# Patient Record
Sex: Male | Born: 1960 | Race: White | Hispanic: No | Smoking: Never smoker
Health system: Southern US, Community
[De-identification: ages and names within clinical notes are randomized; demographics above are authoritative.]

## PROBLEM LIST (undated history)

## (undated) DIAGNOSIS — E1165 Type 2 diabetes mellitus with hyperglycemia: Secondary | ICD-10-CM

## (undated) DIAGNOSIS — I10 Essential (primary) hypertension: Secondary | ICD-10-CM

## (undated) DIAGNOSIS — IMO0002 Reserved for concepts with insufficient information to code with codable children: Secondary | ICD-10-CM

## (undated) DIAGNOSIS — F22 Delusional disorders: Secondary | ICD-10-CM

---

## 2005-07-24 ENCOUNTER — Emergency Department (HOSPITAL_COMMUNITY): Admission: EM | Admit: 2005-07-24 | Discharge: 2005-07-24 | Payer: Self-pay | Admitting: Emergency Medicine

## 2005-07-26 ENCOUNTER — Emergency Department (HOSPITAL_COMMUNITY): Admission: EM | Admit: 2005-07-26 | Discharge: 2005-07-26 | Payer: Self-pay | Admitting: Family Medicine

## 2006-12-01 ENCOUNTER — Emergency Department (HOSPITAL_COMMUNITY): Admission: EM | Admit: 2006-12-01 | Discharge: 2006-12-01 | Payer: Self-pay | Admitting: Emergency Medicine

## 2007-11-29 ENCOUNTER — Emergency Department (HOSPITAL_COMMUNITY): Admission: EM | Admit: 2007-11-29 | Discharge: 2007-11-29 | Payer: Self-pay | Admitting: Emergency Medicine

## 2007-11-29 IMAGING — CR DG ANKLE COMPLETE 3+V*R*
3 series · 3 of 3 positions shown · non-contrast
Comparison: none

This report is delayed due to PACS failure.
CLINICAL DATA: Right ankle pain. No injury.
 RIGHT ANKLE - 3 VIEW:

[view not recorded (1 of 3)]
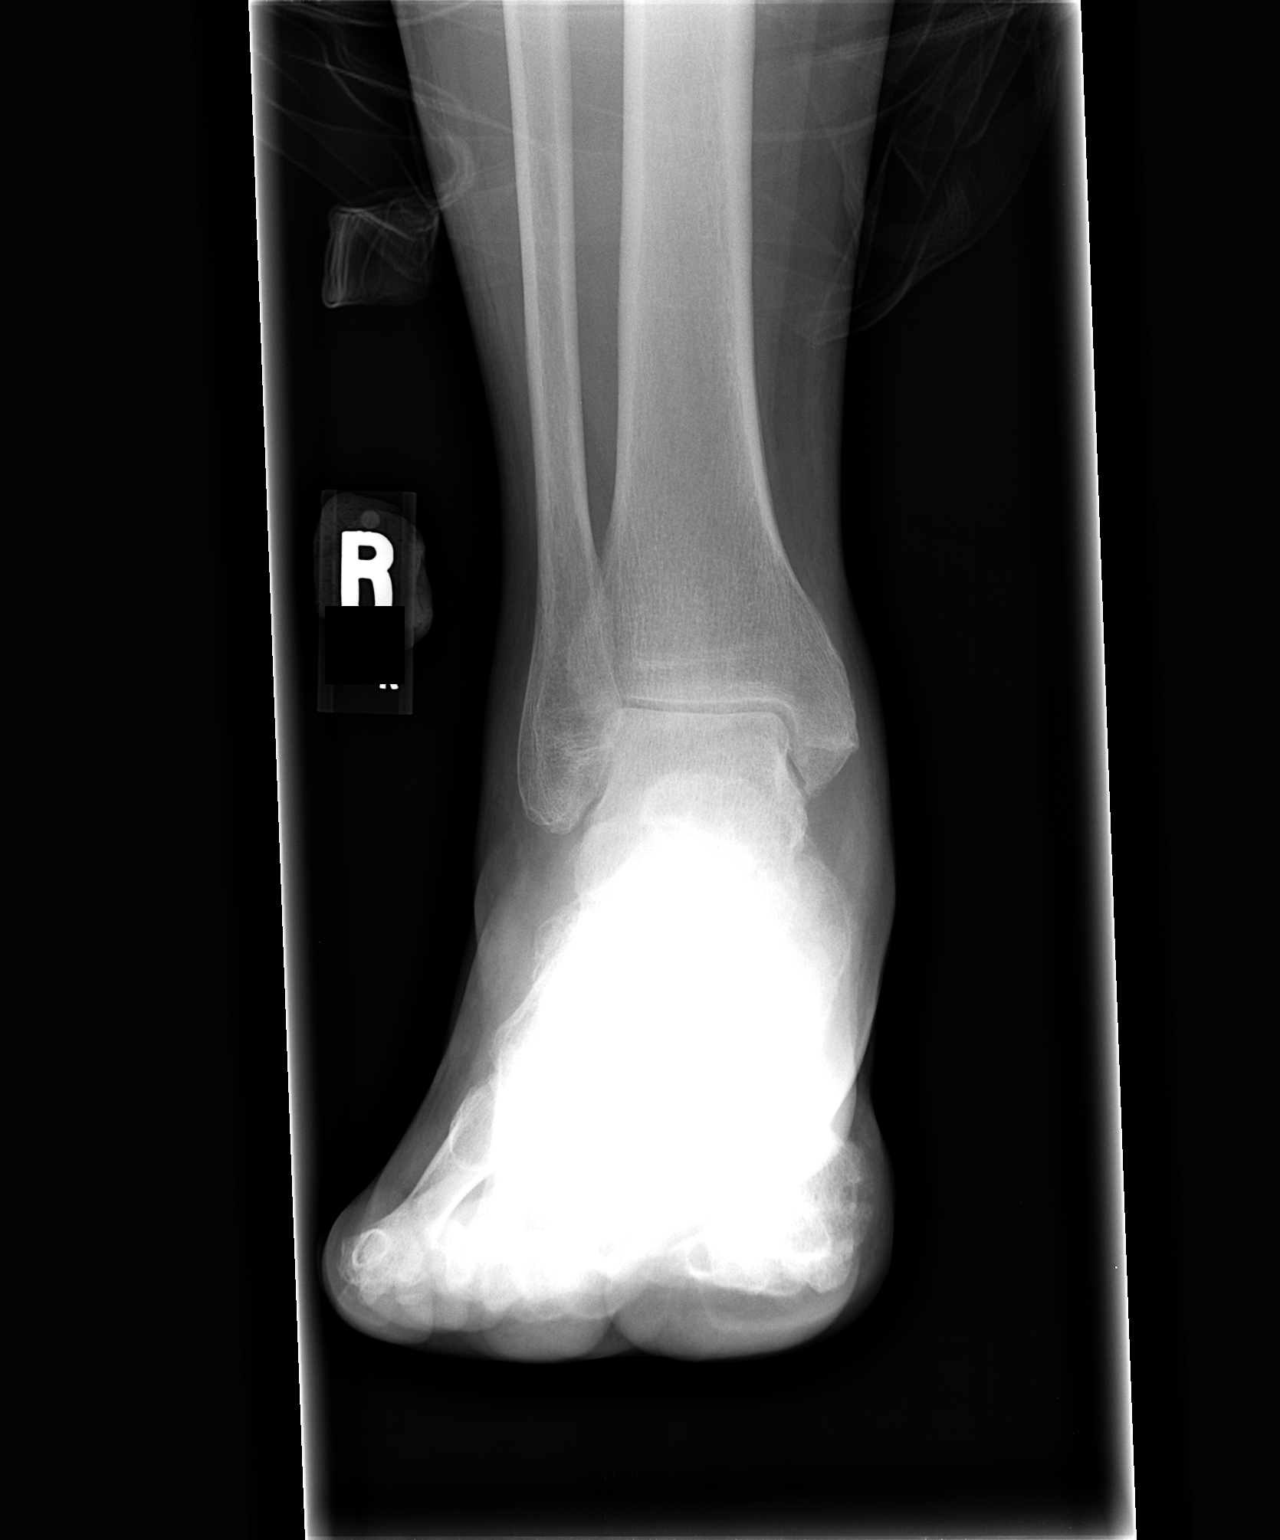

[view not recorded (2 of 3)]
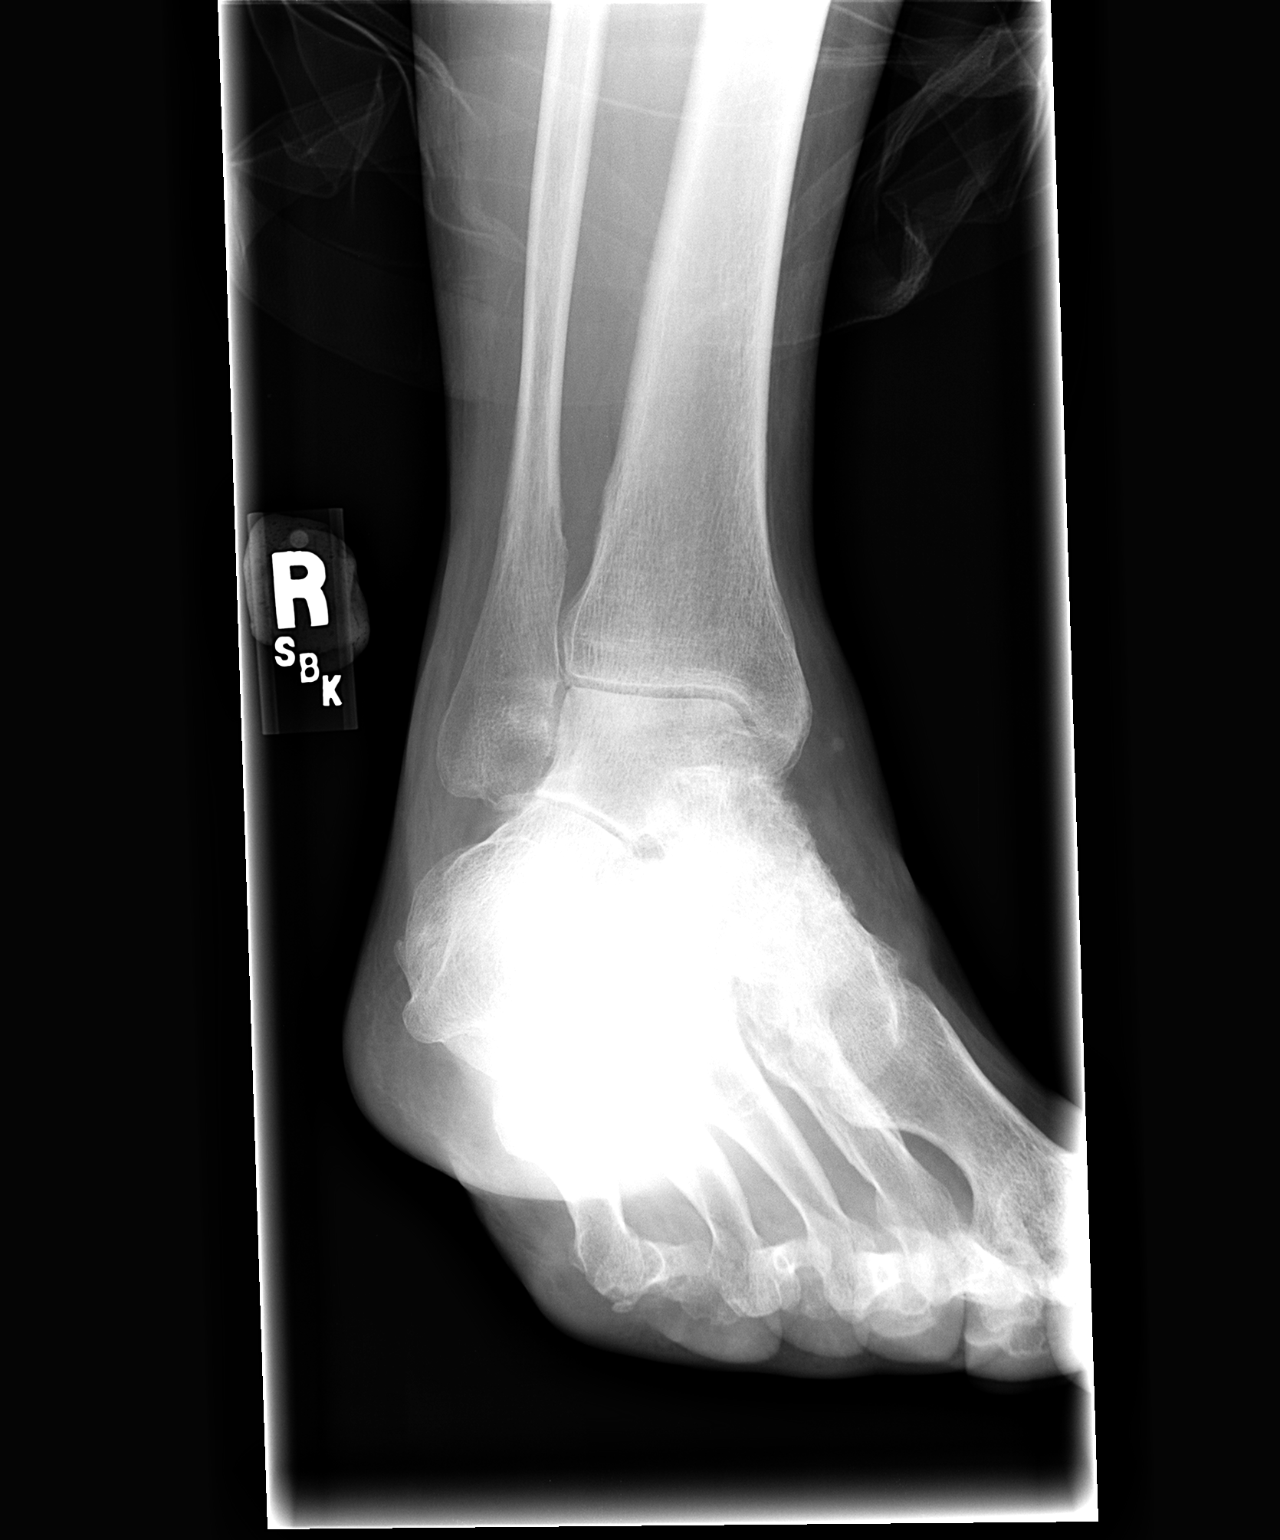

[view not recorded (3 of 3)]
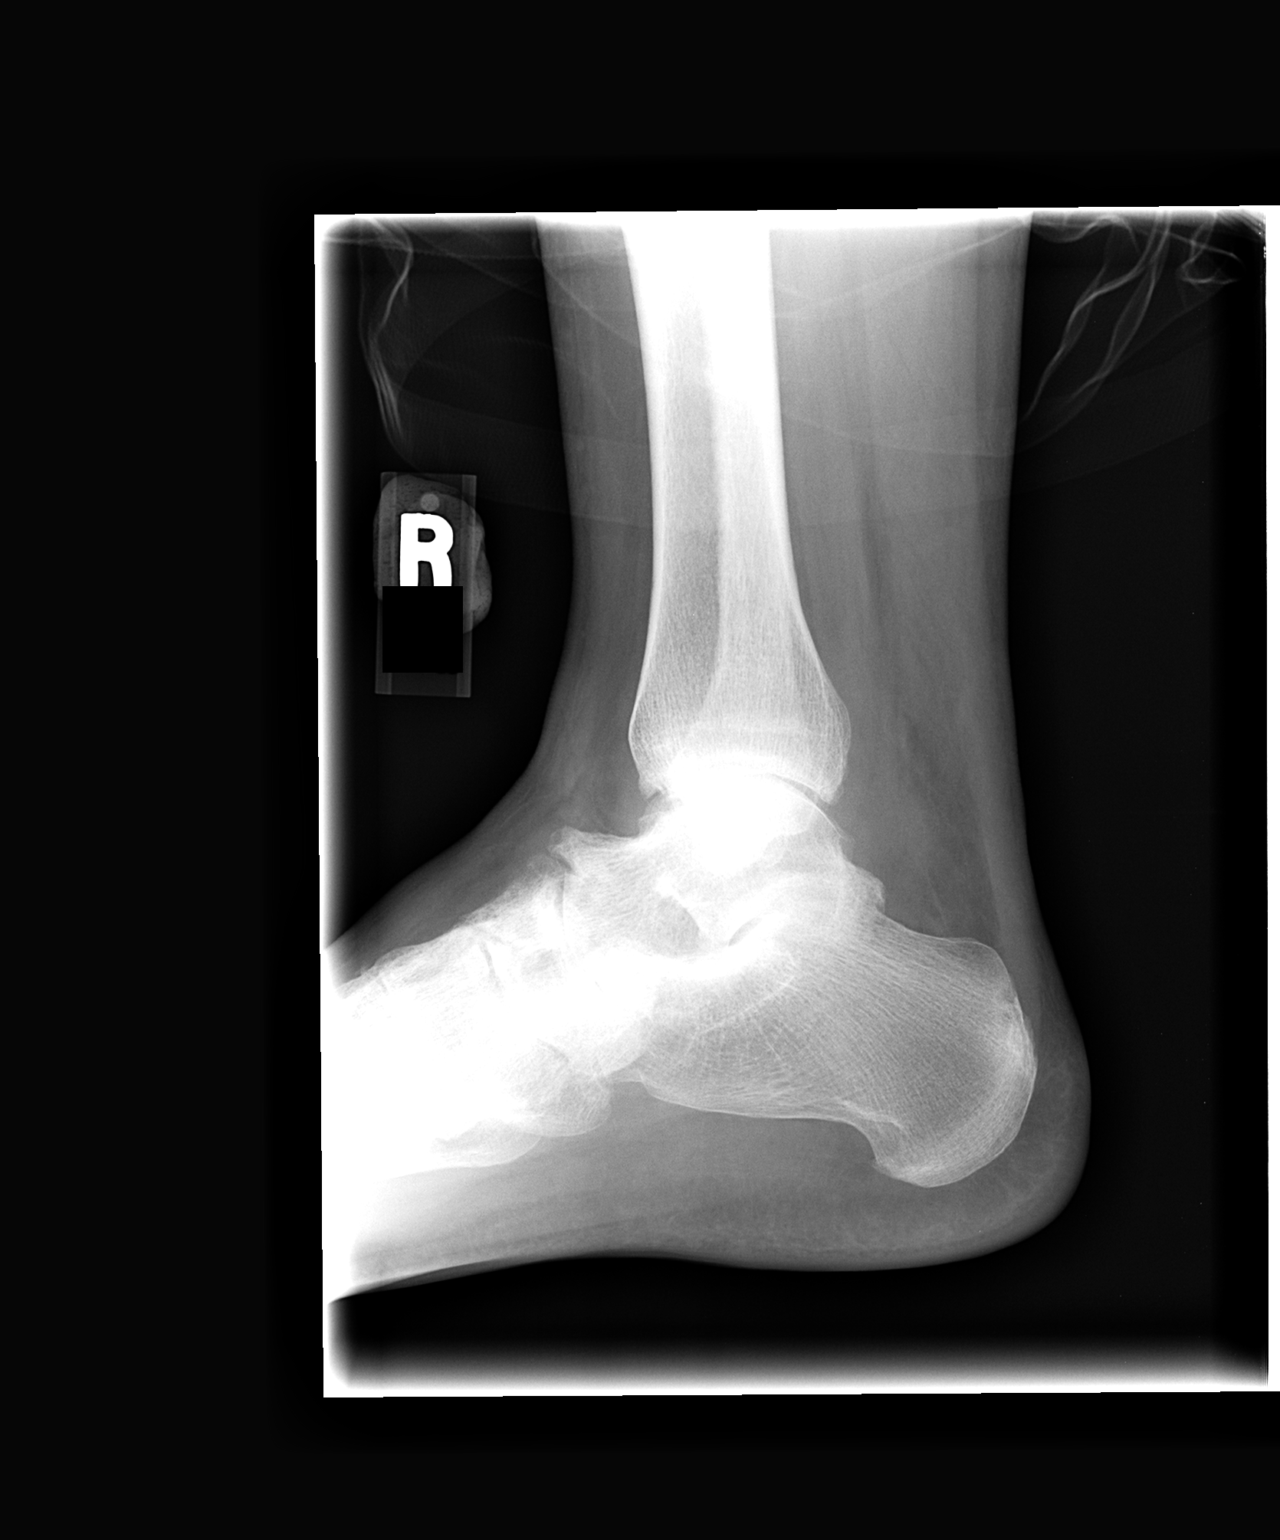

[3 of 3 positions shown; findings below may reference images not displayed]

FINDINGS: Three views of the right ankle were obtained.  The ankle joint appears normal. However, there is considerable degenerative change within the mid foot with spurring at the talonavicular articulation.
IMPRESSION: Degenerative change in mid foot. No acute abnormality.

## 2020-02-01 ENCOUNTER — Inpatient Hospital Stay (HOSPITAL_COMMUNITY): Payer: Non-veteran care

## 2020-02-01 ENCOUNTER — Other Ambulatory Visit: Payer: Self-pay

## 2020-02-01 ENCOUNTER — Emergency Department (HOSPITAL_COMMUNITY): Payer: Non-veteran care

## 2020-02-01 ENCOUNTER — Encounter (HOSPITAL_COMMUNITY): Payer: Self-pay

## 2020-02-01 ENCOUNTER — Inpatient Hospital Stay (HOSPITAL_COMMUNITY)
Admission: EM | Admit: 2020-02-01 | Discharge: 2020-03-01 | DRG: 853 | Disposition: A | Discharge status: Skilled Nursing Facility | Payer: Non-veteran care | Attending: Internal Medicine | Admitting: Internal Medicine

## 2020-02-01 DIAGNOSIS — A4101 Sepsis due to Methicillin susceptible Staphylococcus aureus: Principal | ICD-10-CM | POA: Diagnosis present

## 2020-02-01 DIAGNOSIS — R531 Weakness: Secondary | ICD-10-CM

## 2020-02-01 DIAGNOSIS — R627 Adult failure to thrive: Secondary | ICD-10-CM | POA: Diagnosis present

## 2020-02-01 DIAGNOSIS — S60512A Abrasion of left hand, initial encounter: Secondary | ICD-10-CM | POA: Diagnosis present

## 2020-02-01 DIAGNOSIS — E1142 Type 2 diabetes mellitus with diabetic polyneuropathy: Secondary | ICD-10-CM | POA: Diagnosis present

## 2020-02-01 DIAGNOSIS — N170 Acute kidney failure with tubular necrosis: Secondary | ICD-10-CM | POA: Diagnosis present

## 2020-02-01 DIAGNOSIS — R339 Retention of urine, unspecified: Secondary | ICD-10-CM | POA: Diagnosis not present

## 2020-02-01 DIAGNOSIS — R296 Repeated falls: Secondary | ICD-10-CM | POA: Diagnosis not present

## 2020-02-01 DIAGNOSIS — F40240 Claustrophobia: Secondary | ICD-10-CM | POA: Diagnosis present

## 2020-02-01 DIAGNOSIS — A419 Sepsis, unspecified organism: Secondary | ICD-10-CM | POA: Diagnosis not present

## 2020-02-01 DIAGNOSIS — E43 Unspecified severe protein-calorie malnutrition: Secondary | ICD-10-CM | POA: Diagnosis present

## 2020-02-01 DIAGNOSIS — L97429 Non-pressure chronic ulcer of left heel and midfoot with unspecified severity: Secondary | ICD-10-CM | POA: Diagnosis present

## 2020-02-01 DIAGNOSIS — E871 Hypo-osmolality and hyponatremia: Secondary | ICD-10-CM | POA: Diagnosis present

## 2020-02-01 DIAGNOSIS — N390 Urinary tract infection, site not specified: Secondary | ICD-10-CM | POA: Diagnosis present

## 2020-02-01 DIAGNOSIS — M4642 Discitis, unspecified, cervical region: Secondary | ICD-10-CM | POA: Diagnosis not present

## 2020-02-01 DIAGNOSIS — M20009 Unspecified deformity of unspecified finger(s): Secondary | ICD-10-CM

## 2020-02-01 DIAGNOSIS — Y92009 Unspecified place in unspecified non-institutional (private) residence as the place of occurrence of the external cause: Secondary | ICD-10-CM | POA: Diagnosis not present

## 2020-02-01 DIAGNOSIS — F22 Delusional disorders: Secondary | ICD-10-CM | POA: Diagnosis present

## 2020-02-01 DIAGNOSIS — L89152 Pressure ulcer of sacral region, stage 2: Secondary | ICD-10-CM | POA: Diagnosis not present

## 2020-02-01 DIAGNOSIS — M86172 Other acute osteomyelitis, left ankle and foot: Secondary | ICD-10-CM | POA: Diagnosis present

## 2020-02-01 DIAGNOSIS — R7881 Bacteremia: Secondary | ICD-10-CM | POA: Diagnosis not present

## 2020-02-01 DIAGNOSIS — I700 Atherosclerosis of aorta: Secondary | ICD-10-CM | POA: Diagnosis present

## 2020-02-01 DIAGNOSIS — I081 Rheumatic disorders of both mitral and tricuspid valves: Secondary | ICD-10-CM | POA: Diagnosis present

## 2020-02-01 DIAGNOSIS — M4802 Spinal stenosis, cervical region: Secondary | ICD-10-CM | POA: Diagnosis present

## 2020-02-01 DIAGNOSIS — M6008 Infective myositis, other site: Secondary | ICD-10-CM | POA: Diagnosis present

## 2020-02-01 DIAGNOSIS — D509 Iron deficiency anemia, unspecified: Secondary | ICD-10-CM | POA: Diagnosis present

## 2020-02-01 DIAGNOSIS — L97419 Non-pressure chronic ulcer of right heel and midfoot with unspecified severity: Secondary | ICD-10-CM | POA: Diagnosis present

## 2020-02-01 DIAGNOSIS — Z9089 Acquired absence of other organs: Secondary | ICD-10-CM

## 2020-02-01 DIAGNOSIS — Z20822 Contact with and (suspected) exposure to covid-19: Secondary | ICD-10-CM | POA: Diagnosis present

## 2020-02-01 DIAGNOSIS — N289 Disorder of kidney and ureter, unspecified: Secondary | ICD-10-CM | POA: Diagnosis not present

## 2020-02-01 DIAGNOSIS — Z751 Person awaiting admission to adequate facility elsewhere: Secondary | ICD-10-CM

## 2020-02-01 DIAGNOSIS — I951 Orthostatic hypotension: Secondary | ICD-10-CM | POA: Diagnosis not present

## 2020-02-01 DIAGNOSIS — R413 Other amnesia: Secondary | ICD-10-CM | POA: Diagnosis present

## 2020-02-01 DIAGNOSIS — M86171 Other acute osteomyelitis, right ankle and foot: Secondary | ICD-10-CM | POA: Diagnosis present

## 2020-02-01 DIAGNOSIS — G629 Polyneuropathy, unspecified: Secondary | ICD-10-CM

## 2020-02-01 DIAGNOSIS — Z794 Long term (current) use of insulin: Secondary | ICD-10-CM | POA: Diagnosis not present

## 2020-02-01 DIAGNOSIS — R509 Fever, unspecified: Secondary | ICD-10-CM | POA: Diagnosis present

## 2020-02-01 DIAGNOSIS — R7989 Other specified abnormal findings of blood chemistry: Secondary | ICD-10-CM

## 2020-02-01 DIAGNOSIS — M25511 Pain in right shoulder: Secondary | ICD-10-CM | POA: Diagnosis present

## 2020-02-01 DIAGNOSIS — G061 Intraspinal abscess and granuloma: Secondary | ICD-10-CM | POA: Diagnosis present

## 2020-02-01 DIAGNOSIS — E11621 Type 2 diabetes mellitus with foot ulcer: Secondary | ICD-10-CM | POA: Diagnosis present

## 2020-02-01 DIAGNOSIS — K8020 Calculus of gallbladder without cholecystitis without obstruction: Secondary | ICD-10-CM | POA: Diagnosis present

## 2020-02-01 DIAGNOSIS — W19XXXA Unspecified fall, initial encounter: Secondary | ICD-10-CM | POA: Diagnosis present

## 2020-02-01 DIAGNOSIS — R52 Pain, unspecified: Secondary | ICD-10-CM

## 2020-02-01 DIAGNOSIS — N179 Acute kidney failure, unspecified: Secondary | ICD-10-CM | POA: Diagnosis not present

## 2020-02-01 DIAGNOSIS — L97529 Non-pressure chronic ulcer of other part of left foot with unspecified severity: Secondary | ICD-10-CM | POA: Diagnosis present

## 2020-02-01 DIAGNOSIS — Q211 Atrial septal defect: Secondary | ICD-10-CM | POA: Diagnosis not present

## 2020-02-01 DIAGNOSIS — M25519 Pain in unspecified shoulder: Secondary | ICD-10-CM

## 2020-02-01 DIAGNOSIS — G319 Degenerative disease of nervous system, unspecified: Secondary | ICD-10-CM | POA: Diagnosis present

## 2020-02-01 DIAGNOSIS — B9561 Methicillin susceptible Staphylococcus aureus infection as the cause of diseases classified elsewhere: Secondary | ICD-10-CM | POA: Diagnosis not present

## 2020-02-01 DIAGNOSIS — Z833 Family history of diabetes mellitus: Secondary | ICD-10-CM

## 2020-02-01 DIAGNOSIS — R278 Other lack of coordination: Secondary | ICD-10-CM

## 2020-02-01 DIAGNOSIS — E1165 Type 2 diabetes mellitus with hyperglycemia: Secondary | ICD-10-CM | POA: Diagnosis present

## 2020-02-01 DIAGNOSIS — E1169 Type 2 diabetes mellitus with other specified complication: Secondary | ICD-10-CM | POA: Diagnosis present

## 2020-02-01 DIAGNOSIS — Z9181 History of falling: Secondary | ICD-10-CM

## 2020-02-01 DIAGNOSIS — R194 Change in bowel habit: Secondary | ICD-10-CM | POA: Diagnosis not present

## 2020-02-01 DIAGNOSIS — Z885 Allergy status to narcotic agent status: Secondary | ICD-10-CM

## 2020-02-01 DIAGNOSIS — E118 Type 2 diabetes mellitus with unspecified complications: Secondary | ICD-10-CM

## 2020-02-01 DIAGNOSIS — G252 Other specified forms of tremor: Secondary | ICD-10-CM | POA: Diagnosis present

## 2020-02-01 DIAGNOSIS — Z6826 Body mass index (BMI) 26.0-26.9, adult: Secondary | ICD-10-CM

## 2020-02-01 HISTORY — DX: Essential (primary) hypertension: I10

## 2020-02-01 HISTORY — DX: Reserved for concepts with insufficient information to code with codable children: IMO0002

## 2020-02-01 HISTORY — DX: Type 2 diabetes mellitus with hyperglycemia: E11.65

## 2020-02-01 HISTORY — DX: Delusional disorders: F22

## 2020-02-01 LAB — CBG MONITORING, ED
Glucose-Capillary: 298 mg/dL — ABNORMAL HIGH (ref 70–99)
Glucose-Capillary: 276 mg/dL — ABNORMAL HIGH (ref 70–99)
Glucose-Capillary: 232 mg/dL — ABNORMAL HIGH (ref 70–99)

## 2020-02-01 LAB — BASIC METABOLIC PANEL
Anion gap: 16 — ABNORMAL HIGH (ref 5–15)
BUN: 42 mg/dL — ABNORMAL HIGH (ref 6–20)
CO2: 19 mmol/L — ABNORMAL LOW (ref 22–32)
Calcium: 8.6 mg/dL — ABNORMAL LOW (ref 8.9–10.3)
Chloride: 85 mmol/L — ABNORMAL LOW (ref 98–111)
Creatinine, Ser: 2.33 mg/dL — ABNORMAL HIGH (ref 0.61–1.24)
GFR calc Af Amer: 35 mL/min — ABNORMAL LOW (ref >60)
GFR calc non Af Amer: 30 mL/min — ABNORMAL LOW (ref >60)
Glucose, Bld: 316 mg/dL — ABNORMAL HIGH (ref 70–99)
Potassium: 4.2 mmol/L (ref 3.5–5.1)
Sodium: 120 mmol/L — ABNORMAL LOW (ref 135–145)

## 2020-02-01 LAB — CBC
HCT: 36.5 % — ABNORMAL LOW (ref 39.0–52.0)
Hemoglobin: 11.7 g/dL — ABNORMAL LOW (ref 13.0–17.0)
MCH: 22.6 pg — ABNORMAL LOW (ref 26.0–34.0)
MCHC: 32.1 g/dL (ref 30.0–36.0)
MCV: 70.5 fL — ABNORMAL LOW (ref 80.0–100.0)
Platelets: 441 10*3/uL — ABNORMAL HIGH (ref 150–400)
RBC: 5.18 MIL/uL (ref 4.22–5.81)
RDW: 14.7 % (ref 11.5–15.5)
WBC: 24.6 10*3/uL — ABNORMAL HIGH (ref 4.0–10.5)
nRBC: 0 % (ref 0.0–0.2)

## 2020-02-01 LAB — FOLATE: Folate: 15.3 ng/mL (ref >5.9)

## 2020-02-01 LAB — HEPATIC FUNCTION PANEL
ALT: 109 U/L — ABNORMAL HIGH (ref 0–44)
AST: 75 U/L — ABNORMAL HIGH (ref 15–41)
Albumin: 2.3 g/dL — ABNORMAL LOW (ref 3.5–5.0)
Alkaline Phosphatase: 317 U/L — ABNORMAL HIGH (ref 38–126)
Bilirubin, Direct: 0.3 mg/dL — ABNORMAL HIGH (ref 0.0–0.2)
Indirect Bilirubin: 0.9 mg/dL (ref 0.3–0.9)
Total Bilirubin: 1.2 mg/dL (ref 0.3–1.2)
Total Protein: 8.2 g/dL — ABNORMAL HIGH (ref 6.5–8.1)

## 2020-02-01 LAB — URINALYSIS, ROUTINE W REFLEX MICROSCOPIC
Bilirubin Urine: NEGATIVE
Glucose, UA: 150 mg/dL — AB
Ketones, ur: NEGATIVE mg/dL
Nitrite: NEGATIVE
Protein, ur: 100 mg/dL — AB
Specific Gravity, Urine: 1.014 (ref 1.005–1.030)
pH: 5 (ref 5.0–8.0)

## 2020-02-01 LAB — DIFFERENTIAL
Abs Immature Granulocytes: 0.26 10*3/uL — ABNORMAL HIGH (ref 0.00–0.07)
Basophils Absolute: 0.1 10*3/uL (ref 0.0–0.1)
Basophils Relative: 0 %
Eosinophils Absolute: 0 10*3/uL (ref 0.0–0.5)
Eosinophils Relative: 0 %
Immature Granulocytes: 1 %
Lymphocytes Relative: 3 %
Lymphs Abs: 0.7 10*3/uL (ref 0.7–4.0)
Monocytes Absolute: 1.6 10*3/uL — ABNORMAL HIGH (ref 0.1–1.0)
Monocytes Relative: 7 %
Neutro Abs: 19.3 10*3/uL — ABNORMAL HIGH (ref 1.7–7.7)
Neutrophils Relative %: 89 %

## 2020-02-01 LAB — HEPATITIS PANEL, ACUTE
HCV Ab: NONREACTIVE
Hep A IgM: NONREACTIVE
Hep B C IgM: NONREACTIVE
Hepatitis B Surface Ag: NONREACTIVE

## 2020-02-01 LAB — RAPID URINE DRUG SCREEN, HOSP PERFORMED
Amphetamines: NOT DETECTED
Barbiturates: NOT DETECTED
Benzodiazepines: NOT DETECTED
Cocaine: NOT DETECTED
Opiates: NOT DETECTED
Tetrahydrocannabinol: NOT DETECTED

## 2020-02-01 LAB — TSH: TSH: 1.216 u[IU]/mL (ref 0.350–4.500)

## 2020-02-01 LAB — HIV ANTIBODY (ROUTINE TESTING W REFLEX): HIV Screen 4th Generation wRfx: NONREACTIVE

## 2020-02-01 LAB — CK: Total CK: 179 U/L (ref 49–397)

## 2020-02-01 LAB — PROCALCITONIN: Procalcitonin: 2.66 ng/mL

## 2020-02-01 LAB — PROTIME-INR
INR: 1.1 (ref 0.8–1.2)
Prothrombin Time: 14.2 seconds (ref 11.4–15.2)

## 2020-02-01 LAB — LACTIC ACID, PLASMA: Lactic Acid, Venous: 1.8 mmol/L (ref 0.5–1.9)

## 2020-02-01 LAB — HEMOGLOBIN A1C
Hgb A1c MFr Bld: 11.3 % — ABNORMAL HIGH (ref 4.8–5.6)
Mean Plasma Glucose: 277.61 mg/dL

## 2020-02-01 LAB — APTT: aPTT: 33 seconds (ref 24–36)

## 2020-02-01 LAB — GLUCOSE, CAPILLARY: Glucose-Capillary: 338 mg/dL — ABNORMAL HIGH (ref 70–99)

## 2020-02-01 LAB — POC SARS CORONAVIRUS 2 AG -  ED: SARS Coronavirus 2 Ag: NEGATIVE

## 2020-02-01 LAB — SARS CORONAVIRUS 2 (TAT 6-24 HRS): SARS Coronavirus 2: NEGATIVE

## 2020-02-01 LAB — VITAMIN B12: Vitamin B-12: 342 pg/mL (ref 180–914)

## 2020-02-01 IMAGING — MR MR CERVICAL SPINE W/O CM
5 series · 34 of 48 positions shown · non-contrast
Comparison: None.

CLINICAL DATA: Generalized weakness, gait instability, and multiple
falls.

EXAM:
MRI HEAD WITHOUT CONTRAST
MRI CERVICAL SPINE WITHOUT CONTRAST
TECHNIQUE: Multiplanar, multiecho pulse sequences of the brain and surrounding
structures, and cervical spine, to include the craniocervical
junction and cervicothoracic junction, were obtained without
intravenous contrast.

[Series 5: T2 · sagittal · 3.0mm · 0.69mm/px · 6 of 15 slices shown (1 of 2)]
[im 1/15]
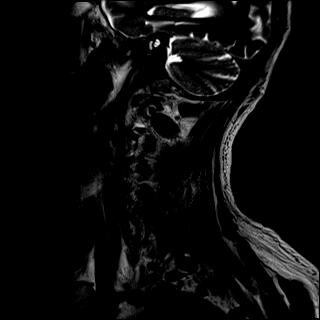
[im 3/15]
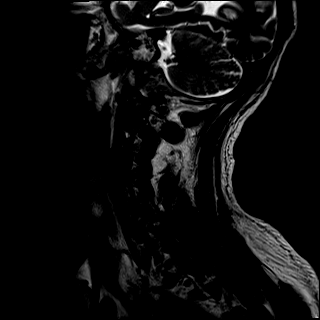
[im 6/15]
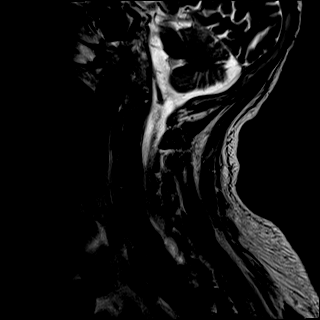
[im 9/15]
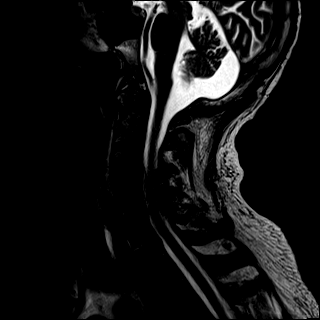
[im 12/15]
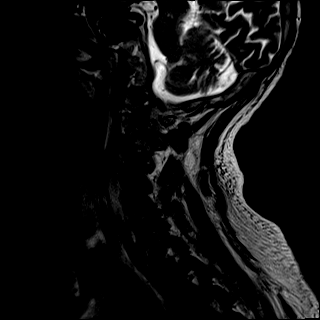
[im 15/15]
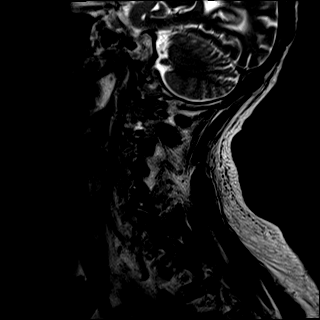

[Series 7: STIR · sagittal · 3.0mm · 0.86mm/px · 6 of 15 slices shown]
[im 1/15]
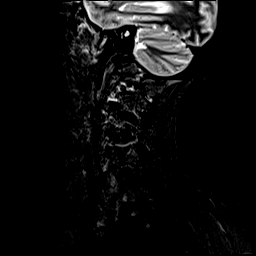
[im 3/15]
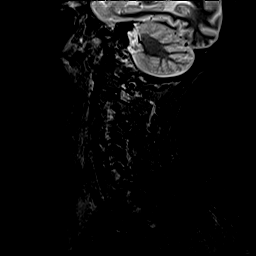
[im 6/15]
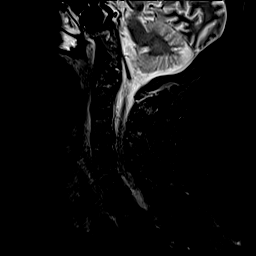
[im 9/15]
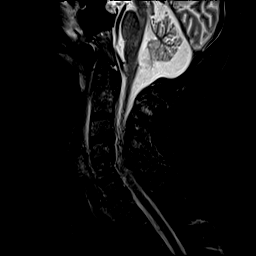
[im 12/15]
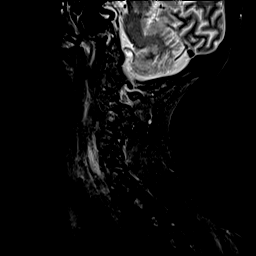
[im 15/15]
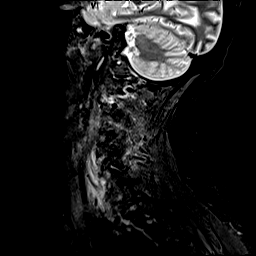

[Series 8: T1 · sagittal · 3.0mm · 0.69mm/px · 6 of 15 slices shown]
[im 1/15]
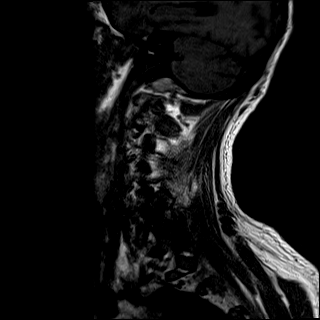
[im 3/15]
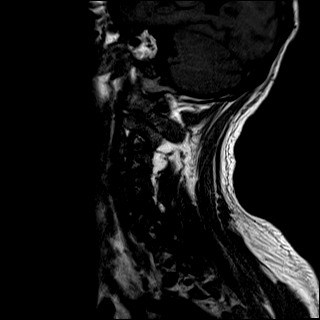
[im 6/15]
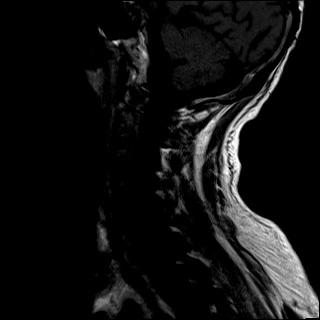
[im 9/15]
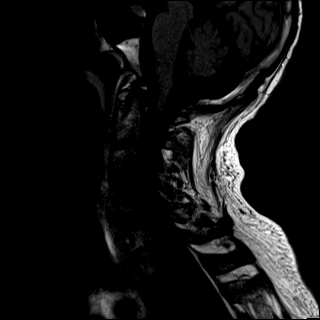
[im 12/15]
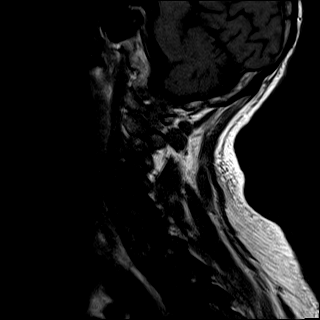
[im 15/15]
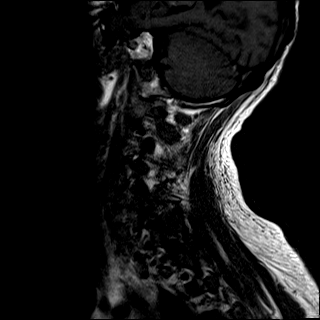

[Series 9: T2 · axial · 3.0mm · 0.66mm/px · z∈[-245,-125]mm · 9 of 40 slices shown (2 of 2)]
[im 1/40]
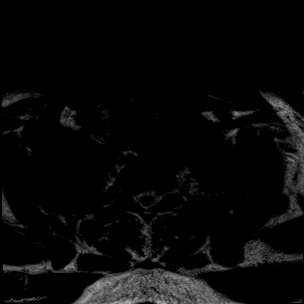
[im 6/40]
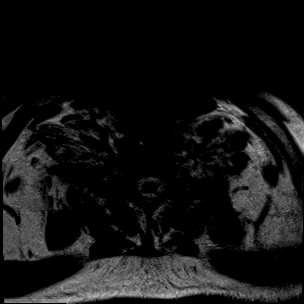
[im 12/40]
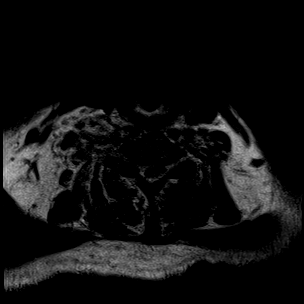
[im 17/40]
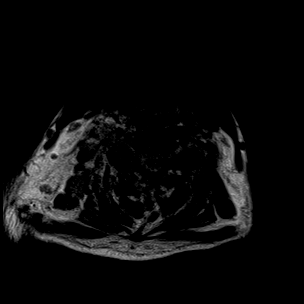
[im 20/40]
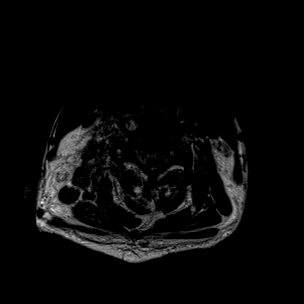
[im 23/40]
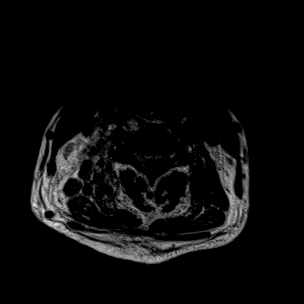
[im 28/40]
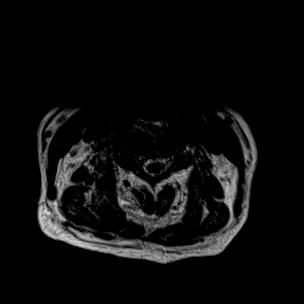
[im 34/40]
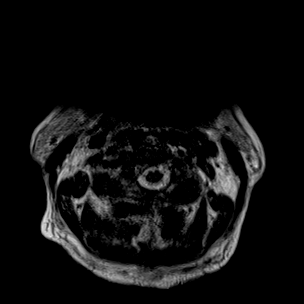
[im 40/40]
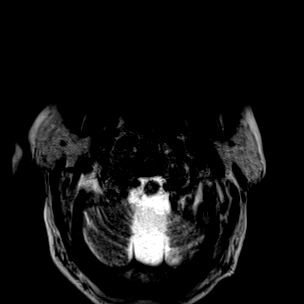

[Series 10: GRE · axial · 3.0mm · 0.39mm/px · z∈[-245,-143]mm · 7 of 40 slices shown]
[im 1/40]
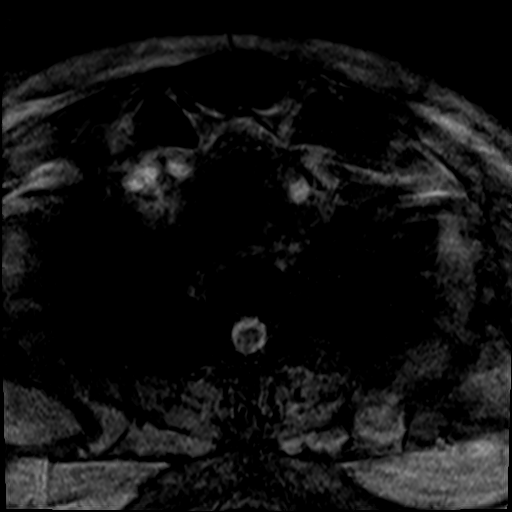
[im 6/40]
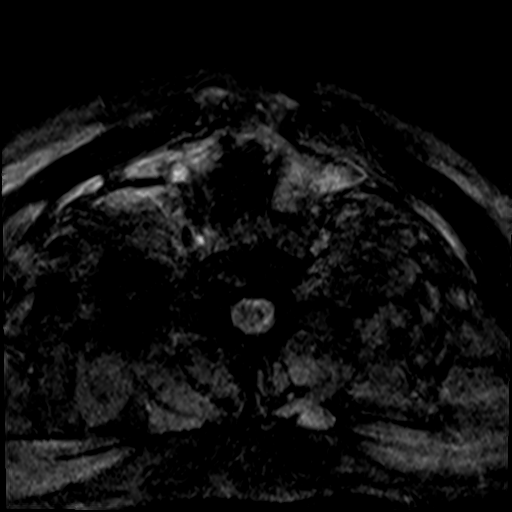
[im 12/40]
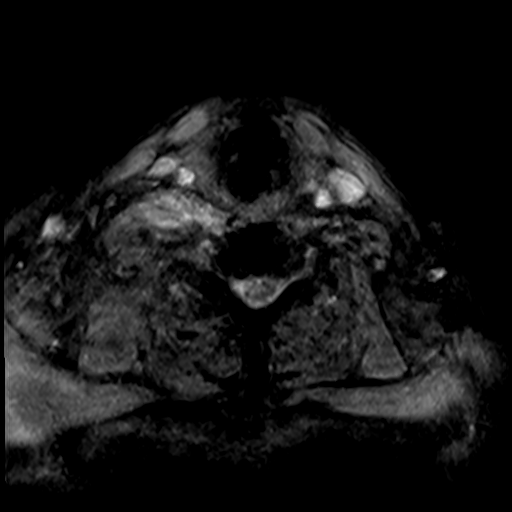
[im 17/40]
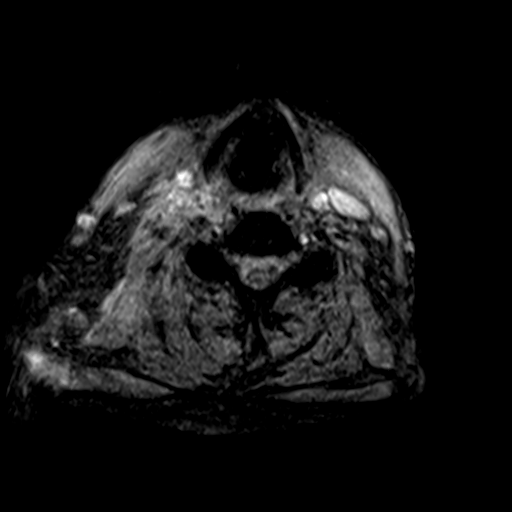
[im 23/40]
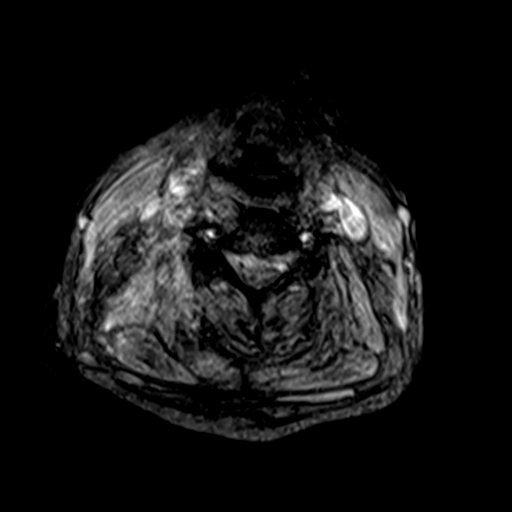
[im 28/40]
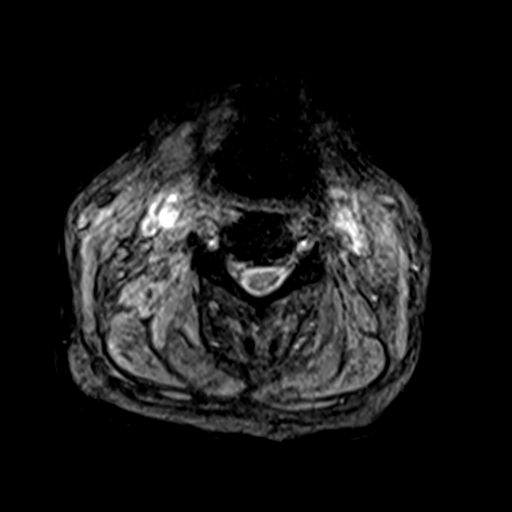
[im 34/40]
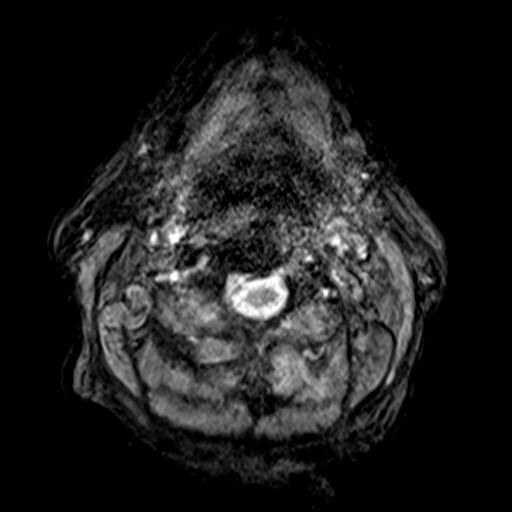

[34 of 48 positions shown; findings below may reference images not displayed]

FINDINGS: MRI HEAD FINDINGS

The study is mildly motion degraded.

Brain: There is no evidence of acute infarct, intracranial
hemorrhage, mass, midline shift, or extra-axial fluid collection.
There is mild cerebral atrophy. Periventricular white matter T2
hyperintensities are nonspecific but compatible with minimal chronic
small vessel ischemic disease.

Vascular: Major intracranial vascular flow voids are preserved.

Skull and upper cervical spine: No suspicious marrow lesion.

Sinuses/Orbits: Unremarkable orbits. Minimal left ethmoid air cell
mucosal thickening. At most trace right mastoid fluid.

Other: None.

MRI CERVICAL SPINE FINDINGS

The study is motion degraded throughout including severe motion on
the axial sequences.

Alignment: No significant listhesis.

Vertebrae: No fracture or suspicious marrow lesion. Chronic
degenerative endplate changes at C6-7 associated with severe disc
space narrowing. Moderate disc space narrowing at C4-5. No discal
fluid signal or frank vertebral marrow edema to indicate discitis or
osteomyelitis. Small right facet joint effusion at C5-6 without
substantial facet marrow edema.

Cord: No cord signal abnormality identified within limitations of
motion artifact.

Posterior Fossa, vertebral arteries, paraspinal tissues: There is
prevertebral edema, and there are apparent fluid collections along
the right anterolateral aspects of the C3-C5 vertebral bodies
measuring up to approximately 4 cm in craniocaudal length and with a
maximal transverse diameter of approximately 1.3 cm. There is a
diffusely edematous appearance of the right scalene musculature more
inferiorly in the neck. The vertebral artery flow voids are grossly
preserved bilaterally.

Disc levels: A small ventral epidural fluid collection is suspected
asymmetrically to the right of midline extending from the C3
inferior to C5 superior endplate levels (for example series 5, image
7 and series 10, image 22). This measures 2-2.5 cm in craniocaudal
length and less than 1 cm in transverse dimension and contributes to
mild spinal stenosis at these levels without frank cord compression.
Detailed assessment of degenerative changes is limited by motion,
however there is no significant spinal stenosis elsewhere. Disc
bulging and uncovertebral spurring result in neural foraminal
stenosis which is likely moderate bilaterally at C3-4, severe on the
right and moderate to severe on the left at C4-5, mild on the right
at C5-6, and moderate on the right at C6-7.
IMPRESSION: MRI HEAD:

1. No acute intracranial abnormality.
2. Mild cerebral atrophy and chronic small vessel ischemia.

MRI CERVICAL SPINE:

1. Severely motion degraded examination.
2. Prevertebral edema with right-sided paravertebral fluid
collections in the mid to lower cervical spine concerning for
infection and abscesses with suspected myositis involving the
right-sided scalene musculature.
3. Suspected small ventral epidural abscess or phlegmon from C3-C5
with mild spinal stenosis. No cord compression.
4. No definite evidence of discitis or osteomyelitis.
5. Small volume facet joint fluid on the right at C5-6 is favored to
be degenerative although septic arthritis is not excluded.

These results will be called to the ordering clinician or
representative by the Radiologist Assistant, and communication
documented in the PACS or zVision Dashboard.

## 2020-02-01 IMAGING — DX DG CHEST 1V PORT
1 series · 1 of 1 positions shown · non-contrast
Comparison: None

CLINICAL DATA: Generalized weakness, shortness of breath

EXAM:
PORTABLE CHEST 1 VIEW

[chest]
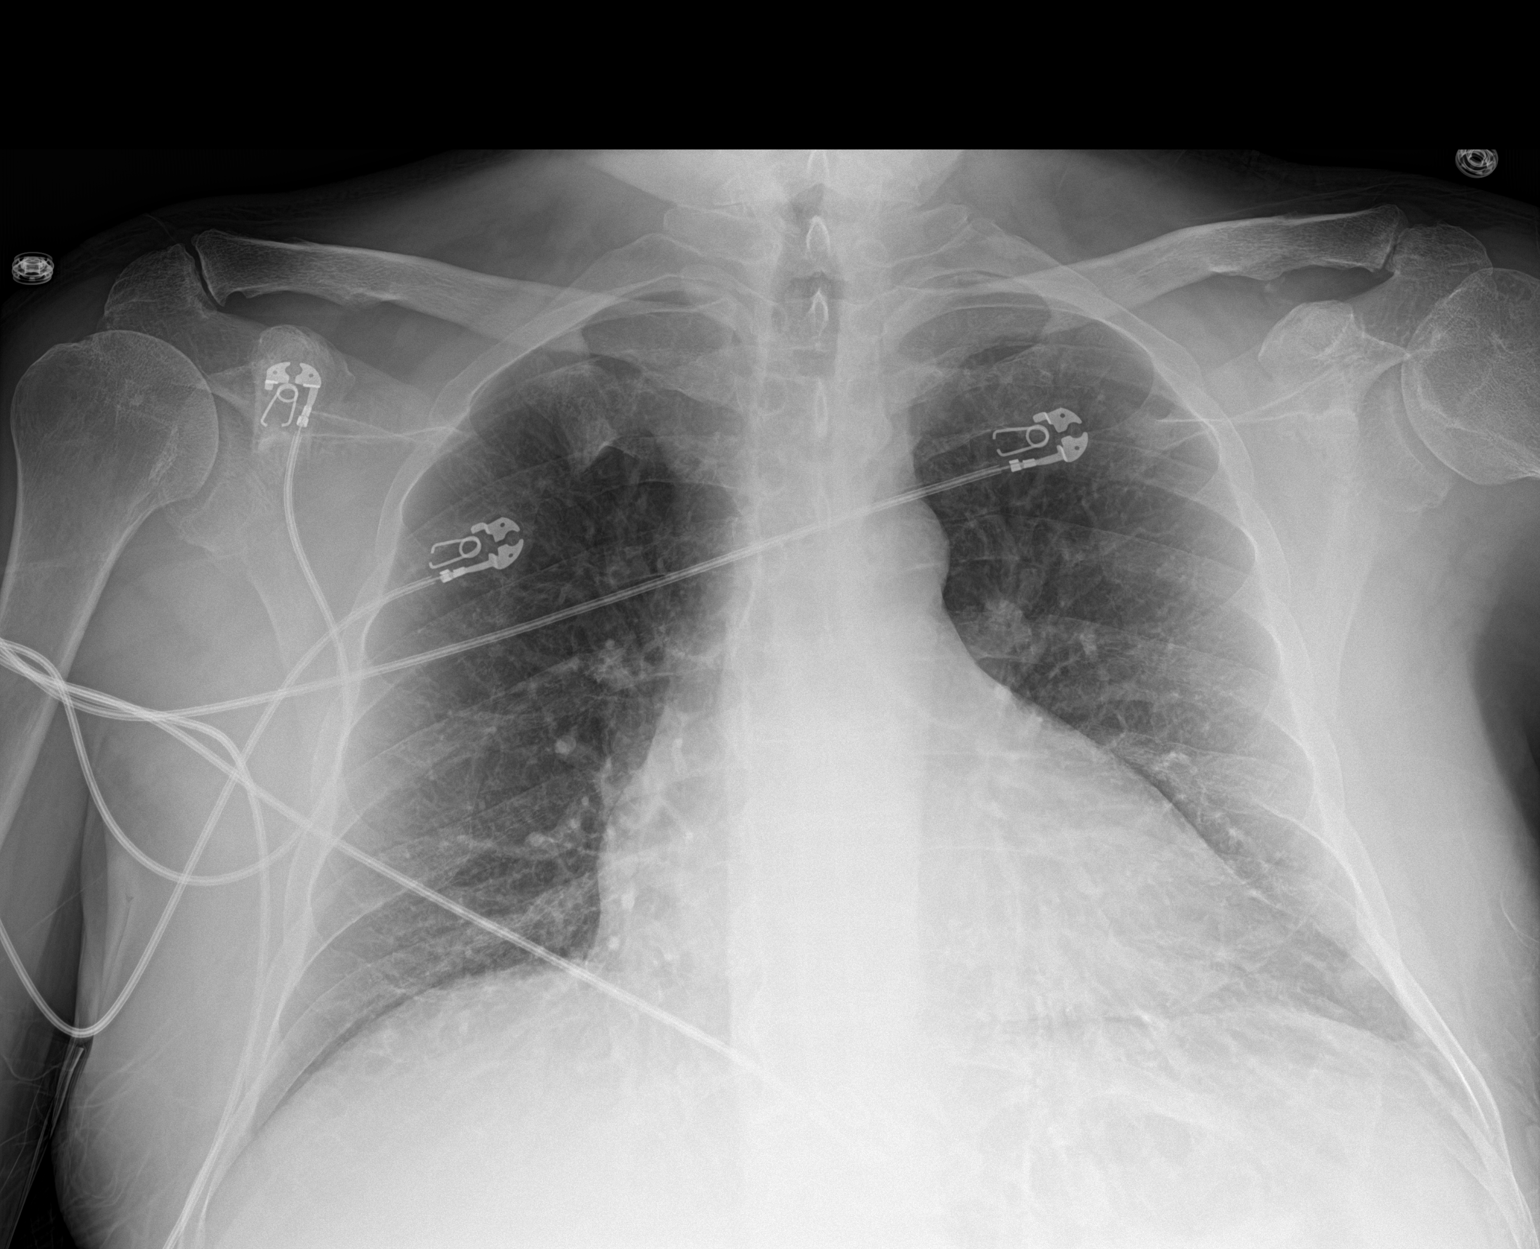

[1 of 1 positions shown; findings below may reference images not displayed]

FINDINGS: Heart is normal size. Lingular scarring. Right lung clear. No
effusions or acute bony abnormality.
IMPRESSION: No active disease.

## 2020-02-01 IMAGING — MR MR FOOT*L* W/O CM
5 series · 40 of 40 positions shown · non-contrast
Comparison: None.

CLINICAL DATA: Osteomyelitis of the foot

EXAM:
MRI OF THE LEFT FOOT WITHOUT CONTRAST
TECHNIQUE: Multiplanar, multisequence MR imaging of the left was performed. No
intravenous contrast was administered.

[Series 4: T1 · coronal · right · 3.0mm · 0.47mm/px · 10 of 48 slices shown (1 of 2)]
[im 1/48]
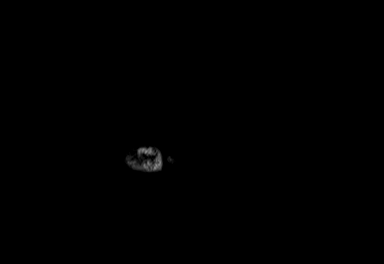
[im 6/48]
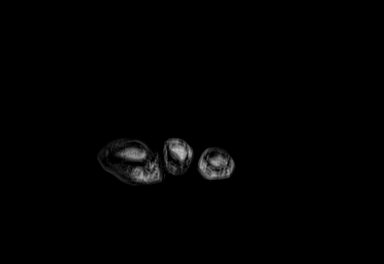
[im 11/48]
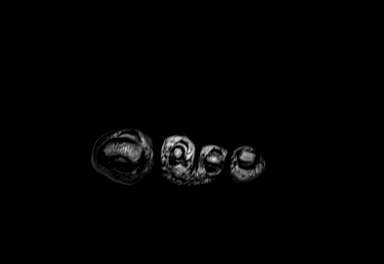
[im 16/48]
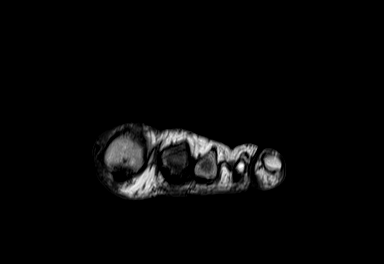
[im 21/48]
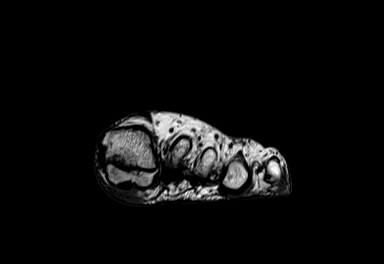
[im 27/48]
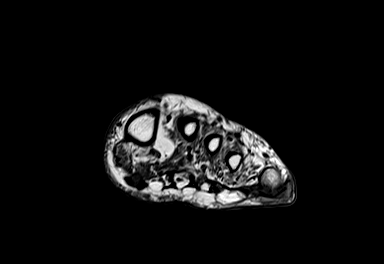
[im 32/48]
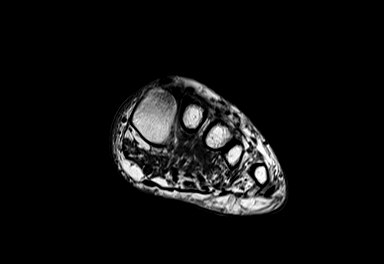
[im 37/48]
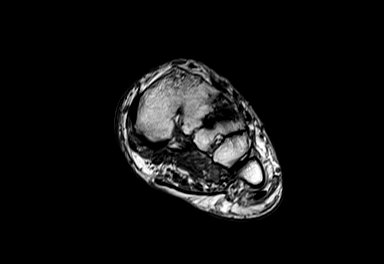
[im 42/48]
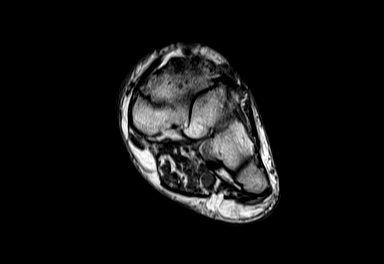
[im 48/48]
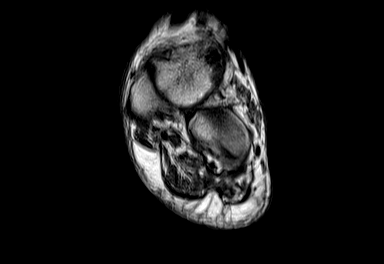

[Series 5: T2 fat-sat · coronal · right · 3.0mm · 0.49mm/px · 10 of 48 slices shown (1 of 2)]
[im 1/48]
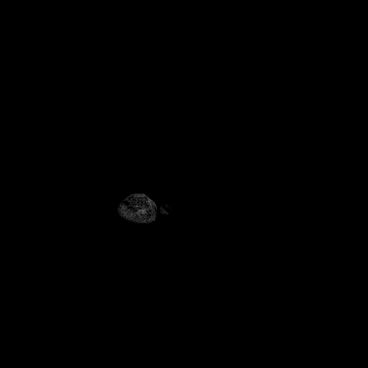
[im 6/48]
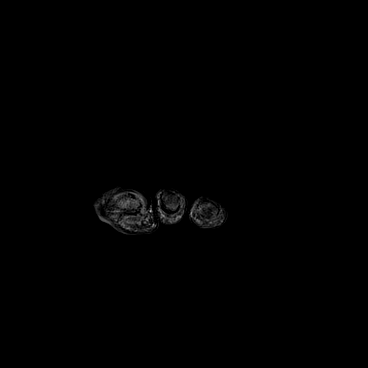
[im 11/48]
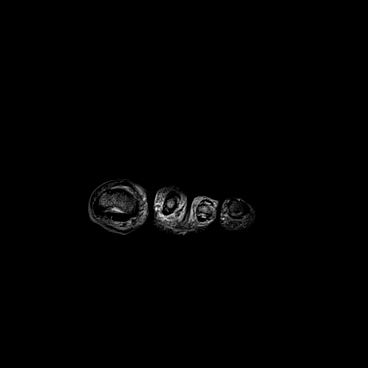
[im 16/48]
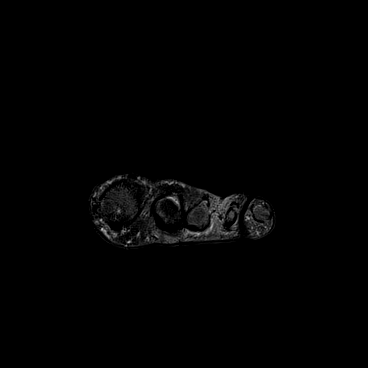
[im 21/48]
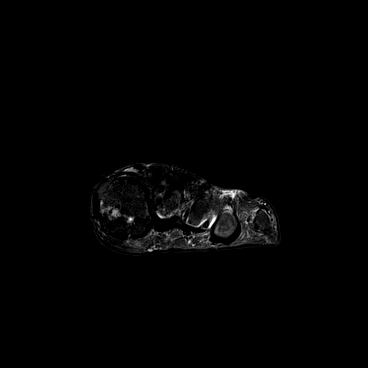
[im 27/48]
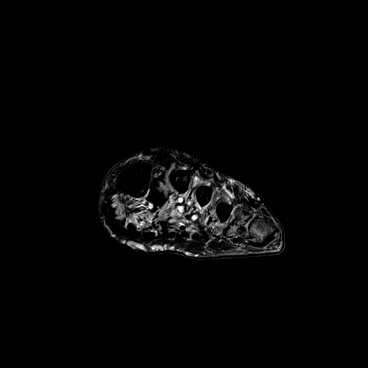
[im 32/48]
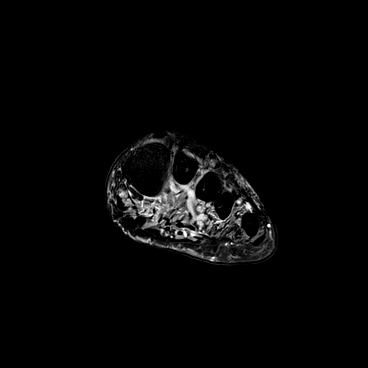
[im 37/48]
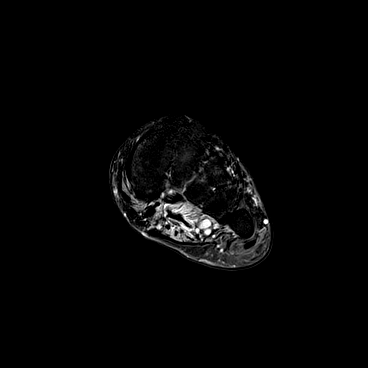
[im 42/48]
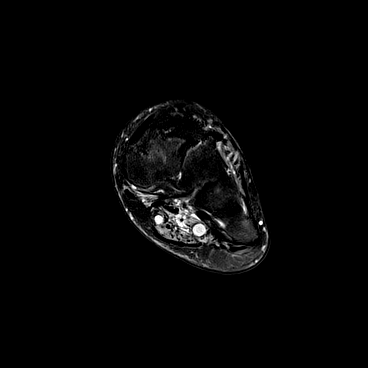
[im 48/48]
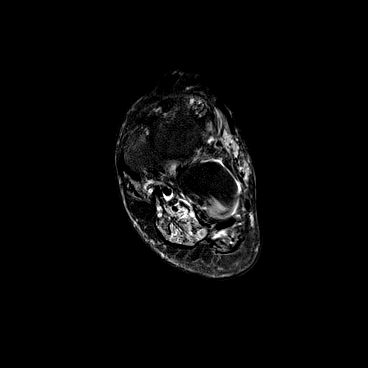

[Series 6: T1 · axial · right · 3.0mm · 0.57mm/px · z∈[-75,+7]mm · 7 of 32 slices shown (2 of 2)]
[im 1/32]
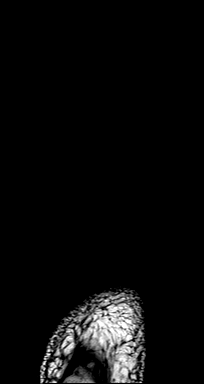
[im 6/32]
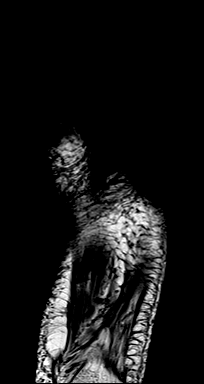
[im 11/32]
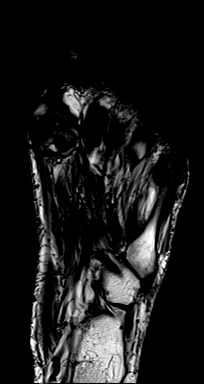
[im 16/32]
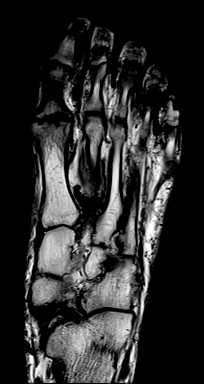
[im 21/32]
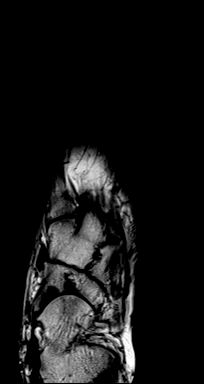
[im 26/32]
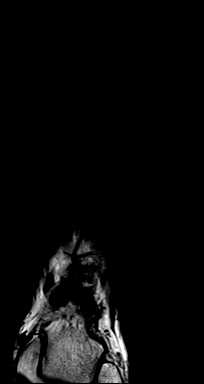
[im 32/32]
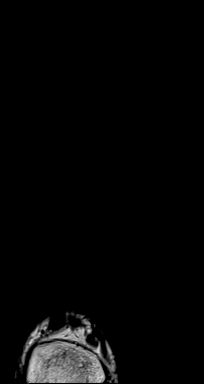

[Series 7: T2 fat-sat · axial · right · 3.0mm · 0.65mm/px · z∈[-75,+7]mm · 7 of 32 slices shown (2 of 2)]
[im 1/32]
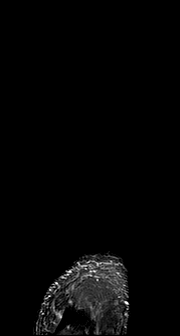
[im 6/32]
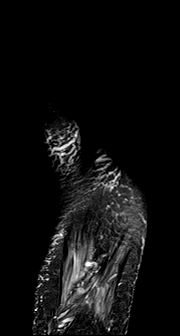
[im 11/32]
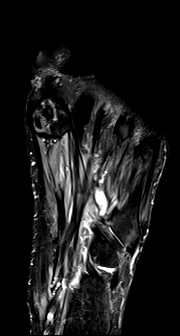
[im 16/32]
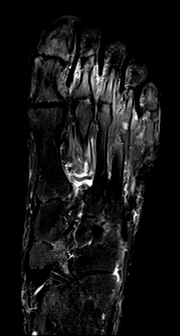
[im 21/32]
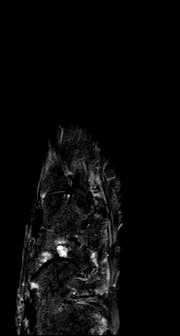
[im 26/32]
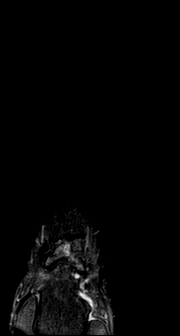
[im 32/32]
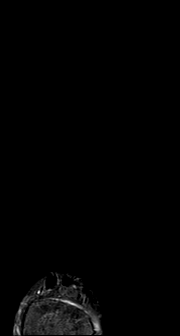

[Series 8: STIR · sagittal · right · 3.0mm · 0.70mm/px · 6 of 31 slices shown]
[im 1/31]
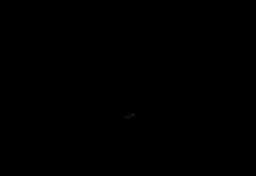
[im 7/31]
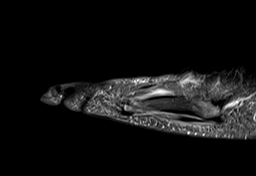
[im 13/31]
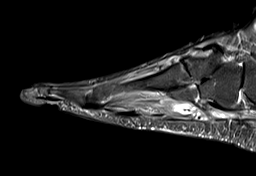
[im 19/31]
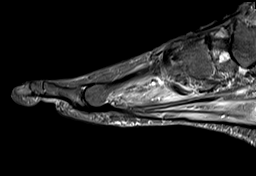
[im 25/31]
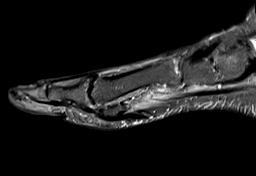
[im 31/31]
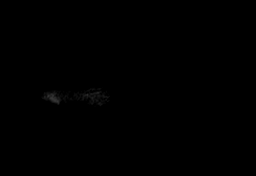

[40 of 40 positions shown; findings below may reference images not displayed]

FINDINGS: Bones/Joint/Cartilage

There is question of a tiny focal area of early erosive type change
seen on the plantar surface of the distal phalanx series 4, image
44. Increased marrow signal seen throughout the distal phalanx of
the first digit and the proximal first phalanx at the
interphalangeal joint. No definite area of cortical destruction is
seen. There is interphalangeal joint and MTP joint osteoarthritis at
the first digit with joint space loss and subchondral cystic
changes. There is osseous coalition seen at the second metatarsal
cuneiform joint and as well with the medial and intermediate
cuneiform. No large joint effusion is noted. There is cystic changes
seen at the talonavicular joint with joint space loss as well as at
the naviculocuneiform joint.

Ligaments

The Lisfranc ligaments and collateral ligaments appear to be intact.

Muscles and Tendons

There is diffusely increased signal seen throughout the muscles,
likely due to microvascular disease and denervation atrophy. The
flexor and extensor tendons are intact. The plantar fascia is
intact.

Soft tissues

There is focal area of skin irregularity/superficial ulceration seen
on the plantar surface of the distal first digit with skin
thickening. There is diffuse subcutaneous edema. No loculated fluid
collection or sinus tract however is seen. There is mild dorsal soft
tissue swelling.
IMPRESSION: Subtle skin ulceration with probable findings of cellulitis
involving the distal digit of the first toe. No sinus tract or
abscess is seen. There is question of early osteomyelitis involving
the plantar surface of the distal first phalanx with a focal area of
erosive type change.

Probable reactive marrow seen throughout the distal and proximal
first phalanx.

Osseous coalition of the second metatarsal cuneiform joint and of
the medial and intermediate cuneiform joints.

Osteoarthritis as described above.

## 2020-02-01 IMAGING — MR MR HEAD W/O CM
12 of 13 series · 43 of 48 positions shown · non-contrast
Comparison: None.

CLINICAL DATA: Generalized weakness, gait instability, and multiple
falls.

EXAM:
MRI HEAD WITHOUT CONTRAST
MRI CERVICAL SPINE WITHOUT CONTRAST
TECHNIQUE: Multiplanar, multiecho pulse sequences of the brain and surrounding
structures, and cervical spine, to include the craniocervical
junction and cervicothoracic junction, were obtained without
intravenous contrast.

[Series 5: DWI · axial · 3.0mm · 0.88mm/px · z∈[-61,+72]mm · 6 of 92 slices shown (1 of 4)]
[im 1/92]
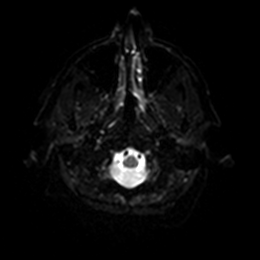
[im 19/92]
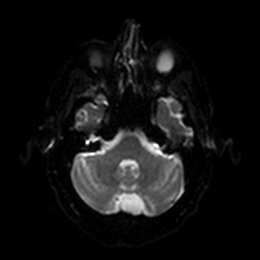
[im 37/92]
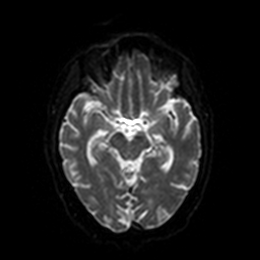
[im 55/92]
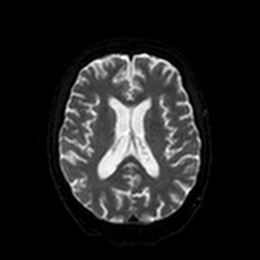
[im 73/92]
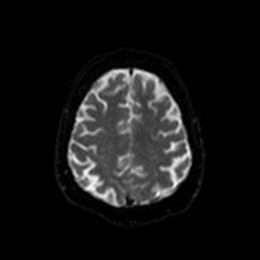
[im 92/92]
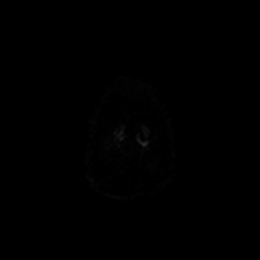

[Series 6: DWI · axial · 3.0mm · 0.88mm/px · z∈[-61,+72]mm · 4 of 46 slices shown (2 of 4)]
[im 1/46]
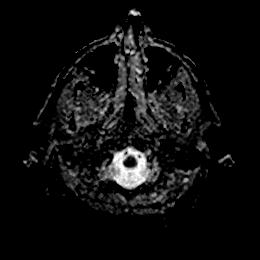
[im 16/46]
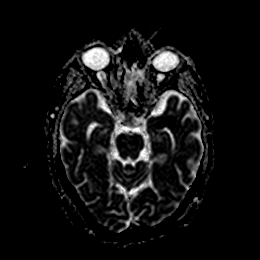
[im 31/46]
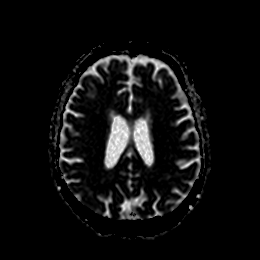
[im 46/46]
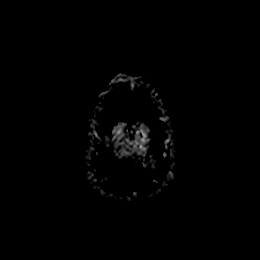

[Series 7: DWI · coronal · 4.0mm · 0.88mm/px · 5 of 64 slices shown (3 of 4)]
[im 1/64]
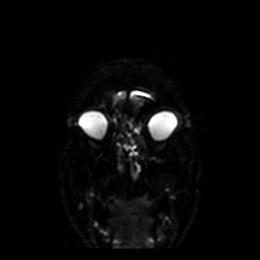
[im 16/64]
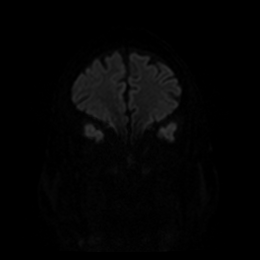
[im 32/64]
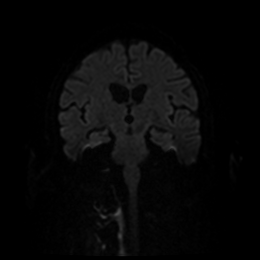
[im 48/64]
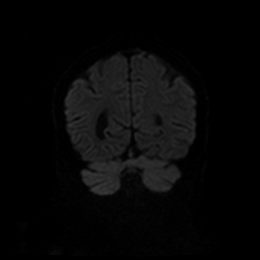
[im 64/64]
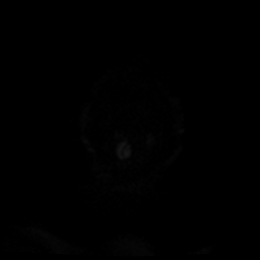

[Series 8: DWI · coronal · 4.0mm · 0.88mm/px · 2 of 32 slices shown (4 of 4)]
[im 1/32]
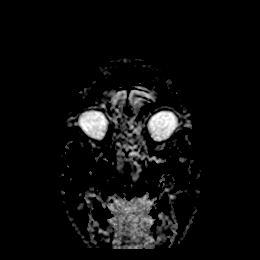
[im 32/32]
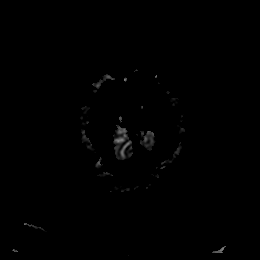

[Series 9: T1 · sagittal · 5.0mm · 0.75mm/px · 2 of 25 slices shown]
[im 1/25]
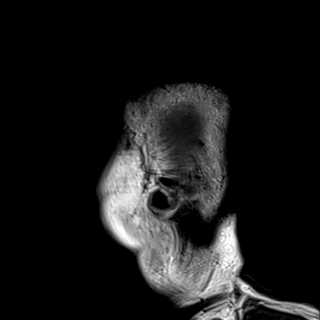
[im 25/25]
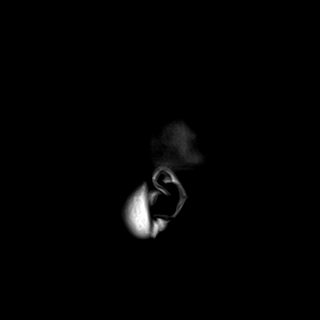

[Series 10: T2 · axial · 5.0mm · 0.72mm/px · z∈[-63,+78]mm · 2 of 25 slices shown (1 of 2)]
[im 1/25]
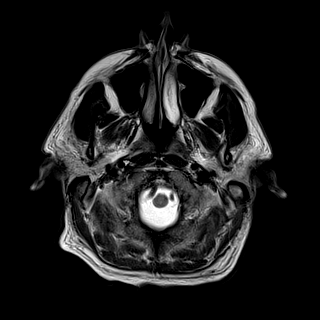
[im 25/25]
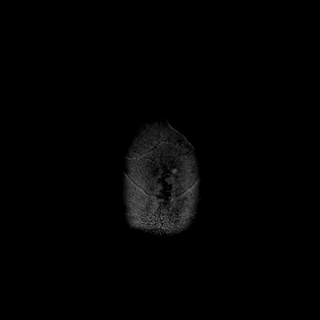

[Series 11: FLAIR · axial · 5.0mm · 0.45mm/px · z∈[-62,+79]mm · 2 of 25 slices shown]
[im 1/25]
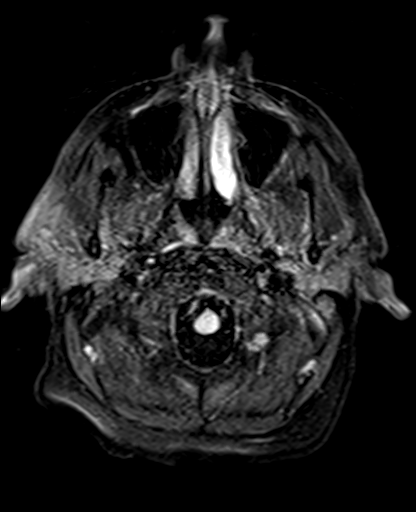
[im 25/25]
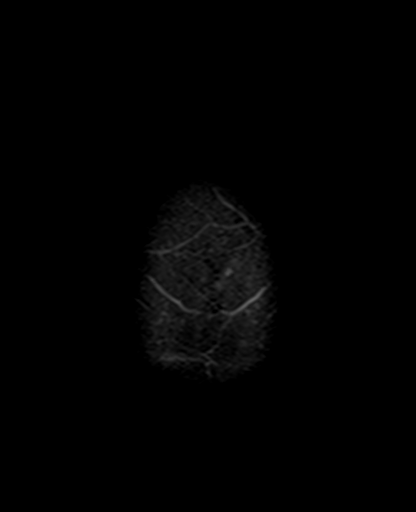

[Series 12: mag_images · axial · 3.0mm · 0.90mm/px · z∈[-81,+93]mm · 5 of 60 slices shown]
[im 1/60]
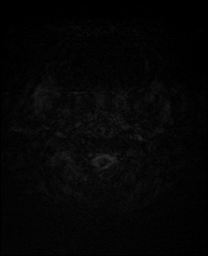
[im 15/60]
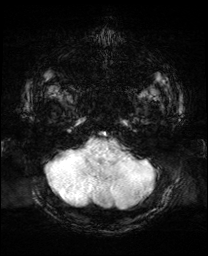
[im 30/60]
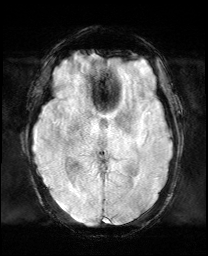
[im 45/60]
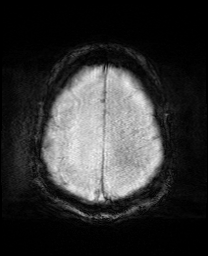
[im 60/60]
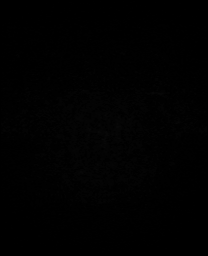

[Series 13: pha_images · axial · 3.0mm · 0.90mm/px · z∈[-78,+84]mm · 4 of 55 slices shown]
[im 1/55]
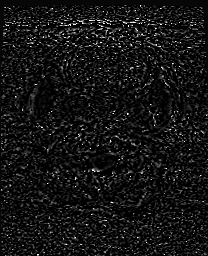
[im 19/55]
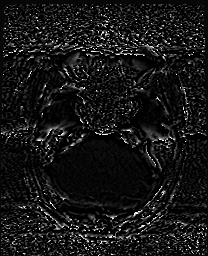
[im 37/55]
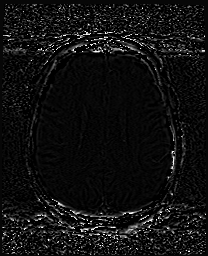
[im 55/55]
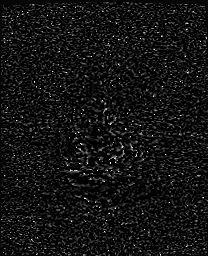

[Series 14: swi_images · axial · 3.0mm · 0.90mm/px · z∈[-81,+93]mm · 5 of 60 slices shown]
[im 1/60]
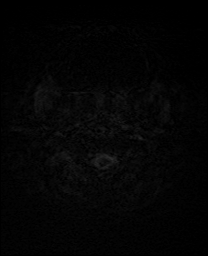
[im 15/60]
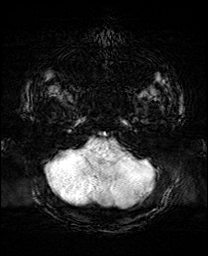
[im 30/60]
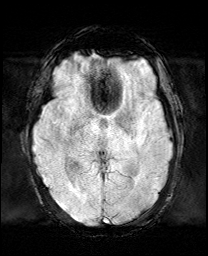
[im 45/60]
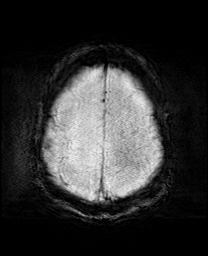
[im 60/60]
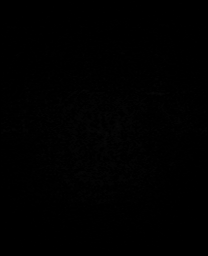

[Series 15: mip_images(sw) · axial · 24.0mm · 0.90mm/px · z∈[-71,+83]mm · 4 of 53 slices shown]
[im 1/53]
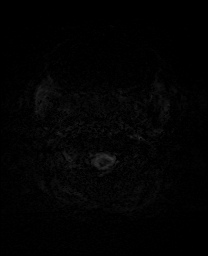
[im 18/53]
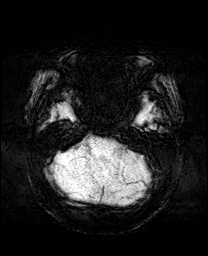
[im 35/53]
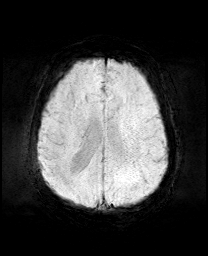
[im 53/53]
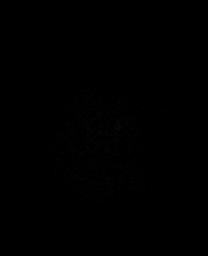

[Series 17: T2 · coronal · 5.0mm · 0.72mm/px · 2 of 28 slices shown (2 of 2)]
[im 1/28]
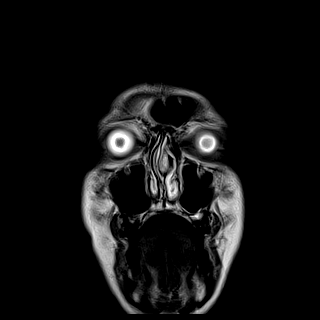
[im 28/28]
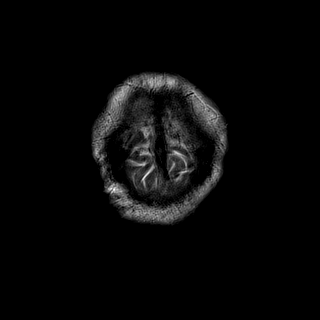

[43 of 48 positions shown; findings below may reference images not displayed]

FINDINGS: MRI HEAD FINDINGS

The study is mildly motion degraded.

Brain: There is no evidence of acute infarct, intracranial
hemorrhage, mass, midline shift, or extra-axial fluid collection.
There is mild cerebral atrophy. Periventricular white matter T2
hyperintensities are nonspecific but compatible with minimal chronic
small vessel ischemic disease.

Vascular: Major intracranial vascular flow voids are preserved.

Skull and upper cervical spine: No suspicious marrow lesion.

Sinuses/Orbits: Unremarkable orbits. Minimal left ethmoid air cell
mucosal thickening. At most trace right mastoid fluid.

Other: None.

MRI CERVICAL SPINE FINDINGS

The study is motion degraded throughout including severe motion on
the axial sequences.

Alignment: No significant listhesis.

Vertebrae: No fracture or suspicious marrow lesion. Chronic
degenerative endplate changes at C6-7 associated with severe disc
space narrowing. Moderate disc space narrowing at C4-5. No discal
fluid signal or frank vertebral marrow edema to indicate discitis or
osteomyelitis. Small right facet joint effusion at C5-6 without
substantial facet marrow edema.

Cord: No cord signal abnormality identified within limitations of
motion artifact.

Posterior Fossa, vertebral arteries, paraspinal tissues: There is
prevertebral edema, and there are apparent fluid collections along
the right anterolateral aspects of the C3-C5 vertebral bodies
measuring up to approximately 4 cm in craniocaudal length and with a
maximal transverse diameter of approximately 1.3 cm. There is a
diffusely edematous appearance of the right scalene musculature more
inferiorly in the neck. The vertebral artery flow voids are grossly
preserved bilaterally.

Disc levels: A small ventral epidural fluid collection is suspected
asymmetrically to the right of midline extending from the C3
inferior to C5 superior endplate levels (for example series 5, image
7 and series 10, image 22). This measures 2-2.5 cm in craniocaudal
length and less than 1 cm in transverse dimension and contributes to
mild spinal stenosis at these levels without frank cord compression.
Detailed assessment of degenerative changes is limited by motion,
however there is no significant spinal stenosis elsewhere. Disc
bulging and uncovertebral spurring result in neural foraminal
stenosis which is likely moderate bilaterally at C3-4, severe on the
right and moderate to severe on the left at C4-5, mild on the right
at C5-6, and moderate on the right at C6-7.
IMPRESSION: MRI HEAD:

1. No acute intracranial abnormality.
2. Mild cerebral atrophy and chronic small vessel ischemia.

MRI CERVICAL SPINE:

1. Severely motion degraded examination.
2. Prevertebral edema with right-sided paravertebral fluid
collections in the mid to lower cervical spine concerning for
infection and abscesses with suspected myositis involving the
right-sided scalene musculature.
3. Suspected small ventral epidural abscess or phlegmon from C3-C5
with mild spinal stenosis. No cord compression.
4. No definite evidence of discitis or osteomyelitis.
5. Small volume facet joint fluid on the right at C5-6 is favored to
be degenerative although septic arthritis is not excluded.

These results will be called to the ordering clinician or
representative by the Radiologist Assistant, and communication
documented in the PACS or zVision Dashboard.

## 2020-02-01 IMAGING — MR MR FOOT*R* W/O CM
5 series · 40 of 40 positions shown · non-contrast
Comparison: Radiograph [DATE]

CLINICAL DATA: Osteomyelitis

EXAM:
MRI OF THE RIGHT FOREFOOT WITHOUT CONTRAST
TECHNIQUE: Multiplanar, multisequence MR imaging of the right was performed. No
intravenous contrast was administered.

[Series 4: T1 · coronal · right · 3.0mm · 0.47mm/px · 8 of 48 slices shown (1 of 2)]
[im 1/48]
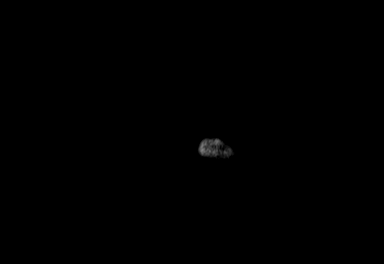
[im 7/48]
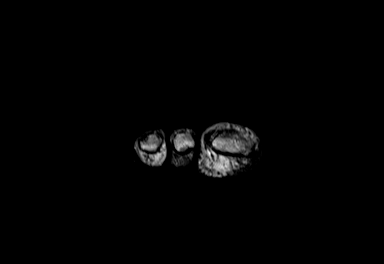
[im 14/48]
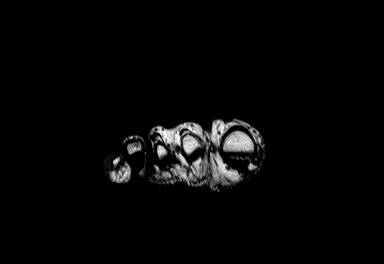
[im 21/48]
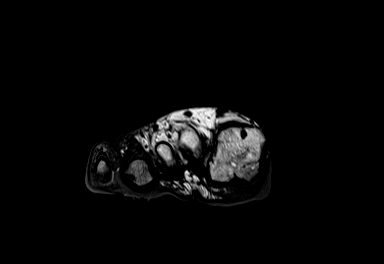
[im 27/48]
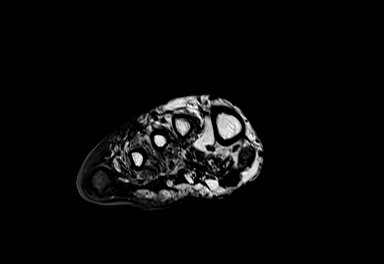
[im 34/48]
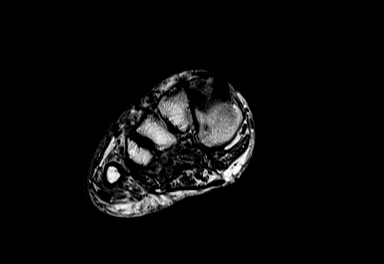
[im 41/48]
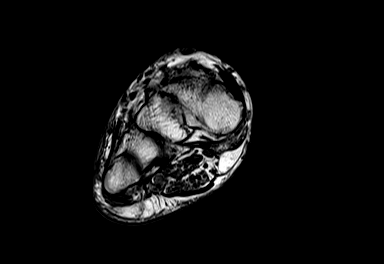
[im 48/48]
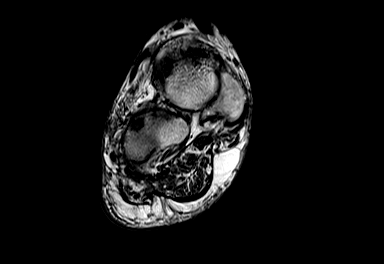

[Series 5: T2 fat-sat · coronal · right · 3.0mm · 0.49mm/px · 8 of 48 slices shown (1 of 2)]
[im 1/48]
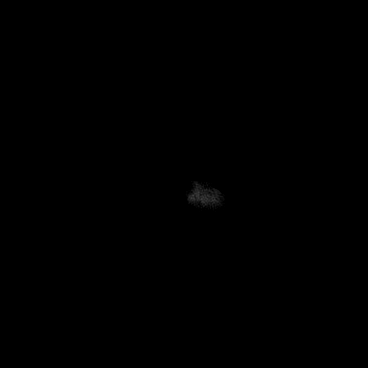
[im 7/48]
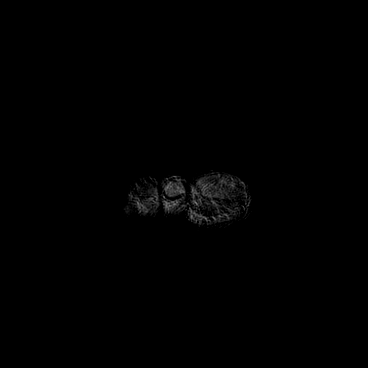
[im 14/48]
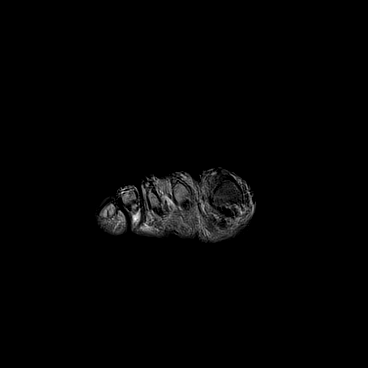
[im 21/48]
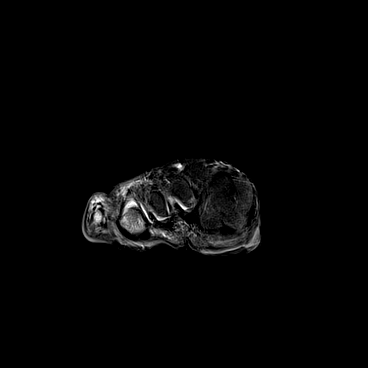
[im 27/48]
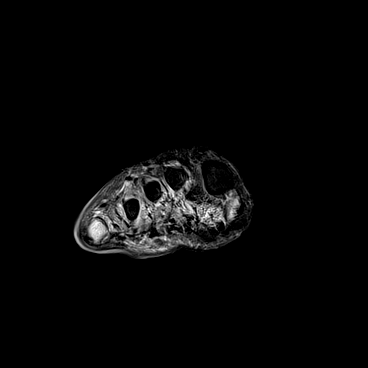
[im 34/48]
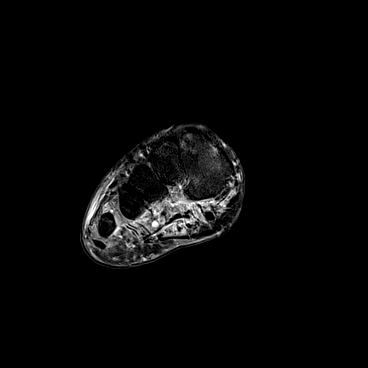
[im 41/48]
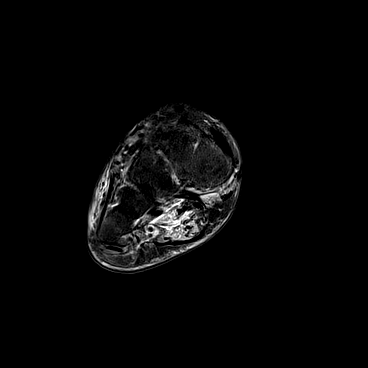
[im 48/48]
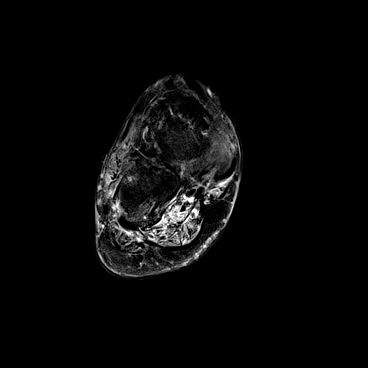

[Series 6: T1 · axial · right · 2.0mm · 0.57mm/px · z∈[-75,+11]mm · 9 of 48 slices shown (2 of 2)]
[im 1/48]
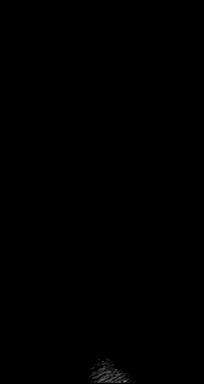
[im 6/48]
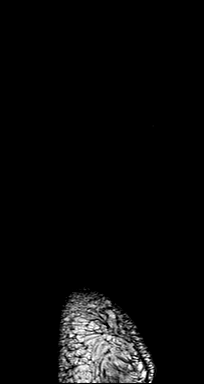
[im 12/48]
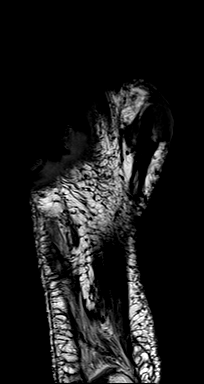
[im 18/48]
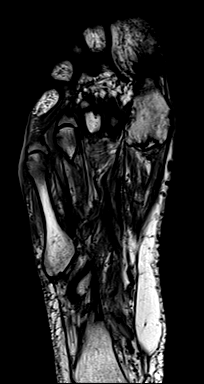
[im 24/48]
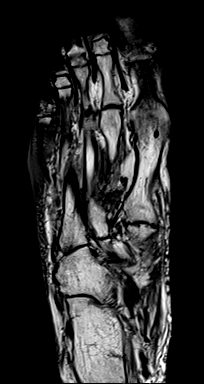
[im 30/48]
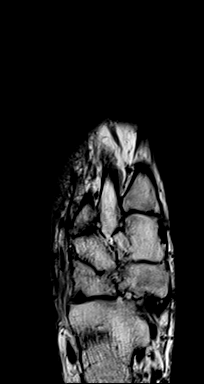
[im 36/48]
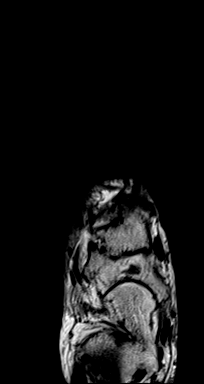
[im 42/48]
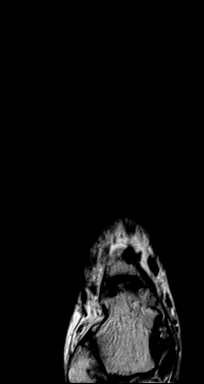
[im 48/48]
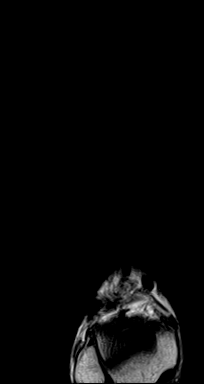

[Series 7: T2 fat-sat · axial · right · 2.0mm · 0.57mm/px · z∈[-75,+11]mm · 9 of 48 slices shown (2 of 2)]
[im 1/48]
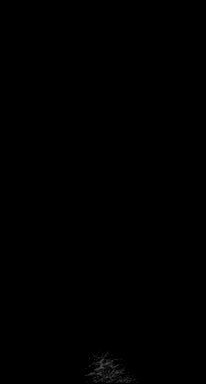
[im 6/48]
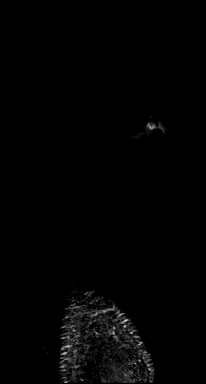
[im 12/48]
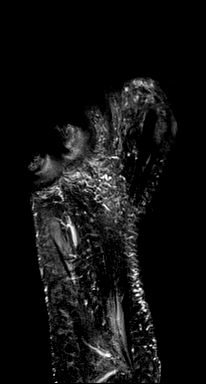
[im 18/48]
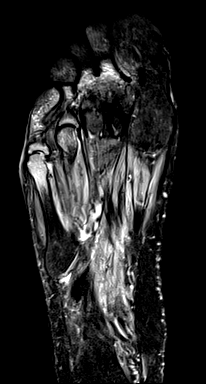
[im 24/48]
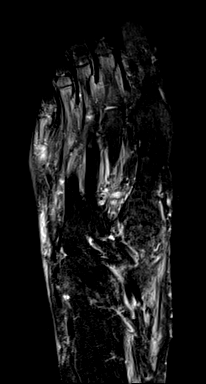
[im 30/48]
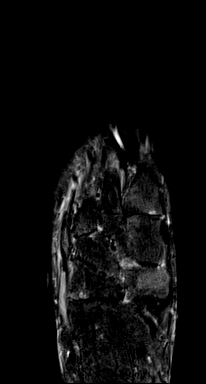
[im 36/48]
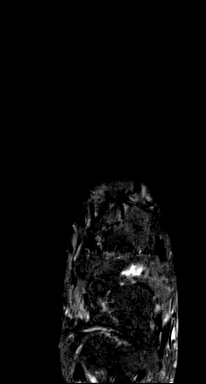
[im 42/48]
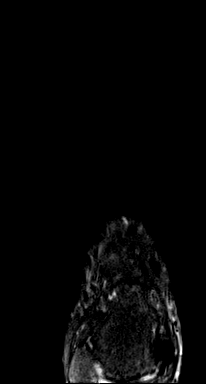
[im 48/48]
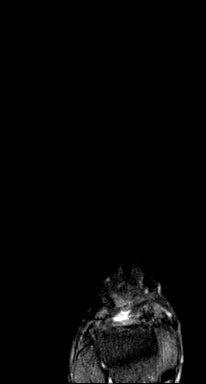

[Series 8: STIR · sagittal · right · 3.0mm · 0.70mm/px · 6 of 31 slices shown]
[im 1/31]
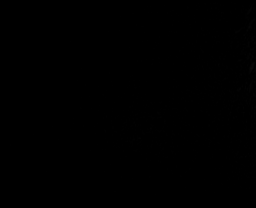
[im 7/31]
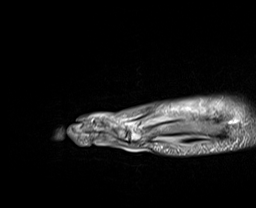
[im 13/31]
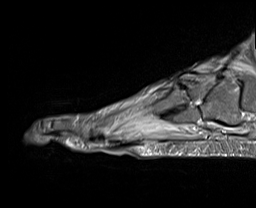
[im 19/31]
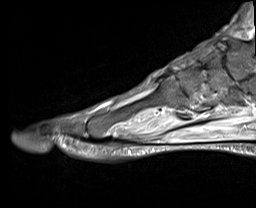
[im 25/31]
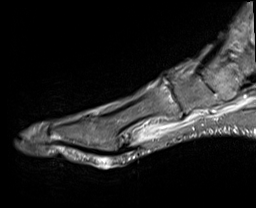
[im 31/31]
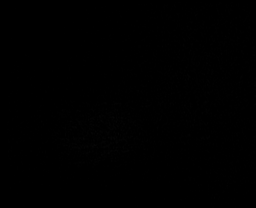

[40 of 40 positions shown; findings below may reference images not displayed]

FINDINGS: Bones/Joint/Cartilage

There is increased marrow signal seen through the fifth metatarsal
head, proximal fifth phalanx, fourth metatarsal head, and proximal
fourth phalanx with associated T1 hypointensity. This could be due
to early osteomyelitis. Increased marrow signal seen within the
distal fourth and fifth phalanges, mid fifth metatarsal shaft,
second and third proximal phalanges, likely due to reactive marrow.
There is large cystic type lucency seen within the navicular as on
prior exam. There appears to be osseous coalition between the
lateral cuneiform and navicular. There is also osseous coalition
between the second metatarsal and intermediate cuneiform as well as
the medial and lateral cuneiform is. Osseous coalition seen at the
first MTP joint. Osteoarthritis seen at the calcaneocuboid joint and
at the MTP cuneiform joint with joint space loss and subchondral
cystic changes.

Ligaments

The Lisfranc ligaments and collateral ligaments are intact.

Muscles and Tendons

Diffusely increased signal seen throughout the muscles, likely due
to microvascular disease and denervation atrophy.

The flexor and extensor tendons appear to be intact. The plantar
fascia is intact.

Soft tissues

There is focal area of superficial ulceration seen on the plantar
surface of the midfoot between the fourth and fifth metatarsal heads
measuring 1.7 cm in transverse dimension, series 5, image 28. No
definite loculated fluid collection or sinus tract is seen. There is
mild dorsal soft tissue swelling.
IMPRESSION: 1. Focal area of superficial ulceration on the plantar surface
beneath the fourth and fifth metatarsal heads. For no sinus tract or
abscess. Findings which could be suggestive of osteomyelitis
involving the fourth and fifth metatarsal heads and proximal
phalanges.
2. Probable reactive marrow within the distal fourth and fifth
phalanges, mid fifth metatarsal shaft, and second and third proximal
phalanges.
3. Osseous coalition as described above.

## 2020-02-01 IMAGING — CR DG SHOULDER 2+V*R*
3 series · 3 of 3 positions shown · non-contrast
Comparison: Chest radiograph dated [DATE].

CLINICAL DATA: 58-year-old male with fall and right shoulder pain.

EXAM:
RIGHT SHOULDER - 2+ VIEW

[shoulder grashey]
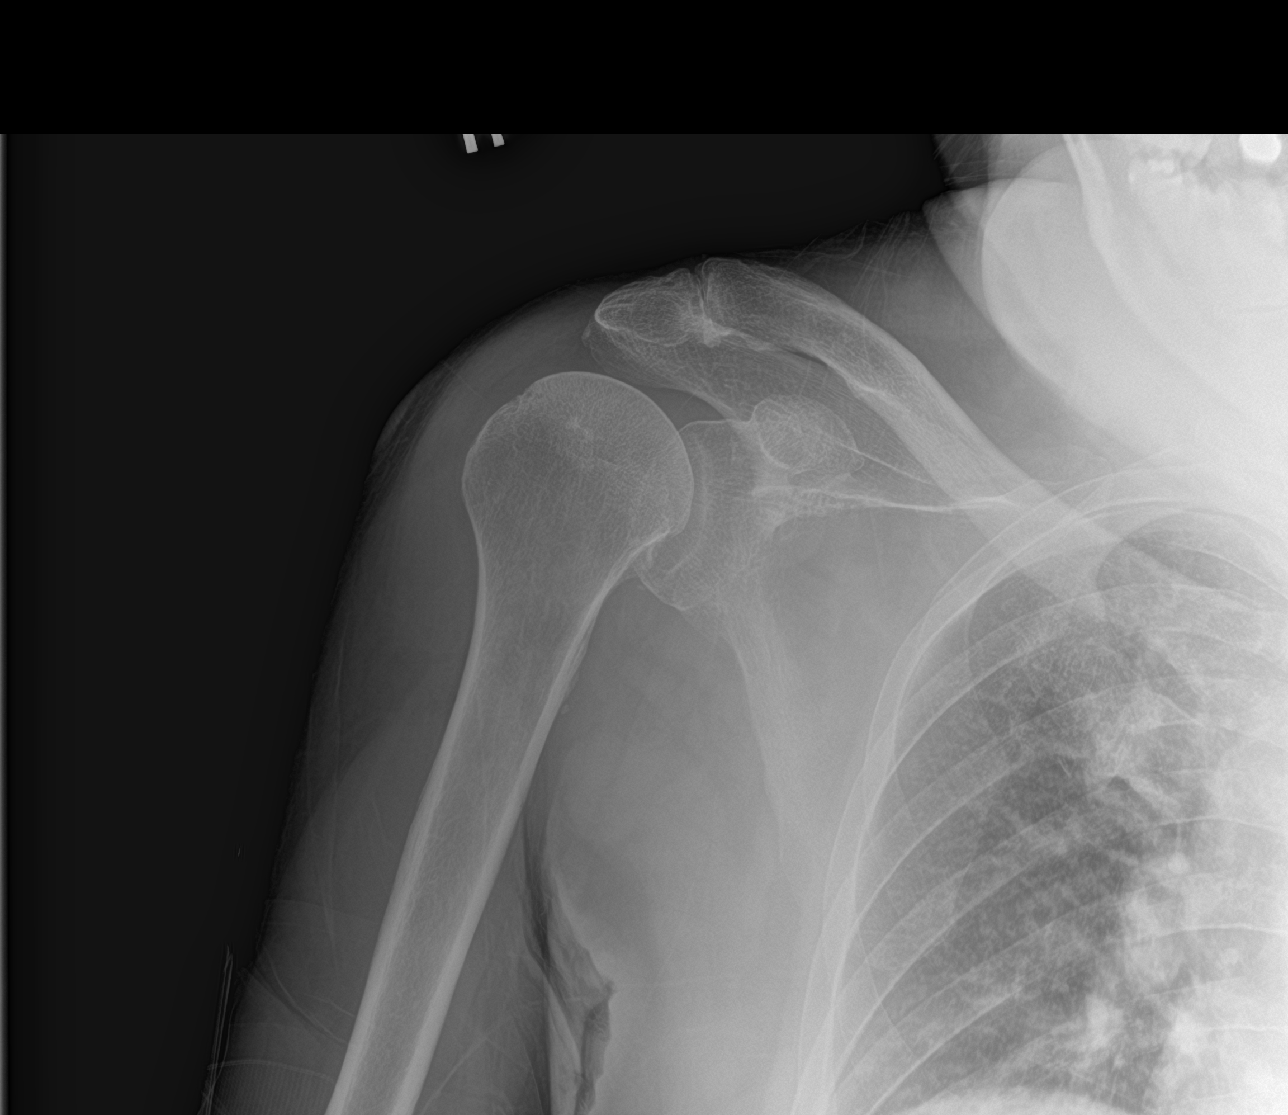

[shoulder y view]
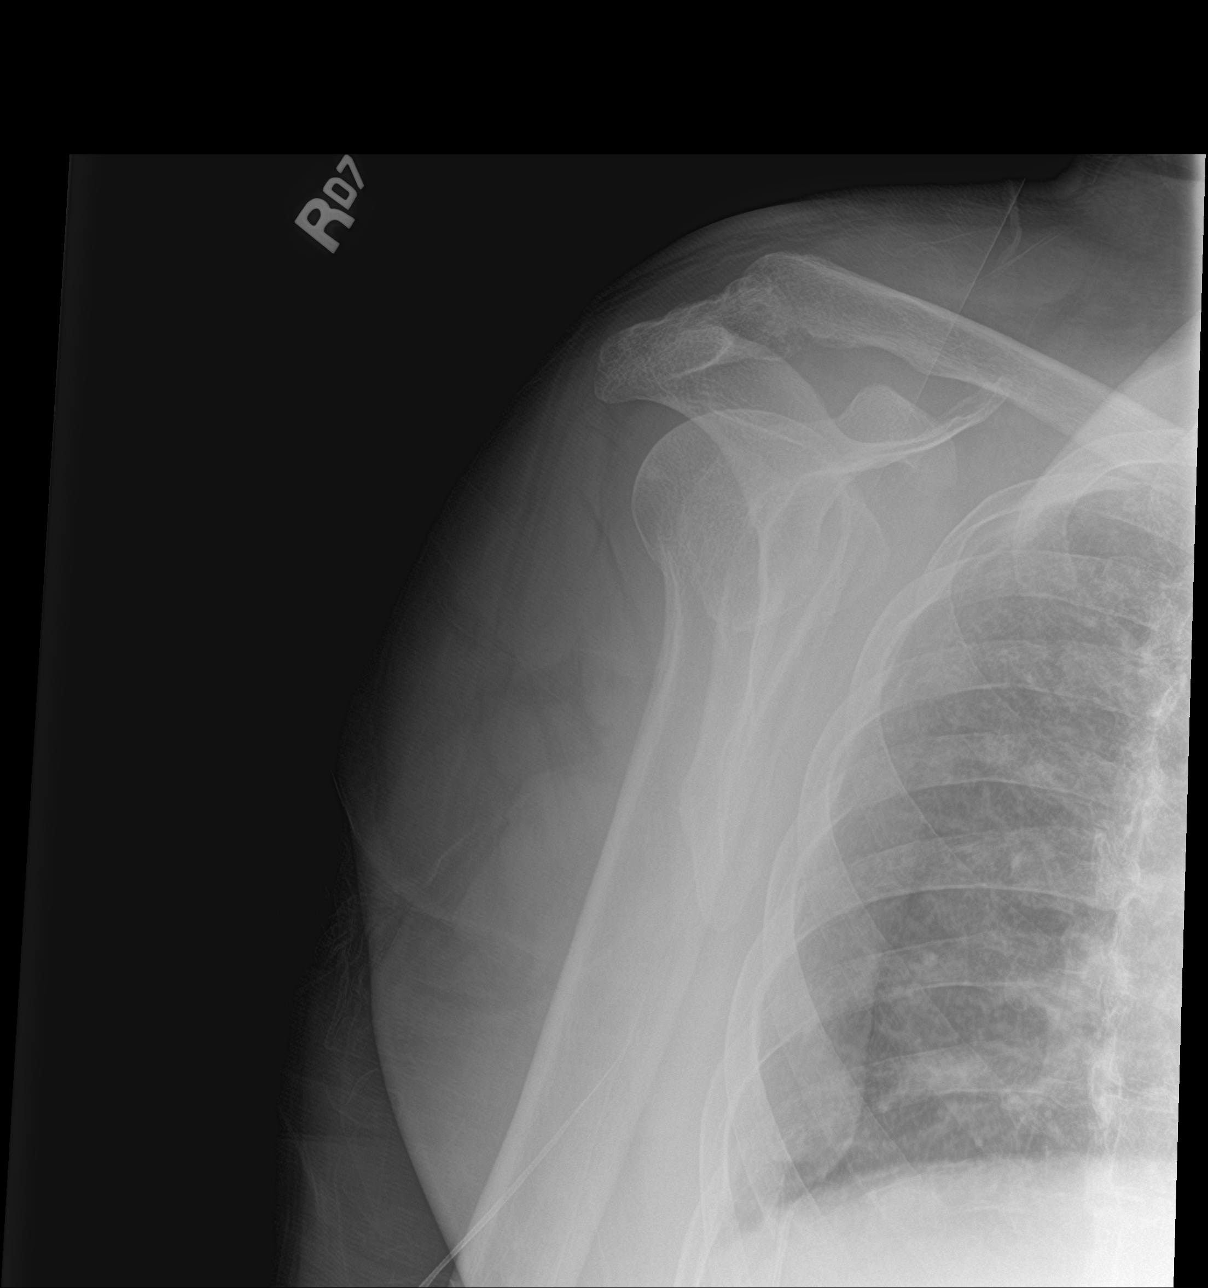

[shoulder ap neutral]
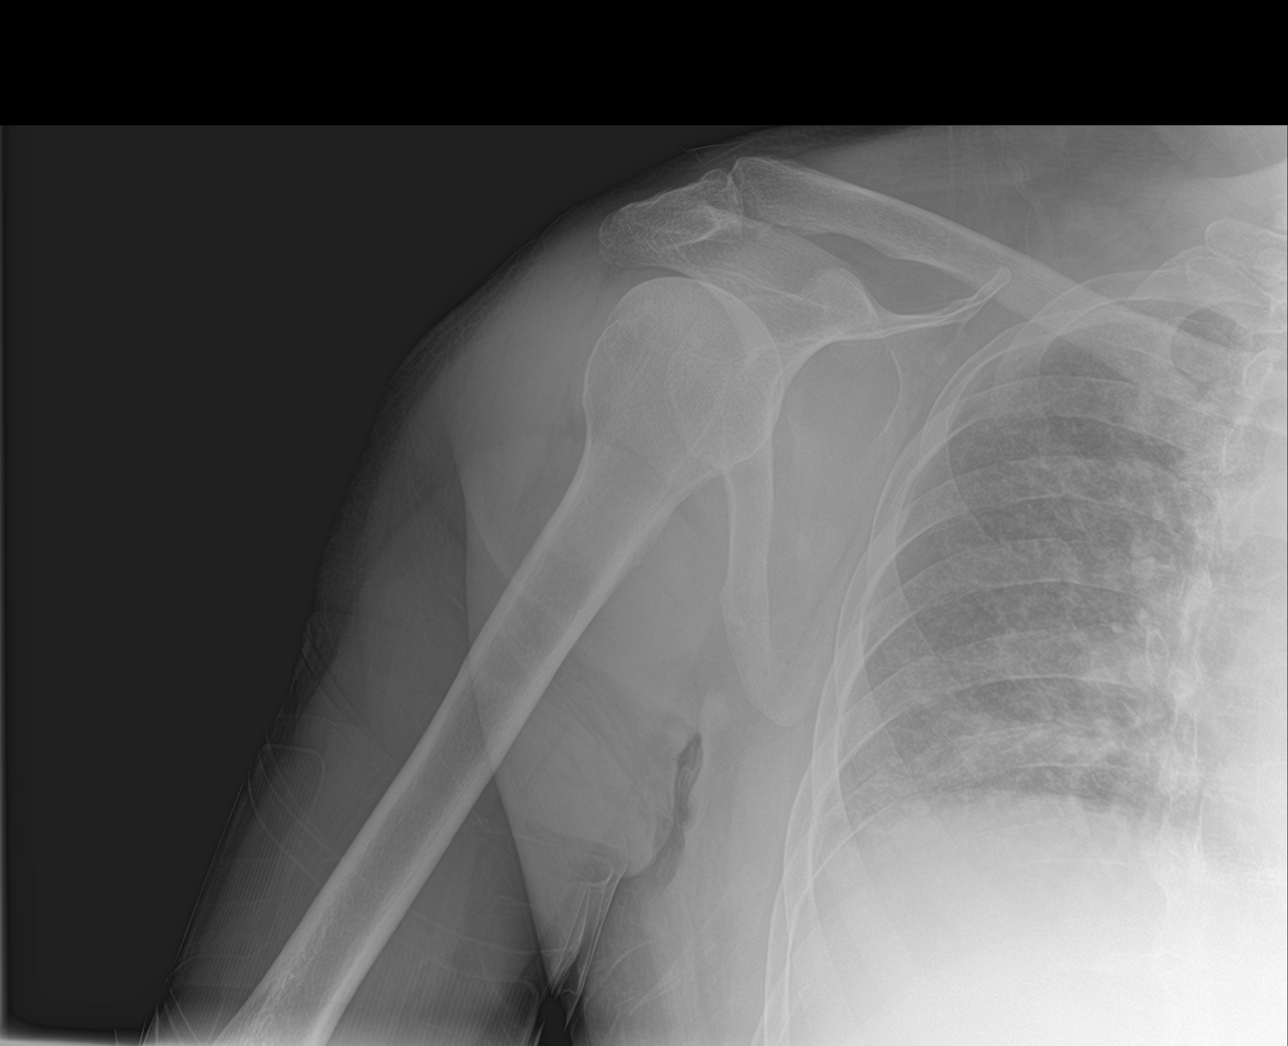

[3 of 3 positions shown; findings below may reference images not displayed]

FINDINGS: There is no acute fracture or dislocation. No significant arthritic
changes. Mild elevation of the right humeral head may represent
chronic rotator cuff injury. There is degenerative changes of the
right AC joint. The soft tissues are unremarkable.
IMPRESSION: No acute fracture or dislocation.

## 2020-02-01 MED ORDER — LACTATED RINGERS IV SOLN: INTRAVENOUS | Status: DC

## 2020-02-01 MED ORDER — ACETAMINOPHEN 500 MG PO TABS
500.0000 mg | ORAL_TABLET | Freq: Once | ORAL | Status: AC
  Administered 2020-02-01: 500 mg via ORAL
  Filled 2020-02-01: qty 1

## 2020-02-01 MED ORDER — POLYETHYLENE GLYCOL 3350 17 G PO PACK
17.0000 g | PACK | Freq: Every day | ORAL | Status: DC | PRN
  Administered 2020-02-04 – 2020-02-07 (×2): 17 g via ORAL
  Filled 2020-02-01 (×2): qty 1

## 2020-02-01 MED ORDER — LACTATED RINGERS IV BOLUS (SEPSIS): 1000.0000 mL | Freq: Once | INTRAVENOUS | Status: DC

## 2020-02-01 MED ORDER — ACETAMINOPHEN 650 MG RE SUPP: 650.0000 mg | Freq: Four times a day (QID) | RECTAL | Status: DC | PRN

## 2020-02-01 MED ORDER — DOCUSATE SODIUM 100 MG PO CAPS
100.0000 mg | ORAL_CAPSULE | Freq: Two times a day (BID) | ORAL | Status: DC
  Administered 2020-02-02: 100 mg via ORAL
  Filled 2020-02-01 (×3): qty 1

## 2020-02-01 MED ORDER — CHLORHEXIDINE GLUCONATE CLOTH 2 % EX PADS
6.0000 | MEDICATED_PAD | Freq: Every day | CUTANEOUS | Status: DC
  Administered 2020-02-02 – 2020-02-29 (×24): 6 via TOPICAL

## 2020-02-01 MED ORDER — LACTATED RINGERS IV BOLUS
1000.0000 mL | Freq: Once | INTRAVENOUS | Status: AC
  Administered 2020-02-01: 1000 mL via INTRAVENOUS

## 2020-02-01 MED ORDER — ONDANSETRON HCL 4 MG/2ML IJ SOLN
4.0000 mg | Freq: Four times a day (QID) | INTRAMUSCULAR | Status: DC | PRN
  Administered 2020-02-03: 10:00:00 4 mg via INTRAVENOUS

## 2020-02-01 MED ORDER — ACETAMINOPHEN 325 MG PO TABS
650.0000 mg | ORAL_TABLET | Freq: Four times a day (QID) | ORAL | Status: DC | PRN
  Administered 2020-02-01: 325 mg via ORAL
  Administered 2020-02-01 – 2020-02-22 (×9): 650 mg via ORAL
  Filled 2020-02-01 (×12): qty 2

## 2020-02-01 MED ORDER — ONDANSETRON HCL 4 MG PO TABS
4.0000 mg | ORAL_TABLET | Freq: Four times a day (QID) | ORAL | Status: DC | PRN
  Administered 2020-02-04: 4 mg via ORAL
  Filled 2020-02-01: qty 1

## 2020-02-01 MED ORDER — ENOXAPARIN SODIUM 40 MG/0.4ML ~~LOC~~ SOLN
40.0000 mg | SUBCUTANEOUS | Status: DC
  Administered 2020-02-01 – 2020-02-02 (×2): 40 mg via SUBCUTANEOUS
  Filled 2020-02-01: qty 0.4

## 2020-02-01 MED ORDER — VANCOMYCIN HCL 1750 MG/350ML IV SOLN
1750.0000 mg | Freq: Once | INTRAVENOUS | Status: AC
  Administered 2020-02-01: 1750 mg via INTRAVENOUS
  Filled 2020-02-01: qty 350

## 2020-02-01 MED ORDER — INSULIN ASPART 100 UNIT/ML ~~LOC~~ SOLN
0.0000 [IU] | Freq: Every day | SUBCUTANEOUS | Status: DC
  Administered 2020-02-01: 2 [IU] via SUBCUTANEOUS
  Administered 2020-02-02: 4 [IU] via SUBCUTANEOUS
  Administered 2020-02-03 – 2020-02-14 (×4): 2 [IU] via SUBCUTANEOUS

## 2020-02-01 MED ORDER — METRONIDAZOLE IN NACL 5-0.79 MG/ML-% IV SOLN
500.0000 mg | Freq: Three times a day (TID) | INTRAVENOUS | Status: DC
  Administered 2020-02-01 (×2): 500 mg via INTRAVENOUS
  Filled 2020-02-01 (×2): qty 100

## 2020-02-01 MED ORDER — SODIUM CHLORIDE 0.9% FLUSH
3.0000 mL | Freq: Two times a day (BID) | INTRAVENOUS | Status: DC
  Administered 2020-02-02 – 2020-02-12 (×7): 3 mL via INTRAVENOUS

## 2020-02-01 MED ORDER — SODIUM CHLORIDE 0.9 % IV SOLN
2.0000 g | Freq: Once | INTRAVENOUS | Status: AC
  Administered 2020-02-01: 2 g via INTRAVENOUS
  Filled 2020-02-01: qty 2

## 2020-02-01 MED ORDER — INSULIN ASPART 100 UNIT/ML ~~LOC~~ SOLN
0.0000 [IU] | Freq: Three times a day (TID) | SUBCUTANEOUS | Status: DC
  Administered 2020-02-01 – 2020-02-02 (×2): 8 [IU] via SUBCUTANEOUS
  Administered 2020-02-02: 2 [IU] via SUBCUTANEOUS
  Administered 2020-02-02: 8 [IU] via SUBCUTANEOUS
  Administered 2020-02-03: 3 [IU] via SUBCUTANEOUS
  Administered 2020-02-03: 5 [IU] via SUBCUTANEOUS
  Administered 2020-02-03: 15 [IU] via SUBCUTANEOUS
  Administered 2020-02-04: 3 [IU] via SUBCUTANEOUS
  Administered 2020-02-04 (×2): 5 [IU] via SUBCUTANEOUS
  Administered 2020-02-05: 3 [IU] via SUBCUTANEOUS
  Administered 2020-02-05: 5 [IU] via SUBCUTANEOUS
  Administered 2020-02-05: 2 [IU] via SUBCUTANEOUS
  Administered 2020-02-06 – 2020-02-07 (×4): 3 [IU] via SUBCUTANEOUS
  Administered 2020-02-07 (×2): 5 [IU] via SUBCUTANEOUS
  Administered 2020-02-08: 3 [IU] via SUBCUTANEOUS
  Administered 2020-02-08: 8 [IU] via SUBCUTANEOUS
  Administered 2020-02-08 – 2020-02-09 (×2): 3 [IU] via SUBCUTANEOUS
  Administered 2020-02-09: 5 [IU] via SUBCUTANEOUS
  Administered 2020-02-09: 3 [IU] via SUBCUTANEOUS
  Administered 2020-02-10: 8 [IU] via SUBCUTANEOUS
  Administered 2020-02-10: 3 [IU] via SUBCUTANEOUS
  Administered 2020-02-10: 5 [IU] via SUBCUTANEOUS
  Administered 2020-02-11 (×2): 3 [IU] via SUBCUTANEOUS
  Administered 2020-02-12: 5 [IU] via SUBCUTANEOUS
  Administered 2020-02-12: 3 [IU] via SUBCUTANEOUS
  Administered 2020-02-13: 2 [IU] via SUBCUTANEOUS
  Administered 2020-02-13: 5 [IU] via SUBCUTANEOUS
  Administered 2020-02-13: 3 [IU] via SUBCUTANEOUS
  Administered 2020-02-14: 5 [IU] via SUBCUTANEOUS
  Administered 2020-02-14 (×2): 3 [IU] via SUBCUTANEOUS
  Administered 2020-02-15: 2 [IU] via SUBCUTANEOUS
  Administered 2020-02-15: 3 [IU] via SUBCUTANEOUS
  Administered 2020-02-15 – 2020-02-16 (×2): 5 [IU] via SUBCUTANEOUS
  Administered 2020-02-16 (×2): 2 [IU] via SUBCUTANEOUS
  Administered 2020-02-17: 5 [IU] via SUBCUTANEOUS
  Administered 2020-02-17 – 2020-02-19 (×8): 3 [IU] via SUBCUTANEOUS
  Administered 2020-02-20: 2 [IU] via SUBCUTANEOUS
  Administered 2020-02-20 – 2020-02-21 (×3): 3 [IU] via SUBCUTANEOUS
  Administered 2020-02-21 – 2020-02-22 (×2): 2 [IU] via SUBCUTANEOUS
  Administered 2020-02-22 – 2020-02-23 (×5): 3 [IU] via SUBCUTANEOUS
  Administered 2020-02-24: 2 [IU] via SUBCUTANEOUS
  Administered 2020-02-24: 3 [IU] via SUBCUTANEOUS
  Administered 2020-02-24: 8 [IU] via SUBCUTANEOUS
  Administered 2020-02-25: 5 [IU] via SUBCUTANEOUS
  Administered 2020-02-25 (×2): 3 [IU] via SUBCUTANEOUS
  Administered 2020-02-26: 2 [IU] via SUBCUTANEOUS
  Administered 2020-02-26: 3 [IU] via SUBCUTANEOUS
  Administered 2020-02-26 – 2020-02-27 (×2): 2 [IU] via SUBCUTANEOUS
  Administered 2020-02-27 – 2020-02-28 (×3): 3 [IU] via SUBCUTANEOUS
  Administered 2020-02-28: 2 [IU] via SUBCUTANEOUS
  Administered 2020-02-29 (×3): 3 [IU] via SUBCUTANEOUS
  Administered 2020-03-01: 8 [IU] via SUBCUTANEOUS

## 2020-02-01 MED ORDER — SODIUM CHLORIDE 0.9 % IV SOLN
2.0000 g | Freq: Two times a day (BID) | INTRAVENOUS | Status: DC
  Administered 2020-02-01: 2 g via INTRAVENOUS
  Filled 2020-02-01: qty 2

## 2020-02-01 MED ORDER — VANCOMYCIN HCL 750 MG/150ML IV SOLN: 750.0000 mg | INTRAVENOUS | Status: DC

## 2020-02-01 NOTE — Progress Notes (Signed)
Pharmacy Antibiotic Note  Chris Bowman is a 59 y.o. male admitted on 02/01/2020 with sepsis.  Pharmacy has been consulted for vancomycin/cefepime dosing. Also started on Flagyl. Tmax/24h 101.3, WBC 24.6, LA 1.8. SCr 2.33 on admit (no baseline in Epic).  Cefepime 2g IV x 1 and vancomycin 1750mg  IV x 1 already given in the ED this morning.  Plan: Continue cefepime 2g IV q12h Continue vancomycin 750 mg IV Q 24 hrs. Goal AUC 400-550. Expected AUC: 474 SCr used: 2.33 Flagyl 500mg  IV q8h per MD Monitor clinical progress, c/s, renal function F/u de-escalation plan/LOT, vancomycin levels as indicated   Height: 5\' 8"  (172.7 cm) Weight: 150 lb (68 kg) IBW/kg (Calculated) : 68.4  Temp (24hrs), Avg:99.8 F (37.7 C), Min:98.3 F (36.8 C), Max:101.3 F (38.5 C)  Recent Labs  Lab 02/01/20 0805 02/01/20 0950  WBC 24.6*  --   CREATININE 2.33*  --   LATICACIDVEN  --  1.8    Estimated Creatinine Clearance: 33.6 mL/min (A) (by C-G formula based on SCr of 2.33 mg/dL (H)).    Allergies  Allergen Reactions  . Codeine     Kept sedated     Antimicrobials this admission: 3/4 vancomycin >>  3/4 cefepime >>  3/4 flagyl x 1  Dose adjustments this admission:   Microbiology results:   , PharmD, BCPS Please check AMION for all [facility] contact numbers Clinical Pharmacist 02/01/2020 12:13 PM

## 2020-02-01 NOTE — ED Notes (Signed)
Contacted Dr. Ophelia Charter for pt retaining urine from bladder scan. Awaiting response.

## 2020-02-01 NOTE — ED Notes (Signed)
Pt attempted multiple times to urinate. Unable to produce any urine for sample. Will bladder scan to see if pt is retaining.

## 2020-02-01 NOTE — Progress Notes (Signed)
PHARMACY - PHYSICIAN COMMUNICATION CRITICAL VALUE ALERT - BLOOD CULTURE IDENTIFICATION (BCID)  Chris Bowman is an 59 y.o. male who presented to [facility] on 02/01/2020 with a chief complaint of weakness and falls.  Assessment:  Already started on broad spectrum antibiotics for sepsis.  MD suspects patient's foot is the source.  1 of 3 bottles is growing GPC thus far.  Tmax 102.1, WBC 24.6.  Name of physician (or Provider) Contacted: None  Current antibiotics: vancomycin, cefepime and Flagyl  Changes to prescribed antibiotics recommended:  Patient is on recommended antibiotics - No changes needed F/u repeat BCx  [name], PharmD, BCPS, BCCCP 02/01/2020, 10:58 PM

## 2020-02-01 NOTE — ED Provider Notes (Signed)
[name] EMERGENCY DEPARTMENT Provider Note   CSN: [id number] Arrival date & time: 02/01/20  0751     History Chief Complaint  Patient presents with  . Weakness  . Fall    [address] Chris Bowman is a 59 y.o. male.  HPI Patient presents with generalized weakness.  Has had 3 falls because of it.  States began around 2 weeks ago.  States he has had chills on and off.  No cough.  No nausea or vomiting.  Somewhat decreased appetite.  States has mild right arm pain.  Also abrasion to left hand.  Lives alone.  No fevers.  No known sick contacts.  No confusion.  States he has been told he was diabetic before but does not medicines.  States patient does not see a doctor.  States his neck has felt a little tight at times also.    History reviewed. No pertinent past medical history.  There are no problems to display for this patient.   History reviewed. No pertinent surgical history.     No family history on file.  Social History   Tobacco Use  . Smoking status: Not on file  Substance Use Topics  . Alcohol use: Not on file  . Drug use: Not on file    Home Medications Prior to Admission medications   Not on File    Allergies    Patient has no allergy information on record.  Review of Systems   Review of Systems  Constitutional: Positive for fatigue.  HENT: Negative for congestion.   Respiratory: Negative for shortness of breath.   Cardiovascular: Negative for chest pain.  Gastrointestinal: Negative for abdominal pain.  Genitourinary: Negative for flank pain.  Musculoskeletal: Negative for back pain.  Skin: Positive for wound.  Neurological: Positive for tremors and weakness.    Physical Exam Updated Vital Signs BP 119/66   Pulse 97   Temp (!) 101.3 F (38.5 C) (Rectal)   Resp (!) 24   Ht 5\' 8"  (1.727 m)   Wt 68 kg   SpO2 91%   BMI 22.81 kg/m   Physical Exam Vitals and nursing note reviewed.  HENT:     Head: Normocephalic.  Eyes:      General: No scleral icterus.    Pupils: Pupils are equal, round, and reactive to light.  Cardiovascular:     Rate and Rhythm: Tachycardia present.  Pulmonary:     Breath sounds: No wheezing or rhonchi.  Abdominal:     Tenderness: There is no abdominal tenderness.  Musculoskeletal:     Cervical back: Neck supple.     Comments: Mild abrasion on knuckles of the left hand.  No underlying bony tenderness.  No tenderness on right upper extremity pelvis or back.  Skin:    General: Skin is warm.     Capillary Refill: Capillary refill takes less than 2 seconds.  Neurological:     Mental Status: He is alert.     ED Results / Procedures / Treatments   Labs (all labs ordered are listed, but only abnormal results are displayed) Labs Reviewed  BASIC METABOLIC PANEL - Abnormal; Notable for the following components:      Result Value   Sodium 120 (*)    Chloride 85 (*)    CO2 19 (*)    Glucose, Bld 316 (*)    BUN 42 (*)    Creatinine, Ser 2.33 (*)    Calcium 8.6 (*)    GFR calc non Af  Amer 30 (*)    GFR calc Af Amer 35 (*)    Anion gap 16 (*)    All other components within normal limits  CBC - Abnormal; Notable for the following components:   WBC 24.6 (*)    Hemoglobin 11.7 (*)    HCT 36.5 (*)    MCV 70.5 (*)    MCH 22.6 (*)    Platelets 441 (*)    All other components within normal limits  HEPATIC FUNCTION PANEL - Abnormal; Notable for the following components:   Total Protein 8.2 (*)    Albumin 2.3 (*)    AST 75 (*)    ALT 109 (*)    Alkaline Phosphatase 317 (*)    Bilirubin, Direct 0.3 (*)    All other components within normal limits  HEMOGLOBIN A1C - Abnormal; Notable for the following components:   Hgb A1c MFr Bld 11.3 (*)    All other components within normal limits  DIFFERENTIAL - Abnormal; Notable for the following components:   Neutro Abs 19.3 (*)    Monocytes Absolute 1.6 (*)    Abs Immature Granulocytes 0.26 (*)    All other components within normal limits  CBG  MONITORING, ED - Abnormal; Notable for the following components:   Glucose-Capillary 298 (*)    All other components within normal limits  CULTURE, BLOOD (ROUTINE X 2)  CULTURE, BLOOD (ROUTINE X 2)  URINE CULTURE  SARS CORONAVIRUS 2 (TAT 6-24 HRS)  LACTIC ACID, PLASMA  APTT  PROTIME-INR  URINALYSIS, ROUTINE W REFLEX MICROSCOPIC  TSH  LACTIC ACID, PLASMA  POC SARS CORONAVIRUS 2 AG -  ED    EKG EKG Interpretation  Date/Time:  Thursday February 01 2020 07:54:54 EST Ventricular Rate:  106 PR Interval:  126 QRS Duration: 76 QT Interval:  326 QTC Calculation: 433 R Axis:   50 Text Interpretation: Sinus tachycardia Possible Left atrial enlargement Borderline ECG Confirmed by Benjiman Core [phone]) on 02/01/2020 9:18:01 AM   Radiology DG Chest Portable 1 View  Result Date: 02/01/2020 CLINICAL DATA:  Generalized weakness, shortness of breath EXAM: PORTABLE CHEST 1 VIEW COMPARISON:  None FINDINGS: Heart is normal size. Lingular scarring. Right lung clear. No effusions or acute bony abnormality. IMPRESSION: No active disease. Electronically Signed   By: Charlett Nose M.D.   On: 02/01/2020 09:49    Procedures Procedures (including critical care time)  Medications Ordered in ED Medications  ceFEPIme (MAXIPIME) 2 g in sodium chloride 0.9 % 100 mL IVPB (2 g Intravenous New Bag/Given 02/01/20 1025)  vancomycin (VANCOREADY) IVPB 1750 mg/350 mL (has no administration in time range)  acetaminophen (TYLENOL) tablet 500 mg (500 mg Oral Given 02/01/20 0917)  lactated ringers bolus 1,000 mL (1,000 mLs Intravenous New Bag/Given 02/01/20 0934)    ED Course  I have reviewed the triage vital signs and the nursing notes.  Pertinent labs & imaging results that were available during my care of the patient were reviewed by me and considered in my medical decision making (see chart for details).    MDM Rules/Calculators/A&P                      Patient presents with generalized weakness and recurrent  falls.  States he has had chills.  Has been feeling weak for around 2 weeks.  No cough.  No dysuria.  States he does get up at night to urinate but has not as a noticeable change.  States has been told he  is diabetic in the past but does not see doctors on the medications.  Has been generally weak and states he had difficulty getting up the other day when he fell.  Patient found to be febrile here.  Although does not have clear source of infection.  Not hypotensive but does have worsening kidney function.  Initially fluid bolus written for 1 L of lactated Ringer's then add another liter, however with hyponatremia with sodium of 120 will be a little more judicious as to not correct too quickly.  White count is elevated.  Will end up admitting to unassigned medicine.  Initial lactate normal.  Not hypotensive.  Unsure of the timeframe of the kidney function.  With the hyponatremia fluid given little more carefully.  No clear source of infection but first dose of antibiotics given.  Will admit to unassigned medicine.  Initial Covid test negative but PCR pending.  CRITICAL CARE Performed by: Davonna Belling Total critical care time: 30 minutes Critical care time was exclusive of separately billable procedures and treating other patients. Critical care was necessary to treat or prevent imminent or life-threatening deterioration. Critical care was time spent personally by me on the following activities: development of treatment plan with patient and/or surrogate as well as nursing, discussions with consultants, evaluation of patient's response to treatment, examination of patient, obtaining history from patient or surrogate, ordering and performing treatments and interventions, ordering and review of laboratory studies, ordering and review of radiographic studies, pulse oximetry and re-evaluation of patient's condition.    Final Clinical Impression(s) / ED Diagnoses Final diagnoses:  Generalized weakness   Type 2 diabetes mellitus with hyperglycemia, without long-term current use of insulin (HCC)  Fever, unspecified fever cause  AKI (acute kidney injury) ([place])  Hyponatremia    Rx / DC Orders ED Discharge Orders    None       Davonna Belling, MD 02/01/20 1045

## 2020-02-01 NOTE — ED Notes (Signed)
Pt back from MRI 

## 2020-02-01 NOTE — ED Notes (Signed)
Pt to MRI

## 2020-02-01 NOTE — ED Notes (Signed)
Ordered meal tray for pt 

## 2020-02-01 NOTE — H&P (Signed)
History and Physical    Chris Bowman EPP:[id number] DOB: 08/04/1961 DOA: 02/01/2020  PCP: Patient, No Pcp Per - [facility]; last saw a doctor in maybe 2008 Consultants:  None Patient coming from:  Home - lives alone; NOK: Heide Scales, [phone]  Chief Complaint: weakness, falls  HPI: Chris Bowman is a 59 y.o. male with unknown medical history presenting with weakness and falls.  He reports that he started having weak spells 2 weeks ago.  He doesn't like to go outside in the weather since he doesn't drive and can't afford to pay a taxi.  He has had "this great limitation on my range of movement", chronic buttocks pain since the falls (improving).  He also noticed R arm pain.  He thought this might be related to excessive portion of steak with A1c sauce from Friday to Saturday morning prior to onset of symptoms.  He decided to eliminate sugar from his diet.  He had been diagnosed with DM in 2008 or so but didn't think he needed medicine for it - "I mean they gave me a few insulin shots on a couple of appointments but I didn't think I needed to go whole hog with every day insulin shots."  He has had fever at home maybe 2 weeks ago.  Significant cough with SOB.  No urinary symptoms.  No recent n/v/d.  No abdominal pain.  He was told in the past that he could have a mental health problem, possibly a delusional disorder, doesn't think it is schizophrenia.  He does not think he still struggles with this, "once I got out of control from my mother and sisters and ex-wife, I think I ... kicked that habit."  Reports h/o taking too many baby ASA and having his stomach pumped and "I have a little incision scar from that."    ED Course:  2 weeks of generalized weakness.  Rectal fever, ?source.  Falls, can't get up.  BD COVID negative.  WBC 24, UA pending.  Glucose 300.  A1c is 11.  Does not see a doctor despite h/o DM. Na++ 120.  Sepsis, ?COVID, ?maybe a source  somewhere.  Review of Systems: As per HPI; otherwise review of systems reviewed and negative.   Ambulatory Status:  Ambulates without assistance  Past Medical History:  Diagnosis Date  . Delusional disorder ([place])   . Essential hypertension   . Uncontrolled type 2 diabetes mellitus ([place])     Past Surgical History:  Procedure Laterality Date  . TONSILLECTOMY      Social History   Socioeconomic History  . Marital status: Divorced    Spouse name: Not on file  . Number of children: Not on file  . Years of education: Not on file  . Highest education level: Not on file  Occupational History  . Not on file  Tobacco Use  . Smoking status: Never Smoker  . Smokeless tobacco: Never Used  Substance and Sexual Activity  . Alcohol use: Never  . Drug use: Never  . Sexual activity: Not on file  Other Topics Concern  . Not on file  Social History Narrative  . Not on file   Social Determinants of Health   Financial Resource Strain:   . Difficulty of Paying Living Expenses: Not on file  Food Insecurity:   . Worried About Charity fundraiser in the Last Year: Not on file  . Ran Out of Food in the Last Year: Not  on file  Transportation Needs:   . Lack of Transportation (Medical): Not on file  . Lack of Transportation (Non-Medical): Not on file  Physical Activity:   . Days of Exercise per Week: Not on file  . Minutes of Exercise per Session: Not on file  Stress:   . Feeling of Stress : Not on file  Social Connections:   . Frequency of Communication with Friends and Family: Not on file  . Frequency of Social Gatherings with Friends and Family: Not on file  . Attends Religious Services: Not on file  . Active Member of Clubs or Organizations: Not on file  . Attends Archivist Meetings: Not on file  . Marital Status: Not on file  Intimate Partner Violence:   . Fear of Current or Ex-Partner: Not on file  . Emotionally Abused: Not on file  . Physically Abused: Not on  file  . Sexually Abused: Not on file    Allergies  Allergen Reactions  . Codeine     Kept sedated     Family History  Problem Relation Age of Onset  . Diabetes Mother     Prior to Admission medications   Not on File    Physical Exam: Vitals:   02/01/20 1330 02/01/20 1400 02/01/20 1600 02/01/20 1756  BP: (!) 131/98 132/76 120/73 (!) 157/81  Pulse: 99 98 96 82  Resp: 18 18 16 18   Temp:  (!) 102.1 F (38.9 C) 99.5 F (37.5 C) 99.9 F (37.7 C)  TempSrc:  Oral    SpO2: 96% 97% 98% 99%  Weight:      Height:         . General:  Appears calm and comfortable and is NAD . Eyes:  PERRL, EOMI, normal lids, iris . ENT:  grossly normal hearing, lips & tongue, mmm; poor dentition . Neck:  no LAD, masses or thyromegaly . Cardiovascular:  RRR, no m/r/g. No LE edema.  Marland Kitchen Respiratory:   CTA bilaterally with no wheezes/rales/rhonchi.  Normal respiratory effort. . Abdomen:  soft, NT, ND, NABS . Skin:  Left hand lesion with excoriation from recent fall; B feet with ulcerations - R medial great toe appears somewhat more superficial; B ulcers on great and 5th metatarsal heads present since July and clearly concerning for deeper infection           . Musculoskeletal:  Decreased L leg flexion, 4/5; intermittent hand tremor which patient says is intentional in an effort to ensure blood flow to his hands . Psychiatric:  eccentric mood and affect, speech fluent and appropriate, AOx3 . Neurologic:  CN 2-12 grossly intact, moves all extremities in coordinated fashion    Radiological Exams on Admission: DG Chest Portable 1 View  Result Date: 02/01/2020 CLINICAL DATA:  Generalized weakness, shortness of breath EXAM: PORTABLE CHEST 1 VIEW COMPARISON:  None FINDINGS: Heart is normal size. Lingular scarring. Right lung clear. No effusions or acute bony abnormality. IMPRESSION: No active disease. Electronically Signed   By: Rolm Baptise M.D.   On: 02/01/2020 09:49    EKG: Independently  reviewed.  Sinus tachycardia with rate 106; no evidence of acute ischemia   Labs on Admission: I have personally reviewed the available labs and imaging studies at the time of the admission.  Pertinent labs:   Na++ 120 CO2 19 Glucose 316 BUN 42/Creatinine 2.33/GFR 30 Anion gap 16 AST 75/ALT 109 WBC 24.6 - increased monocytes Hgb 11.7 Platelets 441 Lactate 1.8 A1c 11.3 TSH 1.216 BD COVID  negative; Hologic COVID negative Blood cultures pending UA/Urine Culture/UDS pending CK 179 Folate 15.3 Procalcitonin 2.66 Hep testing (acute) negative UA: 150 glucose, large Hgb, trace LE, 100 protein   Assessment/Plan Principal Problem:   Sepsis due to undetermined organism [facility]) Active Problems:   Type 2 diabetes mellitus, uncontrolled ([place])   Diabetic foot ulcer ([place])   Hyponatremia   Renal insufficiency   Weakness   Falls frequently   Sepsis -Patient with prior diagnosis of DM but no treatment for a decade or more now presenting with recurrent falls and weakness -SIRS criteria in this patient includes: Leukocytosis, fever, tachycardia, tachypnea  -Patient has evidence of acute organ failure with creatinine >2 that may not be easily explained by another condition. -While awaiting blood cultures, this appears to be a preseptic condition. -Sepsis protocol initiated -Suspected source is foot infection -Blood and urine cultures pending -Will admit due to: very poor social situation and likely need for prolonged hospitalization due to multiple untreated medical problems -Treat with IV Cefepime/Vanc/Flagyl for undifferentiated sepsis -Will add HIV (negative) -Will order sepsis procalcitonin level.  Antibiotics would not be indicated for PCT <0.1 and probably should not be used for < 0.25.  >0.5 indicates infection and >>0.5 indicates more serious disease.  As the procalcitonin level normalizes, it will be reasonable to consider de-escalation of antibiotic coverage.  B foot ulcers -B  but clearly R > L foot ulcers, likely necrotic  -Will treat with IV antibiotics -I have ordered ABIs in case revascularization may be indicated -Will order B foot MRIs to assess for osteo -LE wound order set utilized including labs (CRP, ESR, A1c, prealbumin, HIV, and blood cultures) and consults (TOC treatment; diabetes coordinator; peripheral vascular navigator; wound care; and nutrition)  Weakness/falls -May be multifactorial but due to possible neurologic deficits I called neuro to consult -MRI brain and C-spine are pending -PT/OT consults are pending  Psychiatric disease -Denies ETOH use -Reports h/o delusional d/o -Reports he is doing well from this standpoint  DM, uncontrolled -Apparently did not believe he had DM and so didn't go back to a physician since about 2008 -DM coordinator consult -A1c is 11.3 -Will cover with moderate-scale SSI for now  Hyponatremia -Likely slightly higher when adjusted for glucose and albumin -Gentle IVF hydration -F/u with BMP tomorrow  Renal insufficiency -Uncertain if acute or chronic since he has not sought medical care -Likely h/o HTN, definite h/o DM without treatment, so CKD appears likely  -Will gently hydrate and follow -Urinary retention, foley placed   Note: This patient has been tested and is negative for the novel coronavirus COVID-19.    DVT prophylaxis:  Lovenox Code Status:  Full - confirmed with patient Family Communication: None present; I spoke with his sister at the time of admission  Disposition Plan:  He may need placement, multiple consults requested and clinical course will depend on how his condition progresses Consults called: Neurology; TOC treatment; diabetes coordinator; peripheral vascular navigator; wound care; and nutrition  Admission status: Admit - It is my clinical opinion that admission to INPATIENT is reasonable and necessary because of the expectation that this patient will require hospital care that  crosses at least 2 midnights to treat this condition based on the medical complexity of the problems presented.  Given the aforementioned information, the predictability of an adverse outcome is felt to be significant.     Karmen Bongo MD [facility]   How to contact the [facility] Attending or Consulting provider [place] or  covering provider during after hours [place], for this patient?  1. Check the care team in [employer] and look for a) attending/consulting [facility] provider listed and b) the [facility] team listed 2. Log into [email or web address] and use [place]'s universal password to access. If you do not have the password, please contact the hospital operator. 3. Locate the [facility] provider you are looking for under [facility] and page to a number that you can be directly reached. 4. If you still have difficulty reaching the provider, please page the [facility] (Director on Call) for the Hospitalists listed on amion for assistance.   02/01/2020, 6:31 PM

## 2020-02-01 NOTE — Consult Note (Addendum)
Neurology Consultation  Reason for Consult: Generalized weakness, gait instability, falls Referring Physician: Ophelia Charter  CC: Generalized weakness, gait instability, falls  History is obtained from: Patient  HPI: Chris Bowman is a 59 y.o. male with history of uncontrolled diabetes, essential hypertension, delusional disorders.  Neurology was consulted secondary to patient's unsteady gait, generalized weakness and multiple falls.  Per patient her last 2 weeks he has had decreased sensation on his right buttocks and on his left buttocks. He also has noticed that he has had decreased sensation on his right arm from mid forearm to mid bicep circumferentially. Two Saturdays ago patient also noticed that he was generalized weak. He states that he was 60% of his normal strength. He also endorses for couple weeks that he has had pain on the bottom of his feet which she describes as burning/tingling/knives at times. He even states that he is afraid to take a shower because at times he not cannot control how he is moving around in the shower stall. What brought patient in today is secondary to the fact that he has been falling a lot. He definitely notes that if the lights are turned off he feels unsteady in the dark and often falls and or trips over objects. During preparation to do physical exam while talking to him I took off his socks and noticed several significant ulcers on his feet. Initially he stated that these had only been there for a few weeks however later in discussion he had noted that they have been there since 2019-06-13.  When asked about his nutrition, he states that he eats Ramen noodles, often times vegetables out of a can and admits that he does not eat healthy oftentimes due to financial aspects.  He does not take vitamins on a regular basis.  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  He has attempted to get help from the [place] however for multiple reasons he apparently was discharged from  the [facility].  He was on disability because of multiple reasons including inability to function in a workplace environment, possible antisocial behavior disorder and some other causes which she could not elucidate.  On a side note he also stated that he often times has a hard time starting the stream of urine however once it is started he is able to go to the bathroom. He denies ever having his prostate checked.  Denies alcohol abuse.  Denies drug use.  ED course  Relevant labs include -glucose 276, HbA1c 11.3, WBC 24.6, sodium 120, BUN 42, creatinine 2.33, albumin 2.3, AST 75, ALT 109  Past Medical History:  Diagnosis Date  . Delusional disorder (HCC)   . Essential hypertension   . Uncontrolled type 2 diabetes mellitus (HCC)     Family History  Problem Relation Age of Onset  . Diabetes Mother    Social History:   reports that he has never smoked. He has never used smokeless tobacco. He reports that he does not drink alcohol or use drugs.  Medications  Current Facility-Administered Medications:  .  acetaminophen (TYLENOL) tablet 650 mg, 650 mg, Oral, Q6H PRN, 325 mg at 02/01/20 1415 **OR** acetaminophen (TYLENOL) suppository 650 mg, 650 mg, Rectal, Q6H PRN, Jonah Blue, MD .  ceFEPIme (MAXIPIME) 2 g in sodium chloride 0.9 % 100 mL IVPB, 2 g, Intravenous, Q12H, von Dohlen, Haley B, RPH .  docusate sodium (COLACE) capsule 100 mg, 100 mg, Oral, BID, Jonah Blue, MD .  enoxaparin (LOVENOX) injection 40 mg,  40 mg, Subcutaneous, Q24H, Jonah Blue, MD .  insulin aspart (novoLOG) injection 0-15 Units, 0-15 Units, Subcutaneous, TID WC, Jonah Blue, MD, 8 Units at 02/01/20 1215 .  insulin aspart (novoLOG) injection 0-5 Units, 0-5 Units, Subcutaneous, QHS, Jonah Blue, MD .  lactated ringers infusion, , Intravenous, Continuous, Jonah Blue, MD, Stopped at 02/01/20 1422 .  metroNIDAZOLE (FLAGYL) IVPB 500 mg, 500 mg, Intravenous, Q8H, Jonah Blue, MD, Stopped at  02/01/20 1444 .  ondansetron (ZOFRAN) tablet 4 mg, 4 mg, Oral, Q6H PRN **OR** ondansetron (ZOFRAN) injection 4 mg, 4 mg, Intravenous, Q6H PRN, Jonah Blue, MD .  polyethylene glycol (MIRALAX / GLYCOLAX) packet 17 g, 17 g, Oral, Daily PRN, Jonah Blue, MD .  sodium chloride flush (NS) 0.9 % injection 3 mL, 3 mL, Intravenous, Q12H, Jonah Blue, MD .  Melene Muller ON 02/02/2020] vancomycin (VANCOREADY) IVPB 750 mg/150 mL, 750 mg, Intravenous, Q24H, von Dohlen, Haley B, RPH  Current Outpatient Medications:  .  acetaminophen (TYLENOL) 325 MG tablet, Take 650 mg by mouth every 6 (six) hours as needed for mild pain., Disp: , Rfl:   ROS:   General ROS: negative for - chills, fatigue, fever, night sweats, weight gain or weight loss Psychological ROS: negative for - behavioral disorder, hallucinations, memory difficulties, mood swings or suicidal ideation Ophthalmic ROS: negative for - blurry vision, double vision, eye pain or loss of vision ENT ROS: negative for - epistaxis, nasal discharge, oral lesions, sore throat, tinnitus or vertigo Allergy and Immunology ROS: negative for - hives or itchy/watery eyes Hematological and Lymphatic ROS: negative for - bleeding problems, bruising or swollen lymph nodes Endocrine ROS: Positive for -  polydipsia/polyuria Respiratory ROS: negative for - cough, hemoptysis, shortness of breath or wheezing Cardiovascular ROS: negative for - chest pain, dyspnea on exertion, edema or irregular heartbeat Gastrointestinal ROS: negative for - abdominal pain, diarrhea, hematemesis, nausea/vomiting or stool incontinence Genito-Urinary ROS: negative for - dysuria, hematuria, incontinence or urinary frequency/urgency Musculoskeletal ROS: Positive for - joint swelling or muscular weakness Neurological ROS: as noted in HPI Dermatological ROS: Positive for rash and skin lesion changes  Exam: Current vital signs: BP (!) 131/98   Pulse 99   Temp (!) 102.1 F (38.9 C) (Oral)    Resp 18   Ht 5\' 8"  (1.727 m)   Wt 68 kg   SpO2 96%   BMI 22.81 kg/m  Vital signs in last 24 hours: Temp:  [98.3 F (36.8 C)-102.1 F (38.9 C)] 102.1 F (38.9 C) (03/04 1400) Pulse Rate:  [93-112] 99 (03/04 1330) Resp:  [10-24] 18 (03/04 1330) BP: (104-148)/(66-98) 131/98 (03/04 1330) SpO2:  [91 %-99 %] 96 % (03/04 1330) Weight:  [68 kg] 68 kg (03/04 0801)  Constitutional: Appears well-developed and not well nourished.  Psych: Affect appropriate to situation Eyes: No scleral injection HENT: No OP obstrucion, supple neck. Head: Normocephalic.  Cardiovascular: Normal rate and regular rhythm.  Respiratory: Effort normal, non-labored breathing GI: Soft.  No distension. There is no tenderness.  Skin: WDI with multiple lesions on the legs and ulcers on his feet Neuro: Mental Status: Patient is awake, alert, oriented to person, place, month, year, and situation. Speech-intact naming, repeating, comprehension Patient is able to give a history however at times there are changes in the history throughout the conversation Cranial Nerves: II: Visual Fields are full.  III,IV, VI: EOMI without ptosis or diploplia. Pupils equal, round and reactive to light V: Facial sensation is symmetric to temperature VII: Facial movement is symmetric.  VIII:  hearing is intact to voice X: Palat elevates symmetrically XI: Shoulder shrug is symmetric. XII: tongue is midline without atrophy or fasciculations.  Motor: Tone is normal. Bulk is normal. 5/5 strength was present in all four extremities. With the exception of his right shoulder which likely is secondary to a rotator cuff tear. He is able to lift his arm approximately 30 degrees. No drift and no asterixis noted.  Coarse tremor bilaterally. Sensory: Patient has decreased sensation from feet to upper thigh with cold touch, pinprick, vibratory sensation and he has no proprioception in his feet/big toe Deep Tendon Reflexes: No deep tendon reflexes at  the Achilles, knee and barely 1+ at the brachioradialis Plantars: Mute bilaterally Cerebellar: FNF intact bilaterally  Labs I have reviewed labs in epic and the results pertinent to this consultation are:  CBC    Component Value Date/Time   WBC 24.6 (H) 02/01/2020 0805   RBC 5.18 02/01/2020 0805   HGB 11.7 (L) 02/01/2020 0805   HCT 36.5 (L) 02/01/2020 0805   PLT 441 (H) 02/01/2020 0805   MCV 70.5 (L) 02/01/2020 0805   MCH 22.6 (L) 02/01/2020 0805   MCHC 32.1 02/01/2020 0805   RDW 14.7 02/01/2020 0805   LYMPHSABS 0.7 02/01/2020 0930   MONOABS 1.6 (H) 02/01/2020 0930   EOSABS 0.0 02/01/2020 0930   BASOSABS 0.1 02/01/2020 0930   CMP     Component Value Date/Time   NA 120 (L) 02/01/2020 0805   K 4.2 02/01/2020 0805   CL 85 (L) 02/01/2020 0805   CO2 19 (L) 02/01/2020 0805   GLUCOSE 316 (H) 02/01/2020 0805   BUN 42 (H) 02/01/2020 0805   CREATININE 2.33 (H) 02/01/2020 0805   CALCIUM 8.6 (L) 02/01/2020 0805   PROT 8.2 (H) 02/01/2020 0805   ALBUMIN 2.3 (L) 02/01/2020 0805   AST 75 (H) 02/01/2020 0805   ALT 109 (H) 02/01/2020 0805   ALKPHOS 317 (H) 02/01/2020 0805   BILITOT 1.2 02/01/2020 0805   GFRNONAA 30 (L) 02/01/2020 0805   GFRAA 35 (L) 02/01/2020 0805   Imaging I have reviewed the images obtained:  Etta Quill PA-C [facility] [phone]  M-F  (9:00 am- 5:00 PM)  02/01/2020, 3:26 PM   Attending addendum Patient seen and examined Agree with the history and physical above-some additions made below. Neurology consultation obtained for multiple falls, gait instability.  Patient last seen a physician 17 years ago.  Complains of frequent falls, inability to feel legs and hands and paresthesias along with some cognitive deficits as well. Patient reports living alone, poor diet that does not include all food groups, inability to work for the past many years due to mental health issues. Agree with the examination above that I also independently verified. I  have reviewed imaging.  No brain imaging to review.  Chest x-ray unremarkable for acute process.  Assessment: This is a 59 year old male presenting to the hospital for generalized weakness, gait instability and multiple falls.  Examination is consistent for significant peripheral neuropathy-global stocking pattern with nonhealing ulcers on both the hands and feet. Labs reveal leukocytosis, severe hyperglycemia, severe hyponatremia and deranged renal function. At this time patient's significant neuropathy could be multifactorial such as hyponatremia, B1, B12, B6 deficiencies. In addition he may have had contact with heavy metals in the past and heavy metal poisoning could also present with severe sensory loss and sensory ataxia. As he is stated he has had multiple falls in the past thus cannot rule out possible cervical spine  injury or other spinal compressive lesions which needs to be kept in differential due to leukocytosis-such as an abscess.  Impression: -Frequent Falls likely secondary to sensory ataxia and peripheral neuropathy -Peripheral neuropathy-multifactorial-uncontrolled diabetes (A1c of 11.3), vitamin deficiencies -Plantar surface ulcerations -Possible cervical spine abnormality or myelopathy which is masked by his significant neuropathy -Severe hyponatremia -Clinical findings not consistent with meningitis or encephalitis hence no need for spinal tap at this time. -Undiagnosed psychiatric illness/personality disorder -possibly benefit from psychiatry consultation -Cognitive impairment-also likely multifactorial due to the above reasons  Recommendations: -Fall precautions -Obtain labs of B1, B12, B6, folate, heavy metal screen, CK -Replete thiamine-high dose after drawing levels. -Obtain MRI of cervical spine and brain without contrast given deranged renal function. -PT/OT -Correct metabolic abnormalities and infectious processes per primary team. -Check urinalysis -Nutrition  consult -Wound care consult -Possibly has an undiagnosed psychiatric condition-needs a psychiatry consult as well.  -- Milon Dikes, MD [facility] Pager: [phone] If 7pm to 7am, please call on call as listed on AMION.

## 2020-02-01 NOTE — ED Triage Notes (Signed)
Pt reports increased generalized weakness for the past couple weeks, pt had two falls in the past 2 days. Abrasion noted to left hand, pt denies hitting his head. C.o right arm pain and tailbone pain from the fall. Pt does not use any assistance when ambulating at home, pt lives alone.

## 2020-02-02 ENCOUNTER — Inpatient Hospital Stay (HOSPITAL_COMMUNITY): Payer: Non-veteran care

## 2020-02-02 DIAGNOSIS — A419 Sepsis, unspecified organism: Secondary | ICD-10-CM

## 2020-02-02 DIAGNOSIS — R7881 Bacteremia: Secondary | ICD-10-CM

## 2020-02-02 DIAGNOSIS — E1169 Type 2 diabetes mellitus with other specified complication: Secondary | ICD-10-CM

## 2020-02-02 DIAGNOSIS — L97519 Non-pressure chronic ulcer of other part of right foot with unspecified severity: Secondary | ICD-10-CM

## 2020-02-02 DIAGNOSIS — I10 Essential (primary) hypertension: Secondary | ICD-10-CM

## 2020-02-02 DIAGNOSIS — E11621 Type 2 diabetes mellitus with foot ulcer: Secondary | ICD-10-CM

## 2020-02-02 DIAGNOSIS — L97529 Non-pressure chronic ulcer of other part of left foot with unspecified severity: Secondary | ICD-10-CM

## 2020-02-02 DIAGNOSIS — Z794 Long term (current) use of insulin: Secondary | ICD-10-CM

## 2020-02-02 DIAGNOSIS — L0390 Cellulitis, unspecified: Secondary | ICD-10-CM

## 2020-02-02 DIAGNOSIS — R296 Repeated falls: Secondary | ICD-10-CM

## 2020-02-02 DIAGNOSIS — M869 Osteomyelitis, unspecified: Secondary | ICD-10-CM

## 2020-02-02 DIAGNOSIS — E871 Hypo-osmolality and hyponatremia: Secondary | ICD-10-CM

## 2020-02-02 DIAGNOSIS — Z885 Allergy status to narcotic agent status: Secondary | ICD-10-CM

## 2020-02-02 DIAGNOSIS — N179 Acute kidney failure, unspecified: Secondary | ICD-10-CM

## 2020-02-02 DIAGNOSIS — M86171 Other acute osteomyelitis, right ankle and foot: Secondary | ICD-10-CM | POA: Diagnosis present

## 2020-02-02 DIAGNOSIS — B9561 Methicillin susceptible Staphylococcus aureus infection as the cause of diseases classified elsewhere: Secondary | ICD-10-CM

## 2020-02-02 DIAGNOSIS — E1165 Type 2 diabetes mellitus with hyperglycemia: Secondary | ICD-10-CM

## 2020-02-02 LAB — CBC
HCT: 30.2 % — ABNORMAL LOW (ref 39.0–52.0)
Hemoglobin: 9.8 g/dL — ABNORMAL LOW (ref 13.0–17.0)
MCH: 22.3 pg — ABNORMAL LOW (ref 26.0–34.0)
MCHC: 32.5 g/dL (ref 30.0–36.0)
MCV: 68.8 fL — ABNORMAL LOW (ref 80.0–100.0)
Platelets: 303 10*3/uL (ref 150–400)
RBC: 4.39 MIL/uL (ref 4.22–5.81)
RDW: 14.7 % (ref 11.5–15.5)
WBC: 18.4 10*3/uL — ABNORMAL HIGH (ref 4.0–10.5)
nRBC: 0 % (ref 0.0–0.2)

## 2020-02-02 LAB — BLOOD CULTURE ID PANEL (REFLEXED)

## 2020-02-02 LAB — BASIC METABOLIC PANEL
Anion gap: 12 (ref 5–15)
BUN: 34 mg/dL — ABNORMAL HIGH (ref 6–20)
CO2: 19 mmol/L — ABNORMAL LOW (ref 22–32)
Calcium: 8 mg/dL — ABNORMAL LOW (ref 8.9–10.3)
Chloride: 90 mmol/L — ABNORMAL LOW (ref 98–111)
Creatinine, Ser: 1.68 mg/dL — ABNORMAL HIGH (ref 0.61–1.24)
GFR calc Af Amer: 51 mL/min — ABNORMAL LOW (ref >60)
GFR calc non Af Amer: 44 mL/min — ABNORMAL LOW (ref >60)
Glucose, Bld: 271 mg/dL — ABNORMAL HIGH (ref 70–99)
Potassium: 4 mmol/L (ref 3.5–5.1)
Sodium: 121 mmol/L — ABNORMAL LOW (ref 135–145)

## 2020-02-02 LAB — PREALBUMIN: Prealbumin: 5.3 mg/dL — ABNORMAL LOW (ref 18–38)

## 2020-02-02 LAB — GLUCOSE, CAPILLARY
Glucose-Capillary: 271 mg/dL — ABNORMAL HIGH (ref 70–99)
Glucose-Capillary: 270 mg/dL — ABNORMAL HIGH (ref 70–99)
Glucose-Capillary: 147 mg/dL — ABNORMAL HIGH (ref 70–99)
Glucose-Capillary: 323 mg/dL — ABNORMAL HIGH (ref 70–99)

## 2020-02-02 LAB — C-REACTIVE PROTEIN: CRP: 14.3 mg/dL — ABNORMAL HIGH (ref <1.0)

## 2020-02-02 LAB — MRSA PCR SCREENING: MRSA by PCR: NEGATIVE

## 2020-02-02 LAB — SEDIMENTATION RATE: Sed Rate: 84 mm/hr — ABNORMAL HIGH (ref 0–16)

## 2020-02-02 LAB — ECHOCARDIOGRAM COMPLETE
Height: 68 in
Weight: 2927.71 oz

## 2020-02-02 LAB — AMMONIA: Ammonia: 18 umol/L (ref 9–35)

## 2020-02-02 LAB — HEAVY METALS, BLOOD
Arsenic: 6 ug/L (ref 2–23)
Lead: <1 ug/dL (ref 0–4)
Mercury: <1 ug/L (ref 0.0–14.9)

## 2020-02-02 IMAGING — US US ABDOMEN COMPLETE
1 series · 13 of 25 positions shown · non-contrast
Comparison: None.

CLINICAL DATA: Elevated liver function tests. Renal insufficiency
with elevated creatinine.

EXAM:
ABDOMEN ULTRASOUND COMPLETE

[Series 1: us abdomen complete · 13 of 60 slices shown]
[im 1/60]
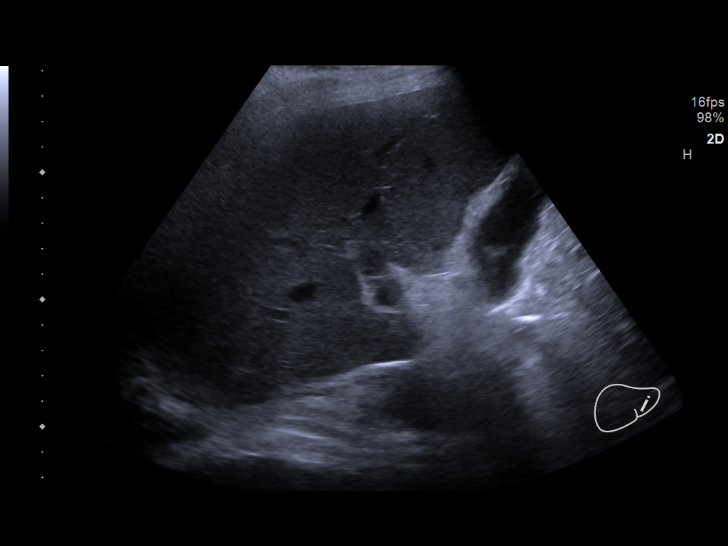
[im 5/60]
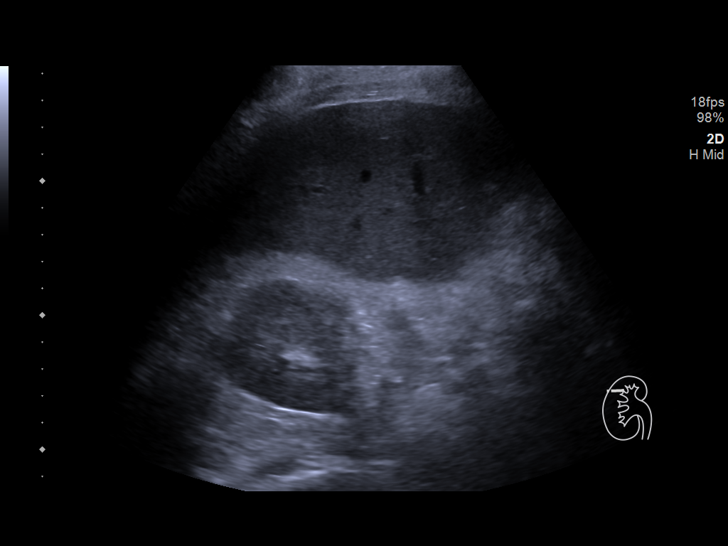
[im 10/60]
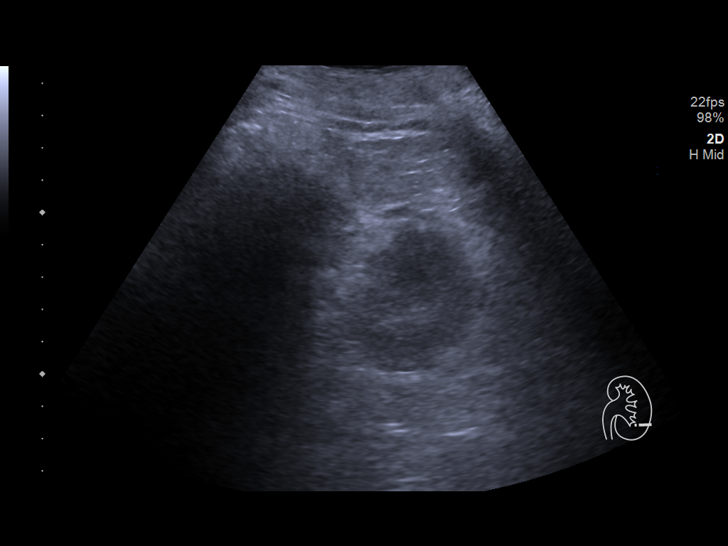
[im 15/60]
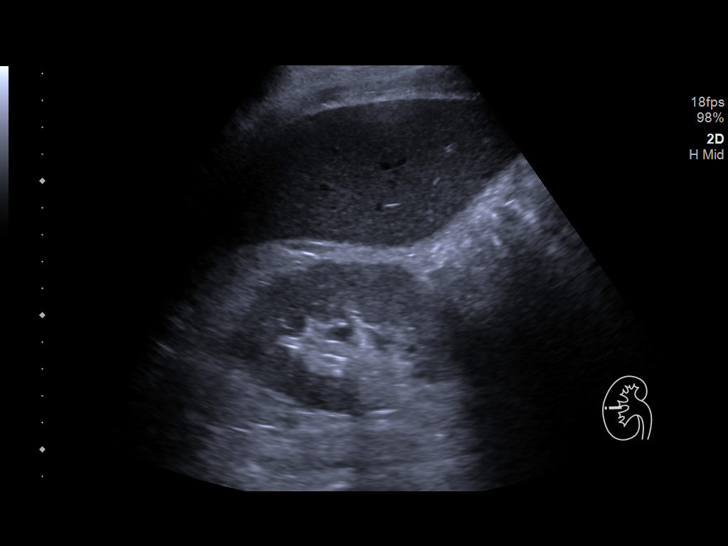
[im 20/60]
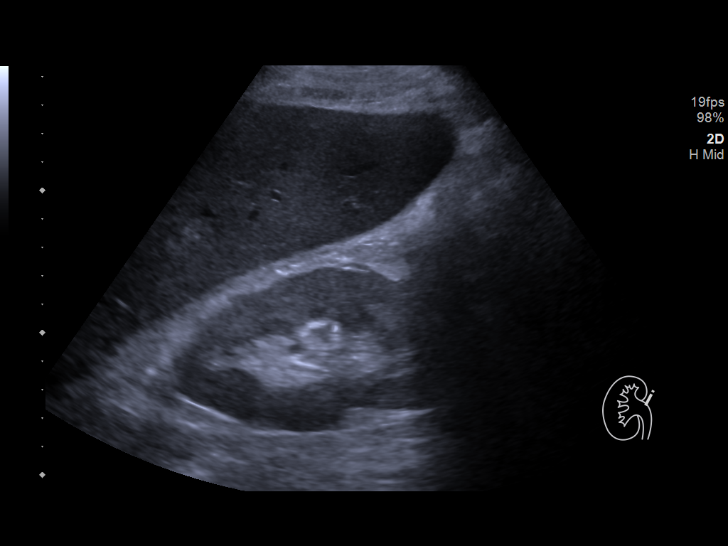
[im 25/60]
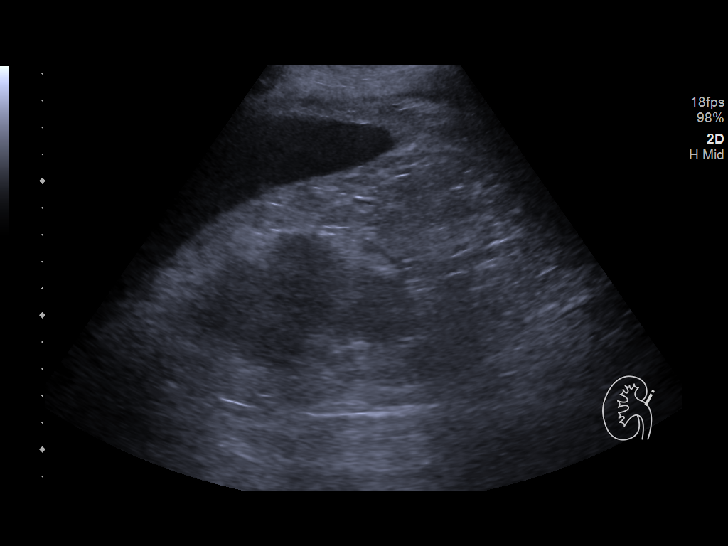
[im 30/60]
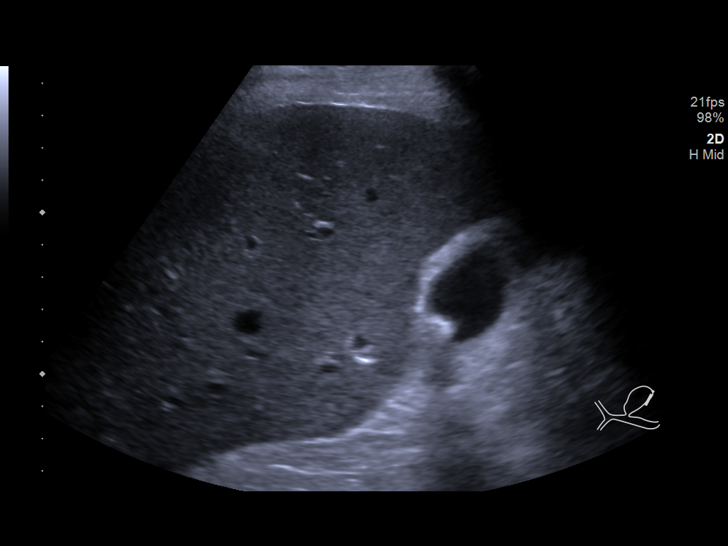
[im 35/60]
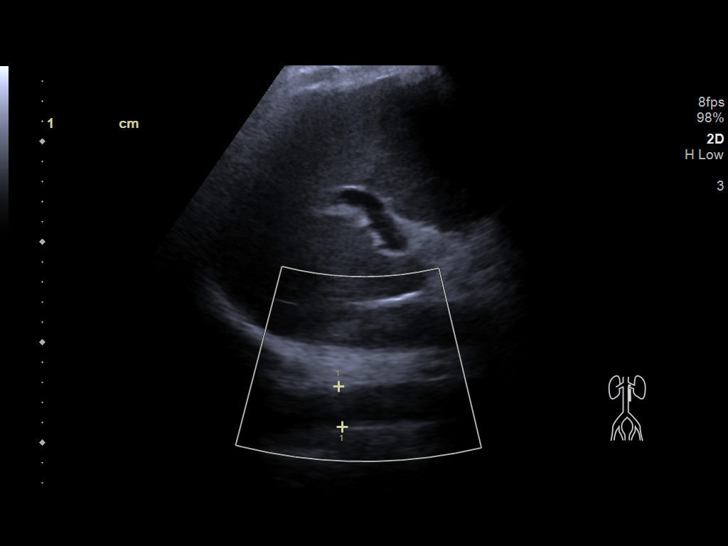
[im 40/60]
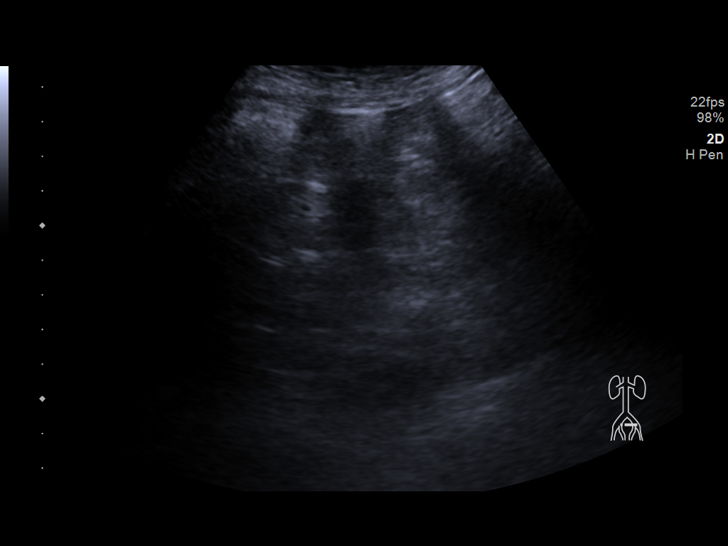
[im 45/60]
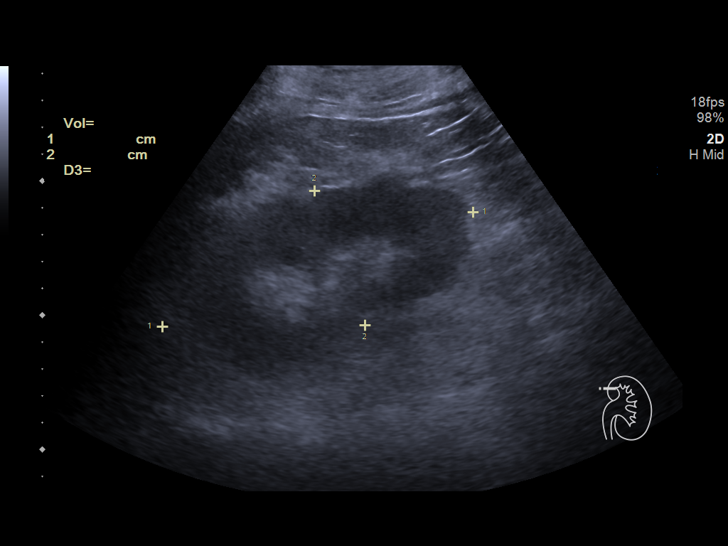
[im 50/60]
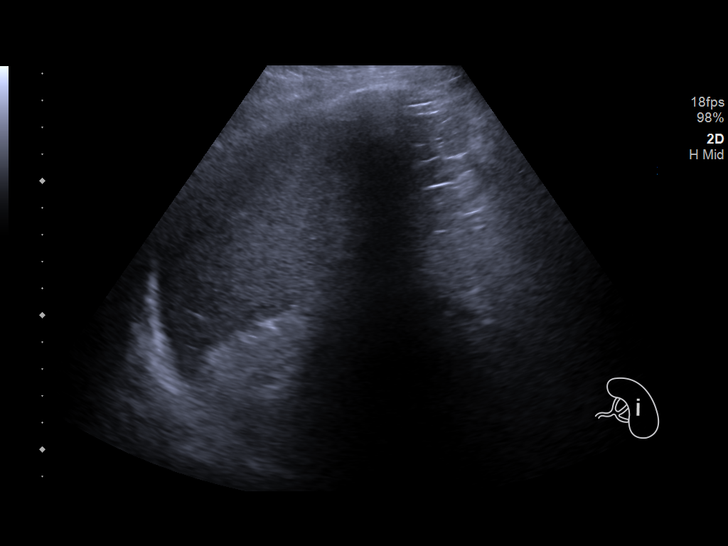
[im 55/60]
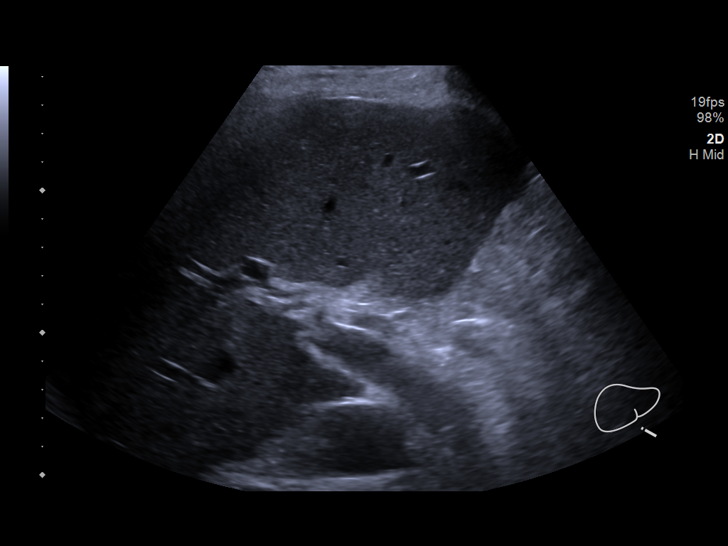
[im 60/60]
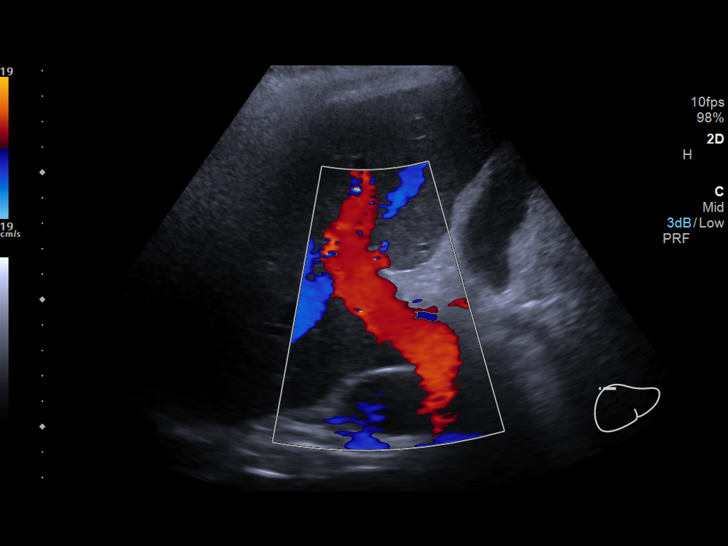

[13 of 25 positions shown; findings below may reference images not displayed]

FINDINGS: Gallbladder: Several gallstones are seen, largest measuring 1.2 cm.
Gallbladder is incompletely distended. No abnormal gallbladder wall
thickening or pericholecystic fluid noted. No sonographic Murphy
sign noted by sonographer.

Common bile duct: Diameter: 3 mm, within normal limits.

Liver: No focal lesion identified. Within normal limits in
parenchymal echogenicity. Portal vein is patent on color Doppler
imaging with normal direction of blood flow towards the liver.

IVC: No abnormality visualized.

Pancreas: Pancreas is not visualized due to overlying bowel gas.

Spleen: Size and appearance within normal limits.

Right Kidney: Length: 11.3 cm. Mildly increased parenchymal
echogenicity noted. No mass or hydronephrosis visualized.

Left Kidney: Length: 12.3 cm. Mildly increased parenchymal
echogenicity noted. No mass or hydronephrosis visualized.

Abdominal aorta: No aneurysm visualized, although distal abdominal
aorta obscured by overlying bowel gas.

Other findings: None.
IMPRESSION: Cholelithiasis. No sonographic evidence of cholecystitis or biliary
ductal dilatation.

Mildly increased renal parenchymal echogenicity, consistent with
medical renal disease. No evidence of hydronephrosis.

## 2020-02-02 IMAGING — US US ABDOMEN COMPLETE
1 series · 13 of 23 positions shown · non-contrast
Comparison: None.

CLINICAL DATA: Elevated liver function tests. Renal insufficiency
with elevated creatinine.

EXAM:
ABDOMEN ULTRASOUND COMPLETE

[Series 1: us abdomen complete · 13 of 23 slices shown]
[im 1/23]
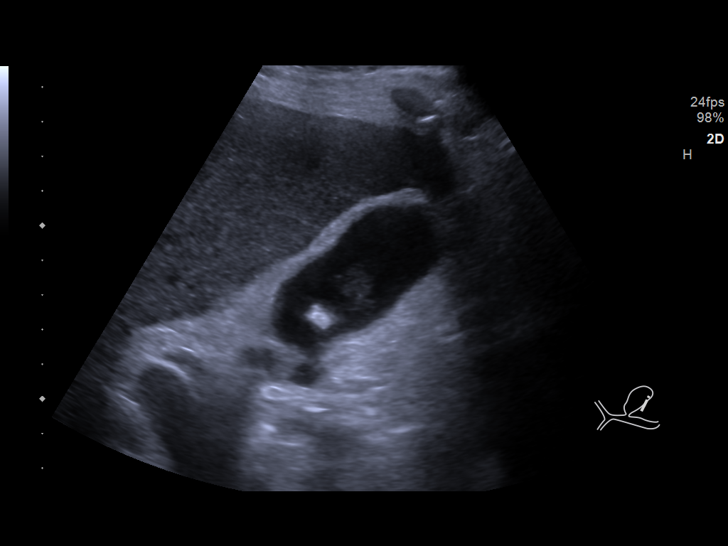
[im 3/23]
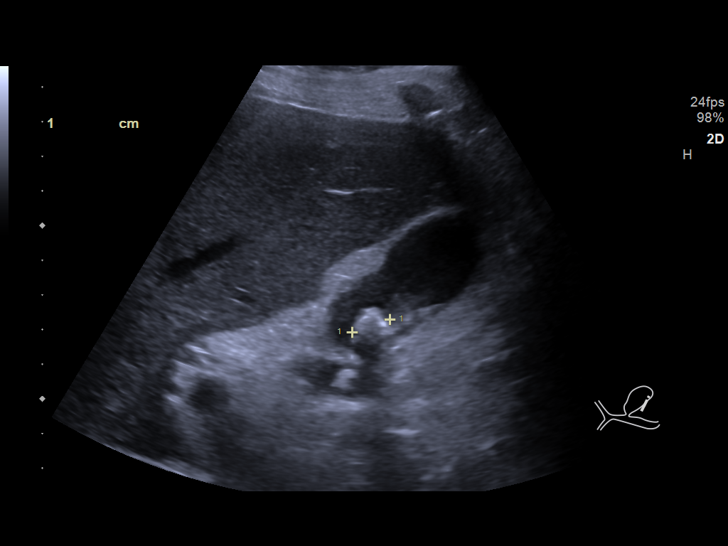
[im 5/23]
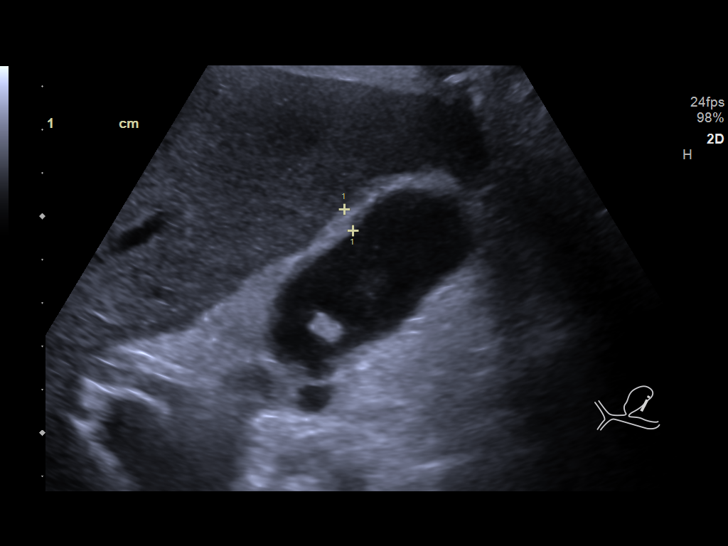
[im 7/23]
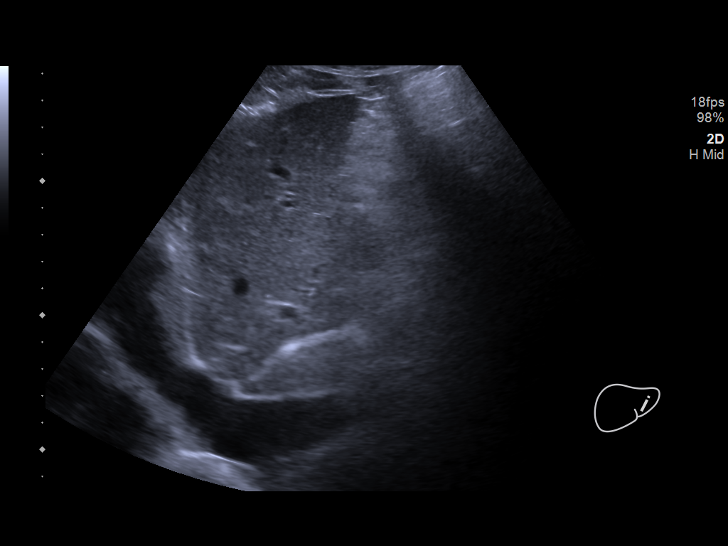
[im 8/23]
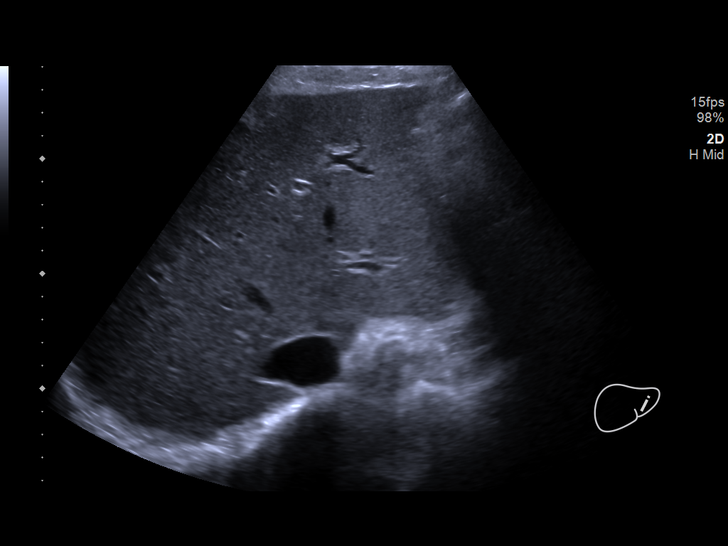
[im 10/23]
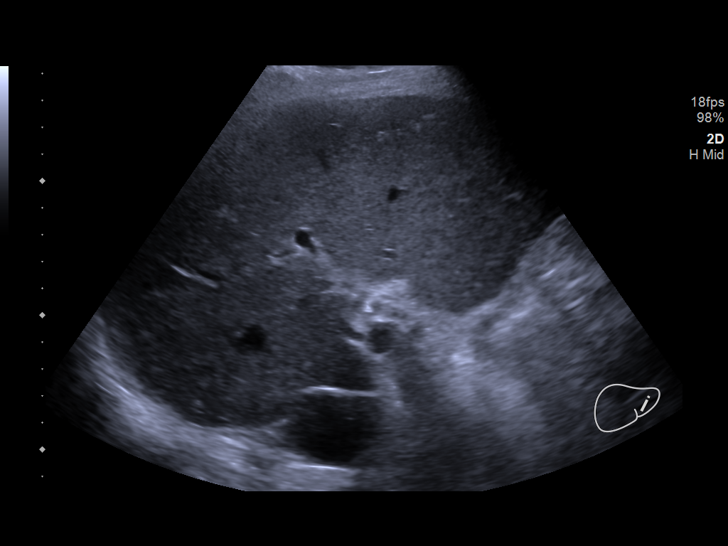
[im 12/23]
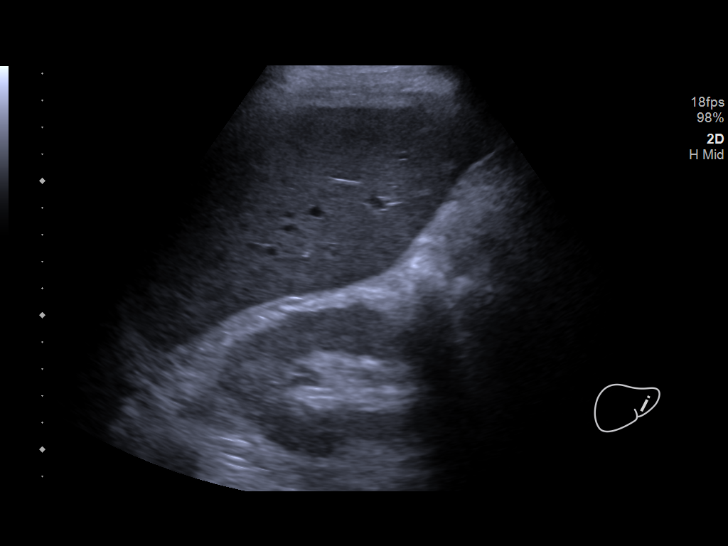
[im 14/23]
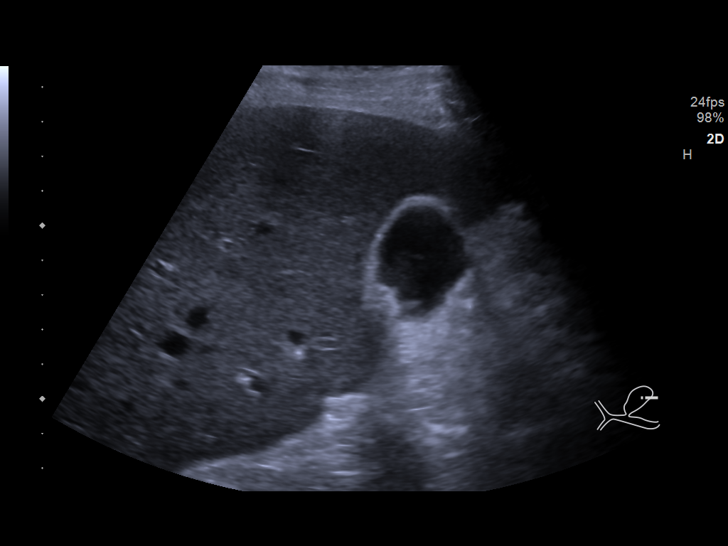
[im 16/23]
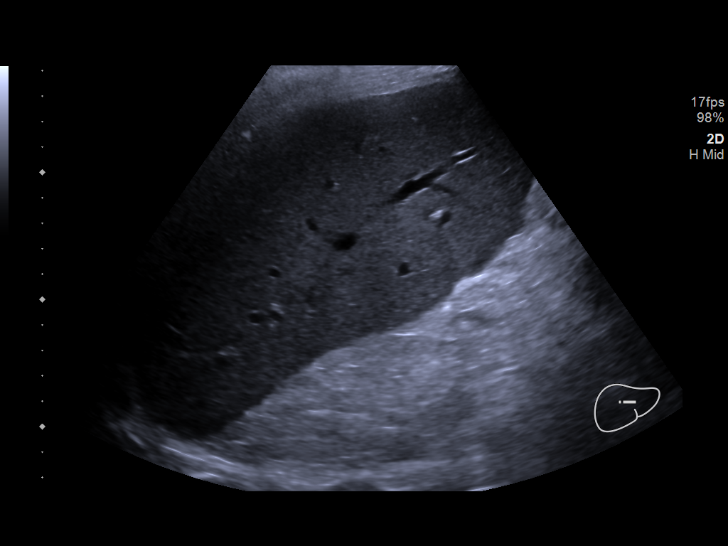
[im 17/23]
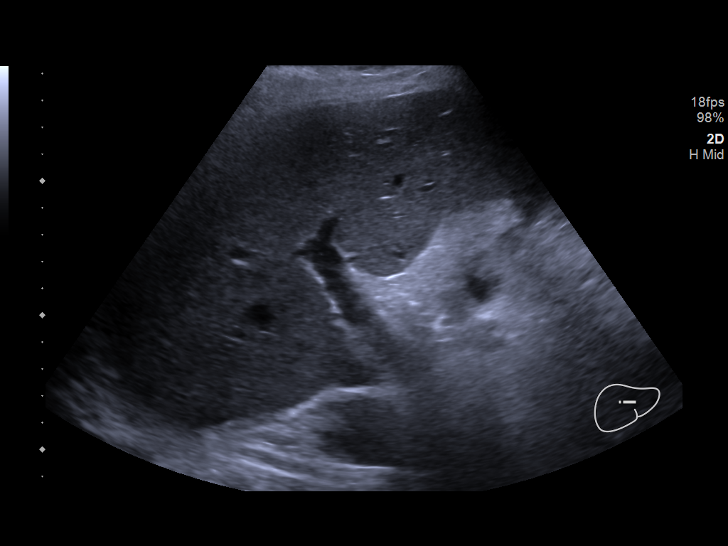
[im 19/23]
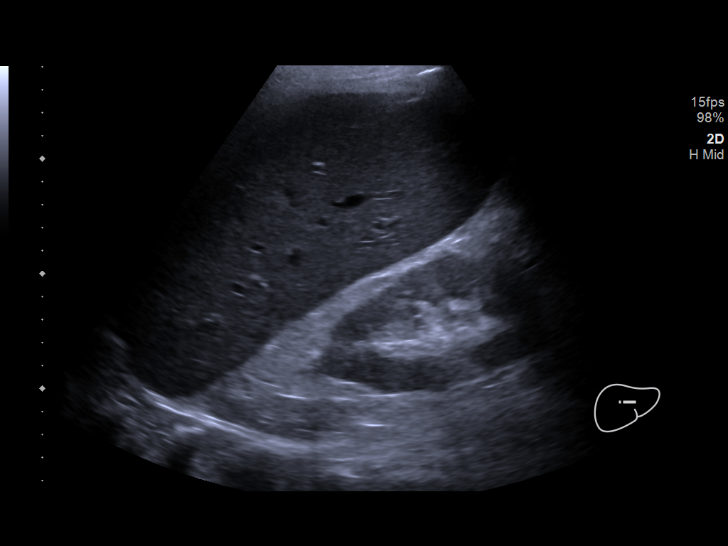
[im 21/23]
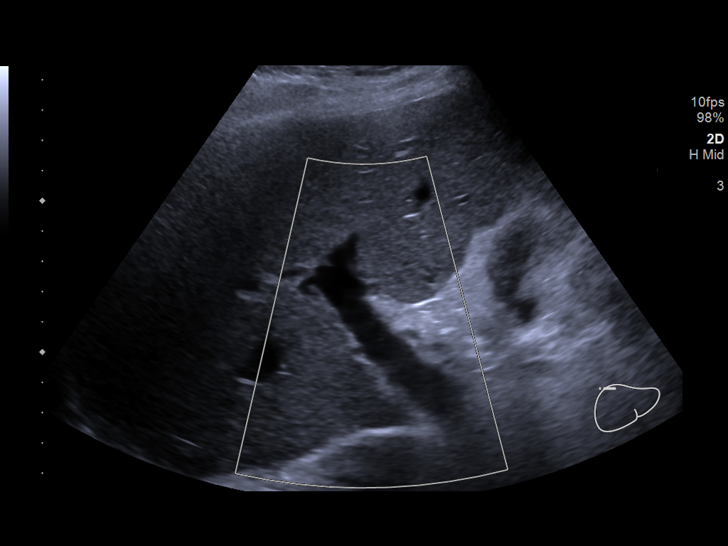
[im 23/23]
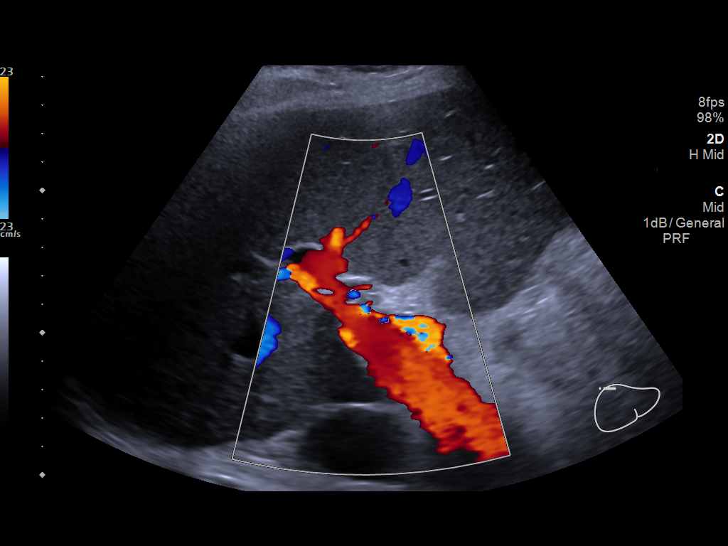

[13 of 23 positions shown; findings below may reference images not displayed]

FINDINGS: Gallbladder: Several gallstones are seen, largest measuring 1.2 cm.
Gallbladder is incompletely distended. No abnormal gallbladder wall
thickening or pericholecystic fluid noted. No sonographic Murphy
sign noted by sonographer.

Common bile duct: Diameter: 3 mm, within normal limits.

Liver: No focal lesion identified. Within normal limits in
parenchymal echogenicity. Portal vein is patent on color Doppler
imaging with normal direction of blood flow towards the liver.

IVC: No abnormality visualized.

Pancreas: Pancreas is not visualized due to overlying bowel gas.

Spleen: Size and appearance within normal limits.

Right Kidney: Length: 11.3 cm. Mildly increased parenchymal
echogenicity noted. No mass or hydronephrosis visualized.

Left Kidney: Length: 12.3 cm. Mildly increased parenchymal
echogenicity noted. No mass or hydronephrosis visualized.

Abdominal aorta: No aneurysm visualized, although distal abdominal
aorta obscured by overlying bowel gas.

Other findings: None.
IMPRESSION: Cholelithiasis. No sonographic evidence of cholecystitis or biliary
ductal dilatation.

Mildly increased renal parenchymal echogenicity, consistent with
medical renal disease. No evidence of hydronephrosis.

## 2020-02-02 MED ORDER — POVIDONE-IODINE 10 % EX SWAB: 2.0000 "application " | Freq: Once | CUTANEOUS | Status: DC

## 2020-02-02 MED ORDER — ENSURE PRE-SURGERY PO LIQD
296.0000 mL | Freq: Once | ORAL | Status: AC
  Administered 2020-02-03: 296 mL via ORAL
  Filled 2020-02-02 (×2): qty 296

## 2020-02-02 MED ORDER — CEFAZOLIN SODIUM-DEXTROSE 2-4 GM/100ML-% IV SOLN
2.0000 g | Freq: Three times a day (TID) | INTRAVENOUS | Status: DC
  Administered 2020-02-02 – 2020-03-01 (×86): 2 g via INTRAVENOUS
  Filled 2020-02-02 (×88): qty 100

## 2020-02-02 MED ORDER — CHLORHEXIDINE GLUCONATE 4 % EX LIQD
60.0000 mL | Freq: Once | CUTANEOUS | Status: DC
  Filled 2020-02-02: qty 60

## 2020-02-02 MED ORDER — INSULIN DETEMIR 100 UNIT/ML ~~LOC~~ SOLN
6.0000 [IU] | Freq: Two times a day (BID) | SUBCUTANEOUS | Status: DC
  Administered 2020-02-02 – 2020-02-04 (×3): 6 [IU] via SUBCUTANEOUS
  Filled 2020-02-02 (×5): qty 0.06

## 2020-02-02 MED ORDER — GLUCERNA SHAKE PO LIQD
237.0000 mL | Freq: Three times a day (TID) | ORAL | Status: DC
  Administered 2020-02-02 – 2020-02-13 (×15): 237 mL via ORAL

## 2020-02-02 MED ORDER — LIVING WELL WITH DIABETES BOOK
Freq: Once | Status: AC
  Filled 2020-02-02: qty 1

## 2020-02-02 MED ORDER — ENOXAPARIN SODIUM 40 MG/0.4ML ~~LOC~~ SOLN: 40.0000 mg | SUBCUTANEOUS | Status: DC

## 2020-02-02 MED ORDER — INSULIN STARTER KIT- PEN NEEDLES (ENGLISH)
1.0000 | Freq: Once | Status: AC
  Administered 2020-02-02: 1
  Filled 2020-02-02: qty 1

## 2020-02-02 MED ORDER — ADULT MULTIVITAMIN W/MINERALS CH
1.0000 | ORAL_TABLET | Freq: Every day | ORAL | Status: DC
  Administered 2020-02-02 – 2020-03-01 (×28): 1 via ORAL
  Filled 2020-02-02 (×28): qty 1

## 2020-02-02 NOTE — Progress Notes (Signed)
Initial Nutrition Assessment  DOCUMENTATION CODES:   Not applicable  INTERVENTION:    Glucerna Shake po TID, each supplement provides 220 kcal and 10 grams of protein  MVI daily   NUTRITION DIAGNOSIS:   Increased nutrient needs related to wound healing as evidenced by estimated needs.  GOAL:   Patient will meet greater than or equal to 90% of their needs  MONITOR:   PO intake, Supplement acceptance, Weight trends, Labs, I & O's, Skin  REASON FOR ASSESSMENT:   Consult Assessment of nutrition requirement/status  ASSESSMENT:   Patient with PMH significant for delusional disorder, essential HTN, and uncontrolled DM. Presents this admission with sepsis likely 2/2 to bilateral foot ulcers.   Pt confused upon assessment. States he had great appetite PTA. Typically eats two meals daily but unable to provide meal composition details. Eager to eat this admission. Pt willing to try supplementation.   Will re-attempt to educate pt on eating for diabetes when mental status clears if possible.   Pt reports a UBW of 139 lb or 150 lb and is unsure if lost weight recently. Records are limited in weight history.   I/O: -1,228 ml since admit  UOP: 3,900 ml x 24 hrs   Drips: LR @ 100 ml/hr  Medications: colace, SS novolog Labs: Na 121 (L) CBG 270-338 A1C 11.3 (H)  NUTRITION - FOCUSED PHYSICAL EXAM:    Most Recent Value  Orbital Region  No depletion  Upper Arm Region  Mild depletion  Thoracic and Lumbar Region  Unable to assess  Buccal Region  No depletion  Temple Region  No depletion  Clavicle Bone Region  Mild depletion  Clavicle and Acromion Bone Region  Mild depletion  Scapular Bone Region  Unable to assess  Dorsal Hand  No depletion  Patellar Region  No depletion  Anterior Thigh Region  No depletion  Posterior Calf Region  No depletion  Edema (RD Assessment)  Mild  Hair  Reviewed  Eyes  Reviewed  Mouth  Reviewed  Skin  Reviewed  Nails  Reviewed     Diet Order:    Diet Order            Diet Carb Modified Fluid consistency: Thin; Room service appropriate? Yes  Diet effective now              EDUCATION NEEDS:   Not appropriate for education at this time  Skin:  Skin Assessment: Skin Integrity Issues: Skin Integrity Issues:: Diabetic Ulcer Diabetic Ulcer: bilateral feet  Last BM:  3/4  Height:   Ht Readings from Last 1 Encounters:  02/01/20 5\' 8"  (1.727 m)    Weight:   Wt Readings from Last 1 Encounters:  02/01/20 83 kg    Ideal Body Weight:  70 kg  BMI:  Body mass index is 27.82 kg/m.  Estimated Nutritional Needs:   Kcal:  2100-2300 kcal  Protein:  105-120 grams  Fluid:  >/= 2.1 L/day   04/02/20 RD, LDN Clinical Nutrition Pager listed in AMION

## 2020-02-02 NOTE — Consult Note (Signed)
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 Continue on cefazolin 2. Please get TTE to evaluate for endocarditis 3. Have orthopedics evaluate feet for consideration of amputation of affected areas 4. Repeat blood cx tomorrw 5. Minimum of 2 wks but may need to extend depending if having further evidence of osteomyelitis   Principal Problem:   Sepsis due to undetermined organism [facility]) Active Problems:   Type 2 diabetes mellitus, uncontrolled ([place])   Diabetic foot ulcer ([place])   Hyponatremia   Renal insufficiency   Weakness   Falls frequently   . Chlorhexidine Gluconate Cloth  6 each Topical Daily  . docusate sodium  100 mg Oral BID  . enoxaparin (LOVENOX) injection  40 mg Subcutaneous Q24H  . insulin aspart  0-15 Units Subcutaneous TID WC  . insulin aspart  0-5 Units Subcutaneous QHS  . insulin starter kit- pen needles  1 kit Other Once  . sodium chloride flush  3 mL Intravenous Q12H    HPI: Chris Bowman is a 59 y.o. male with uncontrolled T2DM, HTN, who has had DFU for roughly a year but has not followed up regularly with any health practitioners. He has felt poorly a few weeks ago some decrease sensation to feet, occasional pain to bilateral feet. Wounds are worse. Subjective chills.  Review of Systems: Constitutional: Negative for fever,POSITIVE chills, diaphoresis, activity change, appetite change, fatigue and unexpected weight change.  HENT: Negative for congestion, sore throat, rhinorrhea, sneezing, trouble swallowing and sinus pressure.  Eyes: Negative for photophobia and visual disturbance.  Respiratory: Negative for cough, chest tightness, shortness of breath, wheezing and stridor.  Cardiovascular: Negative for chest pain, palpitations  and leg swelling.  Gastrointestinal: Negative for nausea, vomiting, abdominal pain, diarrhea, constipation, blood in stool, abdominal distention and anal bleeding.  Genitourinary: Negative for dysuria, hematuria, flank pain and difficulty urinating.  Musculoskeletal: Negative for myalgias, back pain, joint swelling, arthralgias and gait problem.  Skin: POSITIVE wounds. Negative for color change, pallor, rash Neurological: Negative for dizziness, tremors, weakness and light-headedness.  Hematological: Negative for adenopathy. Does not bruise/bleed easily.  Psychiatric/Behavioral: Negative for behavioral problems, confusion, sleep disturbance, dysphoric mood, decreased concentration and agitation.     Past Medical History:  Diagnosis Date  . Delusional disorder ([place])   . Essential hypertension   . Uncontrolled type 2 diabetes mellitus ([facility])     Social History   Tobacco Use  . Smoking status: Never Smoker  . Smokeless tobacco: Never Used  Substance Use Topics  . Alcohol use: Never  . Drug use: Never    Family History  Problem Relation Age of Onset  . Diabetes Mother    Allergies  Allergen Reactions  . Codeine     Kept sedated     OBJECTIVE: Blood pressure 122/63, pulse 97, temperature (!) 100.6 F (38.1 C), temperature source Oral, resp. rate 18, height 5' 8"  (1.727 m), weight 83 kg, SpO2 94 %. Physical Exam  Constitutional: He is oriented to person, place, and time. He appears well-developed and well-nourished. No distress.  HENT:  Mouth/Throat: Oropharynx is clear and moist. No oropharyngeal exudate.  Cardiovascular: Normal rate, regular rhythm and normal heart sounds. Exam reveals no gallop and no friction rub.  No  murmur heard.  Pulmonary/Chest: Effort normal and breath sounds normal. No respiratory distress. He has no wheezes.  Abdominal: Soft. Bowel sounds are normal. He exhibits no distension. There is no tenderness.  Lymphadenopathy:  He has no cervical  adenopathy.  Neurological: He is alert and oriented to person, place, and time.  Skin: eschar to plantar aspect of feet. Right foot near 1st MTP and 5th MTP, left foot 1st MTP, deep appearing wound, heavily crusted eschar. Psychiatric: He has a normal mood and affect. His behavior is normal.    Lab Results Lab Results  Component Value Date   WBC 18.4 (H) 02/02/2020   HGB 9.8 (L) 02/02/2020   HCT 30.2 (L) 02/02/2020   MCV 68.8 (L) 02/02/2020   PLT 303 02/02/2020    Lab Results  Component Value Date   CREATININE 1.68 (H) 02/02/2020   BUN 34 (H) 02/02/2020   NA 121 (L) 02/02/2020   K 4.0 02/02/2020   CL 90 (L) 02/02/2020   CO2 19 (L) 02/02/2020    Lab Results  Component Value Date   ALT 109 (H) 02/01/2020   AST 75 (H) 02/01/2020   ALKPHOS 317 (H) 02/01/2020   BILITOT 1.2 02/01/2020     Microbiology: Recent Results (from the past 240 hour(s))  Culture, blood (routine x 2)     Status: Abnormal (Preliminary result)   Collection Time: 02/01/20  9:20 AM   Specimen: BLOOD LEFT ARM  Result Value Ref Range Status   Specimen Description BLOOD LEFT ARM  Final   Special Requests   Final    BOTTLES DRAWN AEROBIC AND ANAEROBIC Blood Culture adequate volume   Culture  Setup Time   Final    IN BOTH AEROBIC AND ANAEROBIC BOTTLES GRAM POSITIVE COCCI IN CLUSTERS CRITICAL RESULT CALLED TO, READ BACK BY AND VERIFIED WITH: T DANG PHARMD 02/01/20 2244 JDW    Culture (A)  Final    STAPHYLOCOCCUS AUREUS CULTURE REINCUBATED FOR BETTER GROWTH Performed at [facility], [address], [place], [place] [address]    Report Status PENDING  Incomplete  Culture, blood (routine x 2)     Status: None (Preliminary result)   Collection Time: 02/01/20  9:30 AM   Specimen: BLOOD LEFT HAND  Result Value Ref Range Status   Specimen Description BLOOD LEFT HAND  Final   Special Requests   Final    BOTTLES DRAWN AEROBIC ONLY Blood Culture results may not be optimal due to an inadequate volume of  blood received in culture bottles   Culture  Setup Time   Final    AEROBIC BOTTLE ONLY GRAM POSITIVE COCCI IN CLUSTERS Organism ID to follow CRITICAL RESULT CALLED TO, READ BACK BY AND VERIFIED WITH: V BRYK PHARMD 02/02/20 4492 JDW    Culture   Final    NO GROWTH 1 DAY Performed at [facility], [place] [address] [place], [place] [address]    Report Status PENDING  Incomplete  Blood Culture ID Panel (Reflexed)     Status: Abnormal   Collection Time: 02/01/20  9:30 AM  Result Value Ref Range Status   Enterococcus species NOT DETECTED NOT DETECTED Final   Listeria monocytogenes NOT DETECTED NOT DETECTED Final   Staphylococcus species DETECTED (A) NOT DETECTED Final    Comment: CRITICAL RESULT CALLED TO, READ BACK BY AND VERIFIED WITH: V BRYK PHARMD 02/02/20 1219 JDW    Staphylococcus aureus (BCID) DETECTED (A) NOT DETECTED Final    Comment: Methicillin (oxacillin) susceptible  Staphylococcus aureus (MSSA). Preferred therapy is anti staphylococcal beta lactam antibiotic (Cefazolin or Nafcillin), unless clinically contraindicated. CRITICAL RESULT CALLED TO, READ BACK BY AND VERIFIED WITH: V BRYK PHARMD 02/02/20 0213 JDW    Methicillin resistance NOT DETECTED NOT DETECTED Final   Streptococcus species NOT DETECTED NOT DETECTED Final   Streptococcus agalactiae NOT DETECTED NOT DETECTED Final   Streptococcus pneumoniae NOT DETECTED NOT DETECTED Final   Streptococcus pyogenes NOT DETECTED NOT DETECTED Final   Acinetobacter baumannii NOT DETECTED NOT DETECTED Final   Enterobacteriaceae species NOT DETECTED NOT DETECTED Final   Enterobacter cloacae complex NOT DETECTED NOT DETECTED Final   Escherichia coli NOT DETECTED NOT DETECTED Final   Klebsiella oxytoca NOT DETECTED NOT DETECTED Final   Klebsiella pneumoniae NOT DETECTED NOT DETECTED Final   Proteus species NOT DETECTED NOT DETECTED Final   Serratia marcescens NOT DETECTED NOT DETECTED Final   Haemophilus influenzae NOT DETECTED NOT  DETECTED Final   Neisseria meningitidis NOT DETECTED NOT DETECTED Final   Pseudomonas aeruginosa NOT DETECTED NOT DETECTED Final   Candida albicans NOT DETECTED NOT DETECTED Final   Candida glabrata NOT DETECTED NOT DETECTED Final   Candida krusei NOT DETECTED NOT DETECTED Final   Candida parapsilosis NOT DETECTED NOT DETECTED Final   Candida tropicalis NOT DETECTED NOT DETECTED Final    Comment: Performed at [facility], [place] [address], [place], [place] [address]  SARS CORONAVIRUS 2 (TAT 6-24 HRS) Nasopharyngeal Nasopharyngeal Swab     Status: None   Collection Time: 02/01/20 10:26 AM   Specimen: Nasopharyngeal Swab  Result Value Ref Range Status   SARS Coronavirus 2 NEGATIVE NEGATIVE Final    Comment: (NOTE) SARS-CoV-2 target nucleic acids are NOT DETECTED. The SARS-CoV-2 RNA is generally detectable in upper and lower respiratory specimens during the acute phase of infection. Negative results do not preclude SARS-CoV-2 infection, do not rule out co-infections with other pathogens, and should not be used as the sole basis for treatment or other patient management decisions. Negative results must be combined with clinical observations, patient history, and epidemiological information. The expected result is Negative. Fact Sheet for Patients: SugarRoll.be Fact Sheet for Healthcare Providers: https://www.woods-mathews.com/ This test is not yet approved or cleared by the [place] FDA and  has been authorized for detection and/or diagnosis of SARS-CoV-2 by FDA under an Emergency Use Authorization (EUA). This EUA will remain  in effect (meaning this test can be used) for the duration of the COVID-19 declaration under Section 56 4(b)(1) of the Act, 21 U.S.C. section 360bbb-3(b)(1), unless the authorization is terminated or revoked sooner. Performed at [facility], [place] [address], [place], [place] [address]   Urine culture      Status: Abnormal (Preliminary result)   Collection Time: 02/01/20  3:51 PM   Specimen: In/Out Cath Urine  Result Value Ref Range Status   Specimen Description IN/OUT CATH URINE  Final   Special Requests NONE  Final   Culture (A)  Final    50,000 COLONIES/mL STAPHYLOCOCCUS AUREUS SUSCEPTIBILITIES TO FOLLOW Performed at [facility], [place] [address] [place], [place] [address]    Report Status PENDING  Incomplete  MRSA PCR Screening     Status: None   Collection Time: 02/02/20  9:37 AM   Specimen: Nasal Mucosa; Nasopharyngeal  Result Value Ref Range Status   MRSA by PCR NEGATIVE NEGATIVE Final    Comment:        The GeneXpert MRSA Assay (FDA approved for  NASAL specimens only), is one component of a comprehensive MRSA colonization surveillance program. It is not intended to diagnose MRSA infection nor to guide or monitor treatment for MRSA infections. Performed at [facility], [place] [address] [place], [place] [address]    [facility] [phone]  02/02/2020 12:18 PM

## 2020-02-02 NOTE — Progress Notes (Signed)
PHARMACY - PHYSICIAN COMMUNICATION CRITICAL VALUE ALERT - BLOOD CULTURE IDENTIFICATION (BCID)  Chris Bowman is an 59 y.o. male who presented to [facility] on 02/01/2020 with a chief complaint of weakness and falls.  Assessment:  Started on broad-spectrum ABX for sepsis, now w/ 3/3 blood cx bottles growing MSSA.  Name of physician (or Provider) Contacted: CBodenheimer  Current antibiotics: vancomycin, cefepime, metronidazole  Changes to prescribed antibiotics recommended:  Will change ABX to Ancef 2g IV Q8H.  Results for orders placed or performed during the hospital encounter of 02/01/20  Blood Culture ID Panel (Reflexed) (Collected: 02/01/2020  9:30 AM)  Result Value Ref Range   Enterococcus species NOT DETECTED NOT DETECTED   Listeria monocytogenes NOT DETECTED NOT DETECTED   Staphylococcus species DETECTED (A) NOT DETECTED   Staphylococcus aureus (BCID) DETECTED (A) NOT DETECTED   Methicillin resistance NOT DETECTED NOT DETECTED   Streptococcus species NOT DETECTED NOT DETECTED   Streptococcus agalactiae NOT DETECTED NOT DETECTED   Streptococcus pneumoniae NOT DETECTED NOT DETECTED   Streptococcus pyogenes NOT DETECTED NOT DETECTED   Acinetobacter baumannii NOT DETECTED NOT DETECTED   Enterobacteriaceae species NOT DETECTED NOT DETECTED   Enterobacter cloacae complex NOT DETECTED NOT DETECTED   Escherichia coli NOT DETECTED NOT DETECTED   Klebsiella oxytoca NOT DETECTED NOT DETECTED   Klebsiella pneumoniae NOT DETECTED NOT DETECTED   Proteus species NOT DETECTED NOT DETECTED   Serratia marcescens NOT DETECTED NOT DETECTED   Haemophilus influenzae NOT DETECTED NOT DETECTED   Neisseria meningitidis NOT DETECTED NOT DETECTED   Pseudomonas aeruginosa NOT DETECTED NOT DETECTED   Candida albicans NOT DETECTED NOT DETECTED   Candida glabrata NOT DETECTED NOT DETECTED   Candida krusei NOT DETECTED NOT DETECTED   Candida parapsilosis NOT DETECTED NOT DETECTED   Candida tropicalis NOT  DETECTED NOT DETECTED    Vernard Gambles, PharmD, BCPS  02/02/2020  2:21 AM

## 2020-02-02 NOTE — Evaluation (Signed)
Physical Therapy Evaluation Patient Details Name: Chris Bowman MRN: [id number] DOB: 02/10/1961 Today's Date: 02/02/2020   History of Present Illness  59 y.o. male with history of uncontrolled diabetes, essential hypertension, delusional disorders. bilateral feet wounds with early indication of osteomyelitis per MRI results from 3/4  Clinical Impression  Pt was seen for mobility of evaluation skills, and is not functionally ambulatory nor is he safe to ambulate without ortho shoes on B feet.  His R foot wound from just mainly standing was seeping blood onto his sock, informed nursing.  Follow him to move when safely able and will progress him as tolerated with plan to go to SNF for acute rehab to increase his safety at home.    Follow Up Recommendations SNF    Equipment Recommendations  None recommended by PT    Recommendations for Other Services       Precautions / Restrictions Precautions Precautions: Fall Precaution Comments: bilateral feet wounds  Restrictions Weight Bearing Restrictions: No      Mobility  Bed Mobility Overal bed mobility: Needs Assistance Bed Mobility: Supine to Sit;Sit to Supine     Supine to sit: Mod assist Sit to supine: Max assist   General bed mobility comments: mod A to advance BLE and fully advance hips to EOB position. Max A for all aspects to return to supine  Transfers Overall transfer level: Needs assistance Equipment used: Rolling walker (2 wheeled) Transfers: Sit to/from Stand Sit to Stand: Mod assist;+2 safety/equipment         General transfer comment: assist to powerup, steady and sequence movements with rw.   Ambulation/Gait Ambulation/Gait assistance: Min assist;+2 physical assistance;+2 safety/equipment Gait Distance (Feet): 3 Feet Assistive device: Rolling walker (2 wheeled);2 person hand held assist Gait Pattern/deviations: Step-to pattern;Decreased stance time - right;Decreased stride length;Decreased weight shift to  right;Wide base of support Gait velocity: reduced Gait velocity interpretation: <1.8 ft/sec, indicate of risk for recurrent falls General Gait Details: sidesteps with pain and difficulty side of bed with cues for sequence and safety  Stairs            Wheelchair Mobility    Modified Rankin (Stroke Patients Only)       Balance Overall balance assessment: Needs assistance;History of Falls Sitting-balance support: Bilateral upper extremity supported;Feet supported;Single extremity supported Sitting balance-Leahy Scale: Fair Sitting balance - Comments: sits mostly in BUE support position   Standing balance support: Bilateral upper extremity supported;During functional activity Standing balance-Leahy Scale: Poor Standing balance comment: rw and min A to steady                             Pertinent Vitals/Pain Pain Assessment: Faces Faces Pain Scale: Hurts even more Pain Location: neck and back, generalized Pain Descriptors / Indicators: Aching;Sore;Guarding;Grimacing Pain Intervention(s): Limited activity within patient's tolerance;Monitored during session;Repositioned;Patient requesting pain meds-RN notified    Home Living Family/patient expects to be discharged to:: Private residence Living Arrangements: Alone Available Help at Discharge: Family;Available PRN/intermittently Type of Home: Apartment Home Access: Stairs to enter Entrance Stairs-Rails: Right(in disrepair) Technical brewer of Steps: 5 Home Layout: One level   Additional Comments: sister present, lives locally    Prior Function Level of Independence: Independent with assistive device(s)   Gait / Transfers Assistance Needed: used RW prev, more dependent in last 2 weeks  ADL's / Homemaking Assistance Needed: has fallen three times in last couple weeks, unsafe        Hand Dominance  Extremity/Trunk Assessment   Upper Extremity Assessment Upper Extremity Assessment: Defer to  OT evaluation    Lower Extremity Assessment Lower Extremity Assessment: Generalized weakness    Cervical / Trunk Assessment Cervical / Trunk Assessment: Kyphotic  Communication   Communication: No difficulties  Cognition Arousal/Alertness: Awake/alert Behavior During Therapy: Flat affect;Anxious Overall Cognitive Status: Difficult to assess                                 General Comments: does better with one step commands. anxious. possible underlying psych diagnosis per chart review      General Comments General comments (skin integrity, edema, etc.): pt was up to side of bed and stood with dense cues for sequence and safety    Exercises     Assessment/Plan    PT Assessment Patient needs continued PT services  PT Problem List Decreased strength;Decreased range of motion;Decreased activity tolerance;Decreased balance;Decreased mobility;Decreased coordination;Decreased cognition;Decreased knowledge of use of DME;Decreased safety awareness;Decreased knowledge of precautions;Pain;Decreased skin integrity       PT Treatment Interventions DME instruction;Gait training;Functional mobility training;Therapeutic activities;Therapeutic exercise;Balance training;Neuromuscular re-education;Patient/family education;Stair training    PT Goals (Current goals can be found in the Care Plan section)  Acute Rehab PT Goals Patient Stated Goal: sister present during session PT Goal Formulation: With patient/family Time For Goal Achievement: 02/16/20 Potential to Achieve Goals: Good    Frequency Min 3X/week   Barriers to discharge Inaccessible home environment;Decreased caregiver support home alone    Co-evaluation PT/OT/SLP Co-Evaluation/Treatment: Yes Reason for Co-Treatment: For patient/therapist safety;To address functional/ADL transfers PT goals addressed during session: Proper use of DME;Balance;Mobility/safety with mobility OT goals addressed during session: ADL's  and self-care       AM-PAC PT "6 Clicks" Mobility  Outcome Measure Help needed turning from your back to your side while in a flat bed without using bedrails?: None Help needed moving from lying on your back to sitting on the side of a flat bed without using bedrails?: A Little Help needed moving to and from a bed to a chair (including a wheelchair)?: A Lot Help needed standing up from a chair using your arms (e.g., wheelchair or bedside chair)?: A Lot Help needed to walk in hospital room?: A Lot Help needed climbing 3-5 steps with a railing? : Total 6 Click Score: 14    End of Session Equipment Utilized During Treatment: Gait belt Activity Tolerance: Patient limited by fatigue;Treatment limited secondary to medical complications (Comment);Patient limited by pain Patient left: in bed;with call bell/phone within reach;with bed alarm set;with family/visitor present Nurse Communication: Mobility status;Patient requests pain meds;Other (comment)(informed nursing that R foot ulcers seeped on socks) PT Visit Diagnosis: Unsteadiness on feet (R26.81);Muscle weakness (generalized) (M62.81);Difficulty in walking, not elsewhere classified (R26.2);Adult, failure to thrive (R62.7);Repeated falls (R29.6);Pain Pain - Right/Left: Right Pain - part of body: Ankle and joints of foot    Time: 1010-1040 PT Time Calculation (min) (ACUTE ONLY): 30 min   Charges:   PT Evaluation $PT Eval Moderate Complexity: 1 Mod         Chris Bowman 02/02/2020, 1:06 PM  Samul Dada, PT MS Acute Rehab Dept. Number: [facility] [phone] and [facility] [phone]

## 2020-02-02 NOTE — Progress Notes (Signed)
New Admission Note:   Arrival Method: via stretcher from ED Mental Orientation: Alert and Oriented x3 Telemetry: Box 13, CCMD notified Assessment: Completed Skin: refer to flowsheet IV: R hand & LAC, NSL Pain: 0/10 Tubes: None Safety Measures: Safety Fall Prevention Plan has been discussed  Admission: to be completed 5 [place] Orientation: Patient has been orientated to the room, unit and staff.   Family: none  Orders to be reviewed and implemented. Will continue to monitor the patient. Call light has been placed within reach and bed alarm has been activated.

## 2020-02-02 NOTE — Consult Note (Signed)
WOC Nurse Consult Note: Patient receiving care in [facility] 5M10.  Consult completed remotely after review of record, including images of foot wounds. Reason for Consult: "Lower extremity wound" Wound type: bilateral feet wounds with early indication of osteomyelitis per MRI results from 3/4 Pressure Injury POA: Yes/No/NA Measurement: To be provided by the bedside RN in the flowsheet section Wound bed: Dry, callused Drainage (amount, consistency, odor)  Periwound: dry Dressing procedure/placement/frequency:  Dry gauze, wrap in kerlex daily.  I have sent a SecureChat message to Attending of record, Dr. Janeece Fitting asking if the plan is to consult Ortho or Podiatry.  WOC dressing instructions will not solve this patient's problems associated with his feet.  Awaiting a response. Thank you for the consult.  WOC nurse will not follow at this time.  Please re-consult the WOC team if needed.  Helmut Muster, RN, MSN, CWOCN, CNS-BC, pager [phone]

## 2020-02-02 NOTE — Progress Notes (Signed)
ABI has been completed.   Preliminary results in CV Proc.   Blanch Media 02/02/2020 9:11 AM

## 2020-02-02 NOTE — Progress Notes (Addendum)
Inpatient Diabetes Program Recommendations  AACE/ADA: New Consensus Statement on Inpatient Glycemic Control (2015)  Target Ranges:  Prepandial:   less than 140 mg/dL      Peak postprandial:   less than 180 mg/dL (1-2 hours)      Critically ill patients:  140 - 180 mg/dL   Lab Results  Component Value Date   GLUCAP 271 (H) 02/02/2020   HGBA1C 11.3 (H) 02/01/2020    Review of Glycemic Control Results for JAKEOB, TULLIS (MRN [id number]) as of 02/02/2020 10:19  Ref. Range 02/01/2020 08:42 02/01/2020 11:54 02/01/2020 20:48 02/01/2020 23:18 02/02/2020 06:50  Glucose-Capillary Latest Ref Range: 70 - 99 mg/dL 298 (H) 276 (H) 232 (H) 338 (H) 271 (H)   Diabetes history: DM2 Outpatient Diabetes medications: None Current orders for Inpatient glycemic control: Novolog moderate correction tid + hs 0-5 units  Inpatient Diabetes Program Recommendations:   -Levemir 6 units bid (0.15 units/kg x 83 kg) -Novolog 3 units tid meal coverage if eats 50%  Noted patient does not have insurance listed. Novolin 70/30 insulin mix from [facility] will be less expensive. Will consult transitions of care. Ordered Living Well With Diabetes and insulin pen starter kit. Will plan to speak with patient.  1:15 pm Attempted to speak with patient but patient states "I'm half out of it" Will attempt to speak with patient @ a more appropriate time.  Thank you, Nani Gasser. Shadrick Senne, RN, MSN, CDE  Diabetes Coordinator Inpatient Glycemic Control Team Team Pager [phone] (8am-5pm) 02/02/2020 10:26 AM

## 2020-02-02 NOTE — Consult Note (Signed)
ORTHOPAEDIC CONSULTATION  REQUESTING PHYSICIAN: Albertine Grates, MD  Chief ComPlaint: Abscess ulceration both feet.  HPI: Chris Bowman is a 59 y.o. male who presents with uncontrolled undiagnosed type 2 diabetes with severe protein caloric malnutrition with ulceration beneath metatarsal heads 1 4 and 5 on the right foot and a ulcer on the left great toe.  Past Medical History:  Diagnosis Date  . Delusional disorder (HCC)   . Essential hypertension   . Uncontrolled type 2 diabetes mellitus (HCC)    Past Surgical History:  Procedure Laterality Date  . TONSILLECTOMY     Social History   Socioeconomic History  . Marital status: Divorced    Spouse name: Not on file  . Number of children: Not on file  . Years of education: Not on file  . Highest education level: Not on file  Occupational History  . Not on file  Tobacco Use  . Smoking status: Never Smoker  . Smokeless tobacco: Never Used  Substance and Sexual Activity  . Alcohol use: Never  . Drug use: Never  . Sexual activity: Not on file  Other Topics Concern  . Not on file  Social History Narrative  . Not on file   Social Determinants of Health   Financial Resource Strain:   . Difficulty of Paying Living Expenses: Not on file  Food Insecurity:   . Worried About Programme researcher, broadcasting/film/video in the Last Year: Not on file  . Ran Out of Food in the Last Year: Not on file  Transportation Needs:   . Lack of Transportation (Medical): Not on file  . Lack of Transportation (Non-Medical): Not on file  Physical Activity:   . Days of Exercise per Week: Not on file  . Minutes of Exercise per Session: Not on file  Stress:   . Feeling of Stress : Not on file  Social Connections:   . Frequency of Communication with Friends and Family: Not on file  . Frequency of Social Gatherings with Friends and Family: Not on file  . Attends Religious Services: Not on file  . Active Member of Clubs or Organizations: Not on file  . Attends Tax inspector Meetings: Not on file  . Marital Status: Not on file   Family History  Problem Relation Age of Onset  . Diabetes Mother    - negative except otherwise stated in the family history section Allergies  Allergen Reactions  . Codeine     Kept sedated    Prior to Admission medications   Medication Sig Start Date End Date Taking? Authorizing Provider  acetaminophen (TYLENOL) 325 MG tablet Take 650 mg by mouth every 6 (six) hours as needed for mild pain.   Yes [provider]   DG Shoulder Right  Result Date: 02/01/2020 CLINICAL DATA:  58 year old male with fall and right shoulder pain. EXAM: RIGHT SHOULDER - 2+ VIEW COMPARISON:  Chest radiograph dated 02/01/2020. FINDINGS: There is no acute fracture or dislocation. No significant arthritic changes. Mild elevation of the right humeral head may represent chronic rotator cuff injury. There is degenerative changes of the right AC joint. The soft tissues are unremarkable. IMPRESSION: No acute fracture or dislocation. Electronically Signed   By: Elgie Collard M.D.   On: 02/01/2020 19:17   MR BRAIN WO CONTRAST  Result Date: 02/01/2020 CLINICAL DATA:  Generalized weakness, gait instability, and multiple falls. EXAM: MRI HEAD WITHOUT CONTRAST MRI CERVICAL SPINE WITHOUT CONTRAST TECHNIQUE: Multiplanar, multiecho pulse sequences of the brain  and surrounding structures, and cervical spine, to include the craniocervical junction and cervicothoracic junction, were obtained without intravenous contrast. COMPARISON:  None. FINDINGS: MRI HEAD FINDINGS The study is mildly motion degraded. Brain: There is no evidence of acute infarct, intracranial hemorrhage, mass, midline shift, or extra-axial fluid collection. There is mild cerebral atrophy. Periventricular white matter T2 hyperintensities are nonspecific but compatible with minimal chronic small vessel ischemic disease. Vascular: Major intracranial vascular flow voids are preserved. Skull and  upper cervical spine: No suspicious marrow lesion. Sinuses/Orbits: Unremarkable orbits. Minimal left ethmoid air cell mucosal thickening. At most trace right mastoid fluid. Other: None. MRI CERVICAL SPINE FINDINGS The study is motion degraded throughout including severe motion on the axial sequences. Alignment: No significant listhesis. Vertebrae: No fracture or suspicious marrow lesion. Chronic degenerative endplate changes at C6-7 associated with severe disc space narrowing. Moderate disc space narrowing at C4-5. No discal fluid signal or frank vertebral marrow edema to indicate discitis or osteomyelitis. Small right facet joint effusion at C5-6 without substantial facet marrow edema. Cord: No cord signal abnormality identified within limitations of motion artifact. Posterior Fossa, vertebral arteries, paraspinal tissues: There is prevertebral edema, and there are apparent fluid collections along the right anterolateral aspects of the C3-C5 vertebral bodies measuring up to approximately 4 cm in craniocaudal length and with a maximal transverse diameter of approximately 1.3 cm. There is a diffusely edematous appearance of the right scalene musculature more inferiorly in the neck. The vertebral artery flow voids are grossly preserved bilaterally. Disc levels: A small ventral epidural fluid collection is suspected asymmetrically to the right of midline extending from the C3 inferior to C5 superior endplate levels (for example series 5, image 7 and series 10, image 22). This measures 2-2.5 cm in craniocaudal length and less than 1 cm in transverse dimension and contributes to mild spinal stenosis at these levels without frank cord compression. Detailed assessment of degenerative changes is limited by motion, however there is no significant spinal stenosis elsewhere. Disc bulging and uncovertebral spurring result in neural foraminal stenosis which is likely moderate bilaterally at C3-4, severe on the right and moderate  to severe on the left at C4-5, mild on the right at C5-6, and moderate on the right at C6-7. IMPRESSION: MRI HEAD: 1. No acute intracranial abnormality. 2. Mild cerebral atrophy and chronic small vessel ischemia. MRI CERVICAL SPINE: 1. Severely motion degraded examination. 2. Prevertebral edema with right-sided paravertebral fluid collections in the mid to lower cervical spine concerning for infection and abscesses with suspected myositis involving the right-sided scalene musculature. 3. Suspected small ventral epidural abscess or phlegmon from C3-C5 with mild spinal stenosis. No cord compression. 4. No definite evidence of discitis or osteomyelitis. 5. Small volume facet joint fluid on the right at C5-6 is favored to be degenerative although septic arthritis is not excluded. These results will be called to the ordering clinician or representative by the Radiologist Assistant, and communication documented in the PACS or zVision Dashboard. Electronically Signed   By: Allen  Grady M.D.   On: 02/01/2020 19:38   MR CERVICAL SPINE WO CONTRAST  Result Date: 02/01/2020 CLINICAL DATA:  Generalized weakness, gait instability, and multiple falls. EXAM: MRI HEAD WITHOUT CONTRAST MRI CERVICAL SPINE WITHOUT CONTRAST TECHNIQUE: Multiplanar, multiecho pulse sequences of the brain and surrounding structures, and cervical spine, to include the craniocervical junction and cervicothoracic junction, were obtained without intravenous contrast. COMPARISON:  None. FINDINGS: MRI HEAD FINDINGS The study is mildly motion degraded. Brain: There is no evidence of   acute infarct, intracranial hemorrhage, mass, midline shift, or extra-axial fluid collection. There is mild cerebral atrophy. Periventricular white matter T2 hyperintensities are nonspecific but compatible with minimal chronic small vessel ischemic disease. Vascular: Major intracranial vascular flow voids are preserved. Skull and upper cervical spine: No suspicious marrow lesion.  Sinuses/Orbits: Unremarkable orbits. Minimal left ethmoid air cell mucosal thickening. At most trace right mastoid fluid. Other: None. MRI CERVICAL SPINE FINDINGS The study is motion degraded throughout including severe motion on the axial sequences. Alignment: No significant listhesis. Vertebrae: No fracture or suspicious marrow lesion. Chronic degenerative endplate changes at A2-1 associated with severe disc space narrowing. Moderate disc space narrowing at C4-5. No discal fluid signal or frank vertebral marrow edema to indicate discitis or osteomyelitis. Small right facet joint effusion at C5-6 without substantial facet marrow edema. Cord: No cord signal abnormality identified within limitations of motion artifact. Posterior Fossa, vertebral arteries, paraspinal tissues: There is prevertebral edema, and there are apparent fluid collections along the right anterolateral aspects of the C3-C5 vertebral bodies measuring up to approximately 4 cm in craniocaudal length and with a maximal transverse diameter of approximately 1.3 cm. There is a diffusely edematous appearance of the right scalene musculature more inferiorly in the neck. The vertebral artery flow voids are grossly preserved bilaterally. Disc levels: A small ventral epidural fluid collection is suspected asymmetrically to the right of midline extending from the C3 inferior to C5 superior endplate levels (for example series 5, image 7 and series 10, image 22). This measures 2-2.5 cm in craniocaudal length and less than 1 cm in transverse dimension and contributes to mild spinal stenosis at these levels without frank cord compression. Detailed assessment of degenerative changes is limited by motion, however there is no significant spinal stenosis elsewhere. Disc bulging and uncovertebral spurring result in neural foraminal stenosis which is likely moderate bilaterally at C3-4, severe on the right and moderate to severe on the left at C4-5, mild on the right  at C5-6, and moderate on the right at C6-7. IMPRESSION: MRI HEAD: 1. No acute intracranial abnormality. 2. Mild cerebral atrophy and chronic small vessel ischemia. MRI CERVICAL SPINE: 1. Severely motion degraded examination. 2. Prevertebral edema with right-sided paravertebral fluid collections in the mid to lower cervical spine concerning for infection and abscesses with suspected myositis involving the right-sided scalene musculature. 3. Suspected small ventral epidural abscess or phlegmon from C3-C5 with mild spinal stenosis. No cord compression. 4. No definite evidence of discitis or osteomyelitis. 5. Small volume facet joint fluid on the right at C5-6 is favored to be degenerative although septic arthritis is not excluded. These results will be called to the ordering clinician or representative by the Radiologist Assistant, and communication documented in the PACS or zVision Dashboard. Electronically Signed   By: Logan Bores M.D.   On: 02/01/2020 19:38   US Abdomen Complete  Result Date: 02/02/2020 CLINICAL DATA:  Elevated liver function tests. Renal insufficiency with elevated creatinine. EXAM: ABDOMEN ULTRASOUND COMPLETE COMPARISON:  None. FINDINGS: Gallbladder: Several gallstones are seen, largest measuring 1.2 cm. Gallbladder is incompletely distended. No abnormal gallbladder wall thickening or pericholecystic fluid noted. No sonographic Murphy sign noted by sonographer. Common bile duct: Diameter: 3 mm, within normal limits. Liver: No focal lesion identified. Within normal limits in parenchymal echogenicity. Portal vein is patent on color Doppler imaging with normal direction of blood flow towards the liver. IVC: No abnormality visualized. Pancreas: Pancreas is not visualized due to overlying bowel gas. Spleen: Size and appearance within normal  limits. Right Kidney: Length: 11.3 cm. Mildly increased parenchymal echogenicity noted. No mass or hydronephrosis visualized. Left Kidney: Length: 12.3 cm.  Mildly increased parenchymal echogenicity noted. No mass or hydronephrosis visualized. Abdominal aorta: No aneurysm visualized, although distal abdominal aorta obscured by overlying bowel gas. Other findings: None. IMPRESSION: Cholelithiasis. No sonographic evidence of cholecystitis or biliary ductal dilatation. Mildly increased renal parenchymal echogenicity, consistent with medical renal disease. No evidence of hydronephrosis. Electronically Signed   By: Danae Orleans M.D.   On: 02/02/2020 16:48   MR FOOT RIGHT WO CONTRAST  Result Date: 02/01/2020 CLINICAL DATA:  Osteomyelitis EXAM: MRI OF THE RIGHT FOREFOOT WITHOUT CONTRAST TECHNIQUE: Multiplanar, multisequence MR imaging of the right was performed. No intravenous contrast was administered. COMPARISON:  Radiograph November 29, 2007 FINDINGS: Bones/Joint/Cartilage There is increased marrow signal seen through the fifth metatarsal head, proximal fifth phalanx, fourth metatarsal head, and proximal fourth phalanx with associated T1 hypointensity. This could be due to early osteomyelitis. Increased marrow signal seen within the distal fourth and fifth phalanges, mid fifth metatarsal shaft, second and third proximal phalanges, likely due to reactive marrow. There is large cystic type lucency seen within the navicular as on prior exam. 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 There is also osseous coalition between the second metatarsal and intermediate cuneiform as well as the medial and lateral cuneiform is. 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 Ligaments The Lisfranc ligaments and collateral ligaments are intact. Muscles and Tendons Diffusely increased signal seen throughout the muscles, likely due to microvascular disease and denervation atrophy. The flexor and extensor tendons  appear to be intact. The plantar fascia is intact. Soft tissues There is focal area of superficial ulceration seen on the plantar surface of the midfoot between the fourth and fifth metatarsal heads measuring 1.7 cm in transverse dimension, series 5, image 28. No definite loculated fluid collection or sinus tract is seen. There is mild dorsal soft tissue swelling. IMPRESSION: 1. Focal area of superficial ulceration on the plantar surface beneath the fourth and fifth metatarsal heads. For no sinus tract or abscess. Findings which could be suggestive of osteomyelitis involving the fourth and fifth metatarsal heads and proximal phalanges. 2. Probable reactive marrow within the distal fourth and fifth phalanges, mid fifth metatarsal shaft, and second and third proximal phalanges. 3. Osseous coalition as described above. Electronically Signed   By: Jonna Clark M.D.   On: 02/01/2020 22:12   MR FOOT LEFT WO CONTRAST  Result Date: 02/01/2020 CLINICAL DATA:  Osteomyelitis of the foot EXAM: MRI OF THE LEFT FOOT WITHOUT CONTRAST TECHNIQUE: Multiplanar, multisequence MR imaging of the left was performed. No intravenous contrast was administered. COMPARISON:  None. FINDINGS: Bones/Joint/Cartilage There is question of a tiny focal area of early erosive type change seen on the plantar surface of the distal phalanx series 4, image 44. Increased marrow signal seen throughout the distal phalanx of the first digit and the proximal first phalanx at the interphalangeal joint. No definite area of cortical destruction is seen. There is interphalangeal joint and MTP joint osteoarthritis at the first digit with joint space loss and subchondral cystic changes. There is osseous coalition seen at the second metatarsal cuneiform joint and as well with the medial and intermediate cuneiform. No large joint effusion is noted. There is cystic changes seen at the talonavicular joint with joint space  loss as well as at the naviculocuneiform  joint. Ligaments The Lisfranc ligaments and collateral ligaments appear to be intact. Muscles and Tendons There is diffusely increased signal seen throughout the muscles, likely due to microvascular disease and denervation atrophy. The flexor and extensor tendons are intact. The plantar fascia is intact. Soft tissues There is focal area of skin irregularity/superficial ulceration seen on the plantar surface of the distal first digit with skin thickening. There is diffuse subcutaneous edema. No loculated fluid collection or sinus tract however is seen. There is mild dorsal soft tissue swelling. IMPRESSION: Subtle skin ulceration with probable findings of cellulitis involving the distal digit of the first toe. No sinus tract or abscess is seen. There is question of early osteomyelitis involving the plantar surface of the distal first phalanx with a focal area of erosive type change. Probable reactive marrow seen throughout the distal and proximal first phalanx. 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 Osteoarthritis as described above. Electronically Signed   By: Jonna Clark M.D.   On: 02/01/2020 22:06   DG Chest Portable 1 View  Result Date: 02/01/2020 CLINICAL DATA:  Generalized weakness, shortness of breath EXAM: PORTABLE CHEST 1 VIEW COMPARISON:  None FINDINGS: Heart is normal size. Lingular scarring. Right lung clear. No effusions or acute bony abnormality. IMPRESSION: No active disease. Electronically Signed   By: Charlett Nose M.D.   On: 02/01/2020 09:49   VAS [place] ABI WITH/WO TBI  Result Date: 02/02/2020 LOWER EXTREMITY DOPPLER STUDY Indications: Ulceration, and bilateral foot ulcers. High Risk Factors: Hypertension, Diabetes.  Comparison Study: no prior Performing Technologist: Blanch Media RVS  Examination Guidelines: A complete evaluation includes at minimum, Doppler waveform signals and systolic blood pressure reading at the level  of bilateral brachial, anterior tibial, and posterior tibial arteries, when vessel segments are accessible. Bilateral testing is considered an integral part of a complete examination. Photoelectric Plethysmograph (PPG) waveforms and toe systolic pressure readings are included as required and additional duplex testing as needed. Limited examinations for reoccurring indications may be performed as noted.  ABI Findings: +---------+------------------+-----+---------+--------+ Right    Rt Pressure (mmHg)IndexWaveform Comment  +---------+------------------+-----+---------+--------+ Brachial 1434                   triphasic         +---------+------------------+-----+---------+--------+ PTA      117               0.82 triphasic         +---------+------------------+-----+---------+--------+ DP       144               1.01 triphasic         +---------+------------------+-----+---------+--------+ Great Toe68                0.05                   +---------+------------------+-----+---------+--------+ +---------+------------------+-----+---------+-------+ Left     Lt Pressure (mmHg)IndexWaveform Comment +---------+------------------+-----+---------+-------+ Brachial 134                    triphasic        +---------+------------------+-----+---------+-------+ PTA      123               0.86 triphasic        +---------+------------------+-----+---------+-------+ DP       127  0.89 triphasic        +---------+------------------+-----+---------+-------+ Great Toe68                0.05                  +---------+------------------+-----+---------+-------+ +-------+-----------+-----------+------------+------------+ ABI/TBIToday's ABIToday's TBIPrevious ABIPrevious TBI +-------+-----------+-----------+------------+------------+ Right  1.10                                           +-------+-----------+-----------+------------+------------+ Left    0.89                                           +-------+-----------+-----------+------------+------------+  Summary: Right: Resting right ankle-brachial index is within normal range. No evidence of significant right lower extremity arterial disease. The right toe-brachial index is abnormal. Left: Resting left ankle-brachial index indicates mild left lower extremity arterial disease. The left toe-brachial index is abnormal.  *See table(s) above for measurements and observations.  Electronically signed by Sherald Hesshristopher Clark MD on 02/02/2020 at 4:35:41 PM.    Final    - pertinent xrays, CT, MRI studies were reviewed and independently interpreted  Positive ROS: All other systems have been reviewed and were otherwise negative with the exception of those mentioned in the HPI and as above.  Physical Exam: General: Alert, no acute distress Psychiatric: Patient is competent for consent with normal mood and affect Lymphatic: No axillary or cervical lymphadenopathy Cardiovascular: No pedal edema Respiratory: No cyanosis, no use of accessory musculature GI: No organomegaly, abdomen is soft and non-tender    Images:  @ENCIMAGES @  Labs:  Lab Results  Component Value Date   HGBA1C 11.3 (H) 02/01/2020   ESRSEDRATE 84 (H) 02/01/2020   CRP 14.3 (H) 02/01/2020   REPTSTATUS PENDING 02/01/2020   CULT (A) 02/01/2020    50,000 COLONIES/mL STAPHYLOCOCCUS AUREUS SUSCEPTIBILITIES TO FOLLOW Performed at [facility], [address] [place], [place] [address]     Lab Results  Component Value Date   ALBUMIN 2.3 (L) 02/01/2020   PREALBUMIN 5.3 (L) 02/01/2020    Neurologic: Patient does not have protective sensation bilateral lower extremities.   MUSCULOSKELETAL:   Skin: Examination of the left foot patient has a ulcer over the plantar medial aspect of the left great toe.  There is a small amount of purulent drainage patient has a strong dorsalis pedis pulse ankle-brachial indices shows good  circulation.  Examination of the right foot patient has black gangrenous ulcers beneath the first fourth and fifth metatarsal heads on the right foot he has a strong dorsalis pedis pulse on the right foot ankle-brachial indices shows good circulation.  Patient's hemoglobin A1c is 11.3 albumin 2.3.   Review of the MRI scan shows osteomyelitis involving the fourth and fifth metatarsal heads with possible osteomyelitis of the left great toe.  Assessment: Assessment: Uncontrolled undiagnosed type 2 diabetes with severe protein caloric malnutrition with osteomyelitis of the fourth and fifth metatarsal heads of the right foot and a superficial abscess beneath the great toe of the left foot.  Plan: Plan: We will plan for a transmetatarsal amputation on the right foot debridement of the ulcer on the left great toe.  Risks and benefits were discussed including risk of the wound not healing need for additional surgery.  Patient states he  understands wishes to proceed at this time.  Plan for surgery Saturday morning at 10 AM.  Thank you for the consult and the opportunity to see Mr. Takai Chiaramonte, MD [facility] [phone] 5:00 PM

## 2020-02-02 NOTE — H&P (View-Only) (Signed)
ORTHOPAEDIC CONSULTATION  REQUESTING PHYSICIAN: Albertine Grates, MD  Chief ComPlaint: Abscess ulceration both feet.  HPI: Chris Bowman is a 59 y.o. male who presents with uncontrolled undiagnosed type 2 diabetes with severe protein caloric malnutrition with ulceration beneath metatarsal heads 1 4 and 5 on the right foot and a ulcer on the left great toe.  Past Medical History:  Diagnosis Date  . Delusional disorder (HCC)   . Essential hypertension   . Uncontrolled type 2 diabetes mellitus (HCC)    Past Surgical History:  Procedure Laterality Date  . TONSILLECTOMY     Social History   Socioeconomic History  . Marital status: Divorced    Spouse name: Not on file  . Number of children: Not on file  . Years of education: Not on file  . Highest education level: Not on file  Occupational History  . Not on file  Tobacco Use  . Smoking status: Never Smoker  . Smokeless tobacco: Never Used  Substance and Sexual Activity  . Alcohol use: Never  . Drug use: Never  . Sexual activity: Not on file  Other Topics Concern  . Not on file  Social History Narrative  . Not on file   Social Determinants of Health   Financial Resource Strain:   . Difficulty of Paying Living Expenses: Not on file  Food Insecurity:   . Worried About Programme researcher, broadcasting/film/video in the Last Year: Not on file  . Ran Out of Food in the Last Year: Not on file  Transportation Needs:   . Lack of Transportation (Medical): Not on file  . Lack of Transportation (Non-Medical): Not on file  Physical Activity:   . Days of Exercise per Week: Not on file  . Minutes of Exercise per Session: Not on file  Stress:   . Feeling of Stress : Not on file  Social Connections:   . Frequency of Communication with Friends and Family: Not on file  . Frequency of Social Gatherings with Friends and Family: Not on file  . Attends Religious Services: Not on file  . Active Member of Clubs or Organizations: Not on file  . Attends Tax inspector Meetings: Not on file  . Marital Status: Not on file   Family History  Problem Relation Age of Onset  . Diabetes Mother    - negative except otherwise stated in the family history section Allergies  Allergen Reactions  . Codeine     Kept sedated    Prior to Admission medications   Medication Sig Start Date End Date Taking? Authorizing Provider  acetaminophen (TYLENOL) 325 MG tablet Take 650 mg by mouth every 6 (six) hours as needed for mild pain.   Yes [provider]   DG Shoulder Right  Result Date: 02/01/2020 CLINICAL DATA:  58 year old male with fall and right shoulder pain. EXAM: RIGHT SHOULDER - 2+ VIEW COMPARISON:  Chest radiograph dated 02/01/2020. FINDINGS: There is no acute fracture or dislocation. No significant arthritic changes. Mild elevation of the right humeral head may represent chronic rotator cuff injury. There is degenerative changes of the right AC joint. The soft tissues are unremarkable. IMPRESSION: No acute fracture or dislocation. Electronically Signed   By: Elgie Collard M.D.   On: 02/01/2020 19:17   MR BRAIN WO CONTRAST  Result Date: 02/01/2020 CLINICAL DATA:  Generalized weakness, gait instability, and multiple falls. EXAM: MRI HEAD WITHOUT CONTRAST MRI CERVICAL SPINE WITHOUT CONTRAST TECHNIQUE: Multiplanar, multiecho pulse sequences of the brain  and surrounding structures, and cervical spine, to include the craniocervical junction and cervicothoracic junction, were obtained without intravenous contrast. COMPARISON:  None. FINDINGS: MRI HEAD FINDINGS The study is mildly motion degraded. Brain: There is no evidence of acute infarct, intracranial hemorrhage, mass, midline shift, or extra-axial fluid collection. There is mild cerebral atrophy. Periventricular white matter T2 hyperintensities are nonspecific but compatible with minimal chronic small vessel ischemic disease. Vascular: Major intracranial vascular flow voids are preserved. Skull and  upper cervical spine: No suspicious marrow lesion. Sinuses/Orbits: Unremarkable orbits. Minimal left ethmoid air cell mucosal thickening. At most trace right mastoid fluid. Other: None. MRI CERVICAL SPINE FINDINGS The study is motion degraded throughout including severe motion on the axial sequences. Alignment: No significant listhesis. Vertebrae: No fracture or suspicious marrow lesion. Chronic degenerative endplate changes at C6-7 associated with severe disc space narrowing. Moderate disc space narrowing at C4-5. No discal fluid signal or frank vertebral marrow edema to indicate discitis or osteomyelitis. Small right facet joint effusion at C5-6 without substantial facet marrow edema. Cord: No cord signal abnormality identified within limitations of motion artifact. Posterior Fossa, vertebral arteries, paraspinal tissues: There is prevertebral edema, and there are apparent fluid collections along the right anterolateral aspects of the C3-C5 vertebral bodies measuring up to approximately 4 cm in craniocaudal length and with a maximal transverse diameter of approximately 1.3 cm. There is a diffusely edematous appearance of the right scalene musculature more inferiorly in the neck. The vertebral artery flow voids are grossly preserved bilaterally. Disc levels: A small ventral epidural fluid collection is suspected asymmetrically to the right of midline extending from the C3 inferior to C5 superior endplate levels (for example series 5, image 7 and series 10, image 22). This measures 2-2.5 cm in craniocaudal length and less than 1 cm in transverse dimension and contributes to mild spinal stenosis at these levels without frank cord compression. Detailed assessment of degenerative changes is limited by motion, however there is no significant spinal stenosis elsewhere. Disc bulging and uncovertebral spurring result in neural foraminal stenosis which is likely moderate bilaterally at C3-4, severe on the right and moderate  to severe on the left at C4-5, mild on the right at C5-6, and moderate on the right at C6-7. IMPRESSION: MRI HEAD: 1. No acute intracranial abnormality. 2. Mild cerebral atrophy and chronic small vessel ischemia. MRI CERVICAL SPINE: 1. Severely motion degraded examination. 2. Prevertebral edema with right-sided paravertebral fluid collections in the mid to lower cervical spine concerning for infection and abscesses with suspected myositis involving the right-sided scalene musculature. 3. Suspected small ventral epidural abscess or phlegmon from C3-C5 with mild spinal stenosis. No cord compression. 4. No definite evidence of discitis or osteomyelitis. 5. Small volume facet joint fluid on the right at C5-6 is favored to be degenerative although septic arthritis is not excluded. These results will be called to the ordering clinician or representative by the Radiologist Assistant, and communication documented in the PACS or zVision Dashboard. Electronically Signed   By: Sebastian AcheAllen  Grady M.D.   On: 02/01/2020 19:38   MR CERVICAL SPINE WO CONTRAST  Result Date: 02/01/2020 CLINICAL DATA:  Generalized weakness, gait instability, and multiple falls. EXAM: MRI HEAD WITHOUT CONTRAST MRI CERVICAL SPINE WITHOUT CONTRAST TECHNIQUE: Multiplanar, multiecho pulse sequences of the brain and surrounding structures, and cervical spine, to include the craniocervical junction and cervicothoracic junction, were obtained without intravenous contrast. COMPARISON:  None. FINDINGS: MRI HEAD FINDINGS The study is mildly motion degraded. Brain: There is no evidence of  acute infarct, intracranial hemorrhage, mass, midline shift, or extra-axial fluid collection. There is mild cerebral atrophy. Periventricular white matter T2 hyperintensities are nonspecific but compatible with minimal chronic small vessel ischemic disease. Vascular: Major intracranial vascular flow voids are preserved. Skull and upper cervical spine: No suspicious marrow lesion.  Sinuses/Orbits: Unremarkable orbits. Minimal left ethmoid air cell mucosal thickening. At most trace right mastoid fluid. Other: None. MRI CERVICAL SPINE FINDINGS The study is motion degraded throughout including severe motion on the axial sequences. Alignment: No significant listhesis. Vertebrae: No fracture or suspicious marrow lesion. Chronic degenerative endplate changes at A2-1 associated with severe disc space narrowing. Moderate disc space narrowing at C4-5. No discal fluid signal or frank vertebral marrow edema to indicate discitis or osteomyelitis. Small right facet joint effusion at C5-6 without substantial facet marrow edema. Cord: No cord signal abnormality identified within limitations of motion artifact. Posterior Fossa, vertebral arteries, paraspinal tissues: There is prevertebral edema, and there are apparent fluid collections along the right anterolateral aspects of the C3-C5 vertebral bodies measuring up to approximately 4 cm in craniocaudal length and with a maximal transverse diameter of approximately 1.3 cm. There is a diffusely edematous appearance of the right scalene musculature more inferiorly in the neck. The vertebral artery flow voids are grossly preserved bilaterally. Disc levels: A small ventral epidural fluid collection is suspected asymmetrically to the right of midline extending from the C3 inferior to C5 superior endplate levels (for example series 5, image 7 and series 10, image 22). This measures 2-2.5 cm in craniocaudal length and less than 1 cm in transverse dimension and contributes to mild spinal stenosis at these levels without frank cord compression. Detailed assessment of degenerative changes is limited by motion, however there is no significant spinal stenosis elsewhere. Disc bulging and uncovertebral spurring result in neural foraminal stenosis which is likely moderate bilaterally at C3-4, severe on the right and moderate to severe on the left at C4-5, mild on the right  at C5-6, and moderate on the right at C6-7. IMPRESSION: MRI HEAD: 1. No acute intracranial abnormality. 2. Mild cerebral atrophy and chronic small vessel ischemia. MRI CERVICAL SPINE: 1. Severely motion degraded examination. 2. Prevertebral edema with right-sided paravertebral fluid collections in the mid to lower cervical spine concerning for infection and abscesses with suspected myositis involving the right-sided scalene musculature. 3. Suspected small ventral epidural abscess or phlegmon from C3-C5 with mild spinal stenosis. No cord compression. 4. No definite evidence of discitis or osteomyelitis. 5. Small volume facet joint fluid on the right at C5-6 is favored to be degenerative although septic arthritis is not excluded. These results will be called to the ordering clinician or representative by the Radiologist Assistant, and communication documented in the PACS or zVision Dashboard. Electronically Signed   By: Logan Bores M.D.   On: 02/01/2020 19:38   US Abdomen Complete  Result Date: 02/02/2020 CLINICAL DATA:  Elevated liver function tests. Renal insufficiency with elevated creatinine. EXAM: ABDOMEN ULTRASOUND COMPLETE COMPARISON:  None. FINDINGS: Gallbladder: Several gallstones are seen, largest measuring 1.2 cm. Gallbladder is incompletely distended. No abnormal gallbladder wall thickening or pericholecystic fluid noted. No sonographic Murphy sign noted by sonographer. Common bile duct: Diameter: 3 mm, within normal limits. Liver: No focal lesion identified. Within normal limits in parenchymal echogenicity. Portal vein is patent on color Doppler imaging with normal direction of blood flow towards the liver. IVC: No abnormality visualized. Pancreas: Pancreas is not visualized due to overlying bowel gas. Spleen: Size and appearance within normal  limits. Right Kidney: Length: 11.3 cm. Mildly increased parenchymal echogenicity noted. No mass or hydronephrosis visualized. Left Kidney: Length: 12.3 cm.  Mildly increased parenchymal echogenicity noted. No mass or hydronephrosis visualized. Abdominal aorta: No aneurysm visualized, although distal abdominal aorta obscured by overlying bowel gas. Other findings: None. IMPRESSION: Cholelithiasis. No sonographic evidence of cholecystitis or biliary ductal dilatation. Mildly increased renal parenchymal echogenicity, consistent with medical renal disease. No evidence of hydronephrosis. Electronically Signed   By: Danae Orleans M.D.   On: 02/02/2020 16:48   MR FOOT RIGHT WO CONTRAST  Result Date: 02/01/2020 CLINICAL DATA:  Osteomyelitis EXAM: MRI OF THE RIGHT FOREFOOT WITHOUT CONTRAST TECHNIQUE: Multiplanar, multisequence MR imaging of the right was performed. No intravenous contrast was administered. COMPARISON:  Radiograph November 29, 2007 FINDINGS: Bones/Joint/Cartilage There is increased marrow signal seen through the fifth metatarsal head, proximal fifth phalanx, fourth metatarsal head, and proximal fourth phalanx with associated T1 hypointensity. This could be due to early osteomyelitis. Increased marrow signal seen within the distal fourth and fifth phalanges, mid fifth metatarsal shaft, second and third proximal phalanges, likely due to reactive marrow. There is large cystic type lucency seen within the navicular as on prior exam. 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 There is also osseous coalition between the second metatarsal and intermediate cuneiform as well as the medial and lateral cuneiform is. 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 Ligaments The Lisfranc ligaments and collateral ligaments are intact. Muscles and Tendons Diffusely increased signal seen throughout the muscles, likely due to microvascular disease and denervation atrophy. The flexor and extensor tendons  appear to be intact. The plantar fascia is intact. Soft tissues There is focal area of superficial ulceration seen on the plantar surface of the midfoot between the fourth and fifth metatarsal heads measuring 1.7 cm in transverse dimension, series 5, image 28. No definite loculated fluid collection or sinus tract is seen. There is mild dorsal soft tissue swelling. IMPRESSION: 1. Focal area of superficial ulceration on the plantar surface beneath the fourth and fifth metatarsal heads. For no sinus tract or abscess. Findings which could be suggestive of osteomyelitis involving the fourth and fifth metatarsal heads and proximal phalanges. 2. Probable reactive marrow within the distal fourth and fifth phalanges, mid fifth metatarsal shaft, and second and third proximal phalanges. 3. Osseous coalition as described above. Electronically Signed   By: Jonna Clark M.D.   On: 02/01/2020 22:12   MR FOOT LEFT WO CONTRAST  Result Date: 02/01/2020 CLINICAL DATA:  Osteomyelitis of the foot EXAM: MRI OF THE LEFT FOOT WITHOUT CONTRAST TECHNIQUE: Multiplanar, multisequence MR imaging of the left was performed. No intravenous contrast was administered. COMPARISON:  None. FINDINGS: Bones/Joint/Cartilage There is question of a tiny focal area of early erosive type change seen on the plantar surface of the distal phalanx series 4, image 44. Increased marrow signal seen throughout the distal phalanx of the first digit and the proximal first phalanx at the interphalangeal joint. No definite area of cortical destruction is seen. There is interphalangeal joint and MTP joint osteoarthritis at the first digit with joint space loss and subchondral cystic changes. There is osseous coalition seen at the second metatarsal cuneiform joint and as well with the medial and intermediate cuneiform. No large joint effusion is noted. There is cystic changes seen at the talonavicular joint with joint space  loss as well as at the naviculocuneiform  joint. Ligaments The Lisfranc ligaments and collateral ligaments appear to be intact. Muscles and Tendons There is diffusely increased signal seen throughout the muscles, likely due to microvascular disease and denervation atrophy. The flexor and extensor tendons are intact. The plantar fascia is intact. Soft tissues There is focal area of skin irregularity/superficial ulceration seen on the plantar surface of the distal first digit with skin thickening. There is diffuse subcutaneous edema. No loculated fluid collection or sinus tract however is seen. There is mild dorsal soft tissue swelling. IMPRESSION: Subtle skin ulceration with probable findings of cellulitis involving the distal digit of the first toe. No sinus tract or abscess is seen. There is question of early osteomyelitis involving the plantar surface of the distal first phalanx with a focal area of erosive type change. Probable reactive marrow seen throughout the distal and proximal first phalanx. 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 Osteoarthritis as described above. Electronically Signed   By: Jonna Clark M.D.   On: 02/01/2020 22:06   DG Chest Portable 1 View  Result Date: 02/01/2020 CLINICAL DATA:  Generalized weakness, shortness of breath EXAM: PORTABLE CHEST 1 VIEW COMPARISON:  None FINDINGS: Heart is normal size. Lingular scarring. Right lung clear. No effusions or acute bony abnormality. IMPRESSION: No active disease. Electronically Signed   By: Charlett Nose M.D.   On: 02/01/2020 09:49   VAS [place] ABI WITH/WO TBI  Result Date: 02/02/2020 LOWER EXTREMITY DOPPLER STUDY Indications: Ulceration, and bilateral foot ulcers. High Risk Factors: Hypertension, Diabetes.  Comparison Study: no prior Performing Technologist: Blanch Media RVS  Examination Guidelines: A complete evaluation includes at minimum, Doppler waveform signals and systolic blood pressure reading at the level  of bilateral brachial, anterior tibial, and posterior tibial arteries, when vessel segments are accessible. Bilateral testing is considered an integral part of a complete examination. Photoelectric Plethysmograph (PPG) waveforms and toe systolic pressure readings are included as required and additional duplex testing as needed. Limited examinations for reoccurring indications may be performed as noted.  ABI Findings: +---------+------------------+-----+---------+--------+ Right    Rt Pressure (mmHg)IndexWaveform Comment  +---------+------------------+-----+---------+--------+ Brachial 1434                   triphasic         +---------+------------------+-----+---------+--------+ PTA      117               0.82 triphasic         +---------+------------------+-----+---------+--------+ DP       144               1.01 triphasic         +---------+------------------+-----+---------+--------+ Great Toe68                0.05                   +---------+------------------+-----+---------+--------+ +---------+------------------+-----+---------+-------+ Left     Lt Pressure (mmHg)IndexWaveform Comment +---------+------------------+-----+---------+-------+ Brachial 134                    triphasic        +---------+------------------+-----+---------+-------+ PTA      123               0.86 triphasic        +---------+------------------+-----+---------+-------+ DP       127  0.89 triphasic        +---------+------------------+-----+---------+-------+ Great Toe68                0.05                  +---------+------------------+-----+---------+-------+ +-------+-----------+-----------+------------+------------+ ABI/TBIToday's ABIToday's TBIPrevious ABIPrevious TBI +-------+-----------+-----------+------------+------------+ Right  1.10                                           +-------+-----------+-----------+------------+------------+ Left    0.89                                           +-------+-----------+-----------+------------+------------+  Summary: Right: Resting right ankle-brachial index is within normal range. No evidence of significant right lower extremity arterial disease. The right toe-brachial index is abnormal. Left: Resting left ankle-brachial index indicates mild left lower extremity arterial disease. The left toe-brachial index is abnormal.  *See table(s) above for measurements and observations.  Electronically signed by Sherald Hesshristopher Clark MD on 02/02/2020 at 4:35:41 PM.    Final    - pertinent xrays, CT, MRI studies were reviewed and independently interpreted  Positive ROS: All other systems have been reviewed and were otherwise negative with the exception of those mentioned in the HPI and as above.  Physical Exam: General: Alert, no acute distress Psychiatric: Patient is competent for consent with normal mood and affect Lymphatic: No axillary or cervical lymphadenopathy Cardiovascular: No pedal edema Respiratory: No cyanosis, no use of accessory musculature GI: No organomegaly, abdomen is soft and non-tender    Images:  @ENCIMAGES @  Labs:  Lab Results  Component Value Date   HGBA1C 11.3 (H) 02/01/2020   ESRSEDRATE 84 (H) 02/01/2020   CRP 14.3 (H) 02/01/2020   REPTSTATUS PENDING 02/01/2020   CULT (A) 02/01/2020    50,000 COLONIES/mL STAPHYLOCOCCUS AUREUS SUSCEPTIBILITIES TO FOLLOW Performed at [facility], [address] [place], [place] [address]     Lab Results  Component Value Date   ALBUMIN 2.3 (L) 02/01/2020   PREALBUMIN 5.3 (L) 02/01/2020    Neurologic: Patient does not have protective sensation bilateral lower extremities.   MUSCULOSKELETAL:   Skin: Examination of the left foot patient has a ulcer over the plantar medial aspect of the left great toe.  There is a small amount of purulent drainage patient has a strong dorsalis pedis pulse ankle-brachial indices shows good  circulation.  Examination of the right foot patient has black gangrenous ulcers beneath the first fourth and fifth metatarsal heads on the right foot he has a strong dorsalis pedis pulse on the right foot ankle-brachial indices shows good circulation.  Patient's hemoglobin A1c is 11.3 albumin 2.3.   Review of the MRI scan shows osteomyelitis involving the fourth and fifth metatarsal heads with possible osteomyelitis of the left great toe.  Assessment: Assessment: Uncontrolled undiagnosed type 2 diabetes with severe protein caloric malnutrition with osteomyelitis of the fourth and fifth metatarsal heads of the right foot and a superficial abscess beneath the great toe of the left foot.  Plan: Plan: We will plan for a transmetatarsal amputation on the right foot debridement of the ulcer on the left great toe.  Risks and benefits were discussed including risk of the wound not healing need for additional surgery.  Patient states he  understands wishes to proceed at this time.  Plan for surgery Saturday morning at 10 AM.  Thank you for the consult and the opportunity to see Mr. Takai Chiaramonte, MD [facility] [phone] 5:00 PM

## 2020-02-02 NOTE — Progress Notes (Signed)
PROGRESS NOTE  Chris Bowman CWU:[id number] DOB: 01-04-1961 DOA: 02/01/2020 PCP: Patient, No Pcp Per  HPI/Recap of past 24 hours:  Fever coming down some He is tmax about to have echocardiogram done, no acute complaints  Assessment/Plan: Principal Problem:   Sepsis due to undetermined organism [facility]) Active Problems:   Type 2 diabetes mellitus, uncontrolled ([place])   Diabetic foot ulcer ([place])   Hyponatremia   Renal insufficiency   Weakness   Falls frequently  Sepsis /M SSA bacteremia/bilateral foot osteomyelitis He is started on Ancef Ancef Leukocytosis WBC 24.6 on presentation, down to 18.4 this morning Echo to rule out endocarditis Repeat blood culture ID /orthoconsult  Hyponatremia He is started on LR on presentation ,continue LR, repeat BMP  DM, uncontrolled -Apparently did not believe he had DM and so didn't go back to a physician since about 2008 -DM coordinator consult -A1c is 11.3 -Start Levemir 6 units twice daily, continue SSI, continue adjust insulin  AKI UA with large hemoglobin, trace leukocyte, few bacteria Urine culture positive for staph aureus Will check ck and Renal ultrasound Renal dosing meds  lft elevation Hepatitis panel negative Will check ck and Abdominal ultrasound  Microcytic anemia Hgb 9.8 ,MCV 68.8 UA showed 10-20 RBC/HPF Anemia work-up  History of delusional disorder Currently calm  DVT Prophylaxis: Lovenox subcu ordered on presentation, will hold for now, possible needing orthopedic procedure, start SCD  Code Status: full  Family Communication: patient   Disposition Plan:    Patient came from:         home                                                                                                 Anticipated d/c place: SNF  Barriers to d/c OR conditions which need to be met to effect a safe d/c:  Multiple lab abnormalities, treating bacteremia, osteomyelitis, not medically ready to  discharge   Consultants:  ID  Neurology  Procedures:  None  Antibiotics:  Ancef   Objective: BP 122/63 (BP Location: Right Arm)   Pulse 97   Temp (!) 100.6 F (38.1 C) (Oral)   Resp 18   Ht 5' 8"  (1.727 m)   Wt 83 kg   SpO2 94%   BMI 27.82 kg/m   Intake/Output Summary (Last 24 hours) at 02/02/2020 0941 Last data filed at 02/02/2020 0939 Gross per 24 hour  Intake 3521.95 ml  Output 4250 ml  Net -728.05 ml   Filed Weights   02/01/20 0801 02/01/20 2300  Weight: 68 kg 83 kg    Exam: Patient is examined daily including today on 02/02/2020, exams remain the same as of yesterday except that has changed    General:  Alert and calm  Cardiovascular: RRR  Respiratory: CTABL  Abdomen: Soft/ND/NT, positive BS  Musculoskeletal: + foot ulcers  Neuro: alert, oriented   Data Reviewed: Basic Metabolic Panel: Recent Labs  Lab 02/01/20 0805 02/02/20 0734  NA 120* 121*  K 4.2 4.0  CL 85* 90*  CO2 19* 19*  GLUCOSE 316* 271*  BUN 42* 34*  CREATININE 2.33* 1.68*  CALCIUM 8.6* 8.0*   Liver Function Tests: Recent Labs  Lab 02/01/20 0805  AST 75*  ALT 109*  ALKPHOS 317*  BILITOT 1.2  PROT 8.2*  ALBUMIN 2.3*   No results for input(s): LIPASE, AMYLASE in the last 168 hours. No results for input(s): AMMONIA in the last 168 hours. CBC: Recent Labs  Lab 02/01/20 0805 02/01/20 0930 02/02/20 0734  WBC 24.6*  --  18.4*  NEUTROABS  --  19.3*  --   HGB 11.7*  --  9.8*  HCT 36.5*  --  30.2*  MCV 70.5*  --  68.8*  PLT 441*  --  303   Cardiac Enzymes:   Recent Labs  Lab 02/01/20 1551  CKTOTAL 179   BNP (last 3 results) No results for input(s): BNP in the last 8760 hours.  ProBNP (last 3 results) No results for input(s): PROBNP in the last 8760 hours.  CBG: Recent Labs  Lab 02/01/20 0842 02/01/20 1154 02/01/20 2048 02/01/20 2318 02/02/20 0650  GLUCAP 298* 276* 232* 338* 271*    Recent Results (from the past 240 hour(s))  Culture, blood  (routine x 2)     Status: Abnormal (Preliminary result)   Collection Time: 02/01/20  9:20 AM   Specimen: BLOOD LEFT ARM  Result Value Ref Range Status   Specimen Description BLOOD LEFT ARM  Final   Special Requests   Final    BOTTLES DRAWN AEROBIC AND ANAEROBIC Blood Culture adequate volume   Culture  Setup Time   Final    IN BOTH AEROBIC AND ANAEROBIC BOTTLES GRAM POSITIVE COCCI IN CLUSTERS CRITICAL RESULT CALLED TO, READ BACK BY AND VERIFIED WITH: T DANG PHARMD 02/01/20 2244 JDW    Culture (A)  Final    STAPHYLOCOCCUS AUREUS CULTURE REINCUBATED FOR BETTER GROWTH Performed at [facility], [place] [address] [place], [place] [address]    Report Status PENDING  Incomplete  Culture, blood (routine x 2)     Status: None (Preliminary result)   Collection Time: 02/01/20  9:30 AM   Specimen: BLOOD LEFT HAND  Result Value Ref Range Status   Specimen Description BLOOD LEFT HAND  Final   Special Requests   Final    BOTTLES DRAWN AEROBIC ONLY Blood Culture results may not be optimal due to an inadequate volume of blood received in culture bottles   Culture  Setup Time   Final    AEROBIC BOTTLE ONLY GRAM POSITIVE COCCI IN CLUSTERS Organism ID to follow CRITICAL RESULT CALLED TO, READ BACK BY AND VERIFIED WITHAlona Bene [facility] 02/02/20 2297 JDW Performed at [facility], [place] [address] [place], [place] [address]    Culture PENDING  Incomplete   Report Status PENDING  Incomplete  Blood Culture ID Panel (Reflexed)     Status: Abnormal   Collection Time: 02/01/20  9:30 AM  Result Value Ref Range Status   Enterococcus species NOT DETECTED NOT DETECTED Final   Listeria monocytogenes NOT DETECTED NOT DETECTED Final   Staphylococcus species DETECTED (A) NOT DETECTED Final    Comment: CRITICAL RESULT CALLED TO, READ BACK BY AND VERIFIED WITH: V BRYK PHARMD 02/02/20 0213 JDW    Staphylococcus aureus (BCID) DETECTED (A) NOT DETECTED Final    Comment: Methicillin (oxacillin) susceptible  Staphylococcus aureus (MSSA). Preferred therapy is anti staphylococcal beta lactam antibiotic (Cefazolin or Nafcillin), unless clinically contraindicated. CRITICAL RESULT CALLED TO, READ BACK BY AND VERIFIED WITH: V BRYK PHARMD 02/02/20 0213 JDW    Methicillin  resistance NOT DETECTED NOT DETECTED Final   Streptococcus species NOT DETECTED NOT DETECTED Final   Streptococcus agalactiae NOT DETECTED NOT DETECTED Final   Streptococcus pneumoniae NOT DETECTED NOT DETECTED Final   Streptococcus pyogenes NOT DETECTED NOT DETECTED Final   Acinetobacter baumannii NOT DETECTED NOT DETECTED Final   Enterobacteriaceae species NOT DETECTED NOT DETECTED Final   Enterobacter cloacae complex NOT DETECTED NOT DETECTED Final   Escherichia coli NOT DETECTED NOT DETECTED Final   Klebsiella oxytoca NOT DETECTED NOT DETECTED Final   Klebsiella pneumoniae NOT DETECTED NOT DETECTED Final   Proteus species NOT DETECTED NOT DETECTED Final   Serratia marcescens NOT DETECTED NOT DETECTED Final   Haemophilus influenzae NOT DETECTED NOT DETECTED Final   Neisseria meningitidis NOT DETECTED NOT DETECTED Final   Pseudomonas aeruginosa NOT DETECTED NOT DETECTED Final   Candida albicans NOT DETECTED NOT DETECTED Final   Candida glabrata NOT DETECTED NOT DETECTED Final   Candida krusei NOT DETECTED NOT DETECTED Final   Candida parapsilosis NOT DETECTED NOT DETECTED Final   Candida tropicalis NOT DETECTED NOT DETECTED Final    Comment: Performed at [facility], [place] [address] [place], [place] [address]  SARS CORONAVIRUS 2 (TAT 6-24 HRS) Nasopharyngeal Nasopharyngeal Swab     Status: None   Collection Time: 02/01/20 10:26 AM   Specimen: Nasopharyngeal Swab  Result Value Ref Range Status   SARS Coronavirus 2 NEGATIVE NEGATIVE Final    Comment: (NOTE) SARS-CoV-2 target nucleic acids are NOT DETECTED. The SARS-CoV-2 RNA is generally detectable in upper and lower respiratory specimens during the acute phase of  infection. Negative results do not preclude SARS-CoV-2 infection, do not rule out co-infections with other pathogens, and should not be used as the sole basis for treatment or other patient management decisions. Negative results must be combined with clinical observations, patient history, and epidemiological information. The expected result is Negative. Fact Sheet for Patients: SugarRoll.be Fact Sheet for Healthcare Providers: https://www.woods-mathews.com/ This test is not yet approved or cleared by the [place] FDA and  has been authorized for detection and/or diagnosis of SARS-CoV-2 by FDA under an Emergency Use Authorization (EUA). This EUA will remain  in effect (meaning this test can be used) for the duration of the COVID-19 declaration under Section 56 4(b)(1) of the Act, 21 U.S.C. section 360bbb-3(b)(1), unless the authorization is terminated or revoked sooner. Performed at [facility], [place] [address], [place], [place] [address]      Studies: DG Shoulder Right  Result Date: 02/01/2020 CLINICAL DATA:  59 year old male with fall and right shoulder pain. EXAM: RIGHT SHOULDER - 2+ VIEW COMPARISON:  Chest radiograph dated 02/01/2020. FINDINGS: There is no acute fracture or dislocation. No significant arthritic changes. Mild elevation of the right humeral head may represent chronic rotator cuff injury. There is degenerative changes of the right AC joint. The soft tissues are unremarkable. IMPRESSION: No acute fracture or dislocation. Electronically Signed   By: Anner Crete M.D.   On: 02/01/2020 19:17   MR BRAIN WO CONTRAST  Result Date: 02/01/2020 CLINICAL DATA:  Generalized weakness, gait instability, and multiple falls. EXAM: MRI HEAD WITHOUT CONTRAST MRI CERVICAL SPINE WITHOUT CONTRAST TECHNIQUE: Multiplanar, multiecho pulse sequences of the brain and surrounding structures, and cervical spine, to include the craniocervical  junction and cervicothoracic junction, were obtained without intravenous contrast. COMPARISON:  None. FINDINGS: MRI HEAD FINDINGS The study is mildly motion degraded. Brain: There is no evidence of acute infarct, intracranial hemorrhage, mass, midline shift,  or extra-axial fluid collection. There is mild cerebral atrophy. Periventricular white matter T2 hyperintensities are nonspecific but compatible with minimal chronic small vessel ischemic disease. Vascular: Major intracranial vascular flow voids are preserved. Skull and upper cervical spine: No suspicious marrow lesion. Sinuses/Orbits: Unremarkable orbits. Minimal left ethmoid air cell mucosal thickening. At most trace right mastoid fluid. Other: None. MRI CERVICAL SPINE FINDINGS The study is motion degraded throughout including severe motion on the axial sequences. Alignment: No significant listhesis. Vertebrae: No fracture or suspicious marrow lesion. Chronic degenerative endplate changes at A3-5 associated with severe disc space narrowing. Moderate disc space narrowing at C4-5. No discal fluid signal or frank vertebral marrow edema to indicate discitis or osteomyelitis. Small right facet joint effusion at C5-6 without substantial facet marrow edema. Cord: No cord signal abnormality identified within limitations of motion artifact. Posterior Fossa, vertebral arteries, paraspinal tissues: There is prevertebral edema, and there are apparent fluid collections along the right anterolateral aspects of the C3-C5 vertebral bodies measuring up to approximately 4 cm in craniocaudal length and with a maximal transverse diameter of approximately 1.3 cm. There is a diffusely edematous appearance of the right scalene musculature more inferiorly in the neck. The vertebral artery flow voids are grossly preserved bilaterally. Disc levels: A small ventral epidural fluid collection is suspected asymmetrically to the right of midline extending from the C3 inferior to C5 superior  endplate levels (for example series 5, image 7 and series 10, image 22). This measures 2-2.5 cm in craniocaudal length and less than 1 cm in transverse dimension and contributes to mild spinal stenosis at these levels without frank cord compression. Detailed assessment of degenerative changes is limited by motion, however there is no significant spinal stenosis elsewhere. Disc bulging and uncovertebral spurring result in neural foraminal stenosis which is likely moderate bilaterally at C3-4, severe on the right and moderate to severe on the left at C4-5, mild on the right at C5-6, and moderate on the right at C6-7. IMPRESSION: MRI HEAD: 1. No acute intracranial abnormality. 2. Mild cerebral atrophy and chronic small vessel ischemia. MRI CERVICAL SPINE: 1. Severely motion degraded examination. 2. Prevertebral edema with right-sided paravertebral fluid collections in the mid to lower cervical spine concerning for infection and abscesses with suspected myositis involving the right-sided scalene musculature. 3. Suspected small ventral epidural abscess or phlegmon from C3-C5 with mild spinal stenosis. No cord compression. 4. No definite evidence of discitis or osteomyelitis. 5. Small volume facet joint fluid on the right at C5-6 is favored to be degenerative although septic arthritis is not excluded. These results will be called to the ordering clinician or representative by the Radiologist Assistant, and communication documented in the PACS or zVision Dashboard. Electronically Signed   By: Logan Bores M.D.   On: 02/01/2020 19:38   MR CERVICAL SPINE WO CONTRAST  Result Date: 02/01/2020 CLINICAL DATA:  Generalized weakness, gait instability, and multiple falls. EXAM: MRI HEAD WITHOUT CONTRAST MRI CERVICAL SPINE WITHOUT CONTRAST TECHNIQUE: Multiplanar, multiecho pulse sequences of the brain and surrounding structures, and cervical spine, to include the craniocervical junction and cervicothoracic junction, were obtained  without intravenous contrast. COMPARISON:  None. FINDINGS: MRI HEAD FINDINGS The study is mildly motion degraded. Brain: There is no evidence of acute infarct, intracranial hemorrhage, mass, midline shift, or extra-axial fluid collection. There is mild cerebral atrophy. Periventricular white matter T2 hyperintensities are nonspecific but compatible with minimal chronic small vessel ischemic disease. Vascular: Major intracranial vascular flow voids are preserved. Skull and upper cervical spine:  No suspicious marrow lesion. Sinuses/Orbits: Unremarkable orbits. Minimal left ethmoid air cell mucosal thickening. At most trace right mastoid fluid. Other: None. MRI CERVICAL SPINE FINDINGS The study is motion degraded throughout including severe motion on the axial sequences. Alignment: No significant listhesis. Vertebrae: No fracture or suspicious marrow lesion. Chronic degenerative endplate changes at O3-7 associated with severe disc space narrowing. Moderate disc space narrowing at C4-5. No discal fluid signal or frank vertebral marrow edema to indicate discitis or osteomyelitis. Small right facet joint effusion at C5-6 without substantial facet marrow edema. Cord: No cord signal abnormality identified within limitations of motion artifact. Posterior Fossa, vertebral arteries, paraspinal tissues: There is prevertebral edema, and there are apparent fluid collections along the right anterolateral aspects of the C3-C5 vertebral bodies measuring up to approximately 4 cm in craniocaudal length and with a maximal transverse diameter of approximately 1.3 cm. There is a diffusely edematous appearance of the right scalene musculature more inferiorly in the neck. The vertebral artery flow voids are grossly preserved bilaterally. Disc levels: A small ventral epidural fluid collection is suspected asymmetrically to the right of midline extending from the C3 inferior to C5 superior endplate levels (for example series 5, image 7 and  series 10, image 22). This measures 2-2.5 cm in craniocaudal length and less than 1 cm in transverse dimension and contributes to mild spinal stenosis at these levels without frank cord compression. Detailed assessment of degenerative changes is limited by motion, however there is no significant spinal stenosis elsewhere. Disc bulging and uncovertebral spurring result in neural foraminal stenosis which is likely moderate bilaterally at C3-4, severe on the right and moderate to severe on the left at C4-5, mild on the right at C5-6, and moderate on the right at C6-7. IMPRESSION: MRI HEAD: 1. No acute intracranial abnormality. 2. Mild cerebral atrophy and chronic small vessel ischemia. MRI CERVICAL SPINE: 1. Severely motion degraded examination. 2. Prevertebral edema with right-sided paravertebral fluid collections in the mid to lower cervical spine concerning for infection and abscesses with suspected myositis involving the right-sided scalene musculature. 3. Suspected small ventral epidural abscess or phlegmon from C3-C5 with mild spinal stenosis. No cord compression. 4. No definite evidence of discitis or osteomyelitis. 5. Small volume facet joint fluid on the right at C5-6 is favored to be degenerative although septic arthritis is not excluded. These results will be called to the ordering clinician or representative by the Radiologist Assistant, and communication documented in the PACS or zVision Dashboard. Electronically Signed   By: Logan Bores M.D.   On: 02/01/2020 19:38   MR FOOT RIGHT WO CONTRAST  Result Date: 02/01/2020 CLINICAL DATA:  Osteomyelitis EXAM: MRI OF THE RIGHT FOREFOOT WITHOUT CONTRAST TECHNIQUE: Multiplanar, multisequence MR imaging of the right was performed. No intravenous contrast was administered. COMPARISON:  Radiograph November 29, 2007 FINDINGS: Bones/Joint/Cartilage There is increased marrow signal seen through the fifth metatarsal head, proximal fifth phalanx, fourth metatarsal head,  and proximal fourth phalanx with associated T1 hypointensity. This could be due to early osteomyelitis. Increased marrow signal seen within the distal fourth and fifth phalanges, mid fifth metatarsal shaft, second and third proximal phalanges, likely due to reactive marrow. There is large cystic type lucency seen within the navicular as on prior exam. 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 There is also osseous coalition between the second metatarsal and intermediate cuneiform as well as the medial and lateral cuneiform is. 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 Ligaments The Lisfranc ligaments and collateral ligaments are intact. Muscles and Tendons Diffusely increased signal seen throughout the muscles, likely due to microvascular disease and denervation atrophy. The flexor and extensor tendons appear to be intact. The plantar fascia is intact. Soft tissues There is focal area of superficial ulceration seen on the plantar surface of the midfoot between the fourth and fifth metatarsal heads measuring 1.7 cm in transverse dimension, series 5, image 28. No definite loculated fluid collection or sinus tract is seen. There is mild dorsal soft tissue swelling. IMPRESSION: 1. Focal area of superficial ulceration on the plantar surface beneath the fourth and fifth metatarsal heads. For no sinus tract or abscess. Findings which could be suggestive of osteomyelitis involving the fourth and fifth metatarsal heads and proximal phalanges. 2. Probable reactive marrow within the distal fourth and fifth phalanges, mid fifth metatarsal shaft, and second and third proximal phalanges. 3. Osseous coalition as described above. Electronically Signed   By: Prudencio Pair M.D.   On: 02/01/2020 22:12   MR FOOT LEFT WO CONTRAST  Result Date:  02/01/2020 CLINICAL DATA:  Osteomyelitis of the foot EXAM: MRI OF THE LEFT FOOT WITHOUT CONTRAST TECHNIQUE: Multiplanar, multisequence MR imaging of the left was performed. No intravenous contrast was administered. COMPARISON:  None. FINDINGS: Bones/Joint/Cartilage There is question of a tiny focal area of early erosive type change seen on the plantar surface of the distal phalanx series 4, image 44. Increased marrow signal seen throughout the distal phalanx of the first digit and the proximal first phalanx at the interphalangeal joint. No definite area of cortical destruction is seen. There is interphalangeal joint and MTP joint osteoarthritis at the first digit with joint space loss and subchondral cystic changes. There is osseous coalition seen at the second metatarsal cuneiform joint and as well with the medial and intermediate cuneiform. No large joint effusion is noted. There is cystic changes seen at the talonavicular joint with joint space loss as well as at the naviculocuneiform joint. Ligaments The Lisfranc ligaments and collateral ligaments appear to be intact. Muscles and Tendons There is diffusely increased signal seen throughout the muscles, likely due to microvascular disease and denervation atrophy. The flexor and extensor tendons are intact. The plantar fascia is intact. Soft tissues There is focal area of skin irregularity/superficial ulceration seen on the plantar surface of the distal first digit with skin thickening. There is diffuse subcutaneous edema. No loculated fluid collection or sinus tract however is seen. There is mild dorsal soft tissue swelling. IMPRESSION: Subtle skin ulceration with probable findings of cellulitis involving the distal digit of the first toe. No sinus tract or abscess is seen. There is question of early osteomyelitis involving the plantar surface of the distal first phalanx with a focal area of erosive type change. Probable reactive marrow seen throughout the distal  and proximal first phalanx. 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 Osteoarthritis as described above. Electronically Signed   By: Prudencio Pair M.D.   On: 02/01/2020 22:06   DG Chest Portable 1 View  Result Date: 02/01/2020 CLINICAL DATA:  Generalized weakness, shortness of breath EXAM: PORTABLE CHEST 1 VIEW COMPARISON:  None FINDINGS: Heart is normal size. Lingular scarring. Right lung clear. No effusions or acute bony abnormality. IMPRESSION: No active disease. Electronically Signed   By: Rolm Baptise M.D.   On:  02/01/2020 09:49   VAS [place] ABI WITH/WO TBI  Result Date: 02/02/2020 LOWER EXTREMITY DOPPLER STUDY Indications: Ulceration. High Risk Factors: Hypertension, Diabetes.  Comparison Study: no prior Performing Technologist: Abram Sander RVS  Examination Guidelines: A complete evaluation includes at minimum, Doppler waveform signals and systolic blood pressure reading at the level of bilateral brachial, anterior tibial, and posterior tibial arteries, when vessel segments are accessible. Bilateral testing is considered an integral part of a complete examination. Photoelectric Plethysmograph (PPG) waveforms and toe systolic pressure readings are included as required and additional duplex testing as needed. Limited examinations for reoccurring indications may be performed as noted.  ABI Findings: +---------+------------------+-----+---------+--------+ Right    Rt Pressure (mmHg)IndexWaveform Comment  +---------+------------------+-----+---------+--------+ Brachial 1434                   triphasic         +---------+------------------+-----+---------+--------+ PTA      117               0.08 triphasic         +---------+------------------+-----+---------+--------+ DP       144               0.10 triphasic         +---------+------------------+-----+---------+--------+ Great Toe68                0.05                    +---------+------------------+-----+---------+--------+ +---------+------------------+-----+---------+-------+ Left     Lt Pressure (mmHg)IndexWaveform Comment +---------+------------------+-----+---------+-------+ Brachial 134                    triphasic        +---------+------------------+-----+---------+-------+ PTA      123               0.09 triphasic        +---------+------------------+-----+---------+-------+ DP       127               0.09 triphasic        +---------+------------------+-----+---------+-------+ Great Toe68                0.05                  +---------+------------------+-----+---------+-------+ +-------+-----------+-----------+------------+------------+ ABI/TBIToday's ABIToday's TBIPrevious ABIPrevious TBI +-------+-----------+-----------+------------+------------+ Right  1.10                                           +-------+-----------+-----------+------------+------------+ Left   0.89                                           +-------+-----------+-----------+------------+------------+  Summary: Right: Resting right ankle-brachial index is within normal range. No evidence of significant right lower extremity arterial disease. Left: Resting left ankle-brachial index indicates mild left lower extremity arterial disease.  *See table(s) above for measurements and observations.    Preliminary     Scheduled Meds: . Chlorhexidine Gluconate Cloth  6 each Topical Daily  . docusate sodium  100 mg Oral BID  . enoxaparin (LOVENOX) injection  40 mg Subcutaneous Q24H  . insulin aspart  0-15 Units Subcutaneous TID WC  . insulin aspart  0-5 Units Subcutaneous QHS  . sodium chloride flush  3 mL  Intravenous Q12H    Continuous Infusions: .  ceFAZolin (ANCEF) IV 2 g (02/02/20 0524)  . lactated ringers 100 mL/hr at 02/01/20 2322     Time spent: 50mns, case discussed with wound care RN and orthopedic Dr. DSharol GivenI have personally  reviewed and interpreted on  02/02/2020 daily labs, tele strips, imagings as discussed above under date review session and assessment and plans.  I reviewed all nursing notes, pharmacy notes, consultant notes,  vitals, pertinent old records  I have discussed plan of care as described above with RN , patient  on 02/02/2020   FFlorencia ReasonsMD, PhD, FACP  [facility]  Available via Epic secure chat 7am-7pm for nonurgent issues Please page for urgent issues, pager number available through [email or web address] .   02/02/2020, 9:41 AM  LOS: 1 day

## 2020-02-02 NOTE — Evaluation (Signed)
Occupational Therapy Evaluation Patient Details Name: Chris Bowman MRN: [id number] DOB: Sep 09, 1961 Today's Date: 02/02/2020    History of Present Illness 59 y.o. male with history of uncontrolled diabetes, essential hypertension, delusional disorders. bilateral feet wounds with early indication of osteomyelitis per MRI results from 3/4   Clinical Impression   Pt admitted with the above diagnoses and presents with below problem list. Pt will benefit from continued acute OT to address the below listed deficits and maximize independence with basic ADLs prior to d/c to venue below. PTA pt has been struggling recently with basic ADLs and functional transfers and with limited mobility. Pt with h/o falls at home. Currently pt is mod A with UB ADLs, max A with LB ADLs +2 safety. Pt able to sidestep EOB with min A +2 and use of rw this session.      Follow Up Recommendations  SNF    Equipment Recommendations  Other (comment)(defer to next venue)    Recommendations for Other Services       Precautions / Restrictions Precautions Precautions: Fall Precaution Comments: B feet wounds Restrictions Weight Bearing Restrictions: No      Mobility Bed Mobility Overal bed mobility: Needs Assistance Bed Mobility: Supine to Sit;Sit to Supine     Supine to sit: Mod assist Sit to supine: Max assist   General bed mobility comments: mod A to advance BLE and fully advance hips to EOB position. Max A for all aspects to return to supine  Transfers Overall transfer level: Needs assistance Equipment used: Rolling walker (2 wheeled) Transfers: Sit to/from Stand Sit to Stand: Mod assist;+2 safety/equipment         General transfer comment: assist to powerup, steady and sequence movements with rw.     Balance Overall balance assessment: Needs assistance;History of Falls Sitting-balance support: Bilateral upper extremity supported;Feet supported;Single extremity supported Sitting balance-Leahy  Scale: Fair Sitting balance - Comments: sits mostly in BUE support position   Standing balance support: Bilateral upper extremity supported;During functional activity Standing balance-Leahy Scale: Poor Standing balance comment: rw and min A to steady                           ADL either performed or assessed with clinical judgement   ADL Overall ADL's : Needs assistance/impaired Eating/Feeding: Set up;Sitting   Grooming: Moderate assistance;Sitting   Upper Body Bathing: Moderate assistance;Sitting   Lower Body Bathing: Maximal assistance;+2 for safety/equipment;Sit to/from stand   Upper Body Dressing : Moderate assistance;Sitting   Lower Body Dressing: Maximal assistance;+2 for safety/equipment;Sit to/from stand                 General ADL Comments: Pt completed bed mobility, stood EOB and sidestepped.      Vision         Perception     Praxis      Pertinent Vitals/Pain Pain Assessment: Faces Faces Pain Scale: Hurts even more Pain Location: neck and back, generalized Pain Descriptors / Indicators: Aching;Sore;Guarding;Grimacing Pain Intervention(s): Limited activity within patient's tolerance;Monitored during session;Repositioned;Heat applied     Hand Dominance     Extremity/Trunk Assessment Upper Extremity Assessment Upper Extremity Assessment: Generalized weakness   Lower Extremity Assessment Lower Extremity Assessment: Defer to PT evaluation   Cervical / Trunk Assessment Cervical / Trunk Assessment: Kyphotic   Communication Communication Communication: No difficulties   Cognition Arousal/Alertness: Awake/alert Behavior During Therapy: Flat affect;Anxious Overall Cognitive Status: Difficult to assess  General Comments: does better with one step commands. anxious. possible underlying psych diagnosis per chart review   General Comments       Exercises     Shoulder Instructions       Home Living Family/patient expects to be discharged to:: Private residence Living Arrangements: Alone Available Help at Discharge: Family;Available PRN/intermittently Type of Home: Apartment Home Access: Stairs to enter Entrance Stairs-Number of Steps: 5 Entrance Stairs-Rails: Right("shaky" thinks it contributed to fall) Home Layout: One level                   Additional Comments: sister present, lives locally      Prior Functioning/Environment Level of Independence: Independent with assistive device(s);Needs assistance  Gait / Transfers Assistance Needed: struggling with transfers and mobility. h/o falls ADL's / Homemaking Assistance Needed: struggling at home with UB/LB ADLs and functional transfers and mobility            OT Problem List: Decreased strength;Decreased activity tolerance;Impaired balance (sitting and/or standing);Decreased cognition;Decreased safety awareness;Decreased knowledge of use of DME or AE;Decreased knowledge of precautions;Pain      OT Treatment/Interventions: Self-care/ADL training;Therapeutic exercise;Energy conservation;DME and/or AE instruction;Therapeutic activities;Cognitive remediation/compensation;Patient/family education;Balance training    OT Goals(Current goals can be found in the care plan section) Acute Rehab OT Goals Patient Stated Goal: sister present during session OT Goal Formulation: With patient Time For Goal Achievement: 02/16/20 Potential to Achieve Goals: Good ADL Goals Pt Will Perform Grooming: with min assist;sitting Pt Will Perform Upper Body Bathing: with min assist;sitting Pt Will Perform Lower Body Bathing: with min assist;sit to/from stand Pt Will Transfer to Toilet: with min assist;stand pivot transfer Pt Will Perform Toileting - Clothing Manipulation and hygiene: with min assist;sit to/from stand  OT Frequency: Min 2X/week   Barriers to D/C:            Co-evaluation PT/OT/SLP  Co-Evaluation/Treatment: Yes Reason for Co-Treatment: Necessary to address cognition/behavior during functional activity;Complexity of the patient's impairments (multi-system involvement);For patient/therapist safety;To address functional/ADL transfers   OT goals addressed during session: ADL's and self-care      AM-PAC OT "6 Clicks" Daily Activity     Outcome Measure Help from another person eating meals?: None Help from another person taking care of personal grooming?: A Lot Help from another person toileting, which includes using toliet, bedpan, or urinal?: A Lot Help from another person bathing (including washing, rinsing, drying)?: A Lot Help from another person to put on and taking off regular upper body clothing?: A Lot Help from another person to put on and taking off regular lower body clothing?: A Lot 6 Click Score: 14   End of Session Equipment Utilized During Treatment: Rolling walker Nurse Communication: Mobility status  Activity Tolerance: Patient limited by pain;Patient limited by fatigue Patient left: in bed;with call bell/phone within reach;with bed alarm set;with family/visitor present  OT Visit Diagnosis: Unsteadiness on feet (R26.81);Other abnormalities of gait and mobility (R26.89);History of falling (Z91.81);Muscle weakness (generalized) (M62.81);Other symptoms and signs involving cognitive function;Pain                Time: 2694-8546 OT Time Calculation (min): 29 min Charges:  OT General Charges $OT Visit: 1 Visit OT Evaluation $OT Eval Moderate Complexity: 1 Mod  Raynald Kemp, OT Acute Rehabilitation Services Pager: [phone] Office: [phone]   Pilar Grammes 02/02/2020, 12:21 PM

## 2020-02-02 NOTE — TOC Progression Note (Addendum)
Transition of Care [facility]) - Progression Note    Patient Details  Name: Chris Bowman MRN: [id number] Date of Birth: 26-Oct-1961  Transition of [facility]) CM/SW Contact  Okey Dupre Lazaro Arms, LCSW Phone Number: 02/02/2020, 3:59 PM  Clinical Narrative:   Consult received consult 3/4 for HH/DME needs and SNF placement. Patient has no insurance and call made to Christia Reading [phone]) financial counselor and message left to determine if patient has a pending Medicaid application. CSW will follow-up with financial counselor on Monday.   *Room visit - 4:01 pm and patient not in the room (in ultrasound).        Expected Discharge Plan and Services                                                 Social Determinants of Health (SDOH) Interventions    Readmission Risk Interventions No flowsheet data found.

## 2020-02-02 NOTE — Progress Notes (Signed)
Notified on call provider NP Bodenheimer pt. CBG result 338 mg/dL. Awaiting call back

## 2020-02-02 NOTE — Plan of Care (Signed)
  Problem: Safety: Goal: Ability to remain free from injury will improve Outcome: Progressing   

## 2020-02-02 NOTE — Progress Notes (Signed)
  Echocardiogram 2D Echocardiogram has been performed.  Celene Skeen 02/02/2020, 12:02 PM

## 2020-02-03 ENCOUNTER — Inpatient Hospital Stay (HOSPITAL_COMMUNITY): Payer: Non-veteran care | Admitting: Anesthesiology

## 2020-02-03 ENCOUNTER — Encounter (HOSPITAL_COMMUNITY)
Admission: EM | Disposition: A | Discharge status: Skilled Nursing Facility | Payer: Self-pay | Source: Home / Self Care | Attending: Internal Medicine

## 2020-02-03 DIAGNOSIS — N390 Urinary tract infection, site not specified: Secondary | ICD-10-CM

## 2020-02-03 DIAGNOSIS — Z89421 Acquired absence of other right toe(s): Secondary | ICD-10-CM

## 2020-02-03 HISTORY — PX: I & D EXTREMITY: SHX5045

## 2020-02-03 HISTORY — PX: AMPUTATION: SHX166

## 2020-02-03 LAB — CBC WITH DIFFERENTIAL/PLATELET
Abs Immature Granulocytes: 0.11 10*3/uL — ABNORMAL HIGH (ref 0.00–0.07)
Basophils Absolute: 0.1 10*3/uL (ref 0.0–0.1)
Basophils Relative: 0 %
Eosinophils Absolute: 0 10*3/uL (ref 0.0–0.5)
Eosinophils Relative: 0 %
HCT: 27.5 % — ABNORMAL LOW (ref 39.0–52.0)
Hemoglobin: 9.2 g/dL — ABNORMAL LOW (ref 13.0–17.0)
Immature Granulocytes: 1 %
Lymphocytes Relative: 9 %
Lymphs Abs: 1.5 10*3/uL (ref 0.7–4.0)
MCH: 22.5 pg — ABNORMAL LOW (ref 26.0–34.0)
MCHC: 33.5 g/dL (ref 30.0–36.0)
MCV: 67.4 fL — ABNORMAL LOW (ref 80.0–100.0)
Monocytes Absolute: 1.5 10*3/uL — ABNORMAL HIGH (ref 0.1–1.0)
Monocytes Relative: 9 %
Neutro Abs: 12.8 10*3/uL — ABNORMAL HIGH (ref 1.7–7.7)
Neutrophils Relative %: 81 %
Platelets: 314 10*3/uL (ref 150–400)
RBC: 4.08 MIL/uL — ABNORMAL LOW (ref 4.22–5.81)
RDW: 14.6 % (ref 11.5–15.5)
WBC: 16 10*3/uL — ABNORMAL HIGH (ref 4.0–10.5)
nRBC: 0 % (ref 0.0–0.2)

## 2020-02-03 LAB — URINE CULTURE: Culture: 50000 — AB

## 2020-02-03 LAB — CK: Total CK: 62 U/L (ref 49–397)

## 2020-02-03 LAB — COMPREHENSIVE METABOLIC PANEL
ALT: 41 U/L (ref 0–44)
AST: 43 U/L — ABNORMAL HIGH (ref 15–41)
Albumin: 1.6 g/dL — ABNORMAL LOW (ref 3.5–5.0)
Alkaline Phosphatase: 258 U/L — ABNORMAL HIGH (ref 38–126)
Anion gap: 10 (ref 5–15)
BUN: 31 mg/dL — ABNORMAL HIGH (ref 6–20)
CO2: 21 mmol/L — ABNORMAL LOW (ref 22–32)
Calcium: 7.8 mg/dL — ABNORMAL LOW (ref 8.9–10.3)
Chloride: 93 mmol/L — ABNORMAL LOW (ref 98–111)
Creatinine, Ser: 1.57 mg/dL — ABNORMAL HIGH (ref 0.61–1.24)
GFR calc Af Amer: 56 mL/min — ABNORMAL LOW (ref >60)
GFR calc non Af Amer: 48 mL/min — ABNORMAL LOW (ref >60)
Glucose, Bld: 225 mg/dL — ABNORMAL HIGH (ref 70–99)
Potassium: 4.1 mmol/L (ref 3.5–5.1)
Sodium: 124 mmol/L — ABNORMAL LOW (ref 135–145)
Total Bilirubin: 0.5 mg/dL (ref 0.3–1.2)
Total Protein: 6.1 g/dL — ABNORMAL LOW (ref 6.5–8.1)

## 2020-02-03 LAB — SURGICAL PCR SCREEN
MRSA, PCR: NEGATIVE
Staphylococcus aureus: POSITIVE — AB

## 2020-02-03 LAB — GLUCOSE, CAPILLARY
Glucose-Capillary: 359 mg/dL — ABNORMAL HIGH (ref 70–99)
Glucose-Capillary: 247 mg/dL — ABNORMAL HIGH (ref 70–99)
Glucose-Capillary: 303 mg/dL — ABNORMAL HIGH (ref 70–99)
Glucose-Capillary: 207 mg/dL — ABNORMAL HIGH (ref 70–99)
Glucose-Capillary: 181 mg/dL — ABNORMAL HIGH (ref 70–99)
Glucose-Capillary: 209 mg/dL — ABNORMAL HIGH (ref 70–99)

## 2020-02-03 LAB — RETICULOCYTES
Immature Retic Fract: 11.2 % (ref 2.3–15.9)
RBC.: 3.97 MIL/uL — ABNORMAL LOW (ref 4.22–5.81)
Retic Count, Absolute: 25.8 10*3/uL (ref 19.0–186.0)
Retic Ct Pct: 0.7 % (ref 0.4–3.1)

## 2020-02-03 LAB — C-REACTIVE PROTEIN: CRP: 13.2 mg/dL — ABNORMAL HIGH (ref <1.0)

## 2020-02-03 LAB — SEDIMENTATION RATE: Sed Rate: 94 mm/hr — ABNORMAL HIGH (ref 0–16)

## 2020-02-03 LAB — LIPASE, BLOOD: Lipase: 30 U/L (ref 11–51)

## 2020-02-03 LAB — LACTIC ACID, PLASMA: Lactic Acid, Venous: 0.8 mmol/L (ref 0.5–1.9)

## 2020-02-03 LAB — MAGNESIUM: Magnesium: 1.5 mg/dL — ABNORMAL LOW (ref 1.7–2.4)

## 2020-02-03 SURGERY — AMPUTATION, FOOT, RAY
Anesthesia: General | Laterality: Right

## 2020-02-03 MED ORDER — FENTANYL CITRATE (PF) 250 MCG/5ML IJ SOLN
INTRAMUSCULAR | Status: AC
  Filled 2020-02-03: qty 5

## 2020-02-03 MED ORDER — MIDAZOLAM HCL 2 MG/2ML IJ SOLN: 0.5000 mg | Freq: Once | INTRAMUSCULAR | Status: DC | PRN

## 2020-02-03 MED ORDER — MAGNESIUM CITRATE PO SOLN: 1.0000 | Freq: Once | ORAL | Status: DC | PRN

## 2020-02-03 MED ORDER — MAGNESIUM SULFATE 2 GM/50ML IV SOLN
2.0000 g | Freq: Once | INTRAVENOUS | Status: AC
  Administered 2020-02-03: 2 g via INTRAVENOUS
  Filled 2020-02-03: qty 50

## 2020-02-03 MED ORDER — 0.9 % SODIUM CHLORIDE (POUR BTL) OPTIME
TOPICAL | Status: DC | PRN
  Administered 2020-02-03: 1000 mL

## 2020-02-03 MED ORDER — ONDANSETRON HCL 4 MG PO TABS: 4.0000 mg | ORAL_TABLET | Freq: Four times a day (QID) | ORAL | Status: DC | PRN

## 2020-02-03 MED ORDER — ACETAMINOPHEN 325 MG PO TABS: 325.0000 mg | ORAL_TABLET | Freq: Four times a day (QID) | ORAL | Status: DC | PRN

## 2020-02-03 MED ORDER — BISACODYL 10 MG RE SUPP: 10.0000 mg | Freq: Every day | RECTAL | Status: DC | PRN

## 2020-02-03 MED ORDER — SODIUM CHLORIDE 0.9 % IR SOLN: Status: DC | PRN

## 2020-02-03 MED ORDER — FENTANYL CITRATE (PF) 100 MCG/2ML IJ SOLN: 25.0000 ug | INTRAMUSCULAR | Status: DC | PRN

## 2020-02-03 MED ORDER — VITAMIN B-12 1000 MCG PO TABS
1000.0000 ug | ORAL_TABLET | Freq: Every day | ORAL | Status: DC
  Administered 2020-02-03 – 2020-03-01 (×28): 1000 ug via ORAL
  Filled 2020-02-03 (×28): qty 1

## 2020-02-03 MED ORDER — MUPIROCIN 2 % EX OINT
1.0000 "application " | TOPICAL_OINTMENT | Freq: Two times a day (BID) | CUTANEOUS | Status: AC
  Administered 2020-02-03 – 2020-02-07 (×8): 1 via NASAL
  Filled 2020-02-03 (×3): qty 22

## 2020-02-03 MED ORDER — MIDAZOLAM HCL 2 MG/2ML IJ SOLN
INTRAMUSCULAR | Status: DC | PRN
  Administered 2020-02-03: 2 mg via INTRAVENOUS

## 2020-02-03 MED ORDER — SENNOSIDES-DOCUSATE SODIUM 8.6-50 MG PO TABS
1.0000 | ORAL_TABLET | Freq: Two times a day (BID) | ORAL | Status: DC
  Administered 2020-02-03 – 2020-02-09 (×11): 1 via ORAL
  Filled 2020-02-03 (×15): qty 1

## 2020-02-03 MED ORDER — PHENYLEPHRINE 40 MCG/ML (10ML) SYRINGE FOR IV PUSH (FOR BLOOD PRESSURE SUPPORT)
PREFILLED_SYRINGE | INTRAVENOUS | Status: DC | PRN
  Administered 2020-02-03: 120 ug via INTRAVENOUS
  Administered 2020-02-03: 160 ug via INTRAVENOUS
  Administered 2020-02-03: 120 ug via INTRAVENOUS

## 2020-02-03 MED ORDER — METOCLOPRAMIDE HCL 5 MG PO TABS: 5.0000 mg | ORAL_TABLET | Freq: Three times a day (TID) | ORAL | Status: DC | PRN

## 2020-02-03 MED ORDER — PHENYLEPHRINE HCL-NACL 10-0.9 MG/250ML-% IV SOLN
INTRAVENOUS | Status: DC | PRN
  Administered 2020-02-03: 50 ug/min via INTRAVENOUS

## 2020-02-03 MED ORDER — CEFAZOLIN SODIUM-DEXTROSE 1-4 GM/50ML-% IV SOLN: 1.0000 g | Freq: Four times a day (QID) | INTRAVENOUS | Status: DC

## 2020-02-03 MED ORDER — POLYETHYLENE GLYCOL 3350 17 G PO PACK: 17.0000 g | PACK | Freq: Every day | ORAL | Status: DC | PRN

## 2020-02-03 MED ORDER — LIDOCAINE 2% (20 MG/ML) 5 ML SYRINGE
INTRAMUSCULAR | Status: DC | PRN
  Administered 2020-02-03: 40 mg via INTRAVENOUS

## 2020-02-03 MED ORDER — ALBUMIN HUMAN 5 % IV SOLN: INTRAVENOUS | Status: DC | PRN

## 2020-02-03 MED ORDER — PROMETHAZINE HCL 25 MG/ML IJ SOLN: 6.2500 mg | INTRAMUSCULAR | Status: DC | PRN

## 2020-02-03 MED ORDER — SODIUM CHLORIDE 0.9 % IV SOLN: INTRAVENOUS | Status: DC

## 2020-02-03 MED ORDER — METHOCARBAMOL 1000 MG/10ML IJ SOLN
500.0000 mg | Freq: Four times a day (QID) | INTRAVENOUS | Status: DC | PRN
  Filled 2020-02-03: qty 5

## 2020-02-03 MED ORDER — OXYCODONE HCL 5 MG PO TABS
10.0000 mg | ORAL_TABLET | ORAL | Status: DC | PRN
  Filled 2020-02-03: qty 2

## 2020-02-03 MED ORDER — PROPOFOL 10 MG/ML IV BOLUS
INTRAVENOUS | Status: DC | PRN
  Administered 2020-02-03: 200 mg via INTRAVENOUS

## 2020-02-03 MED ORDER — METHOCARBAMOL 500 MG PO TABS: 500.0000 mg | ORAL_TABLET | Freq: Four times a day (QID) | ORAL | Status: DC | PRN

## 2020-02-03 MED ORDER — PROPOFOL 10 MG/ML IV BOLUS
INTRAVENOUS | Status: AC
  Filled 2020-02-03: qty 20

## 2020-02-03 MED ORDER — MEPERIDINE HCL 25 MG/ML IJ SOLN: 6.2500 mg | INTRAMUSCULAR | Status: DC | PRN

## 2020-02-03 MED ORDER — FENTANYL CITRATE (PF) 250 MCG/5ML IJ SOLN
INTRAMUSCULAR | Status: DC | PRN
  Administered 2020-02-03: 50 ug via INTRAVENOUS
  Administered 2020-02-03 (×3): 25 ug via INTRAVENOUS
  Administered 2020-02-03: 50 ug via INTRAVENOUS

## 2020-02-03 MED ORDER — MIDAZOLAM HCL 2 MG/2ML IJ SOLN
INTRAMUSCULAR | Status: AC
  Filled 2020-02-03: qty 2

## 2020-02-03 MED ORDER — HYDROMORPHONE HCL 1 MG/ML IJ SOLN: 0.5000 mg | INTRAMUSCULAR | Status: DC | PRN

## 2020-02-03 MED ORDER — OXYCODONE HCL 5 MG PO TABS
5.0000 mg | ORAL_TABLET | ORAL | Status: DC | PRN
  Administered 2020-02-03 – 2020-02-04 (×2): 5 mg via ORAL
  Filled 2020-02-03: qty 1

## 2020-02-03 MED ORDER — ONDANSETRON HCL 4 MG/2ML IJ SOLN: 4.0000 mg | Freq: Four times a day (QID) | INTRAMUSCULAR | Status: DC | PRN

## 2020-02-03 MED ORDER — METOCLOPRAMIDE HCL 5 MG/ML IJ SOLN
5.0000 mg | Freq: Three times a day (TID) | INTRAMUSCULAR | Status: DC | PRN
  Filled 2020-02-03: qty 2

## 2020-02-03 MED ORDER — DOCUSATE SODIUM 100 MG PO CAPS: 100.0000 mg | ORAL_CAPSULE | Freq: Two times a day (BID) | ORAL | Status: DC

## 2020-02-03 SURGICAL SUPPLY — 38 items
BLADE SAW SGTL MED 73X18.5 STR (BLADE) IMPLANT
BLADE SURG 21 STRL SS (BLADE) ×4 IMPLANT
BNDG COHESIVE 4X5 TAN STRL (GAUZE/BANDAGES/DRESSINGS) ×4 IMPLANT
BNDG COHESIVE 6X5 TAN STRL LF (GAUZE/BANDAGES/DRESSINGS) IMPLANT
BNDG GAUZE ELAST 4 BULKY (GAUZE/BANDAGES/DRESSINGS) ×8 IMPLANT
COVER SURGICAL LIGHT HANDLE (MISCELLANEOUS) ×8 IMPLANT
COVER WAND RF STERILE (DRAPES) ×4 IMPLANT
DRAPE U-SHAPE 47X51 STRL (DRAPES) ×8 IMPLANT
DRSG ADAPTIC 3X8 NADH LF (GAUZE/BANDAGES/DRESSINGS) ×4 IMPLANT
DRSG MEPILEX BORDER 4X4 (GAUZE/BANDAGES/DRESSINGS) ×2 IMPLANT
DRSG PAD ABDOMINAL 8X10 ST (GAUZE/BANDAGES/DRESSINGS) ×8 IMPLANT
DURAPREP 26ML APPLICATOR (WOUND CARE) ×4 IMPLANT
ELECT REM PT RETURN 9FT ADLT (ELECTROSURGICAL) ×4
ELECTRODE REM PT RTRN 9FT ADLT (ELECTROSURGICAL) ×2 IMPLANT
GAUZE SPONGE 4X4 12PLY STRL (GAUZE/BANDAGES/DRESSINGS) ×4 IMPLANT
GLOVE BIOGEL PI IND STRL 9 (GLOVE) ×2 IMPLANT
GLOVE BIOGEL PI INDICATOR 9 (GLOVE) ×2
GLOVE SURG ORTHO 9.0 STRL STRW (GLOVE) ×4 IMPLANT
GOWN STRL REUS W/ TWL XL LVL3 (GOWN DISPOSABLE) ×4 IMPLANT
GOWN STRL REUS W/TWL XL LVL3 (GOWN DISPOSABLE) ×8
HANDPIECE INTERPULSE COAX TIP (DISPOSABLE)
KIT BASIN OR (CUSTOM PROCEDURE TRAY) ×4 IMPLANT
KIT DRSG PREVENA PLUS 7DAY 125 (MISCELLANEOUS) ×2 IMPLANT
KIT TURNOVER KIT B (KITS) ×4 IMPLANT
MANIFOLD NEPTUNE II (INSTRUMENTS) ×4 IMPLANT
NS IRRIG 1000ML POUR BTL (IV SOLUTION) ×4 IMPLANT
PACK ORTHO EXTREMITY (CUSTOM PROCEDURE TRAY) ×4 IMPLANT
PAD ARMBOARD 7.5X6 YLW CONV (MISCELLANEOUS) ×8 IMPLANT
SET HNDPC FAN SPRY TIP SCT (DISPOSABLE) IMPLANT
STOCKINETTE IMPERVIOUS 9X36 MD (GAUZE/BANDAGES/DRESSINGS) IMPLANT
STOCKINETTE IMPERVIOUS LG (DRAPES) IMPLANT
SUT ETHILON 2 0 PSLX (SUTURE) ×8 IMPLANT
SWAB COLLECTION DEVICE MRSA (MISCELLANEOUS) ×2 IMPLANT
SWAB CULTURE ESWAB REG 1ML (MISCELLANEOUS) IMPLANT
TOWEL GREEN STERILE (TOWEL DISPOSABLE) ×4 IMPLANT
TUBE CONNECTING 12'X1/4 (SUCTIONS) ×1
TUBE CONNECTING 12X1/4 (SUCTIONS) ×3 IMPLANT
YANKAUER SUCT BULB TIP NO VENT (SUCTIONS) ×4 IMPLANT

## 2020-02-03 NOTE — Anesthesia Postprocedure Evaluation (Signed)
Anesthesia Post Note  Patient: Chris Bowman  Procedure(s) Performed: AMPUTATION RAY, transmetatarsal of right foot (Right ) IRRIGATION AND DEBRIDEMENT EXTREMITY, left great toe (Left )     Patient location during evaluation: PACU Anesthesia Type: General Level of consciousness: awake and alert, patient cooperative and oriented Pain management: pain level controlled Vital Signs Assessment: post-procedure vital signs reviewed and stable Respiratory status: spontaneous breathing, nonlabored ventilation, respiratory function stable and patient connected to nasal cannula oxygen Cardiovascular status: blood pressure returned to baseline and stable Postop Assessment: no apparent nausea or vomiting Anesthetic complications: no    Last Vitals:  Vitals:   02/03/20 1100 02/03/20 1115  BP: 113/60 117/61  Pulse: 85 85  Resp: 16 16  Temp:  36.8 C  SpO2: 99% 100%    Last Pain:  Vitals:   02/03/20 1115  TempSrc:   PainSc: 0-No pain                 Willy Vorce,E. Jaiah Weigel

## 2020-02-03 NOTE — Op Note (Signed)
02/03/2020  10:38 AM  PATIENT:  Chris Bowman    PRE-OPERATIVE DIAGNOSIS:  osteomyelitis, right foot, ulcer left great toe  POST-OPERATIVE DIAGNOSIS:  Same  PROCEDURE:  AMPUTATION RAY, transmetatarsal of right foot, IRRIGATION AND DEBRIDEMENT EXTREMITY, left great toe  SURGEON:  Nadara Mustard, MD  PHYSICIAN ASSISTANT:None ANESTHESIA:   General  PREOPERATIVE INDICATIONS:  [name] is a  59 y.o. male with a diagnosis of osteomyelitis, right foot who failed conservative measures and elected for surgical management.    The risks benefits and alternatives were discussed with the patient preoperatively including but not limited to the risks of infection, bleeding, nerve injury, cardiopulmonary complications, the need for revision surgery, among others, and the patient was willing to proceed.  OPERATIVE IMPLANTS: 13 cm Prevena wound VAC  @ENCIMAGES @  OPERATIVE FINDINGS: Calcified vessels good petechial bleeding at the amputation site no signs of infection  OPERATIVE PROCEDURE: Patient was brought the operating room and underwent a general anesthetic.  After adequate levels anesthesia were obtained patient's right lower extremity was prepped using DuraPrep draped into a sterile field a timeout was called.  A fishmouth incision was made just proximal to the ulcerative necrotic tissue.  This was carried sharply down to bone a oscillating saw was used perform a transmetatarsal amputation with the bone beveled plantarly.  Electrocautery was used for hemostasis wound was irrigated with normal saline.  2-0 nylon was used for wound closure.  A Prevena wound VAC was applied 13 cm this had a good suction fit.  Attention was then focused on the left lower extremity.  A 21 blade knife was used to breed the skin and soft tissue from the great toe ulcer.  This left a wound that was 3 cm in diameter 3 mm deep.  There was a small amount of purulence however the base of the wound had good healthy granulation  tissue did not probe to bone or tendon no clinical signs of underlying infection.  A Mepilex dressing was applied patient was extubated taken the PACU in stable condition.   DISCHARGE PLANNING:  Antibiotic duration: Continue antibiotics for 24 hours  Weightbearing: Touchdown weightbearing on the right full weightbearing on the left  Pain medication: Opioid pathway ordered  Dressing care/ Wound VAC: Continue wound VAC for 1 week discharge with the Praveena portable wound VAC pump  Ambulatory devices: Walker  Discharge to: Anticipate discharge to home  Follow-up: In the office 1 week post operative.

## 2020-02-03 NOTE — Anesthesia Preprocedure Evaluation (Addendum)
Anesthesia Evaluation  Patient identified by MRN, date of birth, ID band Patient awake    Reviewed: Allergy & Precautions, NPO status , Patient's Chart, lab work & pertinent test results  History of Anesthesia Complications Negative for: history of anesthetic complications  Airway Mallampati: II  TM Distance: >3 FB Neck ROM: Full    Dental  (+) Poor Dentition, Chipped, Missing, Dental Advisory Given   Pulmonary shortness of breath,  02/01/2020 SARS coronavirus NEG   breath sounds clear to auscultation       Cardiovascular hypertension, (-) angina Rhythm:Regular Rate:Normal  02/02/2020 ECHO: EF 65-70%, valves OK   Neuro/Psych Peripheral neuropathy    GI/Hepatic negative GI ROS, Neg liver ROS,   Endo/Other  diabetes (glu 303)  Renal/GU Renal InsufficiencyRenal disease (creat 1.57)     Musculoskeletal   Abdominal   Peds  Hematology  (+) Blood dyscrasia (Hb 9.2), anemia ,   Anesthesia Other Findings   Reproductive/Obstetrics                            Anesthesia Physical Anesthesia Plan  ASA: III  Anesthesia Plan: General   Post-op Pain Management:    Induction: Intravenous  PONV Risk Score and Plan: 2 and Ondansetron and Dexamethasone  Airway Management Planned: LMA  Additional Equipment:   Intra-op Plan:   Post-operative Plan:   Informed Consent: I have reviewed the patients History and Physical, chart, labs and discussed the procedure including the risks, benefits and alternatives for the proposed anesthesia with the patient or authorized representative who has indicated his/her understanding and acceptance.     Dental advisory given  Plan Discussed with: CRNA and Surgeon  Anesthesia Plan Comments:        Anesthesia Quick Evaluation

## 2020-02-03 NOTE — Progress Notes (Signed)
Orthopedic Tech Progress Note Patient Details:  Chris Bowman 1961/10/13 [id number]  Ortho Devices Type of Ortho Device: Postop shoe/boot Ortho Device/Splint Interventions: Application   Post Interventions Patient Tolerated: Well Instructions Provided: Care of device   Saul Fordyce 02/03/2020, 3:46 PM

## 2020-02-03 NOTE — Progress Notes (Signed)
[facility]    Date of Admission:  02/01/2020   Total days of antibiotics 3           ID: Chris Bowman is a 59 y.o. male with   Principal Problem:   Sepsis due to undetermined organism 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  Blood cx -MSSA and urine CX - MSSA. Had TTE yesterday not showing any vegetations on imaging  Medications:  . Chlorhexidine Gluconate Cloth  6 each Topical Daily  . docusate sodium  100 mg Oral BID  . [START ON 02/05/2020] enoxaparin (LOVENOX) injection  40 mg Subcutaneous Q24H  . feeding supplement (GLUCERNA SHAKE)  237 mL Oral TID BM  . insulin aspart  0-15 Units Subcutaneous TID WC  . insulin aspart  0-5 Units Subcutaneous QHS  . insulin detemir  6 Units Subcutaneous BID  . multivitamin with minerals  1 tablet Oral Daily  . mupirocin ointment  1 application Nasal BID  . sodium chloride flush  3 mL Intravenous Q12H    Objective: Vital signs in last 24 hours: Temp:  [97.7 F (36.5 C)-101.3 F (38.5 C)] 98.2 F (36.8 C) (03/06 1115) Pulse Rate:  [85-97] 85 (03/06 1115) Resp:  [14-18] 16 (03/06 1115) BP: (100-153)/(58-74) 117/61 (03/06 1115) SpO2:  [95 %-100 %] 100 % (03/06 1115) Physical Exam  Constitutional: He is oriented to person, place, and time. He appears well-developed and well-nourished. No distress.  HENT:  Mouth/Throat: Oropharynx is clear and moist. No oropharyngeal exudate.  Cardiovascular: Normal rate, regular rhythm and normal heart sounds. Exam reveals no gallop and no friction rub.  No murmur heard.  Pulmonary/Chest: Effort normal and breath sounds normal. No  respiratory distress. He has no wheezes.  Abdominal: Soft. Bowel sounds are normal. He exhibits no distension. There is no tenderness.  OIN:OMVEH foot and left foot bandaged   Lab Results Recent Labs    02/02/20 0734 02/03/20 0323  WBC 18.4* 16.0*  HGB 9.8* 9.2*  HCT 30.2* 27.5*  NA 121* 124*  K 4.0 4.1  CL 90* 93*  CO2 19* 21*  BUN 34* 31*  CREATININE 1.68* 1.57*   Liver Panel Recent Labs    02/01/20 0805 02/03/20 0323  PROT 8.2* 6.1*  ALBUMIN 2.3* 1.6*  AST 75* 43*  ALT 109* 41  ALKPHOS 317* 258*  BILITOT 1.2 0.5  BILIDIR 0.3*  --   IBILI 0.9  --    Sedimentation Rate Recent Labs    02/03/20 0323  ESRSEDRATE 94*   C-Reactive Protein Recent Labs    02/01/20 2354 02/03/20 0323  CRP 14.3* 13.2*    Microbiology: 3/4 blood cx MSSA 3/4 urine cx  IN/OUT CATH URINE    Special Requests NONE  Performed at [facility], [address] [place], [place] [address]   Culture 50,000 COLONIES/mL STAPHYLOCOCCUS AUREUSAbnormal    Report Status 02/03/2020 FINAL   Organism ID, Bacteria STAPHYLOCOCCUS AUREUSAbnormal    Resulting Agency [facility]  Susceptibility   Staphylococcus aureus    MIC    CIPROFLOXACIN <=0.5 SENSI... Sensitive    CLINDAMYCIN <=0.25  SENS... Sensitive    GENTAMICIN <=0.5 SENSI... Sensitive    Inducible Clindamycin NEGATIVE  Sensitive    NITROFURANTOIN <=16 SENSIT... Sensitive    OXACILLIN <=0.25 SENS... Sensitive    RIFAMPIN <=0.5 SENSI... Sensitive       Studies/Results: DG Shoulder Right  Result Date: 02/01/2020 CLINICAL DATA:  59 year old male with fall and right shoulder pain. EXAM: RIGHT SHOULDER - 2+ VIEW COMPARISON:  Chest radiograph dated 02/01/2020. FINDINGS: There is no acute fracture or dislocation. No significant arthritic changes. Mild elevation of the right humeral head may represent chronic rotator cuff injury. There is degenerative changes of the right AC joint. The soft tissues are unremarkable. IMPRESSION: No acute  fracture or dislocation. Electronically Signed   By: Elgie CollardArash  Radparvar M.D.   On: 02/01/2020 19:17   MR BRAIN WO CONTRAST  Result Date: 02/01/2020 CLINICAL DATA:  Generalized weakness, gait instability, and multiple falls. EXAM: MRI HEAD WITHOUT CONTRAST MRI CERVICAL SPINE WITHOUT CONTRAST TECHNIQUE: Multiplanar, multiecho pulse sequences of the brain and surrounding structures, and cervical spine, to include the craniocervical junction and cervicothoracic junction, were obtained without intravenous contrast. COMPARISON:  None. FINDINGS: MRI HEAD FINDINGS The study is mildly motion degraded. Brain: There is no evidence of acute infarct, intracranial hemorrhage, mass, midline shift, or extra-axial fluid collection. There is mild cerebral atrophy. Periventricular white matter T2 hyperintensities are nonspecific but compatible with minimal chronic small vessel ischemic disease. Vascular: Major intracranial vascular flow voids are preserved. Skull and upper cervical spine: No suspicious marrow lesion. Sinuses/Orbits: Unremarkable orbits. Minimal left ethmoid air cell mucosal thickening. At most trace right mastoid fluid. Other: None. MRI CERVICAL SPINE FINDINGS The study is motion degraded throughout including severe motion on the axial sequences. Alignment: No significant listhesis. Vertebrae: No fracture or suspicious marrow lesion. Chronic degenerative endplate changes at C6-7 associated with severe disc space narrowing. Moderate disc space narrowing at C4-5. No discal fluid signal or frank vertebral marrow edema to indicate discitis or osteomyelitis. Small right facet joint effusion at C5-6 without substantial facet marrow edema. Cord: No cord signal abnormality identified within limitations of motion artifact. Posterior Fossa, vertebral arteries, paraspinal tissues: There is prevertebral edema, and there are apparent fluid collections along the right anterolateral aspects of the C3-C5 vertebral bodies measuring  up to approximately 4 cm in craniocaudal length and with a maximal transverse diameter of approximately 1.3 cm. There is a diffusely edematous appearance of the right scalene musculature more inferiorly in the neck. The vertebral artery flow voids are grossly preserved bilaterally. Disc levels: A small ventral epidural fluid collection is suspected asymmetrically to the right of midline extending from the C3 inferior to C5 superior endplate levels (for example series 5, image 7 and series 10, image 22). This measures 2-2.5 cm in craniocaudal length and less than 1 cm in transverse dimension and contributes to mild spinal stenosis at these levels without frank cord compression. Detailed assessment of degenerative changes is limited by motion, however there is no significant spinal stenosis elsewhere. Disc bulging and uncovertebral spurring result in neural foraminal stenosis which is likely moderate bilaterally at C3-4, severe on the right and moderate to severe on the left at C4-5, mild on the right at C5-6, and moderate on the right at C6-7. IMPRESSION: MRI HEAD: 1. No acute intracranial abnormality. 2. Mild cerebral atrophy and chronic small vessel ischemia. MRI CERVICAL SPINE: 1. Severely motion degraded examination. 2. Prevertebral edema with right-sided paravertebral fluid collections in the mid to lower cervical spine concerning for infection  and abscesses with suspected myositis involving the right-sided scalene musculature. 3. Suspected small ventral epidural abscess or phlegmon from C3-C5 with mild spinal stenosis. No cord compression. 4. No definite evidence of discitis or osteomyelitis. 5. Small volume facet joint fluid on the right at C5-6 is favored to be degenerative although septic arthritis is not excluded. These results will be called to the ordering clinician or representative by the Radiologist Assistant, and communication documented in the PACS or zVision Dashboard. Electronically Signed   By:  Sebastian Ache M.D.   On: 02/01/2020 19:38   MR CERVICAL SPINE WO CONTRAST  Result Date: 02/01/2020 CLINICAL DATA:  Generalized weakness, gait instability, and multiple falls. EXAM: MRI HEAD WITHOUT CONTRAST MRI CERVICAL SPINE WITHOUT CONTRAST TECHNIQUE: Multiplanar, multiecho pulse sequences of the brain and surrounding structures, and cervical spine, to include the craniocervical junction and cervicothoracic junction, were obtained without intravenous contrast. COMPARISON:  None. FINDINGS: MRI HEAD FINDINGS The study is mildly motion degraded. Brain: There is no evidence of acute infarct, intracranial hemorrhage, mass, midline shift, or extra-axial fluid collection. There is mild cerebral atrophy. Periventricular white matter T2 hyperintensities are nonspecific but compatible with minimal chronic small vessel ischemic disease. Vascular: Major intracranial vascular flow voids are preserved. Skull and upper cervical spine: No suspicious marrow lesion. Sinuses/Orbits: Unremarkable orbits. Minimal left ethmoid air cell mucosal thickening. At most trace right mastoid fluid. Other: None. MRI CERVICAL SPINE FINDINGS The study is motion degraded throughout including severe motion on the axial sequences. Alignment: No significant listhesis. Vertebrae: No fracture or suspicious marrow lesion. Chronic degenerative endplate changes at C6-7 associated with severe disc space narrowing. Moderate disc space narrowing at C4-5. No discal fluid signal or frank vertebral marrow edema to indicate discitis or osteomyelitis. Small right facet joint effusion at C5-6 without substantial facet marrow edema. Cord: No cord signal abnormality identified within limitations of motion artifact. Posterior Fossa, vertebral arteries, paraspinal tissues: There is prevertebral edema, and there are apparent fluid collections along the right anterolateral aspects of the C3-C5 vertebral bodies measuring up to approximately 4 cm in craniocaudal length  and with a maximal transverse diameter of approximately 1.3 cm. There is a diffusely edematous appearance of the right scalene musculature more inferiorly in the neck. The vertebral artery flow voids are grossly preserved bilaterally. Disc levels: A small ventral epidural fluid collection is suspected asymmetrically to the right of midline extending from the C3 inferior to C5 superior endplate levels (for example series 5, image 7 and series 10, image 22). This measures 2-2.5 cm in craniocaudal length and less than 1 cm in transverse dimension and contributes to mild spinal stenosis at these levels without frank cord compression. Detailed assessment of degenerative changes is limited by motion, however there is no significant spinal stenosis elsewhere. Disc bulging and uncovertebral spurring result in neural foraminal stenosis which is likely moderate bilaterally at C3-4, severe on the right and moderate to severe on the left at C4-5, mild on the right at C5-6, and moderate on the right at C6-7. IMPRESSION: MRI HEAD: 1. No acute intracranial abnormality. 2. Mild cerebral atrophy and chronic small vessel ischemia. MRI CERVICAL SPINE: 1. Severely motion degraded examination. 2. Prevertebral edema with right-sided paravertebral fluid collections in the mid to lower cervical spine concerning for infection and abscesses with suspected myositis involving the right-sided scalene musculature. 3. Suspected small ventral epidural abscess or phlegmon from C3-C5 with mild spinal stenosis. No cord compression. 4. No definite evidence of discitis or osteomyelitis. 5. Small volume  facet joint fluid on the right at C5-6 is favored to be degenerative although septic arthritis is not excluded. These results will be called to the ordering clinician or representative by the Radiologist Assistant, and communication documented in the PACS or zVision Dashboard. Electronically Signed   By: Sebastian Ache M.D.   On: 02/01/2020 19:38   US  Abdomen Complete  Result Date: 02/02/2020 CLINICAL DATA:  Elevated liver function tests. Renal insufficiency with elevated creatinine. EXAM: ABDOMEN ULTRASOUND COMPLETE COMPARISON:  None. FINDINGS: Gallbladder: Several gallstones are seen, largest measuring 1.2 cm. Gallbladder is incompletely distended. No abnormal gallbladder wall thickening or pericholecystic fluid noted. No sonographic Murphy sign noted by sonographer. Common bile duct: Diameter: 3 mm, within normal limits. Liver: No focal lesion identified. Within normal limits in parenchymal echogenicity. Portal vein is patent on color Doppler imaging with normal direction of blood flow towards the liver. IVC: No abnormality visualized. Pancreas: Pancreas is not visualized due to overlying bowel gas. Spleen: Size and appearance within normal limits. Right Kidney: Length: 11.3 cm. Mildly increased parenchymal echogenicity noted. No mass or hydronephrosis visualized. Left Kidney: Length: 12.3 cm. Mildly increased parenchymal echogenicity noted. No mass or hydronephrosis visualized. Abdominal aorta: No aneurysm visualized, although distal abdominal aorta obscured by overlying bowel gas. Other findings: None. IMPRESSION: Cholelithiasis. No sonographic evidence of cholecystitis or biliary ductal dilatation. Mildly increased renal parenchymal echogenicity, consistent with medical renal disease. No evidence of hydronephrosis. Electronically Signed   By: Danae Orleans M.D.   On: 02/02/2020 16:48   MR FOOT RIGHT WO CONTRAST  Result Date: 02/01/2020 CLINICAL DATA:  Osteomyelitis EXAM: MRI OF THE RIGHT FOREFOOT WITHOUT CONTRAST TECHNIQUE: Multiplanar, multisequence MR imaging of the right was performed. No intravenous contrast was administered. COMPARISON:  Radiograph November 29, 2007 FINDINGS: Bones/Joint/Cartilage There is increased marrow signal seen through the fifth metatarsal head, proximal fifth phalanx, fourth metatarsal head, and proximal fourth phalanx with  associated T1 hypointensity. This could be due to early osteomyelitis. Increased marrow signal seen within the distal fourth and fifth phalanges, mid fifth metatarsal shaft, second and third proximal phalanges, likely due to reactive marrow. There is large cystic type lucency seen within the navicular as on prior exam. 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 There is also osseous coalition between the second metatarsal and intermediate cuneiform as well as the medial and lateral cuneiform is. 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 Ligaments The Lisfranc ligaments and collateral ligaments are intact. Muscles and Tendons Diffusely increased signal seen throughout the muscles, likely due to microvascular disease and denervation atrophy. The flexor and extensor tendons appear to be intact. The plantar fascia is intact. Soft tissues There is focal area of superficial ulceration seen on the plantar surface of the midfoot between the fourth and fifth metatarsal heads measuring 1.7 cm in transverse dimension, series 5, image 28. No definite loculated fluid collection or sinus tract is seen. There is mild dorsal soft tissue swelling. IMPRESSION: 1. Focal area of superficial ulceration on the plantar surface beneath the fourth and fifth metatarsal heads. For no sinus tract or abscess. Findings which could be suggestive of osteomyelitis involving the fourth and fifth metatarsal heads and proximal phalanges. 2. Probable reactive marrow within the distal fourth and fifth phalanges, mid fifth metatarsal shaft, and second and third proximal phalanges. 3. Osseous coalition as described above. Electronically  Signed   By: Jonna ClarkBindu  Avutu M.D.   On: 02/01/2020 22:12   MR FOOT LEFT WO CONTRAST  Result Date: 02/01/2020 CLINICAL DATA:  Osteomyelitis of the  foot EXAM: MRI OF THE LEFT FOOT WITHOUT CONTRAST TECHNIQUE: Multiplanar, multisequence MR imaging of the left was performed. No intravenous contrast was administered. COMPARISON:  None. FINDINGS: Bones/Joint/Cartilage There is question of a tiny focal area of early erosive type change seen on the plantar surface of the distal phalanx series 4, image 44. Increased marrow signal seen throughout the distal phalanx of the first digit and the proximal first phalanx at the interphalangeal joint. No definite area of cortical destruction is seen. There is interphalangeal joint and MTP joint osteoarthritis at the first digit with joint space loss and subchondral cystic changes. There is osseous coalition seen at the second metatarsal cuneiform joint and as well with the medial and intermediate cuneiform. No large joint effusion is noted. There is cystic changes seen at the talonavicular joint with joint space loss as well as at the naviculocuneiform joint. Ligaments The Lisfranc ligaments and collateral ligaments appear to be intact. Muscles and Tendons There is diffusely increased signal seen throughout the muscles, likely due to microvascular disease and denervation atrophy. The flexor and extensor tendons are intact. The plantar fascia is intact. Soft tissues There is focal area of skin irregularity/superficial ulceration seen on the plantar surface of the distal first digit with skin thickening. There is diffuse subcutaneous edema. No loculated fluid collection or sinus tract however is seen. There is mild dorsal soft tissue swelling. IMPRESSION: Subtle skin ulceration with probable findings of cellulitis involving the distal digit of the first toe. No sinus tract or abscess is seen. There is question of early osteomyelitis involving the plantar surface of the distal first phalanx with a focal area of erosive type change. Probable reactive marrow seen throughout the distal and proximal first phalanx. 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 Osteoarthritis as described above. Electronically Signed   By: Jonna ClarkBindu  Avutu M.D.   On: 02/01/2020 22:06   VAS US ABI WITH/WO TBI  Result Date: 02/02/2020 LOWER EXTREMITY DOPPLER STUDY Indications: Ulceration, and bilateral foot ulcers. High Risk Factors: Hypertension, Diabetes.  Comparison Study: no prior Performing Technologist: Blanch MediaMegan Riddle RVS  Examination Guidelines: A complete evaluation includes at minimum, Doppler waveform signals and systolic blood pressure reading at the level of bilateral brachial, anterior tibial, and posterior tibial arteries, when vessel segments are accessible. Bilateral testing is considered an integral part of a complete examination. Photoelectric Plethysmograph (PPG) waveforms and toe systolic pressure readings are included as required and additional duplex testing as needed. Limited examinations for reoccurring indications may be performed as noted.  ABI Findings: +---------+------------------+-----+---------+--------+ Right    Rt Pressure (mmHg)IndexWaveform Comment  +---------+------------------+-----+---------+--------+ Brachial 1434                   triphasic         +---------+------------------+-----+---------+--------+ PTA      117               0.82 triphasic         +---------+------------------+-----+---------+--------+ DP       144               1.01 triphasic         +---------+------------------+-----+---------+--------+ Great Toe68                0.05                   +---------+------------------+-----+---------+--------+ +---------+------------------+-----+---------+-------+  Left     Lt Pressure (mmHg)IndexWaveform Comment +---------+------------------+-----+---------+-------+ Brachial 134                    triphasic        +---------+------------------+-----+---------+-------+ PTA      123               0.86 triphasic         +---------+------------------+-----+---------+-------+ DP       127               0.89 triphasic        +---------+------------------+-----+---------+-------+ Great Toe68                0.05                  +---------+------------------+-----+---------+-------+ +-------+-----------+-----------+------------+------------+ ABI/TBIToday's ABIToday's TBIPrevious ABIPrevious TBI +-------+-----------+-----------+------------+------------+ Right  1.10                                           +-------+-----------+-----------+------------+------------+ Left   0.89                                           +-------+-----------+-----------+------------+------------+  Summary: Right: Resting right ankle-brachial index is within normal range. No evidence of significant right lower extremity arterial disease. The right toe-brachial index is abnormal. Left: Resting left ankle-brachial index indicates mild left lower extremity arterial disease. The left toe-brachial index is abnormal.  *See table(s) above for measurements and observations.  Electronically signed by Monica Martinez MD on 02/02/2020 at 4:35:41 PM.    Final    ECHOCARDIOGRAM COMPLETE  Result Date: 02/02/2020    ECHOCARDIOGRAM REPORT   Patient Name:   [facility] Arel Date of Exam: 02/02/2020 Medical Rec #:  [id number]     Height: Accession #:    [id number]    Weight: Date of Birth:  1961/09/27    BSA: Patient Age:    52 years      BP:           122/63 mmHg Patient Gender: M             HR:           97 bpm. Exam Location:  Inpatient Procedure: 2D Echo Indications:    bacteremia 790.7  History:        Patient has no prior history of Echocardiogram examinations.                 Risk Factors:Diabetes and Hypertension.  Sonographer:    Jannett Celestine RDCS (AE) Referring Phys: [id number] [place]  1. Left ventricular ejection fraction, by estimation, is 65 to 70%. The left ventricle has normal function. The left ventricle has no  regional wall motion abnormalities. There is mild left ventricular hypertrophy. Left ventricular diastolic parameters are consistent with Grade II diastolic dysfunction (pseudonormalization).  2. Right ventricular systolic function is normal. The right ventricular size is normal.  3. Left atrial size was moderately dilated.  4. The mitral valve is normal in structure and function. Trivial mitral valve regurgitation.  5. The aortic valve is tricuspid. Aortic valve regurgitation is not visualized.  6. The inferior vena cava is dilated in size with >50% respiratory variability, suggesting right atrial pressure of 8 mmHg.  7.  Technically limited sutdy due to poor acoustic windows. If suspicion is high for endocarditis would consider TEE to further evalaute. FINDINGS  Left Ventricle: Left ventricular ejection fraction, by estimation, is 65 to 70%. The left ventricle has normal function. The left ventricle has no regional wall motion abnormalities. The left ventricular internal cavity size was normal in size. There is  mild left ventricular hypertrophy. Left ventricular diastolic parameters are consistent with Grade II diastolic dysfunction (pseudonormalization). Right Ventricle: The right ventricular size is normal. No increase in right ventricular wall thickness. Right ventricular systolic function is normal. Left Atrium: Left atrial size was moderately dilated. Right Atrium: Right atrial size was normal in size. Pericardium: There is no evidence of pericardial effusion. Mitral Valve: The mitral valve is normal in structure and function. Trivial mitral valve regurgitation. Tricuspid Valve: The tricuspid valve is normal in structure. Tricuspid valve regurgitation is trivial. Aortic Valve: The aortic valve is tricuspid. Aortic valve regurgitation is not visualized. There is mild calcification of the aortic valve. Pulmonic Valve: The pulmonic valve was grossly normal. Pulmonic valve regurgitation is trivial. Aorta: The  aortic root and ascending aorta are structurally normal, with no evidence of dilitation. Venous: The inferior vena cava is dilated in size with greater than 50% respiratory variability, suggesting right atrial pressure of 8 mmHg. IAS/Shunts: No atrial level shunt detected by color flow Doppler.  LEFT VENTRICLE PLAX 2D LVIDd:         4.00 cm Diastology LV PW:         1.50 cm LV e' lateral:   6.85 cm/s LV IVS:        1.30 cm LV E/e' lateral: 15.5                        LV e' medial:    7.51 cm/s                        LV E/e' medial:  14.1  RIGHT VENTRICLE RV Basal diam:  4.19 cm RV S prime:     150.00 cm/s TAPSE (M-mode): 1.9 cm LEFT ATRIUM LA diam:      4.70 cm LA Vol (A2C): 11.5 ml LA Vol (A4C): 23.7 ml  AORTIC VALVE LVOT Vmax:   104.00 cm/s LVOT VTI:    19.100 m MITRAL VALVE MV PHT:      2.78 msec MV E velocity: 106.00 cm/s MV A velocity: 8900.00 cm/s MV E/A ratio:  0.1 Arvilla Meres MD Electronically signed by Arvilla Meres MD Signature Date/Time: 02/02/2020/7:00:59 PM    Final      Assessment/Plan: MSSA bacteremia thought to be secondary to diabetic foot ulcer/osteo s/p amputation for source control = continue on cefazolin  MSSA uti= present on admission thought to be reflection of disseminated infection with bacteremia. This is likely secondary to his bloodstream infection rather than ascending infection  Please arrange for TEE to help with length of course of treatment. If evidence of vegetation on TEE will need to do 6 wk course.  [facility] Cell: [phone] Pager: [phone]  02/03/2020, 12:52 PM

## 2020-02-03 NOTE — Progress Notes (Signed)
Called ortho tech for ortho shoe. Patient denies pain. Resting comfortably, call bell in reach.

## 2020-02-03 NOTE — Plan of Care (Signed)

## 2020-02-03 NOTE — Progress Notes (Signed)
Physical Therapy Treatment Patient Details Name: Gurinder Toral MRN: [id number] DOB: Apr 02, 1961 Today's Date: 02/03/2020    History of Present Illness 59 y.o. male with history of uncontrolled diabetes, essential hypertension, delusional disorders. bilateral feet wounds with early indication of osteomyelitis per MRI results from 3/4    PT Comments    Post op day 0 with pt limited now to TDWB on R foot in ortho shoe and LLE WBAT.  Pt is not having pain and does not have the ortho shoe yet.  Sat side of bed to do ROM on BLE's with balance being challenged by the effort.  Follow him to assess the type of AD that works best, but is still getting so little control of movement will still recommend SNF.  Pt is in bed with legs elevated and positioned to protect his skin.  Wound vac and cath lines are in position to drain, pt is comfortable with call bell in reach.  See acutely as tolerated.   Follow Up Recommendations  SNF     Equipment Recommendations  None recommended by PT    Recommendations for Other Services       Precautions / Restrictions Precautions Precautions: Fall Precaution Comments: NWB on RLE and LLE WBAT Required Braces or Orthoses: Other Brace(ortho shoe for RLE) Restrictions Weight Bearing Restrictions: Yes RLE Weight Bearing: Touchdown weight bearing LLE Weight Bearing: Weight bearing as tolerated Other Position/Activity Restrictions: ortho shoe R foot    Mobility  Bed Mobility Overal bed mobility: Needs Assistance Bed Mobility: Supine to Sit;Sit to Supine     Supine to sit: Mod assist Sit to supine: Mod assist;Max assist      Transfers                 General transfer comment: deferred due to post op day 0 and no ortho shoe for RLE  Ambulation/Gait             General Gait Details: deferred due to post op day 0 and lack of ortho shoe   Stairs             Wheelchair Mobility    Modified Rankin (Stroke Patients Only)        Balance Overall balance assessment: Needs assistance Sitting-balance support: Bilateral upper extremity supported Sitting balance-Leahy Scale: Poor Sitting balance - Comments: requires some support to sit and not list posteriorly Postural control: Posterior lean                                  Cognition Arousal/Alertness: Awake/alert Behavior During Therapy: Flat affect Overall Cognitive Status: No family/caregiver present to determine baseline cognitive functioning                                 General Comments: pt is working well with one step at a time, slow to progress to side of bed      Exercises General Exercises - Lower Extremity Ankle Circles/Pumps: AROM;Both;10 reps Long Arc Quad: AAROM;AROM;Both;10 reps Heel Slides: AROM;Both;10 reps    General Comments General comments (skin integrity, edema, etc.): sat bedside with ROM to legs, challenged balance to stay up      Pertinent Vitals/Pain Pain Assessment: Faces Faces Pain Scale: Hurts a little bit Pain Location: neck and back, generalized Pain Descriptors / Indicators: Guarding;Tightness Pain Intervention(s): Limited activity within patient's tolerance;Monitored during session;Repositioned  Home Living                      Prior Function            PT Goals (current goals can now be found in the care plan section)      Frequency    Min 3X/week      PT Plan Current plan remains appropriate    Co-evaluation              AM-PAC PT "6 Clicks" Mobility   Outcome Measure  Help needed turning from your back to your side while in a flat bed without using bedrails?: None Help needed moving from lying on your back to sitting on the side of a flat bed without using bedrails?: A Lot Help needed moving to and from a bed to a chair (including a wheelchair)?: A Lot Help needed standing up from a chair using your arms (e.g., wheelchair or bedside chair)?: A  Lot Help needed to walk in hospital room?: A Lot Help needed climbing 3-5 steps with a railing? : Total 6 Click Score: 13    End of Session   Activity Tolerance: Treatment limited secondary to medical complications (Comment) Patient left: in bed;with call bell/phone within reach;with bed alarm set;Other (comment)(elevation of legs ) Nurse Communication: Mobility status PT Visit Diagnosis: Unsteadiness on feet (R26.81);Muscle weakness (generalized) (M62.81);Difficulty in walking, not elsewhere classified (R26.2);Adult, failure to thrive (R62.7);Repeated falls (R29.6);Pain Pain - Right/Left: (back and neck) Pain - part of body: (spine)     Time: 3976-7341 PT Time Calculation (min) (ACUTE ONLY): 28 min  Charges:  $Therapeutic Exercise: 8-22 mins $Therapeutic Activity: 8-22 mins                    MCCOY TESTA 02/03/2020, 3:34 PM  Samul Dada, PT MS Acute Rehab Dept. Number: [facility] [phone] and [facility] [phone]

## 2020-02-03 NOTE — Interval H&P Note (Signed)
History and Physical Interval Note:  02/03/2020 8:29 AM  Chris Bowman  has presented today for surgery, with the diagnosis of osteomyelitis, right foot.  The various methods of treatment have been discussed with the patient and family. After consideration of risks, benefits and other options for treatment, the patient has consented to  Procedure(s): AMPUTATION RAY, transmetatarsal of right foot (Right) IRRIGATION AND DEBRIDEMENT EXTREMITY, left great toe (Left) as a surgical intervention.  The patient's history has been reviewed, patient examined, no change in status, stable for surgery.  I have reviewed the patient's chart and labs.  Questions were answered to the patient's satisfaction.     Nadara Mustard

## 2020-02-03 NOTE — Anesthesia Procedure Notes (Signed)
Procedure Name: LMA Insertion Date/Time: 02/03/2020 9:59 AM Performed by: Drema Pry, CRNA Pre-anesthesia Checklist: Patient identified, Emergency Drugs available, Suction available and Patient being monitored Patient Re-evaluated:Patient Re-evaluated prior to induction Oxygen Delivery Method: Circle System Utilized Preoxygenation: Pre-oxygenation with 100% oxygen Induction Type: IV induction Ventilation: Mask ventilation without difficulty LMA: LMA inserted LMA Size: 4.0 Number of attempts: 1 Placement Confirmation: positive ETCO2 Tube secured with: Tape Dental Injury: Teeth and Oropharynx as per pre-operative assessment

## 2020-02-03 NOTE — Progress Notes (Signed)
PROGRESS NOTE  Chris Bowman IAX:[id number] DOB: 01-Mar-1961 DOA: 02/01/2020 PCP: Patient, No Pcp Per  HPI/Recap of past 24 hours:  T-max 101.3 at 3am,  He is seen after returning from the OR He is currently working with PT in room No acute complaints   Assessment/Plan: Principal Problem:   Sepsis due to undetermined organism [facility]) Active Problems:   Type 2 diabetes mellitus with hyperglycemia, without long-term current use of insulin (HCC)   Ulcer of left foot due to type 2 diabetes mellitus ([place])   Hyponatremia   Renal insufficiency   Weakness   Falls frequently   Osteomyelitis of foot, right, acute (HCC)  Sepsis /M SSA bacteremia/bilateral foot osteomyelitis/MSSAUTI He is started on Ancef Ancef Leukocytosis WBC 24.6 on presentation, down to 16 this morning will proceed with TEE to rule out endocarditis, cardiology made aware Repeat blood culture ordered on 3/6 ID /orthoconsult/cardiology input appreciated  bilateral foot osteomyelitis S/p AMPUTATION RAY, transmetatarsal of right foot, IRRIGATION AND DEBRIDEMENT EXTREMITY, left great toe on 3/6 Orthopedic Dr. Sharol Given input appreciated, will follow recommendation  Hyponatremia He is started on LR on presentation ,continue LR, slowly improving, repeat BMP  Hypomagnesemia Replace mag, repeat lab in the morning  DM, uncontrolled -Apparently did not believe he had DM and so didn't go back to a physician since about 2008 -DM coordinator consult -A1c is 11.3 -Start Levemir 6 units twice daily, continue SSI, continue adjust insulin  AKI UA with large hemoglobin, trace leukocyte, few bacteria Urine culture positive for staph aureus ck unremarkable  Renal ultrasound no obstruction Patient slowly improved ,renal dosing meds  lft elevation Hepatitis panel negative  ck unremarkable  Abdominal ultrasound "Cholelithiasis. No sonographic evidence of cholecystitis or biliary ductal dilatation." Improving with treating  sepsis   Microcytic anemia Hgb 9.8 ,MCV 68.8 UA showed 10-20 RBC/HPF Anemia work-up  History of delusional disorder Currently calm  FTT/frequent falls Fall precaution, PT eval Low normal B12, B12 supplement  DVT Prophylaxis: Lovenox subcu   Code Status: full  Family Communication: patient   Disposition Plan:    Patient came from:         home                                                                                                 Anticipated d/c place: SNF  Barriers to d/c OR conditions which need to be met to effect a safe d/c:  Multiple lab abnormalities, treating bacteremia, osteomyelitis, needs TEE ,not medically ready to discharge   Consultants:  ID  Neurology  Cardiology for TEE  Procedures:  None  Antibiotics:  Ancef   Objective: BP 117/61 (BP Location: Right Arm)   Pulse 85   Temp 98.2 F (36.8 C)   Resp 16   Ht 5' 8"  (1.727 m)   Wt 83 kg   SpO2 100%   BMI 27.82 kg/m   Intake/Output Summary (Last 24 hours) at 02/03/2020 1346 Last data filed at 02/03/2020 1034 Gross per 24 hour  Intake 3260.27 ml  Output 1815 ml  Net 1445.27 ml   [employer]  02/01/20 0801 02/01/20 2300  Weight: 68 kg 83 kg    Exam: Patient is examined daily including today on 02/03/2020, exams remain the same as of yesterday except that has changed    General:  Alert and calm  Cardiovascular: RRR  Respiratory: CTABL  Abdomen: Soft/ND/NT, positive BS  Musculoskeletal: Post operative changes,+ wound VAC  Neuro: alert, oriented   Data Reviewed: Basic Metabolic Panel: Recent Labs  Lab 02/01/20 0805 02/02/20 0734 02/03/20 0323  NA 120* 121* 124*  K 4.2 4.0 4.1  CL 85* 90* 93*  CO2 19* 19* 21*  GLUCOSE 316* 271* 225*  BUN 42* 34* 31*  CREATININE 2.33* 1.68* 1.57*  CALCIUM 8.6* 8.0* 7.8*  MG  --   --  1.5*   Liver Function Tests: Recent Labs  Lab 02/01/20 0805 02/03/20 0323  AST 75* 43*  ALT 109* 41  ALKPHOS 317* 258*  BILITOT 1.2  0.5  PROT 8.2* 6.1*  ALBUMIN 2.3* 1.6*   Recent Labs  Lab 02/03/20 0323  LIPASE 30   Recent Labs  Lab 02/02/20 1131  AMMONIA 18   CBC: Recent Labs  Lab 02/01/20 0805 02/01/20 0930 02/02/20 0734 02/03/20 0323  WBC 24.6*  --  18.4* 16.0*  NEUTROABS  --  19.3*  --  12.8*  HGB 11.7*  --  9.8* 9.2*  HCT 36.5*  --  30.2* 27.5*  MCV 70.5*  --  68.8* 67.4*  PLT 441*  --  303 314   Cardiac Enzymes:   Recent Labs  Lab 02/01/20 1551 02/03/20 0323  CKTOTAL 179 62   BNP (last 3 results) No results for input(s): BNP in the last 8760 hours.  ProBNP (last 3 results) No results for input(s): PROBNP in the last 8760 hours.  CBG: Recent Labs  Lab 02/02/20 2112 02/03/20 0705 02/03/20 0842 02/03/20 0927 02/03/20 1044  GLUCAP 323* 359* 303* 247* 207*    Recent Results (from the past 240 hour(s))  Culture, blood (routine x 2)     Status: Abnormal (Preliminary result)   Collection Time: 02/01/20  9:20 AM   Specimen: BLOOD LEFT ARM  Result Value Ref Range Status   Specimen Description BLOOD LEFT ARM  Final   Special Requests   Final    BOTTLES DRAWN AEROBIC AND ANAEROBIC Blood Culture adequate volume   Culture  Setup Time   Final    IN BOTH AEROBIC AND ANAEROBIC BOTTLES GRAM POSITIVE COCCI IN CLUSTERS CRITICAL RESULT CALLED TO, READ BACK BY AND VERIFIED WITH: T DANG PHARMD 02/01/20 2244 JDW    Culture (A)  Final    STAPHYLOCOCCUS AUREUS SUSCEPTIBILITIES TO FOLLOW Performed at [facility], [place] [address] [place], [place] [address]    Report Status PENDING  Incomplete  Culture, blood (routine x 2)     Status: Abnormal (Preliminary result)   Collection Time: 02/01/20  9:30 AM   Specimen: BLOOD LEFT HAND  Result Value Ref Range Status   Specimen Description BLOOD LEFT HAND  Final   Special Requests   Final    BOTTLES DRAWN AEROBIC ONLY Blood Culture results may not be optimal due to an inadequate volume of blood received in culture bottles   Culture  Setup Time    Final    AEROBIC BOTTLE ONLY GRAM POSITIVE COCCI IN CLUSTERS Organism ID to follow CRITICAL RESULT CALLED TO, READ BACK BY AND VERIFIED WITHAlona Bene [facility] 02/02/20 6286 JDW Performed at [facility], [place],  [place] [address]    Culture STAPHYLOCOCCUS AUREUS (A)  Final   Report Status PENDING  Incomplete  Blood Culture ID Panel (Reflexed)     Status: Abnormal   Collection Time: 02/01/20  9:30 AM  Result Value Ref Range Status   Enterococcus species NOT DETECTED NOT DETECTED Final   Listeria monocytogenes NOT DETECTED NOT DETECTED Final   Staphylococcus species DETECTED (A) NOT DETECTED Final    Comment: CRITICAL RESULT CALLED TO, READ BACK BY AND VERIFIED WITH: V BRYK PHARMD 02/02/20 0213 JDW    Staphylococcus aureus (BCID) DETECTED (A) NOT DETECTED Final    Comment: Methicillin (oxacillin) susceptible Staphylococcus aureus (MSSA). Preferred therapy is anti staphylococcal beta lactam antibiotic (Cefazolin or Nafcillin), unless clinically contraindicated. CRITICAL RESULT CALLED TO, READ BACK BY AND VERIFIED WITH: V BRYK PHARMD 02/02/20 0213 JDW    Methicillin resistance NOT DETECTED NOT DETECTED Final   Streptococcus species NOT DETECTED NOT DETECTED Final   Streptococcus agalactiae NOT DETECTED NOT DETECTED Final   Streptococcus pneumoniae NOT DETECTED NOT DETECTED Final   Streptococcus pyogenes NOT DETECTED NOT DETECTED Final   Acinetobacter baumannii NOT DETECTED NOT DETECTED Final   Enterobacteriaceae species NOT DETECTED NOT DETECTED Final   Enterobacter cloacae complex NOT DETECTED NOT DETECTED Final   Escherichia coli NOT DETECTED NOT DETECTED Final   Klebsiella oxytoca NOT DETECTED NOT DETECTED Final   Klebsiella pneumoniae NOT DETECTED NOT DETECTED Final   Proteus species NOT DETECTED NOT DETECTED Final   Serratia marcescens NOT DETECTED NOT DETECTED Final   Haemophilus influenzae NOT DETECTED NOT DETECTED Final   Neisseria meningitidis NOT DETECTED NOT  DETECTED Final   Pseudomonas aeruginosa NOT DETECTED NOT DETECTED Final   Candida albicans NOT DETECTED NOT DETECTED Final   Candida glabrata NOT DETECTED NOT DETECTED Final   Candida krusei NOT DETECTED NOT DETECTED Final   Candida parapsilosis NOT DETECTED NOT DETECTED Final   Candida tropicalis NOT DETECTED NOT DETECTED Final    Comment: Performed at [facility], [place] [address] [place], [place] [address]  SARS CORONAVIRUS 2 (TAT 6-24 HRS) Nasopharyngeal Nasopharyngeal Swab     Status: None   Collection Time: 02/01/20 10:26 AM   Specimen: Nasopharyngeal Swab  Result Value Ref Range Status   SARS Coronavirus 2 NEGATIVE NEGATIVE Final    Comment: (NOTE) SARS-CoV-2 target nucleic acids are NOT DETECTED. The SARS-CoV-2 RNA is generally detectable in upper and lower respiratory specimens during the acute phase of infection. Negative results do not preclude SARS-CoV-2 infection, do not rule out co-infections with other pathogens, and should not be used as the sole basis for treatment or other patient management decisions. Negative results must be combined with clinical observations, patient history, and epidemiological information. The expected result is Negative. Fact Sheet for Patients: SugarRoll.be Fact Sheet for Healthcare Providers: https://www.woods-mathews.com/ This test is not yet approved or cleared by the [place] FDA and  has been authorized for detection and/or diagnosis of SARS-CoV-2 by FDA under an Emergency Use Authorization (EUA). This EUA will remain  in effect (meaning this test can be used) for the duration of the COVID-19 declaration under Section 56 4(b)(1) of the Act, 21 U.S.C. section 360bbb-3(b)(1), unless the authorization is terminated or revoked sooner. Performed at [facility], [place] [address], [place], [place] [address]   Urine culture     Status: Abnormal   Collection Time: 02/01/20  3:51 PM    Specimen: In/Out Cath Urine  Result Value Ref Range Status  Specimen Description IN/OUT CATH URINE  Final   Special Requests   Final    NONE Performed at [facility], [place] [address] [place], [place] [address]    Culture 50,000 COLONIES/mL STAPHYLOCOCCUS AUREUS (A)  Final   Report Status 02/03/2020 FINAL  Final   Organism ID, Bacteria STAPHYLOCOCCUS AUREUS (A)  Final      Susceptibility   Staphylococcus aureus - MIC*    CIPROFLOXACIN <=0.5 SENSITIVE Sensitive     GENTAMICIN <=0.5 SENSITIVE Sensitive     NITROFURANTOIN <=16 SENSITIVE Sensitive     OXACILLIN <=0.25 SENSITIVE Sensitive     TETRACYCLINE <=1 SENSITIVE Sensitive     VANCOMYCIN 1 SENSITIVE Sensitive     TRIMETH/SULFA <=10 SENSITIVE Sensitive     CLINDAMYCIN <=0.25 SENSITIVE Sensitive     RIFAMPIN <=0.5 SENSITIVE Sensitive     Inducible Clindamycin NEGATIVE Sensitive     * 50,000 COLONIES/mL STAPHYLOCOCCUS AUREUS  MRSA PCR Screening     Status: None   Collection Time: 02/02/20  9:37 AM   Specimen: Nasal Mucosa; Nasopharyngeal  Result Value Ref Range Status   MRSA by PCR NEGATIVE NEGATIVE Final    Comment:        The GeneXpert MRSA Assay (FDA approved for NASAL specimens only), is one component of a comprehensive MRSA colonization surveillance program. It is not intended to diagnose MRSA infection nor to guide or monitor treatment for MRSA infections. Performed at [facility], [place] [address] [place], [place] [address]   Surgical pcr screen     Status: Abnormal   Collection Time: 02/02/20 10:41 PM   Specimen: Nasal Mucosa; Nasal Swab  Result Value Ref Range Status   MRSA, PCR NEGATIVE NEGATIVE Final   Staphylococcus aureus POSITIVE (A) NEGATIVE Final    Comment: (NOTE) The Xpert SA Assay (FDA approved for NASAL specimens in patients 48 years of age and older), is one component of a comprehensive surveillance program. It is not intended to diagnose infection nor to guide or monitor  treatment. Performed at [facility], [place] [address], [place], [place] [address]      Studies: US Abdomen Complete  Result Date: 02/02/2020 CLINICAL DATA:  Elevated liver function tests. Renal insufficiency with elevated creatinine. EXAM: ABDOMEN ULTRASOUND COMPLETE COMPARISON:  None. FINDINGS: Gallbladder: Several gallstones are seen, largest measuring 1.2 cm. Gallbladder is incompletely distended. No abnormal gallbladder wall thickening or pericholecystic fluid noted. No sonographic Murphy sign noted by sonographer. Common bile duct: Diameter: 3 mm, within normal limits. Liver: No focal lesion identified. Within normal limits in parenchymal echogenicity. Portal vein is patent on color Doppler imaging with normal direction of blood flow towards the liver. IVC: No abnormality visualized. Pancreas: Pancreas is not visualized due to overlying bowel gas. Spleen: Size and appearance within normal limits. Right Kidney: Length: 11.3 cm. Mildly increased parenchymal echogenicity noted. No mass or hydronephrosis visualized. Left Kidney: Length: 12.3 cm. Mildly increased parenchymal echogenicity noted. No mass or hydronephrosis visualized. Abdominal aorta: No aneurysm visualized, although distal abdominal aorta obscured by overlying bowel gas. Other findings: None. IMPRESSION: Cholelithiasis. No sonographic evidence of cholecystitis or biliary ductal dilatation. Mildly increased renal parenchymal echogenicity, consistent with medical renal disease. No evidence of hydronephrosis. Electronically Signed   By: Marlaine Hind M.D.   On: 02/02/2020 16:48    Scheduled Meds: . Chlorhexidine Gluconate Cloth  6 each Topical Daily  . [START ON 02/05/2020] enoxaparin (LOVENOX) injection  40 mg Subcutaneous Q24H  . feeding supplement (GLUCERNA  SHAKE)  237 mL Oral TID BM  . insulin aspart  0-15 Units Subcutaneous TID WC  . insulin aspart  0-5 Units Subcutaneous QHS  . insulin detemir  6 Units Subcutaneous BID  .  multivitamin with minerals  1 tablet Oral Daily  . mupirocin ointment  1 application Nasal BID  . senna-docusate  1 tablet Oral BID  . sodium chloride flush  3 mL Intravenous Q12H    Continuous Infusions: . sodium chloride 10 mL/hr at 02/03/20 1146  .  ceFAZolin (ANCEF) IV 2 g (02/03/20 1251)  . lactated ringers 100 mL/hr at 02/03/20 0838  . magnesium sulfate bolus IVPB    . methocarbamol (ROBAXIN) IV       Time spent: 4mns,  I have personally reviewed and interpreted on  02/03/2020 daily labs, tele strips, imagings as discussed above under date review session and assessment and plans.  I reviewed all nursing notes, pharmacy notes, consultant notes,  vitals, pertinent old records  I have discussed plan of care as described above with RN , patient  on 02/03/2020   FFlorencia ReasonsMD, PhD, FACP  [facility]  Available via Epic secure chat 7am-7pm for nonurgent issues Please page for urgent issues, pager number available through [email or web address] .   02/03/2020, 1:46 PM  LOS: 2 days

## 2020-02-03 NOTE — Transfer of Care (Signed)
Immediate Anesthesia Transfer of Care Note  Patient: Chris Bowman  Procedure(s) Performed: AMPUTATION RAY, transmetatarsal of right foot (Right ) IRRIGATION AND DEBRIDEMENT EXTREMITY, left great toe (Left )  Patient Location: PACU  Anesthesia Type:General  Level of Consciousness: drowsy and responds to stimulation  Airway & Oxygen Therapy: Patient Spontanous Breathing and Patient connected to nasal cannula oxygen  Post-op Assessment: Report given to RN and Post -op Vital signs reviewed and stable  Post vital signs: Reviewed and stable  Last Vitals:  Vitals Value Taken Time  BP 100/58 02/03/20 1043  Temp    Pulse 86 02/03/20 1044  Resp 17 02/03/20 1044  SpO2 95 % 02/03/20 1044  Vitals shown include unvalidated device data.  Last Pain:  Vitals:   02/03/20 0738  TempSrc:   PainSc: 0-No pain         Complications: No apparent anesthesia complications

## 2020-02-04 ENCOUNTER — Inpatient Hospital Stay (HOSPITAL_COMMUNITY): Payer: Non-veteran care

## 2020-02-04 LAB — CBC WITH DIFFERENTIAL/PLATELET
Abs Immature Granulocytes: 0.12 10*3/uL — ABNORMAL HIGH (ref 0.00–0.07)
Basophils Absolute: 0.1 10*3/uL (ref 0.0–0.1)
Basophils Relative: 1 %
Eosinophils Absolute: 0 10*3/uL (ref 0.0–0.5)
Eosinophils Relative: 0 %
HCT: 29.1 % — ABNORMAL LOW (ref 39.0–52.0)
Hemoglobin: 9.3 g/dL — ABNORMAL LOW (ref 13.0–17.0)
Immature Granulocytes: 1 %
Lymphocytes Relative: 13 %
Lymphs Abs: 2 10*3/uL (ref 0.7–4.0)
MCH: 22.7 pg — ABNORMAL LOW (ref 26.0–34.0)
MCHC: 32 g/dL (ref 30.0–36.0)
MCV: 71 fL — ABNORMAL LOW (ref 80.0–100.0)
Monocytes Absolute: 1.4 10*3/uL — ABNORMAL HIGH (ref 0.1–1.0)
Monocytes Relative: 9 %
Neutro Abs: 11.2 10*3/uL — ABNORMAL HIGH (ref 1.7–7.7)
Neutrophils Relative %: 76 %
Platelets: 375 10*3/uL (ref 150–400)
RBC: 4.1 MIL/uL — ABNORMAL LOW (ref 4.22–5.81)
RDW: 15.2 % (ref 11.5–15.5)
WBC: 14.7 10*3/uL — ABNORMAL HIGH (ref 4.0–10.5)
nRBC: 0 % (ref 0.0–0.2)

## 2020-02-04 LAB — CULTURE, BLOOD (ROUTINE X 2): Special Requests: ADEQUATE

## 2020-02-04 LAB — COMPREHENSIVE METABOLIC PANEL
ALT: 27 U/L (ref 0–44)
AST: 49 U/L — ABNORMAL HIGH (ref 15–41)
Albumin: 1.8 g/dL — ABNORMAL LOW (ref 3.5–5.0)
Alkaline Phosphatase: 239 U/L — ABNORMAL HIGH (ref 38–126)
Anion gap: 10 (ref 5–15)
BUN: 24 mg/dL — ABNORMAL HIGH (ref 6–20)
CO2: 24 mmol/L (ref 22–32)
Calcium: 8.1 mg/dL — ABNORMAL LOW (ref 8.9–10.3)
Chloride: 92 mmol/L — ABNORMAL LOW (ref 98–111)
Creatinine, Ser: 1.53 mg/dL — ABNORMAL HIGH (ref 0.61–1.24)
GFR calc Af Amer: 58 mL/min — ABNORMAL LOW (ref >60)
GFR calc non Af Amer: 50 mL/min — ABNORMAL LOW (ref >60)
Glucose, Bld: 253 mg/dL — ABNORMAL HIGH (ref 70–99)
Potassium: 4.5 mmol/L (ref 3.5–5.1)
Sodium: 126 mmol/L — ABNORMAL LOW (ref 135–145)
Total Bilirubin: 0.6 mg/dL (ref 0.3–1.2)
Total Protein: 6.4 g/dL — ABNORMAL LOW (ref 6.5–8.1)

## 2020-02-04 LAB — HEPATITIS C VRS RNA DETECT BY PCR-QUAL: Hepatitis C Vrs RNA by PCR-Qual: NEGATIVE

## 2020-02-04 LAB — RHEUMATOID FACTOR: Rheumatoid fact SerPl-aCnc: 14.2 IU/mL — ABNORMAL HIGH (ref 0.0–13.9)

## 2020-02-04 LAB — GLUCOSE, CAPILLARY
Glucose-Capillary: 196 mg/dL — ABNORMAL HIGH (ref 70–99)
Glucose-Capillary: 246 mg/dL — ABNORMAL HIGH (ref 70–99)
Glucose-Capillary: 163 mg/dL — ABNORMAL HIGH (ref 70–99)

## 2020-02-04 LAB — MAGNESIUM: Magnesium: 2 mg/dL (ref 1.7–2.4)

## 2020-02-04 IMAGING — DX DG HAND 2V*L*
2 series · 2 of 2 positions shown · non-contrast
Comparison: None.

CLINICAL DATA: Pain

EXAM:
LEFT HAND - 2 VIEW

[hand ap]
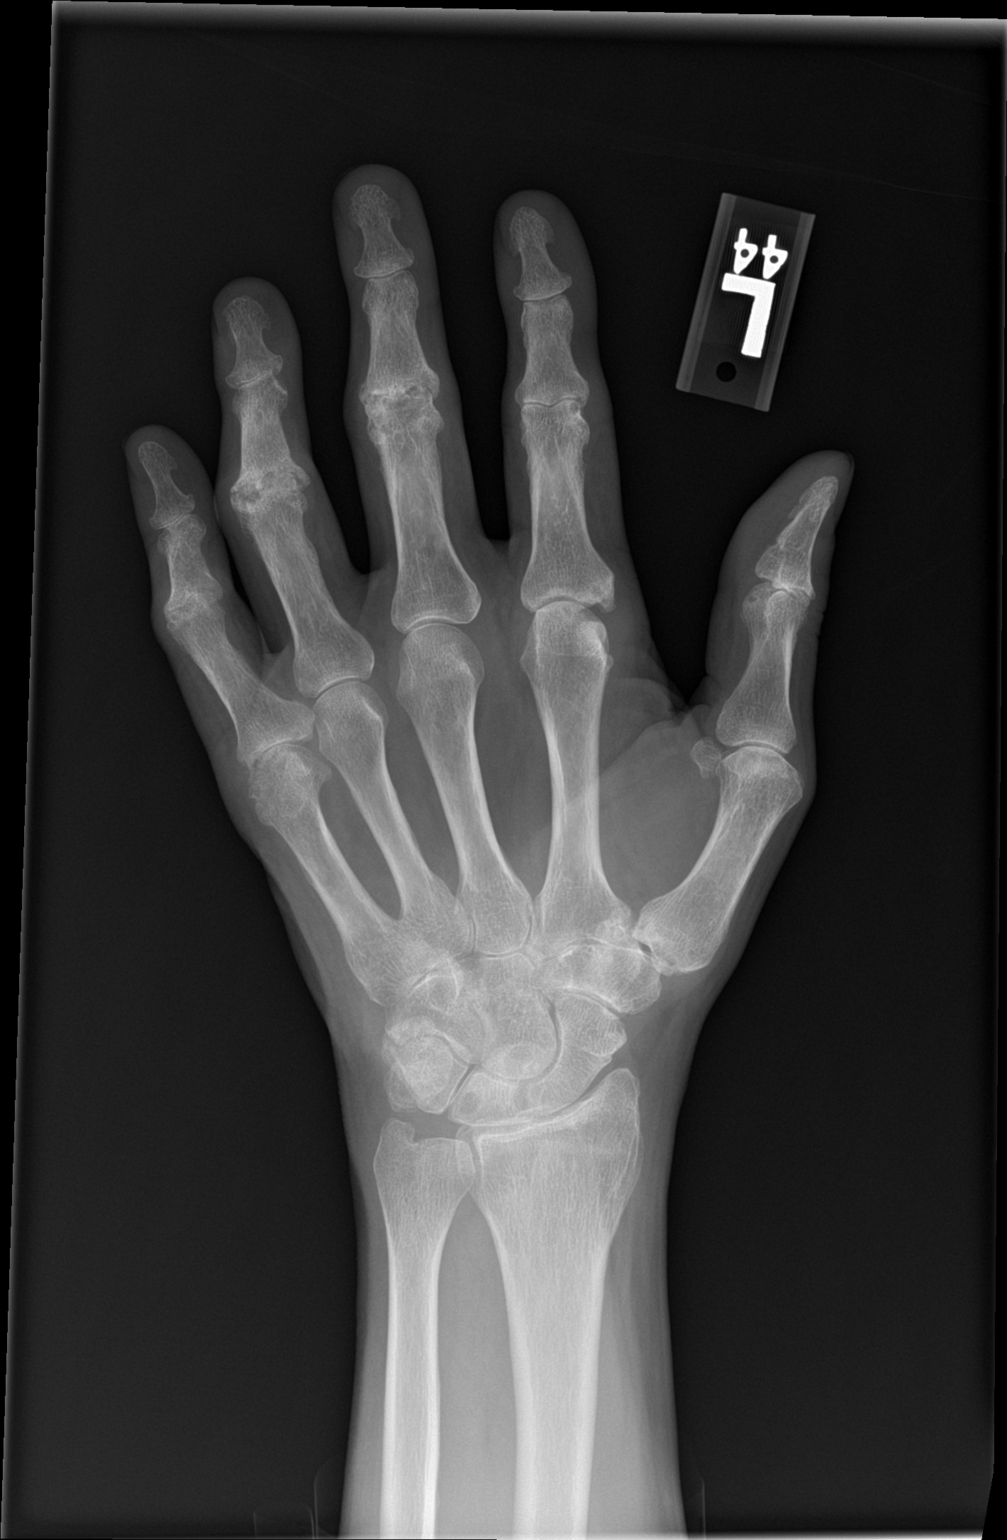

[hand lat]
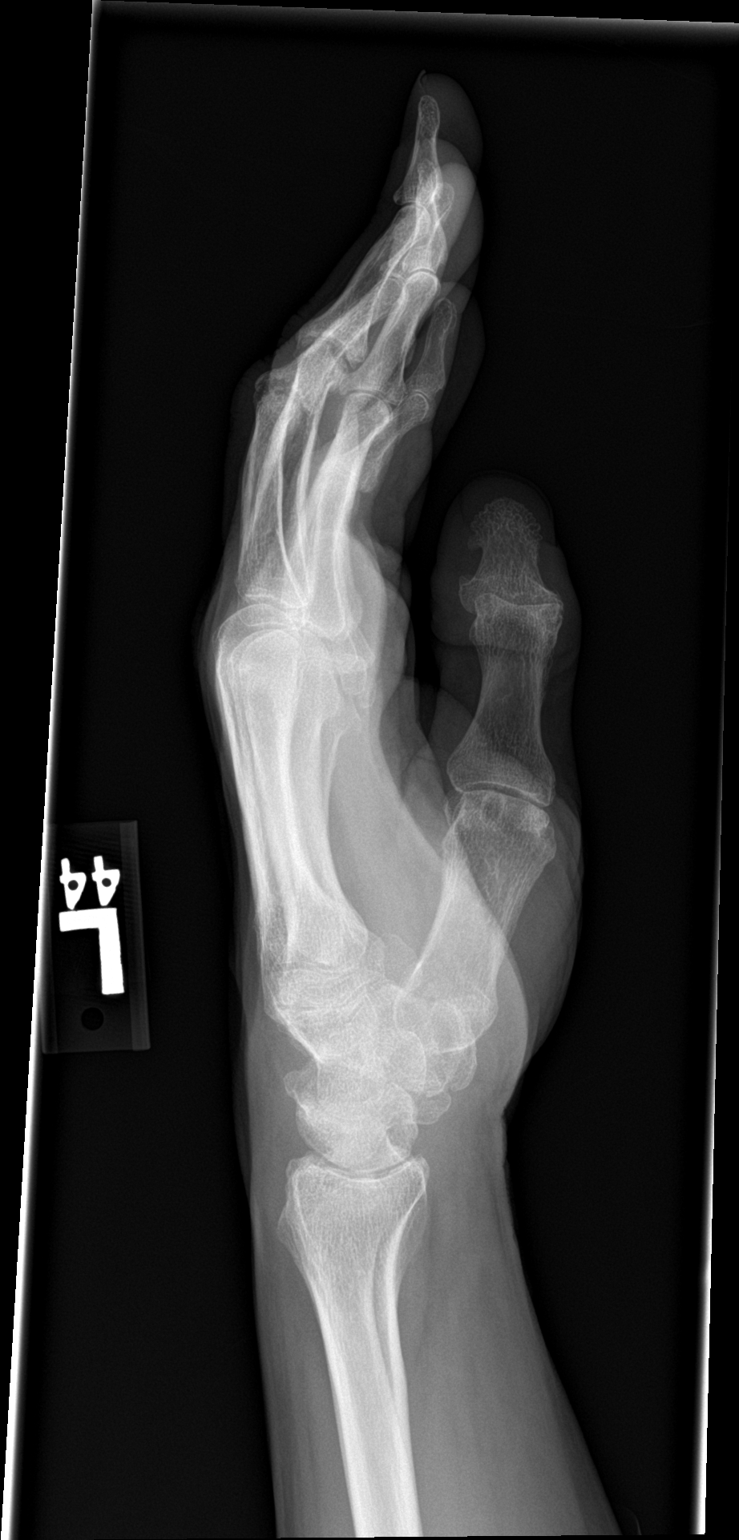

[2 of 2 positions shown; findings below may reference images not displayed]

FINDINGS: There are mixed erosive and productive changes of the proximal
interphalangeal joints of the third through fifth digits. There are
degenerative changes at the fifth metacarpophalangeal joint.
Degenerative changes are noted at the radiocarpal joint. There is no
acute displaced fracture or dislocation.
IMPRESSION: 1. No acute displaced fracture or dislocation.
2. Probable erosive osteoarthritis as above.

## 2020-02-04 IMAGING — DX DG HAND 2V*R*
2 series · 2 of 2 positions shown · non-contrast
Comparison: None.

CLINICAL DATA: Pain

EXAM:
RIGHT HAND - 2 VIEW

[hand ap]
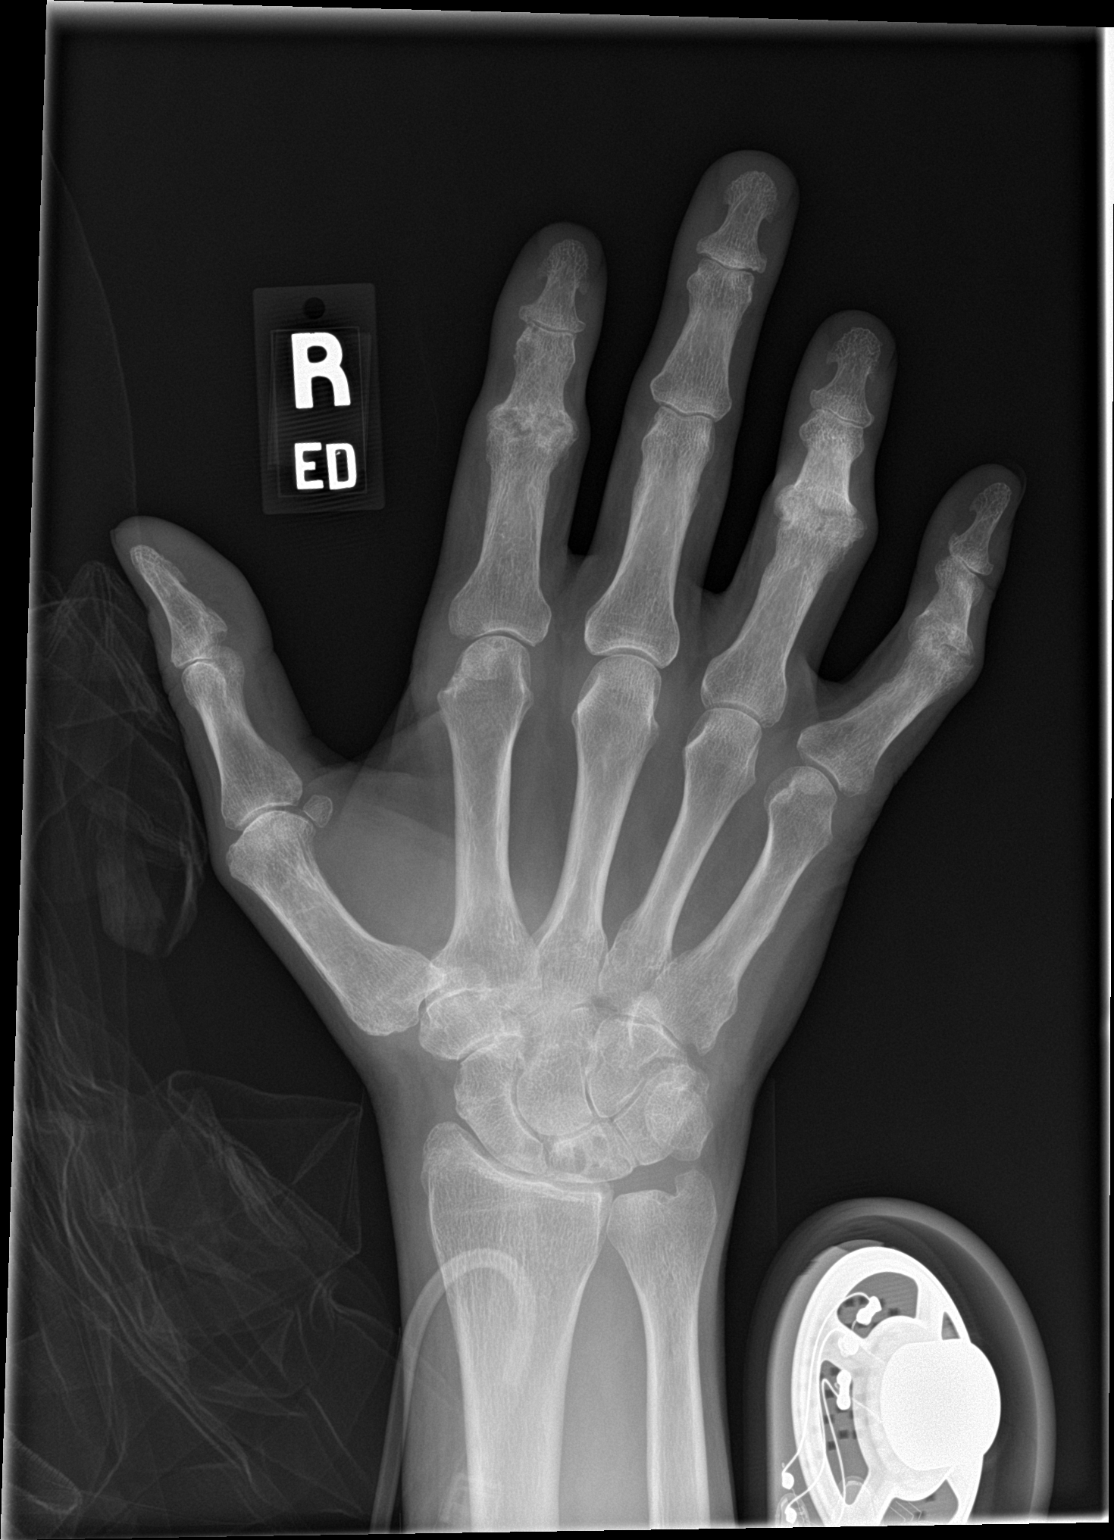

[hand lat]
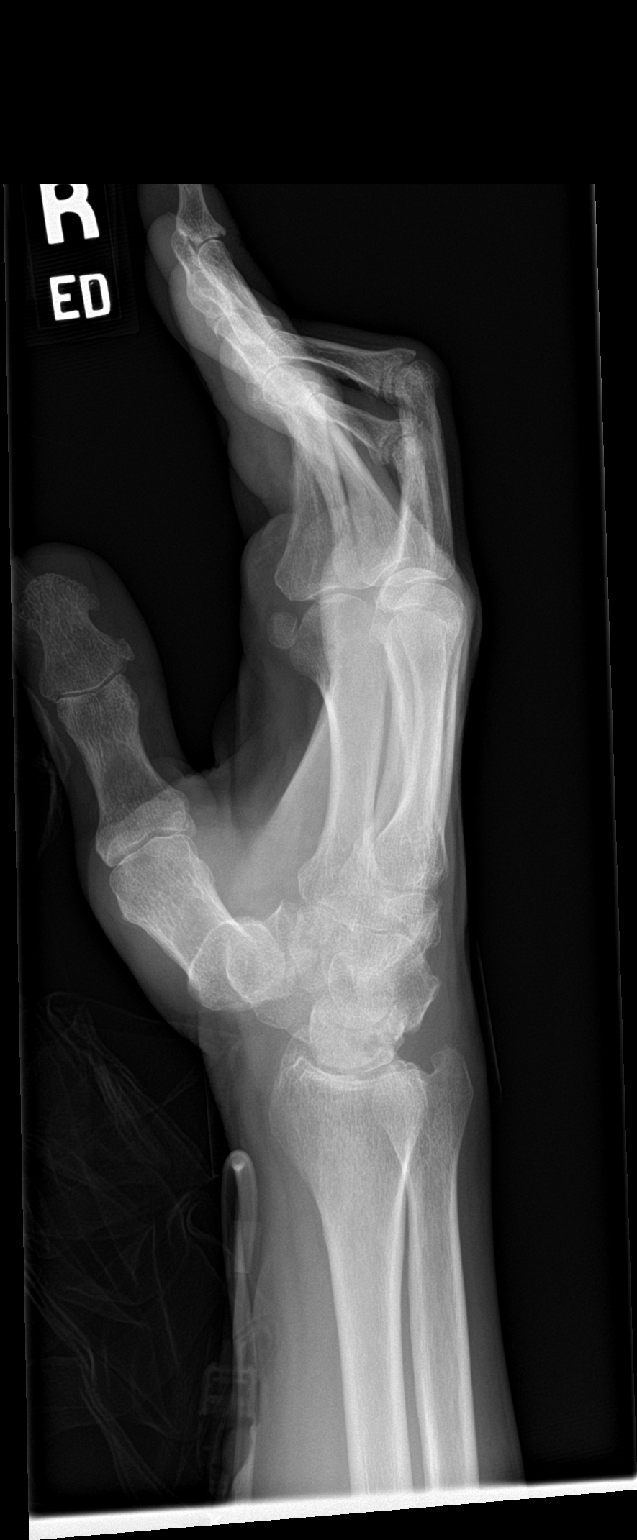

[2 of 2 positions shown; findings below may reference images not displayed]

FINDINGS: There are degenerative changes at the proximal interphalangeal
joints of the second, fourth, and fifth digits. The changes appear
to be both erosive and productive. There are degenerative changes at
the radiocarpal joint with subchondral cystic changes involving the
scaphoid and lunate. Degenerative changes are noted at the first
CMC. Nonspecific flexion deformities are noted at the proximal
interphalangeal joints of the second, fourth, and fifth digits.
There is no acute displaced fracture or dislocation.
IMPRESSION: 1. No acute displaced fracture or dislocation.
2. Probable erosive osteoarthritis of the proximal interphalangeal
joints of the second, fourth, and fifth digits.
3. Degenerative changes at the radiocarpal joint.

## 2020-02-04 MED ORDER — SODIUM CHLORIDE 0.9 % IV SOLN: INTRAVENOUS | Status: AC

## 2020-02-04 MED ORDER — TAMSULOSIN HCL 0.4 MG PO CAPS
0.4000 mg | ORAL_CAPSULE | Freq: Every day | ORAL | Status: DC
  Administered 2020-02-04 – 2020-03-01 (×27): 0.4 mg via ORAL
  Filled 2020-02-04 (×27): qty 1

## 2020-02-04 MED ORDER — MAGNESIUM SULFATE 2 GM/50ML IV SOLN
2.0000 g | Freq: Once | INTRAVENOUS | Status: AC
  Administered 2020-02-04: 2 g via INTRAVENOUS
  Filled 2020-02-04: qty 50

## 2020-02-04 MED ORDER — SODIUM CHLORIDE 0.9 % IV SOLN: INTRAVENOUS | Status: DC

## 2020-02-04 MED ORDER — INSULIN DETEMIR 100 UNIT/ML ~~LOC~~ SOLN
8.0000 [IU] | Freq: Two times a day (BID) | SUBCUTANEOUS | Status: DC
  Administered 2020-02-04 – 2020-02-06 (×4): 8 [IU] via SUBCUTANEOUS
  Filled 2020-02-04 (×5): qty 0.08

## 2020-02-04 NOTE — Progress Notes (Addendum)
NEUROLOGY PROGRESS NOTE  This is a 59 year old male presenting to the hospital for generalized weakness, gait instability and multiple falls with symptoms of peripheral neuorpathy, found to have sepsis/MSSA bacteremia with bilateral foot osteomyelitis s/p amputation of L transmetatarsal of R foot with wound vac application 3/6.  Subjective: Patient is doing well today   Exam: Vitals:   02/03/20 2353 02/04/20 0450  BP: 136/72 (!) 145/81  Pulse: 88 82  Resp:    Temp: 99.5 F (37.5 C) 98.8 F (37.1 C)  SpO2: 96% 99%    ROS:  Per HPI  Physical Exam  Constitutional: Appears well-developed and well-nourished.  Psych: Affect appropriate to situation Eyes: No scleral injection HENT: No OP obstrucion Head: Normocephalic.  Cardiovascular: Normal rate and regular rhythm.  Respiratory: Effort normal, non-labored breathing GI: Soft.  No distension. There is no tenderness.  Skin: L foot wound vac in place   Neuro:  Mental Status: Alert, oriented, thought content appropriate.  Speech fluent without evidence of aphasia.  Able to follow 3 step commands without difficulty. Cranial Nerves: grossly intact Motor: Right : Upper extremity   5/5    Left:     Upper extremity   5/5  Lower extremity   5/5     Lower extremity   5/5 Tone and bulk:normal tone throughout; no atrophy noted Sensory: Pinprick and light touch intact throughout, bilaterally Deep Tendon Reflexes: 0/2 reflexes throughout  Plantars: Right: downgoing   Left:  UTA Gait: UTA   A/P: Peripheral neuropathy -MSSA bacteremia  osteomyelitis of R foot s/p transmetarsal amputation 3/6 - neuropathy likely related to underlying uncontrolled DM2 (11.3%) - B12, Folate, heavy metals normal - Continue PT/OT and mgmt of underlying chronic medical conditions per primary team - neuropathy labs pending- vitamin B1, B6, MMA, ANA, ANCA, RF,: please follow and call neurology if abnormal.    Floreen Comber PA-C [facility] [phone]   NEUROHOSPITALIST ADDENDUM Performed a face to face diagnostic evaluation.   I have reviewed the contents of history and physical exam as documented by PA/ARNP/Resident and agree with above documentation.  I have discussed and formulated the above plan as documented. Edits to the note have been made as needed.   59 year old male presenting to the hospital with generalized weakness-had multiple ulcers and foot, then noted to have bacteremia, osteomyelitis and now perhaps a cervical spine discitis/abscess.  Clearly has neuropathy on his exam-high suspicion likely due to uncontrolled diabetes.  It is very possible that it because of his neuropathy and uncontrolled diabetes -he developed foot ulcers that gradually progressed to bacteremia and osteomyelitis-very unfortunate.  Recommendations  -Continue to control blood sugars -Please follow-up remaining neuropathy labs: To rule out other causes for neuropathy -Please follow B1 level  Neurology will be available as needed. If neuropathy does not improve, can be referred to outpatient neurologist.   Georgiana Spinner Mayumi Summerson MD [facility] [phone]   If 7pm to 7am, please call on call as listed on AMION.

## 2020-02-04 NOTE — Progress Notes (Signed)
Patient currently trying to use bedpan to have BM. Call bell in reach.

## 2020-02-04 NOTE — TOC Progression Note (Signed)
Transition of Care [facility]) - Progression Note    Patient Details  Name: Chris Bowman MRN: [id number] Date of Birth: October 25, 1961  Transition of Care [facility]) CM/SW [place], [place] Phone Number: 02/04/2020, 5:18 PM  Clinical Narrative:    CSW met bedside with patient who was alert/oriented and his sister Chris Bowman. CSW provided introduction of name and role and discussed the recommendation of SNF with patient and his sister Chris Bowman. Patient and Chris Bowman both stated they are in agreement to PT recommendation of SNF at discharge. CSW asked if patient has applied for Medicaid and was informed it was applied for this weekend and the current status is unknown. No further questions at this time. TOC team will continue to follow and assist with discharge planning needs.       Expected Discharge Plan and Services                                                 Social Determinants of Health (SDOH) Interventions    Readmission Risk Interventions No flowsheet data found.

## 2020-02-04 NOTE — Plan of Care (Signed)

## 2020-02-04 NOTE — Progress Notes (Addendum)
Patient seems to be more confused today, rambling on. MD made aware. Patient was assisted to standing position to try to use urinal after foley being removed. Patient did not void. Bed pad and gown changed, CHG wipes used. Patient shown how to use I/S and encouraged to use throughout the day. Patient verbalizes understanding. Call bell left in reach.

## 2020-02-04 NOTE — Progress Notes (Signed)
Neurosurgery  59 year old man with poorly controlled diabetes and sepsis found to have neck infection including early cervical spine discitis with phlegmonotous changes in his spine. There is thin phlegmon anterior to the PLL without cord impingement.  Recommend continued IV antibiotic therapy for 6 weeks-3 months as managed by ID, repeat MR C-Spine in 6 weeks with/without contrast, or sooner if progressive myelopathy develops.

## 2020-02-04 NOTE — Progress Notes (Signed)
Bladder scan , patient currently trying to use urinal. Educated on in and out cath and patient agrees if he is unable to void.

## 2020-02-04 NOTE — Progress Notes (Signed)
   [facility] has been requested to perform a transesophageal echocardiogram on [facility] for bacteremia.  After careful review of history and examination, the risks and benefits of transesophageal echocardiogram have been explained including risks of esophageal damage, perforation (1:10,000 risk), bleeding, pharyngeal hematoma as well as other potential complications associated with conscious sedation including aspiration, arrhythmia, respiratory failure and death. Alternatives to treatment were discussed, questions were answered. Patient is willing to proceed.   Tereso Newcomer, PA-C  02/04/2020 12:17 PM

## 2020-02-04 NOTE — H&P (View-Only) (Signed)
PROGRESS NOTE  Debbie Bellucci WKG:[id number] DOB: May 19, 1961 DOA: 02/01/2020 PCP: Patient, No Pcp Per  PCP: Patient, No Pcp Per - [facility]; last saw a doctor in maybe 2008 Consultants:  None Patient coming from:  Home - lives alone; NOK: Heide Scales, [phone]  Chief Complaint: weakness, falls  HPI: Chris Bowman is a 59 y.o. male with unknown medical history presenting with weakness and falls.  He reports that he started having weak spells 2 weeks ago.  He doesn't like to go outside in the weather since he doesn't drive and can't afford to pay a taxi.  He has had "this great limitation on my range of movement", chronic buttocks pain since the falls (improving).  He also noticed R arm pain.  He thought this might be related to excessive portion of steak with A1c sauce from Friday to Saturday morning prior to onset of symptoms.  He decided to eliminate sugar from his diet.  He had been diagnosed with DM in 2008 or so but didn't think he needed medicine for it - "I mean they gave me a few insulin shots on a couple of appointments but I didn't think I needed to go whole hog with every day insulin shots."  He has had fever at home maybe 2 weeks ago.  Significant cough with SOB.  No urinary symptoms.  No recent n/v/d.  No abdominal pain.  He was told in the past that he could have a mental health problem, possibly a delusional disorder, doesn't think it is schizophrenia.  He does not think he still struggles with this, "once I got out of control from my mother and sisters and ex-wife, I think I ... kicked that habit."  Reports h/o taking too many baby ASA and having his stomach pumped and "I have a little incision scar from that."    ED Course:  2 weeks of generalized weakness.  Rectal fever, ?source.  Falls, can't get up.  BD COVID negative.  WBC 24, UA pending.  Glucose 300.  A1c is 11.  Does not see a doctor despite h/o DM. Na++ 120.  Sepsis, ?COVID,  ?maybe a source somewhere.     HPI/Recap of past 24 hours:  T-max 103at 8pm yesterday evening,  He denies pain, poor historian, orientedx3 but speech is tangential    Assessment/Plan: Principal Problem:   Sepsis due to undetermined organism [facility]) Active Problems:   Type 2 diabetes mellitus with hyperglycemia, without long-term current use of insulin (HCC)   Ulcer of left foot due to type 2 diabetes mellitus ([place])   Hyponatremia   Renal insufficiency   Weakness   Falls frequently   Osteomyelitis of foot, right, acute (HCC)  Sepsis /M SSA bacteremia/bilateral foot osteomyelitis/MSSAUTI He is started on Ancef Leukocytosis WBC 24.6 on presentation, down to 14.7 this morning will proceed with TEE to rule out endocarditis, cardiology made aware Repeat blood culture  on 3/6, no growth ID /orthoconsult/cardiology input appreciated  bilateral foot osteomyelitis debridement of the ulcer on the left great toe S/p AMPUTATION RAY, transmetatarsal of right foot, IRRIGATION AND DEBRIDEMENT EXTREMITY, left great toe with wound VAC application on 3/6 Orthopedic Dr. Sharol Given input appreciated, will follow recommendation   Cervical spine epidural abscess/phlegmon c3-c5, No cord compression ,no evidence of discitis or osteomyelitis Formal Neurosurgery consult requested, case discussed with neurosurgery Dr Marcello Moores who will evaluate the patient and leave recommendation  Prevertebral edema with right-sided paravertebral fluid collections in the  mid to lower cervical spine concerning for infection and abscesses with suspected myositis involving the right-sided scalene musculature Case discussed with ENT Dr. Wilburn Cornelia who recommend  antibiotic treatment, no surgical intervention needed  Hyponatremia He is started on LR on presentation ,ivf changed to NS at 75cc/hr on 3/7,  slowly improving, repeat BMP  Hypomagnesemia Replace mag x1 on 3/6, remain low, will give another iv mag2g today, repeat lab in  the morning  DM, uncontrolled -Apparently did not believe he had DM and so didn't go back to a physician since about 2008 -DM coordinator consult -A1c is 11.3 -A.m. fasting blood glucose 253, increase Levemir to 8 units twice daily, continue SSI, continue adjust insulin  AKI UA with large hemoglobin, trace leukocyte, few bacteria Urine culture positive for MSSA ck unremarkable  Renal ultrasound no obstruction Patient slowly improved ,renal dosing meds, continue hydration  lft elevation Hepatitis panel negative  ck unremarkable  Abdominal ultrasound "Cholelithiasis. No sonographic evidence of cholecystitis or biliary ductal dilatation." Improving with treating sepsis Monitor   Microcytic anemia Hgb 9.8 ,MCV 68.8 UA showed 10-20 RBC/HPF Anemia work-up, reticulocyte count inappropriately low, B12 low normal, folate wnl, tsh wnl, iron panel pending Stool occult blood pending collection Monitor CBC  History of delusional disorder, impaired memory Mri brain" 1. No acute intracranial abnormality. 2. Mild cerebral atrophy and chronic small vessel ischemia" Currently calm, oriented x3, does has tangential thought process HIV negative,  FTT/frequent falls Fall precaution, PT eval Low normal B12, B12 supplement Neurology consulted on presentation, neurology sent neuropathy labs, B1 /B6 levels pending, methylmalonic acid level pending, please follow-up result  DVT Prophylaxis: Lovenox subcu on hold  For now in case of procedure, on scd  Code Status: full  Family Communication: sister over the phone  Disposition Plan:    Patient came from:         home                                                                                                 Anticipated d/c place: SNF  Barriers to d/c OR conditions which need to be met to effect a safe d/c:  Multiple lab abnormalities, treating bacteremia, osteomyelitis, needs TEE , is neurosurgery consult ,not medically ready to  discharge   Consultants:  ID  Neurology  Cardiology for TEE  Neurosurgery  Orthopedics  Procedures: debridement of the ulcer on the left great toe and  AMPUTATION RAY, transmetatarsal of right foot, IRRIGATION AND DEBRIDEMENT EXTREMITY, left great toe with wound VAC application on 3/6   Antibiotics:  Ancef   Objective: BP 125/68 (BP Location: Right Arm)   Pulse 91   Temp 98 F (36.7 C) (Oral)   Resp 18   Ht 5' 8"  (1.727 m)   Wt 83 kg   SpO2 92%   BMI 27.82 kg/m   Intake/Output Summary (Last 24 hours) at 02/04/2020 1642 Last data filed at 02/04/2020 1603 Gross per 24 hour  Intake 1130.91 ml  Output 2100 ml  Net -969.09 ml   Filed Weights   02/01/20 0801 02/01/20 2300  Weight: 68  kg 83 kg    Exam: Patient is examined daily including today on 02/04/2020, exams remain the same as of yesterday except that has changed    General:  Alert and calm, AAOx3, tangential thought process  Cardiovascular: RRR  Respiratory: CTABL  Abdomen: Soft/ND/NT, positive BS  Musculoskeletal: +finger deformity , partial right foot amputation with wound vac attached, left great toe dressing intact. Neuro: alert, oriented ,tangential thought process, impaired memory  Data Reviewed: Basic Metabolic Panel: Recent Labs  Lab 02/01/20 0805 02/02/20 0734 02/03/20 0323 02/04/20 0410  NA 120* 121* 124* 126*  K 4.2 4.0 4.1 4.5  CL 85* 90* 93* 92*  CO2 19* 19* 21* 24  GLUCOSE 316* 271* 225* 253*  BUN 42* 34* 31* 24*  CREATININE 2.33* 1.68* 1.57* 1.53*  CALCIUM 8.6* 8.0* 7.8* 8.1*  MG  --   --  1.5* 2.0   Liver Function Tests: Recent Labs  Lab 02/01/20 0805 02/03/20 0323 02/04/20 0410  AST 75* 43* 49*  ALT 109* 41 27  ALKPHOS 317* 258* 239*  BILITOT 1.2 0.5 0.6  PROT 8.2* 6.1* 6.4*  ALBUMIN 2.3* 1.6* 1.8*   Recent Labs  Lab 02/03/20 0323  LIPASE 30   Recent Labs  Lab 02/02/20 1131  AMMONIA 18   CBC: Recent Labs  Lab 02/01/20 0805 02/01/20 0930  02/02/20 0734 02/03/20 0323 02/04/20 0410  WBC 24.6*  --  18.4* 16.0* 14.7*  NEUTROABS  --  19.3*  --  12.8* 11.2*  HGB 11.7*  --  9.8* 9.2* 9.3*  HCT 36.5*  --  30.2* 27.5* 29.1*  MCV 70.5*  --  68.8* 67.4* 71.0*  PLT 441*  --  303 314 375   Cardiac Enzymes:   Recent Labs  Lab 02/01/20 1551 02/03/20 0323  CKTOTAL 179 62   BNP (last 3 results) No results for input(s): BNP in the last 8760 hours.  ProBNP (last 3 results) No results for input(s): PROBNP in the last 8760 hours.  CBG: Recent Labs  Lab 02/03/20 1044 02/03/20 1631 02/03/20 2201 02/04/20 0652 02/04/20 1609  GLUCAP 207* 181* 209* 196* 246*    Recent Results (from the past 240 hour(s))  Culture, blood (routine x 2)     Status: Abnormal   Collection Time: 02/01/20  9:20 AM   Specimen: BLOOD LEFT ARM  Result Value Ref Range Status   Specimen Description BLOOD LEFT ARM  Final   Special Requests   Final    BOTTLES DRAWN AEROBIC AND ANAEROBIC Blood Culture adequate volume   Culture  Setup Time   Final    IN BOTH AEROBIC AND ANAEROBIC BOTTLES GRAM POSITIVE COCCI IN CLUSTERS CRITICAL RESULT CALLED TO, READ BACK BY AND VERIFIED WITH: Diona Browner PHARMD 02/01/20 2244 JDW Performed at [facility], [place] [address] [place], [place] [address]    Culture STAPHYLOCOCCUS AUREUS (A)  Final   Report Status 02/04/2020 FINAL  Final   Organism ID, Bacteria STAPHYLOCOCCUS AUREUS  Final      Susceptibility   Staphylococcus aureus - MIC*    CIPROFLOXACIN <=0.5 SENSITIVE Sensitive     ERYTHROMYCIN <=0.25 SENSITIVE Sensitive     GENTAMICIN <=0.5 SENSITIVE Sensitive     OXACILLIN <=0.25 SENSITIVE Sensitive     TETRACYCLINE <=1 SENSITIVE Sensitive     VANCOMYCIN 1 SENSITIVE Sensitive     TRIMETH/SULFA <=10 SENSITIVE Sensitive     CLINDAMYCIN <=0.25 SENSITIVE Sensitive     RIFAMPIN <=0.5 SENSITIVE Sensitive     Inducible  Clindamycin NEGATIVE Sensitive     * STAPHYLOCOCCUS AUREUS  Culture, blood (routine x 2)     Status:  Abnormal   Collection Time: 02/01/20  9:30 AM   Specimen: BLOOD LEFT HAND  Result Value Ref Range Status   Specimen Description BLOOD LEFT HAND  Final   Special Requests   Final    BOTTLES DRAWN AEROBIC ONLY Blood Culture results may not be optimal due to an inadequate volume of blood received in culture bottles   Culture  Setup Time   Final    AEROBIC BOTTLE ONLY GRAM POSITIVE COCCI IN CLUSTERS Organism ID to follow CRITICAL RESULT CALLED TO, READ BACK BY AND VERIFIED WITH: V BRYK PHARMD 02/02/20 0213 JDW    Culture (A)  Final    STAPHYLOCOCCUS AUREUS SUSCEPTIBILITIES PERFORMED ON PREVIOUS CULTURE WITHIN THE LAST 5 DAYS. Performed at [facility], [place] [address], [place], [place] [address]    Report Status 02/04/2020 FINAL  Final  Blood Culture ID Panel (Reflexed)     Status: Abnormal   Collection Time: 02/01/20  9:30 AM  Result Value Ref Range Status   Enterococcus species NOT DETECTED NOT DETECTED Final   Listeria monocytogenes NOT DETECTED NOT DETECTED Final   Staphylococcus species DETECTED (A) NOT DETECTED Final    Comment: CRITICAL RESULT CALLED TO, READ BACK BY AND VERIFIED WITH: V BRYK PHARMD 02/02/20 0213 JDW    Staphylococcus aureus (BCID) DETECTED (A) NOT DETECTED Final    Comment: Methicillin (oxacillin) susceptible Staphylococcus aureus (MSSA). Preferred therapy is anti staphylococcal beta lactam antibiotic (Cefazolin or Nafcillin), unless clinically contraindicated. CRITICAL RESULT CALLED TO, READ BACK BY AND VERIFIED WITH: V BRYK PHARMD 02/02/20 0213 JDW    Methicillin resistance NOT DETECTED NOT DETECTED Final   Streptococcus species NOT DETECTED NOT DETECTED Final   Streptococcus agalactiae NOT DETECTED NOT DETECTED Final   Streptococcus pneumoniae NOT DETECTED NOT DETECTED Final   Streptococcus pyogenes NOT DETECTED NOT DETECTED Final   Acinetobacter baumannii NOT DETECTED NOT DETECTED Final   Enterobacteriaceae species NOT DETECTED NOT DETECTED Final    Enterobacter cloacae complex NOT DETECTED NOT DETECTED Final   Escherichia coli NOT DETECTED NOT DETECTED Final   Klebsiella oxytoca NOT DETECTED NOT DETECTED Final   Klebsiella pneumoniae NOT DETECTED NOT DETECTED Final   Proteus species NOT DETECTED NOT DETECTED Final   Serratia marcescens NOT DETECTED NOT DETECTED Final   Haemophilus influenzae NOT DETECTED NOT DETECTED Final   Neisseria meningitidis NOT DETECTED NOT DETECTED Final   Pseudomonas aeruginosa NOT DETECTED NOT DETECTED Final   Candida albicans NOT DETECTED NOT DETECTED Final   Candida glabrata NOT DETECTED NOT DETECTED Final   Candida krusei NOT DETECTED NOT DETECTED Final   Candida parapsilosis NOT DETECTED NOT DETECTED Final   Candida tropicalis NOT DETECTED NOT DETECTED Final    Comment: Performed at [facility], [place] [address] [place], [place] [address]  SARS CORONAVIRUS 2 (TAT 6-24 HRS) Nasopharyngeal Nasopharyngeal Swab     Status: None   Collection Time: 02/01/20 10:26 AM   Specimen: Nasopharyngeal Swab  Result Value Ref Range Status   SARS Coronavirus 2 NEGATIVE NEGATIVE Final    Comment: (NOTE) SARS-CoV-2 target nucleic acids are NOT DETECTED. The SARS-CoV-2 RNA is generally detectable in upper and lower respiratory specimens during the acute phase of infection. Negative results do not preclude SARS-CoV-2 infection, do not rule out co-infections with other pathogens, and should not be used as the sole basis for treatment or  other patient management decisions. Negative results must be combined with clinical observations, patient history, and epidemiological information. The expected result is Negative. Fact Sheet for Patients: SugarRoll.be Fact Sheet for Healthcare Providers: https://www.woods-mathews.com/ This test is not yet approved or cleared by the [place] FDA and  has been authorized for detection and/or diagnosis of SARS-CoV-2 by FDA under an  Emergency Use Authorization (EUA). This EUA will remain  in effect (meaning this test can be used) for the duration of the COVID-19 declaration under Section 56 4(b)(1) of the Act, 21 U.S.C. section 360bbb-3(b)(1), unless the authorization is terminated or revoked sooner. Performed at [facility], [place] [address] [place], [place] [address]   Urine culture     Status: Abnormal   Collection Time: 02/01/20  3:51 PM   Specimen: In/Out Cath Urine  Result Value Ref Range Status   Specimen Description IN/OUT CATH URINE  Final   Special Requests   Final    NONE Performed at [facility], [place] [address] [place], [place] [address]    Culture 50,000 COLONIES/mL STAPHYLOCOCCUS AUREUS (A)  Final   Report Status 02/03/2020 FINAL  Final   Organism ID, Bacteria STAPHYLOCOCCUS AUREUS (A)  Final      Susceptibility   Staphylococcus aureus - MIC*    CIPROFLOXACIN <=0.5 SENSITIVE Sensitive     GENTAMICIN <=0.5 SENSITIVE Sensitive     NITROFURANTOIN <=16 SENSITIVE Sensitive     OXACILLIN <=0.25 SENSITIVE Sensitive     TETRACYCLINE <=1 SENSITIVE Sensitive     VANCOMYCIN 1 SENSITIVE Sensitive     TRIMETH/SULFA <=10 SENSITIVE Sensitive     CLINDAMYCIN <=0.25 SENSITIVE Sensitive     RIFAMPIN <=0.5 SENSITIVE Sensitive     Inducible Clindamycin NEGATIVE Sensitive     * 50,000 COLONIES/mL STAPHYLOCOCCUS AUREUS  MRSA PCR Screening     Status: None   Collection Time: 02/02/20  9:37 AM   Specimen: Nasal Mucosa; Nasopharyngeal  Result Value Ref Range Status   MRSA by PCR NEGATIVE NEGATIVE Final    Comment:        The GeneXpert MRSA Assay (FDA approved for NASAL specimens only), is one component of a comprehensive MRSA colonization surveillance program. It is not intended to diagnose MRSA infection nor to guide or monitor treatment for MRSA infections. Performed at [facility], [place] [address] [place], [place] [address]   Surgical pcr screen     Status: Abnormal   Collection  Time: 02/02/20 10:41 PM   Specimen: Nasal Mucosa; Nasal Swab  Result Value Ref Range Status   MRSA, PCR NEGATIVE NEGATIVE Final   Staphylococcus aureus POSITIVE (A) NEGATIVE Final    Comment: (NOTE) The Xpert SA Assay (FDA approved for NASAL specimens in patients 42 years of age and older), is one component of a comprehensive surveillance program. It is not intended to diagnose infection nor to guide or monitor treatment. Performed at [facility], [place] [address], [place], [place] [address]   Culture, blood (routine x 2)     Status: None (Preliminary result)   Collection Time: 02/03/20  2:24 PM   Specimen: BLOOD  Result Value Ref Range Status   Specimen Description BLOOD LEFT ARM  Final   Special Requests   Final    BOTTLES DRAWN AEROBIC AND ANAEROBIC Blood Culture adequate volume   Culture   Final    NO GROWTH < 24 HOURS Performed at [facility], [place] [address] [place], [place] [address]  Report Status PENDING  Incomplete  Culture, blood (routine x 2)     Status: None (Preliminary result)   Collection Time: 02/03/20  2:26 PM   Specimen: BLOOD  Result Value Ref Range Status   Specimen Description BLOOD LEFT HAND  Final   Special Requests   Final    BOTTLES DRAWN AEROBIC AND ANAEROBIC Blood Culture adequate volume   Culture   Final    NO GROWTH < 24 HOURS Performed at [facility], [address], [place], [place] [address]    Report Status PENDING  Incomplete     Studies: No results found.  Scheduled Meds: . Chlorhexidine Gluconate Cloth  6 each Topical Daily  . feeding supplement (GLUCERNA SHAKE)  237 mL Oral TID BM  . insulin aspart  0-15 Units Subcutaneous TID WC  . insulin aspart  0-5 Units Subcutaneous QHS  . insulin detemir  8 Units Subcutaneous BID  . multivitamin with minerals  1 tablet Oral Daily  . mupirocin ointment  1 application Nasal BID  . senna-docusate  1 tablet Oral BID  . sodium chloride flush  3 mL Intravenous Q12H  .  vitamin B-12  1,000 mcg Oral Daily    Continuous Infusions: . sodium chloride 10 mL/hr at 02/03/20 1146  . sodium chloride 75 mL/hr at 02/04/20 1254  . sodium chloride 20 mL/hr at 02/04/20 1530  .  ceFAZolin (ANCEF) IV 2 g (02/04/20 1526)  . lactated ringers Stopped (02/03/20 1146)  . methocarbamol (ROBAXIN) IV       Time spent: 51mns,  I have personally reviewed and interpreted on  02/04/2020 daily labs,  imagings as discussed above under date review session and assessment and plans.  I reviewed all nursing notes, pharmacy notes, consultant notes,  vitals, pertinent old records  I have discussed plan of care as described above with RN , patient  on 02/04/2020   FFlorencia ReasonsMD, PhD, FACP  [facility]  Available via Epic secure chat 7am-7pm for nonurgent issues Please page for urgent issues, pager number available through [email or web address] .   02/04/2020, 4:42 PM  LOS: 3 days

## 2020-02-04 NOTE — Progress Notes (Signed)
Temp 100.3-gave tylenol-see MAR. Sister (POA) at bedside. RN reviewed consent and patient signed. MD paged to give sister a call to review plan of care. MD called room phone and spoke with patients sister.

## 2020-02-04 NOTE — Progress Notes (Addendum)
PROGRESS NOTE  Chris Bowman CBS:[id number] DOB: 11-Jul-1961 DOA: 02/01/2020 PCP: Patient, No Pcp Per  PCP: Patient, No Pcp Per - would [facility]; last saw a doctor in maybe 2008 Consultants:  None Patient coming from:  Home - lives alone; NOK: Heide Scales, [phone]  Chief Complaint: weakness, falls  HPI: Chris Bowman is a 59 y.o. male with unknown medical history presenting with weakness and falls.  He reports that he started having weak spells 2 weeks ago.  He doesn't like to go outside in the weather since he doesn't drive and can't afford to pay a taxi.  He has had "this great limitation on my range of movement", chronic buttocks pain since the falls (improving).  He also noticed R arm pain.  He thought this might be related to excessive portion of steak with A1c sauce from Friday to Saturday morning prior to onset of symptoms.  He decided to eliminate sugar from his diet.  He had been diagnosed with DM in 2008 or so but didn't think he needed medicine for it - "I mean they gave me a few insulin shots on a couple of appointments but I didn't think I needed to go whole hog with every day insulin shots."  He has had fever at home maybe 2 weeks ago.  Significant cough with SOB.  No urinary symptoms.  No recent n/v/d.  No abdominal pain.  He was told in the past that he could have a mental health problem, possibly a delusional disorder, doesn't think it is schizophrenia.  He does not think he still struggles with this, "once I got out of control from my mother and sisters and ex-wife, I think I ... kicked that habit."  Reports h/o taking too many baby ASA and having his stomach pumped and "I have a little incision scar from that."    ED Course:  2 weeks of generalized weakness.  Rectal fever, ?source.  Falls, can't get up.  BD COVID negative.  WBC 24, UA pending.  Glucose 300.  A1c is 11.  Does not see a doctor despite h/o DM. Na++ 120.  Sepsis, ?COVID,  ?maybe a source somewhere.     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 ([place])   Hyponatremia   Renal insufficiency   Weakness   Falls frequently   Osteomyelitis of foot, right, acute (HCC)  Sepsis /M SSA bacteremia/bilateral foot osteomyelitis/MSSAUTI He is started on Ancef Leukocytosis WBC 24.6 on presentation, down to 14.7 this morning will proceed with TEE to rule out endocarditis, cardiology made aware Repeat blood culture  on 3/6, no growth ID /orthoconsult/cardiology input appreciated  bilateral foot osteomyelitis debridement of the ulcer on the left great toe S/p AMPUTATION RAY, transmetatarsal of right foot, IRRIGATION AND DEBRIDEMENT EXTREMITY, left great toe with wound VAC application on 3/6 Orthopedic Dr. Sharol Given input appreciated, will follow recommendation   Cervical spine epidural abscess/phlegmon c3-c5, No cord compression ,no evidence of discitis or osteomyelitis Formal Neurosurgery consult requested, case discussed with neurosurgery Dr Marcello Moores who will evaluate the patient and leave recommendation  Prevertebral edema with right-sided paravertebral fluid collections in the  mid to lower cervical spine concerning for infection and abscesses with suspected myositis involving the right-sided scalene musculature Case discussed with ENT Dr. Wilburn Cornelia who recommend  antibiotic treatment, no surgical intervention needed  Hyponatremia He is started on LR on presentation ,ivf changed to NS at 75cc/hr on 3/7,  slowly improving, repeat BMP  Hypomagnesemia Replace mag x1 on 3/6, remain low, will give another iv mag2g today, repeat lab in  the morning  DM, uncontrolled -Apparently did not believe he had DM and so didn't go back to a physician since about 2008 -DM coordinator consult -A1c is 11.3 -A.m. fasting blood glucose 253, increase Levemir to 8 units twice daily, continue SSI, continue adjust insulin  AKI UA with large hemoglobin, trace leukocyte, few bacteria Urine culture positive for MSSA ck unremarkable  Renal ultrasound no obstruction Patient slowly improved ,renal dosing meds, continue hydration  lft elevation Hepatitis panel negative  ck unremarkable  Abdominal ultrasound "Cholelithiasis. No sonographic evidence of cholecystitis or biliary ductal dilatation." Improving with treating sepsis Monitor   Microcytic anemia Hgb 9.8 ,MCV 68.8 UA showed 10-20 RBC/HPF Anemia work-up, reticulocyte count inappropriately low, B12 low normal, folate wnl, tsh wnl, iron panel pending Stool occult blood pending collection Monitor CBC  History of delusional disorder, impaired memory Mri brain" 1. No acute intracranial abnormality. 2. Mild cerebral atrophy and chronic small vessel ischemia" Currently calm, oriented x3, does has tangential thought process HIV negative,  FTT/frequent falls Fall precaution, PT eval Low normal B12, B12 supplement Neurology consulted on presentation, neurology sent neuropathy labs, B1 /B6 levels pending, methylmalonic acid level pending, please follow-up result  DVT Prophylaxis: Lovenox subcu on hold  For now in case of procedure, on scd  Code Status: full  Family Communication: sister over the phone  Disposition Plan:    Patient came from:         home                                                                                                 Anticipated d/c place: SNF  Barriers to d/c OR conditions which need to be met to effect a safe d/c:  Multiple lab abnormalities, treating bacteremia, osteomyelitis, needs TEE , is neurosurgery consult ,not medically ready to  discharge   Consultants:  ID  Neurology  Cardiology for TEE  Neurosurgery  Orthopedics  Procedures: debridement of the ulcer on the left great toe and  AMPUTATION RAY, transmetatarsal of right foot, IRRIGATION AND DEBRIDEMENT EXTREMITY, left great toe with wound VAC application on 3/6   Antibiotics:  Ancef   Objective: BP 125/68 (BP Location: Right Arm)   Pulse 91   Temp 98 F (36.7 C) (Oral)   Resp 18   Ht 5' 8"  (1.727 m)   Wt 83 kg   SpO2 92%   BMI 27.82 kg/m   Intake/Output Summary (Last 24 hours) at 02/04/2020 1642 Last data filed at 02/04/2020 1603 Gross per 24 hour  Intake 1130.91 ml  Output 2100 ml  Net -969.09 ml   Filed Weights   02/01/20 0801 02/01/20 2300  Weight: 68  kg 83 kg    Exam: Patient is examined daily including today on 02/04/2020, exams remain the same as of yesterday except that has changed    General:  Alert and calm, AAOx3, tangential thought process  Cardiovascular: RRR  Respiratory: CTABL  Abdomen: Soft/ND/NT, positive BS  Musculoskeletal: +finger deformity , partial right foot amputation with wound vac attached, left great toe dressing intact. Neuro: alert, oriented ,tangential thought process, impaired memory  Data Reviewed: Basic Metabolic Panel: Recent Labs  Lab 02/01/20 0805 02/02/20 0734 02/03/20 0323 02/04/20 0410  NA 120* 121* 124* 126*  K 4.2 4.0 4.1 4.5  CL 85* 90* 93* 92*  CO2 19* 19* 21* 24  GLUCOSE 316* 271* 225* 253*  BUN 42* 34* 31* 24*  CREATININE 2.33* 1.68* 1.57* 1.53*  CALCIUM 8.6* 8.0* 7.8* 8.1*  MG  --   --  1.5* 2.0   Liver Function Tests: Recent Labs  Lab 02/01/20 0805 02/03/20 0323 02/04/20 0410  AST 75* 43* 49*  ALT 109* 41 27  ALKPHOS 317* 258* 239*  BILITOT 1.2 0.5 0.6  PROT 8.2* 6.1* 6.4*  ALBUMIN 2.3* 1.6* 1.8*   Recent Labs  Lab 02/03/20 0323  LIPASE 30   Recent Labs  Lab 02/02/20 1131  AMMONIA 18   CBC: Recent Labs  Lab 02/01/20 0805 02/01/20 0930  02/02/20 0734 02/03/20 0323 02/04/20 0410  WBC 24.6*  --  18.4* 16.0* 14.7*  NEUTROABS  --  19.3*  --  12.8* 11.2*  HGB 11.7*  --  9.8* 9.2* 9.3*  HCT 36.5*  --  30.2* 27.5* 29.1*  MCV 70.5*  --  68.8* 67.4* 71.0*  PLT 441*  --  303 314 375   Cardiac Enzymes:   Recent Labs  Lab 02/01/20 1551 02/03/20 0323  CKTOTAL 179 62   BNP (last 3 results) No results for input(s): BNP in the last 8760 hours.  ProBNP (last 3 results) No results for input(s): PROBNP in the last 8760 hours.  CBG: Recent Labs  Lab 02/03/20 1044 02/03/20 1631 02/03/20 2201 02/04/20 0652 02/04/20 1609  GLUCAP 207* 181* 209* 196* 246*    Recent Results (from the past 240 hour(s))  Culture, blood (routine x 2)     Status: Abnormal   Collection Time: 02/01/20  9:20 AM   Specimen: BLOOD LEFT ARM  Result Value Ref Range Status   Specimen Description BLOOD LEFT ARM  Final   Special Requests   Final    BOTTLES DRAWN AEROBIC AND ANAEROBIC Blood Culture adequate volume   Culture  Setup Time   Final    IN BOTH AEROBIC AND ANAEROBIC BOTTLES GRAM POSITIVE COCCI IN CLUSTERS CRITICAL RESULT CALLED TO, READ BACK BY AND VERIFIED WITH: Diona Browner PHARMD 02/01/20 2244 JDW Performed at [facility], [place] [address], [place], [place] [address]    Culture STAPHYLOCOCCUS AUREUS (A)  Final   Report Status 02/04/2020 FINAL  Final   Organism ID, Bacteria STAPHYLOCOCCUS AUREUS  Final      Susceptibility   Staphylococcus aureus - MIC*    CIPROFLOXACIN <=0.5 SENSITIVE Sensitive     ERYTHROMYCIN <=0.25 SENSITIVE Sensitive     GENTAMICIN <=0.5 SENSITIVE Sensitive     OXACILLIN <=0.25 SENSITIVE Sensitive     TETRACYCLINE <=1 SENSITIVE Sensitive     VANCOMYCIN 1 SENSITIVE Sensitive     TRIMETH/SULFA <=10 SENSITIVE Sensitive     CLINDAMYCIN <=0.25 SENSITIVE Sensitive     RIFAMPIN <=0.5 SENSITIVE Sensitive     Inducible  Clindamycin NEGATIVE Sensitive     * STAPHYLOCOCCUS AUREUS  Culture, blood (routine x 2)     Status:  Abnormal   Collection Time: 02/01/20  9:30 AM   Specimen: BLOOD LEFT HAND  Result Value Ref Range Status   Specimen Description BLOOD LEFT HAND  Final   Special Requests   Final    BOTTLES DRAWN AEROBIC ONLY Blood Culture results may not be optimal due to an inadequate volume of blood received in culture bottles   Culture  Setup Time   Final    AEROBIC BOTTLE ONLY GRAM POSITIVE COCCI IN CLUSTERS Organism ID to follow CRITICAL RESULT CALLED TO, READ BACK BY AND VERIFIED WITH: V BRYK PHARMD 02/02/20 0213 JDW    Culture (A)  Final    STAPHYLOCOCCUS AUREUS SUSCEPTIBILITIES PERFORMED ON PREVIOUS CULTURE WITHIN THE LAST 5 DAYS. Performed at [facility], [place] [address], [place], [place] [address]    Report Status 02/04/2020 FINAL  Final  Blood Culture ID Panel (Reflexed)     Status: Abnormal   Collection Time: 02/01/20  9:30 AM  Result Value Ref Range Status   Enterococcus species NOT DETECTED NOT DETECTED Final   Listeria monocytogenes NOT DETECTED NOT DETECTED Final   Staphylococcus species DETECTED (A) NOT DETECTED Final    Comment: CRITICAL RESULT CALLED TO, READ BACK BY AND VERIFIED WITH: V BRYK PHARMD 02/02/20 0213 JDW    Staphylococcus aureus (BCID) DETECTED (A) NOT DETECTED Final    Comment: Methicillin (oxacillin) susceptible Staphylococcus aureus (MSSA). Preferred therapy is anti staphylococcal beta lactam antibiotic (Cefazolin or Nafcillin), unless clinically contraindicated. CRITICAL RESULT CALLED TO, READ BACK BY AND VERIFIED WITH: V BRYK PHARMD 02/02/20 0213 JDW    Methicillin resistance NOT DETECTED NOT DETECTED Final   Streptococcus species NOT DETECTED NOT DETECTED Final   Streptococcus agalactiae NOT DETECTED NOT DETECTED Final   Streptococcus pneumoniae NOT DETECTED NOT DETECTED Final   Streptococcus pyogenes NOT DETECTED NOT DETECTED Final   Acinetobacter baumannii NOT DETECTED NOT DETECTED Final   Enterobacteriaceae species NOT DETECTED NOT DETECTED Final    Enterobacter cloacae complex NOT DETECTED NOT DETECTED Final   Escherichia coli NOT DETECTED NOT DETECTED Final   Klebsiella oxytoca NOT DETECTED NOT DETECTED Final   Klebsiella pneumoniae NOT DETECTED NOT DETECTED Final   Proteus species NOT DETECTED NOT DETECTED Final   Serratia marcescens NOT DETECTED NOT DETECTED Final   Haemophilus influenzae NOT DETECTED NOT DETECTED Final   Neisseria meningitidis NOT DETECTED NOT DETECTED Final   Pseudomonas aeruginosa NOT DETECTED NOT DETECTED Final   Candida albicans NOT DETECTED NOT DETECTED Final   Candida glabrata NOT DETECTED NOT DETECTED Final   Candida krusei NOT DETECTED NOT DETECTED Final   Candida parapsilosis NOT DETECTED NOT DETECTED Final   Candida tropicalis NOT DETECTED NOT DETECTED Final    Comment: Performed at [facility], [place] [address], [place], [place] [address]  SARS CORONAVIRUS 2 (TAT 6-24 HRS) Nasopharyngeal Nasopharyngeal Swab     Status: None   Collection Time: 02/01/20 10:26 AM   Specimen: Nasopharyngeal Swab  Result Value Ref Range Status   SARS Coronavirus 2 NEGATIVE NEGATIVE Final    Comment: (NOTE) SARS-CoV-2 target nucleic acids are NOT DETECTED. The SARS-CoV-2 RNA is generally detectable in upper and lower respiratory specimens during the acute phase of infection. Negative results do not preclude SARS-CoV-2 infection, do not rule out co-infections with other pathogens, and should not be used as the sole basis for treatment or  other patient management decisions. Negative results must be combined with clinical observations, patient history, and epidemiological information. The expected result is Negative. Fact Sheet for Patients: SugarRoll.be Fact Sheet for Healthcare Providers: https://www.woods-mathews.com/ This test is not yet approved or cleared by the [place] FDA and  has been authorized for detection and/or diagnosis of SARS-CoV-2 by FDA under an  Emergency Use Authorization (EUA). This EUA will remain  in effect (meaning this test can be used) for the duration of the COVID-19 declaration under Section 56 4(b)(1) of the Act, 21 U.S.C. section 360bbb-3(b)(1), unless the authorization is terminated or revoked sooner. Performed at [facility], [place] [address], [place], [place] [address]   Urine culture     Status: Abnormal   Collection Time: 02/01/20  3:51 PM   Specimen: In/Out Cath Urine  Result Value Ref Range Status   Specimen Description IN/OUT CATH URINE  Final   Special Requests   Final    NONE Performed at [facility], [place] [address] [place], [place] [address]    Culture 50,000 COLONIES/mL STAPHYLOCOCCUS AUREUS (A)  Final   Report Status 02/03/2020 FINAL  Final   Organism ID, Bacteria STAPHYLOCOCCUS AUREUS (A)  Final      Susceptibility   Staphylococcus aureus - MIC*    CIPROFLOXACIN <=0.5 SENSITIVE Sensitive     GENTAMICIN <=0.5 SENSITIVE Sensitive     NITROFURANTOIN <=16 SENSITIVE Sensitive     OXACILLIN <=0.25 SENSITIVE Sensitive     TETRACYCLINE <=1 SENSITIVE Sensitive     VANCOMYCIN 1 SENSITIVE Sensitive     TRIMETH/SULFA <=10 SENSITIVE Sensitive     CLINDAMYCIN <=0.25 SENSITIVE Sensitive     RIFAMPIN <=0.5 SENSITIVE Sensitive     Inducible Clindamycin NEGATIVE Sensitive     * 50,000 COLONIES/mL STAPHYLOCOCCUS AUREUS  MRSA PCR Screening     Status: None   Collection Time: 02/02/20  9:37 AM   Specimen: Nasal Mucosa; Nasopharyngeal  Result Value Ref Range Status   MRSA by PCR NEGATIVE NEGATIVE Final    Comment:        The GeneXpert MRSA Assay (FDA approved for NASAL specimens only), is one component of a comprehensive MRSA colonization surveillance program. It is not intended to diagnose MRSA infection nor to guide or monitor treatment for MRSA infections. Performed at [facility], [place] [address], [place], [place] [address]   Surgical pcr screen     Status: Abnormal   Collection  Time: 02/02/20 10:41 PM   Specimen: Nasal Mucosa; Nasal Swab  Result Value Ref Range Status   MRSA, PCR NEGATIVE NEGATIVE Final   Staphylococcus aureus POSITIVE (A) NEGATIVE Final    Comment: (NOTE) The Xpert SA Assay (FDA approved for NASAL specimens in patients 41 years of age and older), is one component of a comprehensive surveillance program. It is not intended to diagnose infection nor to guide or monitor treatment. Performed at [facility], [place] [address] [place], [place] [address]   Culture, blood (routine x 2)     Status: None (Preliminary result)   Collection Time: 02/03/20  2:24 PM   Specimen: BLOOD  Result Value Ref Range Status   Specimen Description BLOOD LEFT ARM  Final   Special Requests   Final    BOTTLES DRAWN AEROBIC AND ANAEROBIC Blood Culture adequate volume   Culture   Final    NO GROWTH < 24 HOURS Performed at [facility], [place] [address], [place], [place] [address]  Report Status PENDING  Incomplete  Culture, blood (routine x 2)     Status: None (Preliminary result)   Collection Time: 02/03/20  2:26 PM   Specimen: BLOOD  Result Value Ref Range Status   Specimen Description BLOOD LEFT HAND  Final   Special Requests   Final    BOTTLES DRAWN AEROBIC AND ANAEROBIC Blood Culture adequate volume   Culture   Final    NO GROWTH < 24 HOURS Performed at [facility], [address], [place], [place] [address]    Report Status PENDING  Incomplete     Studies: No results found.  Scheduled Meds: . Chlorhexidine Gluconate Cloth  6 each Topical Daily  . feeding supplement (GLUCERNA SHAKE)  237 mL Oral TID BM  . insulin aspart  0-15 Units Subcutaneous TID WC  . insulin aspart  0-5 Units Subcutaneous QHS  . insulin detemir  8 Units Subcutaneous BID  . multivitamin with minerals  1 tablet Oral Daily  . mupirocin ointment  1 application Nasal BID  . senna-docusate  1 tablet Oral BID  . sodium chloride flush  3 mL Intravenous Q12H  .  vitamin B-12  1,000 mcg Oral Daily    Continuous Infusions: . sodium chloride 10 mL/hr at 02/03/20 1146  . sodium chloride 75 mL/hr at 02/04/20 1254  . sodium chloride 20 mL/hr at 02/04/20 1530  .  ceFAZolin (ANCEF) IV 2 g (02/04/20 1526)  . lactated ringers Stopped (02/03/20 1146)  . methocarbamol (ROBAXIN) IV       Time spent: 77mns,  I have personally reviewed and interpreted on  02/04/2020 daily labs,  imagings as discussed above under date review session and assessment and plans.  I reviewed all nursing notes, pharmacy notes, consultant notes,  vitals, pertinent old records  I have discussed plan of care as described above with RN , patient  on 02/04/2020   FFlorencia ReasonsMD, PhD, FACP  [facility]  Available via Epic secure chat 7am-7pm for nonurgent issues Please page for urgent issues, pager number available through [email or web address] .   02/04/2020, 4:42 PM  LOS: 3 days

## 2020-02-05 ENCOUNTER — Encounter (HOSPITAL_COMMUNITY)
Admission: EM | Disposition: A | Discharge status: Skilled Nursing Facility | Payer: Self-pay | Source: Home / Self Care | Attending: Internal Medicine

## 2020-02-05 ENCOUNTER — Inpatient Hospital Stay (HOSPITAL_COMMUNITY): Payer: Non-veteran care

## 2020-02-05 ENCOUNTER — Inpatient Hospital Stay (HOSPITAL_COMMUNITY): Payer: Non-veteran care | Admitting: Anesthesiology

## 2020-02-05 ENCOUNTER — Encounter (HOSPITAL_COMMUNITY): Payer: Self-pay | Admitting: Internal Medicine

## 2020-02-05 DIAGNOSIS — N289 Disorder of kidney and ureter, unspecified: Secondary | ICD-10-CM

## 2020-02-05 DIAGNOSIS — R531 Weakness: Secondary | ICD-10-CM

## 2020-02-05 DIAGNOSIS — Z978 Presence of other specified devices: Secondary | ICD-10-CM

## 2020-02-05 DIAGNOSIS — I340 Nonrheumatic mitral (valve) insufficiency: Secondary | ICD-10-CM

## 2020-02-05 HISTORY — PX: TEE WITHOUT CARDIOVERSION: SHX5443

## 2020-02-05 LAB — CBC WITH DIFFERENTIAL/PLATELET
Abs Immature Granulocytes: 0.09 10*3/uL — ABNORMAL HIGH (ref 0.00–0.07)
Basophils Absolute: 0.1 10*3/uL (ref 0.0–0.1)
Basophils Relative: 1 %
Eosinophils Absolute: 0.1 10*3/uL (ref 0.0–0.5)
Eosinophils Relative: 1 %
HCT: 31.3 % — ABNORMAL LOW (ref 39.0–52.0)
Hemoglobin: 9.9 g/dL — ABNORMAL LOW (ref 13.0–17.0)
Immature Granulocytes: 1 %
Lymphocytes Relative: 13 %
Lymphs Abs: 1.6 10*3/uL (ref 0.7–4.0)
MCH: 22.7 pg — ABNORMAL LOW (ref 26.0–34.0)
MCHC: 31.6 g/dL (ref 30.0–36.0)
MCV: 71.6 fL — ABNORMAL LOW (ref 80.0–100.0)
Monocytes Absolute: 1 10*3/uL (ref 0.1–1.0)
Monocytes Relative: 8 %
Neutro Abs: 9.8 10*3/uL — ABNORMAL HIGH (ref 1.7–7.7)
Neutrophils Relative %: 76 %
Platelets: 404 10*3/uL — ABNORMAL HIGH (ref 150–400)
RBC: 4.37 MIL/uL (ref 4.22–5.81)
RDW: 15.2 % (ref 11.5–15.5)
WBC: 12.8 10*3/uL — ABNORMAL HIGH (ref 4.0–10.5)
nRBC: 0 % (ref 0.0–0.2)

## 2020-02-05 LAB — OSMOLALITY, URINE: Osmolality, Ur: 458 mOsm/kg (ref 300–900)

## 2020-02-05 LAB — FERRITIN: Ferritin: 543 ng/mL — ABNORMAL HIGH (ref 24–336)

## 2020-02-05 LAB — COMPREHENSIVE METABOLIC PANEL
ALT: 41 U/L (ref 0–44)
AST: 92 U/L — ABNORMAL HIGH (ref 15–41)
Albumin: 1.7 g/dL — ABNORMAL LOW (ref 3.5–5.0)
Alkaline Phosphatase: 248 U/L — ABNORMAL HIGH (ref 38–126)
Anion gap: 9 (ref 5–15)
BUN: 24 mg/dL — ABNORMAL HIGH (ref 6–20)
CO2: 24 mmol/L (ref 22–32)
Calcium: 8.2 mg/dL — ABNORMAL LOW (ref 8.9–10.3)
Chloride: 92 mmol/L — ABNORMAL LOW (ref 98–111)
Creatinine, Ser: 1.54 mg/dL — ABNORMAL HIGH (ref 0.61–1.24)
GFR calc Af Amer: 57 mL/min — ABNORMAL LOW (ref >60)
GFR calc non Af Amer: 49 mL/min — ABNORMAL LOW (ref >60)
Glucose, Bld: 264 mg/dL — ABNORMAL HIGH (ref 70–99)
Potassium: 4.7 mmol/L (ref 3.5–5.1)
Sodium: 125 mmol/L — ABNORMAL LOW (ref 135–145)
Total Bilirubin: 0.2 mg/dL — ABNORMAL LOW (ref 0.3–1.2)
Total Protein: 6.8 g/dL (ref 6.5–8.1)

## 2020-02-05 LAB — GLUCOSE, CAPILLARY
Glucose-Capillary: 264 mg/dL — ABNORMAL HIGH (ref 70–99)
Glucose-Capillary: 214 mg/dL — ABNORMAL HIGH (ref 70–99)
Glucose-Capillary: 145 mg/dL — ABNORMAL HIGH (ref 70–99)
Glucose-Capillary: 172 mg/dL — ABNORMAL HIGH (ref 70–99)
Glucose-Capillary: 164 mg/dL — ABNORMAL HIGH (ref 70–99)

## 2020-02-05 LAB — ANCA TITERS
Atypical P-ANCA titer: <1:20 {titer}
C-ANCA: <1:20 {titer}
P-ANCA: <1:20 {titer}

## 2020-02-05 LAB — IRON AND TIBC
Iron: 12 ug/dL — ABNORMAL LOW (ref 45–182)
Saturation Ratios: 7 % — ABNORMAL LOW (ref 17.9–39.5)
TIBC: 174 ug/dL — ABNORMAL LOW (ref 250–450)
UIBC: 162 ug/dL

## 2020-02-05 LAB — NA AND K (SODIUM & POTASSIUM), RAND UR
Potassium Urine: 38 mmol/L
Sodium, Ur: 66 mmol/L

## 2020-02-05 LAB — ANTINUCLEAR ANTIBODIES, IFA: ANA Ab, IFA: NEGATIVE

## 2020-02-05 LAB — MAGNESIUM: Magnesium: 2 mg/dL (ref 1.7–2.4)

## 2020-02-05 LAB — URIC ACID: Uric Acid, Serum: 4.3 mg/dL (ref 3.7–8.6)

## 2020-02-05 LAB — OSMOLALITY: Osmolality: 283 mOsm/kg (ref 275–295)

## 2020-02-05 SURGERY — ECHOCARDIOGRAM, TRANSESOPHAGEAL
Anesthesia: Monitor Anesthesia Care

## 2020-02-05 MED ORDER — LIVING WELL WITH DIABETES BOOK
Freq: Once | Status: AC
  Filled 2020-02-05: qty 1

## 2020-02-05 MED ORDER — INSULIN STARTER KIT- PEN NEEDLES (ENGLISH)
1.0000 | Freq: Once | Status: DC
  Filled 2020-02-05: qty 1

## 2020-02-05 MED ORDER — PROPOFOL 500 MG/50ML IV EMUL
INTRAVENOUS | Status: DC | PRN
  Administered 2020-02-05: 100 ug/kg/min via INTRAVENOUS

## 2020-02-05 MED ORDER — BUTAMBEN-TETRACAINE-BENZOCAINE 2-2-14 % EX AERO
INHALATION_SPRAY | CUTANEOUS | Status: DC | PRN
  Administered 2020-02-05: 2 via TOPICAL

## 2020-02-05 NOTE — Progress Notes (Signed)
Brief Nutrition Follow-up  Consult received for diet education; pt out of room procedure on visit today.  Initial Assessment completed by RD on 02/02/2020  Lab Results  Component Value Date   HGBA1C 11.3 (H) 02/01/2020   Plan to follow-up and provide diet education on follow-up  Romelle Starcher MS, RDN, LDN, CNSC RD Pager Number and Weekend/On-Call After Hours Pager Located in [place]

## 2020-02-05 NOTE — Progress Notes (Addendum)
Inpatient Diabetes Program Recommendations  AACE/ADA: New Consensus Statement on Inpatient Glycemic Control (2015)  Target Ranges:  Prepandial:   less than 140 mg/dL      Peak postprandial:   less than 180 mg/dL (1-2 hours)      Critically ill patients:  140 - 180 mg/dL   Lab Results  Component Value Date   GLUCAP 214 (H) 02/05/2020   HGBA1C 11.3 (H) 02/01/2020    Review of Glycemic Control Results for Chris Bowman, Chris Bowman (MRN [id number]) as of 02/05/2020 09:40  Ref. Range 02/04/2020 16:09 02/04/2020 21:12 02/05/2020 07:22  Glucose-Capillary Latest Ref Range: 70 - 99 mg/dL 246 (H) 163 (H) 214 (H)   Diabetes history: Type 2 DM Outpatient Diabetes medications: none Current orders for Inpatient glycemic control: Levemir 8 units BID, Novolog 0-15 units TID, Novolog 0-5 units QHS  Inpatient Diabetes Program Recommendations:    Consider increasing Levemir to 10 units BID.   Addendum: Spoke with patient regarding outpatient diabetes management. Patient reports being told he had diabetes in 2008, but "didn't think it was that serious".  Reviewed patient's current A1c of 11.3%. Explained what a A1c is and what it measures. Also reviewed goal A1c with patient, importance of good glucose control @ home, and blood sugar goals. Reviewed patho of DM, role of pancreas, need for insulin, survival skills, interventions, impact of glucose to kidney function and risk for further infection, vascular changes and commorbidites. Patient will need a meter at discharge. Blood glucose meter kit (includes lancets and strips) (#[id number]). Reviewed frequency of when to check. Patient will need easy schedule.  Patient denies drinking sugary beverages, reviewed alternatives. Also, encouraged to be mindful of foods that were higher in carbohdyrates.  Educated patient on insulin pen use at home. Reviewed contents of insulin flexpen starter kit. Reviewed all steps if insulin pen including attachment of needle, 2-unit air shot,  dialing up dose, giving injection, removing needle, disposal of sharps, storage of unused insulin, disposal of insulin etc. Patient unable to provide successful return demonstration. Rn to continue working with patient to assess ability to grow in skill. During teach back, patient did not answer correctly to correcting hypoglycemia. Question patient's ability to think critically thorugh insulin administration.  In the event patient is discharged on insulin, recommend Novolin 70/30 through [facility].  Placed order for care instruction, dietitian, insulin starter kit and living well with diabetes. Patient will need meter at discharge, however, will wait for disposition for follow up plan.  Thanks, Bronson Curb, MSN, RNC-OB Diabetes Coordinator [phone] (8a-5p)

## 2020-02-05 NOTE — Progress Notes (Signed)
Patient comfortable in bed. States he has been getting some intermittent  Shortness of breath  No chest pain.  He said nursing is aware and he has not needed any supplemental oxygen  VSS 0cc in vac. Dressing in place. Plan 1 week follow up with Dr. Lajoyce Corners. Spoke with nursing regarding his shortness of breath

## 2020-02-05 NOTE — Anesthesia Postprocedure Evaluation (Signed)
Anesthesia Post Note  Patient: Chris Bowman  Procedure(s) Performed: TRANSESOPHAGEAL ECHOCARDIOGRAM (TEE) (N/A )     Patient location during evaluation: PACU Anesthesia Type: MAC Level of consciousness: awake and alert Pain management: pain level controlled Vital Signs Assessment: post-procedure vital signs reviewed and stable Respiratory status: spontaneous breathing, nonlabored ventilation and respiratory function stable Cardiovascular status: blood pressure returned to baseline and stable Postop Assessment: no apparent nausea or vomiting Anesthetic complications: no    Last Vitals:  Vitals:   02/05/20 1350 02/05/20 1451  BP: (!) 105/53 (!) 154/70  Pulse: 84 87  Resp: 20 18  Temp:  37.5 C  SpO2: 93% 95%    Last Pain:  Vitals:   02/05/20 1451  TempSrc: Oral  PainSc:                  [name]

## 2020-02-05 NOTE — Transfer of Care (Signed)
Immediate Anesthesia Transfer of Care Note  Patient: Colbi Staubs  Procedure(s) Performed: TRANSESOPHAGEAL ECHOCARDIOGRAM (TEE) (N/A )  Patient Location: PACU and Endoscopy Unit  Anesthesia Type:MAC  Level of Consciousness: awake, alert  and oriented  Airway & Oxygen Therapy: Patient Spontanous Breathing and Patient connected to nasal cannula oxygen  Post-op Assessment: Report given to RN and Post -op Vital signs reviewed and stable  Post vital signs: Reviewed and stable  Last Vitals:  Vitals Value Taken Time  BP 92/47   Temp    Pulse 86   Resp 15   SpO2 97     Last Pain:  Vitals:   02/05/20 1208  TempSrc: Oral  PainSc: 0-No pain         Complications: No apparent anesthesia complications

## 2020-02-05 NOTE — Plan of Care (Signed)
  Problem: Education: Goal: Knowledge of General Education information will improve Description Including pain rating scale, medication(s)/side effects and non-pharmacologic comfort measures Outcome: Progressing   

## 2020-02-05 NOTE — Progress Notes (Addendum)
PROGRESS NOTE    Chris Bowman  ZOX:[id number]  DOB: 03-Nov-1961  PCP: Patient, No Pcp Per Admit date:02/01/2020  59 y.o.malewithunknownmedical history who has not seen a PCP since 2008, presented to Ed  with weakness and falls x 2 weeks associated with fevers, dyspnea and cough. He has had "this great limitation on my range of movement", chronic buttocks pain since the falls (improving). He also noticed R arm pain.He had been diagnosed with DM in 2008 but didn't think he needed medicine for it .Reports h/o taking too many baby ASA and having his stomach pumped and "I have a little incision scar from that." ED Course: Afebrile (has rectal fever), ?source. COVID negative. WBC 24, UA pending. Glucose 300. A1c is 11. Na-120.  Hospital course: Patient admitted to [facility] for further evaluation and work up. Patient has had fever spikes while hospitalized. W/U revealed MSSA UTI, bacteremia, bilateral foot osteomyelitis with associated sepsis syndrome. There is also concern for Cervical spine epidural abscess/phlegmon c3-c5, No cord compression ,no evidence of discitis or osteomyelitis Formal Neurosurgery consult requested, case discussed with neurosurgery Dr Maisie Fus. He is on Ancef. TEE to rule out endocarditis, cardiology made aware. Repeat blood culture  on 3/6, no growth. Underwent  transmetatarsal ray amputation of right foot as well as irrigation/debridement of left great toe with wound VAC application on 3/6.ID Gaylord Shih Dr Duda/cardiology input appreciated.He is started on LR on presentation ,ivf changed to NS at 75cc/hr on 3/7,  slowly improving. Sodium now improved to ~125,    Subjective:  Patient resting comfortably. Came back from TEE. He reports feeling okay , tolerating wound vac on right foot. Nurse reported urinary retention this morning requiring straight cath. Hasn't walked with PT yet.   Objective: Vitals:   02/04/20 1804 02/04/20 2107 02/04/20 2110 02/05/20 0541  BP:   139/70 (!)  150/110  Pulse:   90 92  Resp:   16 16  Temp: 99.1 F (37.3 C)  99.8 F (37.7 C) 98.1 F (36.7 C)  TempSrc: Oral  Oral Oral  SpO2:   95% 91%  Weight:  92.3 kg    Height:        Intake/Output Summary (Last 24 hours) at 02/05/2020 0846 Last data filed at 02/05/2020 0700 Gross per 24 hour  Intake 1910.91 ml  Output 4300 ml  Net -2389.09 ml   Filed Weights   02/01/20 0801 02/01/20 2300 02/04/20 2107  Weight: 68 kg 83 kg 92.3 kg    Physical Examination:  General exam: Appears calm and comfortable  Respiratory system: Clear to auscultation. Respiratory effort normal. Cardiovascular system: S1 & S2 heard, RRR. No JVD, murmurs. No pedal edema. Gastrointestinal system: Abdomen is nondistended, soft and nontender. Normal bowel sounds heard. Central nervous system: Alert and oriented. No new focal neurological deficits. Extremities: s/p transmetatarsal amputation on rt foot with dressing/wound vac in place. Skin: No rashes, lesions or ulcers Psychiatry: Judgement and insight appear normal. Mood & affect appropriate.   Data Reviewed: I have personally reviewed following labs and imaging studies  CBC: Recent Labs  Lab 02/01/20 0805 02/01/20 0930 02/02/20 0734 02/03/20 0323 02/04/20 0410 02/05/20 0500  WBC 24.6*  --  18.4* 16.0* 14.7* 12.8*  NEUTROABS  --  19.3*  --  12.8* 11.2* 9.8*  HGB 11.7*  --  9.8* 9.2* 9.3* 9.9*  HCT 36.5*  --  30.2* 27.5* 29.1* 31.3*  MCV 70.5*  --  68.8* 67.4* 71.0* 71.6*  PLT 441*  --  303 314 375 404*  Basic Metabolic Panel: Recent Labs  Lab 02/01/20 0805 02/02/20 0734 02/03/20 0323 02/04/20 0410 02/05/20 0500  NA 120* 121* 124* 126* 125*  K 4.2 4.0 4.1 4.5 4.7  CL 85* 90* 93* 92* 92*  CO2 19* 19* 21* 24 24  GLUCOSE 316* 271* 225* 253* 264*  BUN 42* 34* 31* 24* 24*  CREATININE 2.33* 1.68* 1.57* 1.53* 1.54*  CALCIUM 8.6* 8.0* 7.8* 8.1* 8.2*  MG  --   --  1.5* 2.0 2.0   GFR: Estimated Creatinine Clearance: 58.4 mL/min (A) (by C-G  formula based on SCr of 1.54 mg/dL (H)). Liver Function Tests: Recent Labs  Lab 02/01/20 0805 02/03/20 0323 02/04/20 0410 02/05/20 0500  AST 75* 43* 49* 92*  ALT 109* 41 27 41  ALKPHOS 317* 258* 239* 248*  BILITOT 1.2 0.5 0.6 0.2*  PROT 8.2* 6.1* 6.4* 6.8  ALBUMIN 2.3* 1.6* 1.8* 1.7*   Recent Labs  Lab 02/03/20 0323  LIPASE 30   Recent Labs  Lab 02/02/20 1131  AMMONIA 18   Coagulation Profile: Recent Labs  Lab 02/01/20 0930  INR 1.1   Cardiac Enzymes: Recent Labs  Lab 02/01/20 1551 02/03/20 0323  CKTOTAL 179 62   BNP (last 3 results) No results for input(s): PROBNP in the last 8760 hours. HbA1C: No results for input(s): HGBA1C in the last 72 hours. CBG: Recent Labs  Lab 02/03/20 2201 02/04/20 0652 02/04/20 1609 02/04/20 2112 02/05/20 0722  GLUCAP 209* 196* 246* 163* 214*   Lipid Profile: No results for input(s): CHOL, HDL, LDLCALC, TRIG, CHOLHDL, LDLDIRECT in the last 72 hours. Thyroid Function Tests: No results for input(s): TSH, T4TOTAL, FREET4, T3FREE, THYROIDAB in the last 72 hours. Anemia Panel: Recent Labs    02/03/20 0323 02/05/20 0500  FERRITIN  --  543*  TIBC  --  174*  IRON  --  12*  RETICCTPCT 0.7  --    Sepsis Labs: Recent Labs  Lab 02/01/20 0950 02/01/20 1551 02/03/20 0323  PROCALCITON  --  2.66  --   LATICACIDVEN 1.8  --  0.8    Recent Results (from the past 240 hour(s))  Culture, blood (routine x 2)     Status: Abnormal   Collection Time: 02/01/20  9:20 AM   Specimen: BLOOD LEFT ARM  Result Value Ref Range Status   Specimen Description BLOOD LEFT ARM  Final   Special Requests   Final    BOTTLES DRAWN AEROBIC AND ANAEROBIC Blood Culture adequate volume   Culture  Setup Time   Final    IN BOTH AEROBIC AND ANAEROBIC BOTTLES GRAM POSITIVE COCCI IN CLUSTERS CRITICAL RESULT CALLED TO, READ BACK BY AND VERIFIED WITH: Hessie Diener PHARMD 02/01/20 2244 JDW Performed at [facility], [address], [place], [place] [address]     Culture STAPHYLOCOCCUS AUREUS (A)  Final   Report Status 02/04/2020 FINAL  Final   Organism ID, Bacteria STAPHYLOCOCCUS AUREUS  Final      Susceptibility   Staphylococcus aureus - MIC*    CIPROFLOXACIN <=0.5 SENSITIVE Sensitive     ERYTHROMYCIN <=0.25 SENSITIVE Sensitive     GENTAMICIN <=0.5 SENSITIVE Sensitive     OXACILLIN <=0.25 SENSITIVE Sensitive     TETRACYCLINE <=1 SENSITIVE Sensitive     VANCOMYCIN 1 SENSITIVE Sensitive     TRIMETH/SULFA <=10 SENSITIVE Sensitive     CLINDAMYCIN <=0.25 SENSITIVE Sensitive     RIFAMPIN <=0.5 SENSITIVE Sensitive     Inducible Clindamycin NEGATIVE Sensitive     *  STAPHYLOCOCCUS AUREUS  Culture, blood (routine x 2)     Status: Abnormal   Collection Time: 02/01/20  9:30 AM   Specimen: BLOOD LEFT HAND  Result Value Ref Range Status   Specimen Description BLOOD LEFT HAND  Final   Special Requests   Final    BOTTLES DRAWN AEROBIC ONLY Blood Culture results may not be optimal due to an inadequate volume of blood received in culture bottles   Culture  Setup Time   Final    AEROBIC BOTTLE ONLY GRAM POSITIVE COCCI IN CLUSTERS Organism ID to follow CRITICAL RESULT CALLED TO, READ BACK BY AND VERIFIED WITH: V BRYK PHARMD 02/02/20 0213 JDW    Culture (A)  Final    STAPHYLOCOCCUS AUREUS SUSCEPTIBILITIES PERFORMED ON PREVIOUS CULTURE WITHIN THE LAST 5 DAYS. Performed at [facility], [address] [place], [place] [address]    Report Status 02/04/2020 FINAL  Final  Blood Culture ID Panel (Reflexed)     Status: Abnormal   Collection Time: 02/01/20  9:30 AM  Result Value Ref Range Status   Enterococcus species NOT DETECTED NOT DETECTED Final   Listeria monocytogenes NOT DETECTED NOT DETECTED Final   Staphylococcus species DETECTED (A) NOT DETECTED Final    Comment: CRITICAL RESULT CALLED TO, READ BACK BY AND VERIFIED WITH: V BRYK PHARMD 02/02/20 0213 JDW    Staphylococcus aureus (BCID) DETECTED (A) NOT DETECTED Final    Comment: Methicillin  (oxacillin) susceptible Staphylococcus aureus (MSSA). Preferred therapy is anti staphylococcal beta lactam antibiotic (Cefazolin or Nafcillin), unless clinically contraindicated. CRITICAL RESULT CALLED TO, READ BACK BY AND VERIFIED WITH: V BRYK PHARMD 02/02/20 0213 JDW    Methicillin resistance NOT DETECTED NOT DETECTED Final   Streptococcus species NOT DETECTED NOT DETECTED Final   Streptococcus agalactiae NOT DETECTED NOT DETECTED Final   Streptococcus pneumoniae NOT DETECTED NOT DETECTED Final   Streptococcus pyogenes NOT DETECTED NOT DETECTED Final   Acinetobacter baumannii NOT DETECTED NOT DETECTED Final   Enterobacteriaceae species NOT DETECTED NOT DETECTED Final   Enterobacter cloacae complex NOT DETECTED NOT DETECTED Final   Escherichia coli NOT DETECTED NOT DETECTED Final   Klebsiella oxytoca NOT DETECTED NOT DETECTED Final   Klebsiella pneumoniae NOT DETECTED NOT DETECTED Final   Proteus species NOT DETECTED NOT DETECTED Final   Serratia marcescens NOT DETECTED NOT DETECTED Final   Haemophilus influenzae NOT DETECTED NOT DETECTED Final   Neisseria meningitidis NOT DETECTED NOT DETECTED Final   Pseudomonas aeruginosa NOT DETECTED NOT DETECTED Final   Candida albicans NOT DETECTED NOT DETECTED Final   Candida glabrata NOT DETECTED NOT DETECTED Final   Candida krusei NOT DETECTED NOT DETECTED Final   Candida parapsilosis NOT DETECTED NOT DETECTED Final   Candida tropicalis NOT DETECTED NOT DETECTED Final    Comment: Performed at [facility], [address] [place], [place] [address]  SARS CORONAVIRUS 2 (TAT 6-24 HRS) Nasopharyngeal Nasopharyngeal Swab     Status: None   Collection Time: 02/01/20 10:26 AM   Specimen: Nasopharyngeal Swab  Result Value Ref Range Status   SARS Coronavirus 2 NEGATIVE NEGATIVE Final    Comment: (NOTE) SARS-CoV-2 target nucleic acids are NOT DETECTED. The SARS-CoV-2 RNA is generally detectable in upper and lower respiratory specimens during  the acute phase of infection. Negative results do not preclude SARS-CoV-2 infection, do not rule out co-infections with other pathogens, and should not be used as the sole basis for treatment or other patient management decisions. Negative results must be  combined with clinical observations, patient history, and epidemiological information. The expected result is Negative. Fact Sheet for Patients: HairSlick.no Fact Sheet for Healthcare Providers: quierodirigir.com This test is not yet approved or cleared by the [place] FDA and  has been authorized for detection and/or diagnosis of SARS-CoV-2 by FDA under an Emergency Use Authorization (EUA). This EUA will remain  in effect (meaning this test can be used) for the duration of the COVID-19 declaration under Section 56 4(b)(1) of the Act, 21 U.S.C. section 360bbb-3(b)(1), unless the authorization is terminated or revoked sooner. Performed at [facility], [address], [place], [place] [address]   Urine culture     Status: Abnormal   Collection Time: 02/01/20  3:51 PM   Specimen: In/Out Cath Urine  Result Value Ref Range Status   Specimen Description IN/OUT CATH URINE  Final   Special Requests   Final    NONE Performed at [facility], [address] [place], [place] [address]    Culture 50,000 COLONIES/mL STAPHYLOCOCCUS AUREUS (A)  Final   Report Status 02/03/2020 FINAL  Final   Organism ID, Bacteria STAPHYLOCOCCUS AUREUS (A)  Final      Susceptibility   Staphylococcus aureus - MIC*    CIPROFLOXACIN <=0.5 SENSITIVE Sensitive     GENTAMICIN <=0.5 SENSITIVE Sensitive     NITROFURANTOIN <=16 SENSITIVE Sensitive     OXACILLIN <=0.25 SENSITIVE Sensitive     TETRACYCLINE <=1 SENSITIVE Sensitive     VANCOMYCIN 1 SENSITIVE Sensitive     TRIMETH/SULFA <=10 SENSITIVE Sensitive     CLINDAMYCIN <=0.25 SENSITIVE Sensitive     RIFAMPIN <=0.5 SENSITIVE Sensitive      Inducible Clindamycin NEGATIVE Sensitive     * 50,000 COLONIES/mL STAPHYLOCOCCUS AUREUS  MRSA PCR Screening     Status: None   Collection Time: 02/02/20  9:37 AM   Specimen: Nasal Mucosa; Nasopharyngeal  Result Value Ref Range Status   MRSA by PCR NEGATIVE NEGATIVE Final    Comment:        The GeneXpert MRSA Assay (FDA approved for NASAL specimens only), is one component of a comprehensive MRSA colonization surveillance program. It is not intended to diagnose MRSA infection nor to guide or monitor treatment for MRSA infections. Performed at [facility], [address] [place], [place] [address]   Surgical pcr screen     Status: Abnormal   Collection Time: 02/02/20 10:41 PM   Specimen: Nasal Mucosa; Nasal Swab  Result Value Ref Range Status   MRSA, PCR NEGATIVE NEGATIVE Final   Staphylococcus aureus POSITIVE (A) NEGATIVE Final    Comment: (NOTE) The Xpert SA Assay (FDA approved for NASAL specimens in patients 38 years of age and older), is one component of a comprehensive surveillance program. It is not intended to diagnose infection nor to guide or monitor treatment. Performed at [facility], [address] [place], [place] [address]   Culture, blood (routine x 2)     Status: None (Preliminary result)   Collection Time: 02/03/20  2:24 PM   Specimen: BLOOD  Result Value Ref Range Status   Specimen Description BLOOD LEFT ARM  Final   Special Requests   Final    BOTTLES DRAWN AEROBIC AND ANAEROBIC Blood Culture adequate volume   Culture   Final    NO GROWTH 2 DAYS Performed at [facility], [address] [place], [place] [address]    Report Status PENDING  Incomplete  Culture,  blood (routine x 2)     Status: None (Preliminary result)   Collection Time: 02/03/20  2:26 PM   Specimen: BLOOD  Result Value Ref Range Status   Specimen Description BLOOD LEFT HAND  Final   Special Requests   Final    BOTTLES DRAWN AEROBIC AND ANAEROBIC Blood Culture  adequate volume   Culture   Final    NO GROWTH 2 DAYS Performed at [facility], [address], [place], [place] [address]    Report Status PENDING  Incomplete      Radiology Studies: DG Hand 2 View Right  Result Date: 02/04/2020 CLINICAL DATA:  Pain EXAM: RIGHT HAND - 2 VIEW COMPARISON:  None. FINDINGS: There are degenerative changes at the proximal interphalangeal joints of the second, fourth, and fifth digits. The changes appear to be both erosive and productive. There are degenerative changes at the radiocarpal joint with subchondral cystic changes involving the scaphoid and lunate. Degenerative changes are noted at the first [facility]. Nonspecific flexion deformities are noted at the proximal interphalangeal joints of the second, fourth, and fifth digits. There is no acute displaced fracture or dislocation. IMPRESSION: 1. No acute displaced fracture or dislocation. 2. 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 3. Degenerative changes at the radiocarpal joint. Electronically Signed   By: Katherine Mantle M.D.   On: 02/04/2020 20:23   DG Hand 2 View Left  Result Date: 02/04/2020 CLINICAL DATA:  Pain EXAM: LEFT HAND - 2 VIEW COMPARISON:  None. FINDINGS: There are mixed erosive and productive changes of the proximal interphalangeal joints of the third through fifth digits. There are degenerative changes at the fifth metacarpophalangeal joint. Degenerative changes are noted at the radiocarpal joint. There is no acute displaced fracture or dislocation. IMPRESSION: 1. No acute displaced fracture or dislocation. 2. 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 Electronically Signed   By: Katherine Mantle M.D.   On: 02/04/2020 20:24        Scheduled Meds: . Chlorhexidine Gluconate Cloth  6 each Topical Daily  . feeding supplement (GLUCERNA SHAKE)  237 mL Oral TID BM  . insulin aspart  0-15 Units Subcutaneous TID WC  . insulin  aspart  0-5 Units Subcutaneous QHS  . insulin detemir  8 Units Subcutaneous BID  . multivitamin with minerals  1 tablet Oral Daily  . mupirocin ointment  1 application Nasal BID  . senna-docusate  1 tablet Oral BID  . sodium chloride flush  3 mL Intravenous Q12H  . tamsulosin  0.4 mg Oral Daily  . vitamin B-12  1,000 mcg Oral Daily   Continuous Infusions: . sodium chloride 10 mL/hr at 02/03/20 1146  . sodium chloride 75 mL/hr at 02/04/20 1254  . sodium chloride 20 mL/hr at 02/04/20 1530  .  ceFAZolin (ANCEF) IV 2 g (02/05/20 0548)  . lactated ringers Stopped (02/03/20 1146)  . methocarbamol (ROBAXIN) IV         LOS: 4 days    Assessment/Plan: Sepsis /MSSA bacteremia due to bilateral foot osteomyelitis/MSSA UTI-He is started on Ancef Leukocytosis WBC 24.6 on presentation, down to 12 k now. Repeat blood culture  on 3/6, no growth so far. 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  ID and other specialty inputs appreciated. PICC line for prolonged abx course given discitis.   Bilateral foot osteomyelitis-s/p debridement of the ulcer on the left great toe S/p transmetatarsal Ray  amputation of right foot with wound VAC application on 3/6. S/P Irrigation and debridement of left toe.Orthopedic/ Dr. 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  He has mild neck pain which has been improving.  No numbness or weakness in his arms or hands, but does occasionally get a shooting pain in his right arm.  Neurosurgery input from Dr Maisie Fus appreciated--per NS review of MRI- there are early discitis changes with retropharyngeal or prevertebral inflammation as was as thin epidural phlegmon eccentric to the right without any significant cord impingement. NS recommends 6 wks-3 months antibiotics and repeat MR C-Spine with/without contrast in 6 weeks, or sooner if  progressive myelopathy develops. If develops worse back pain, will need MR L-spine W/WO contrast.  Scalene Myositis: Prevertebral edema with right-sided paravertebral fluid collections in the mid to lower cervical spine concerning for infection and abscesses with suspected myositis involving the right-sided scalene musculature noted on MRI. Case discussed with ENT Dr. Annalee Genta who recommend  antibiotic treatment, no surgical intervention needed  Urinary retention: Not sure if related to laying in bed, pain meds or spinal issue related neurogenic bladder. Placed on indwelling Foley as noted to have 700 ml on bladder scan again this afternoon.  Hyponatremia-He is started on LR on presentation , IVF changed to NS at 75cc/hr on 3/7. Sodium level improved from 120 ->121->124->126->125. Urine lytes/osmolarity ordered  Hypomagnesemia-Replaced mag x1 on 3/6 AND 2g 3/7, repeat level at 2.0   AKI- secondary to ATN in the setting of systemic infection. Renal ultrasound 3/5 showed no obstruction /hydronephrosis. Retaining urine now, monitor after foley placement.  Urine protein at 100. May have underlying diabetic nephropathy--not seen PCP in many years.  Renal dosing meds, continue hydration.  DM, uncontrolled-Apparently did not believe he had DM and so didn't go back to a physician since about 2008-DM coordinator consult-A1c is 11.3-A.m. fasting blood glucose 253, increase Levemir to 8 units twice daily, continue SSI, continue adjust insulin  Transaminitis: primarily ALP elevated which could be of bony source as T bili okay. Will check GGT. Hepatitis panel negative. CK unremarkable. Abdominal ultrasound showed cholelithiasis but no obstruction. .Improving with treating sepsis  Microcytic anemia-Hgb 9.8 ,MCV 68.8.UA showed 10-20 RBC/HPF. Reticulocyte count inappropriately low, B12 low normal, folate wnl, tsh wnl, iron panel pending. Stool occult blood pending collection/Monitor CBC  History of  delusional disorder, impaired memory-MRI brain showed mild cerebral atrophy and chronic small vessel ischemia. Currently calm, oriented x3, does has tangential thought process HIV negative,  Frequent falls-Fall precaution, PT eval.  On B12 supplement. Neurology consulted on presentation, neurology sent neuropathy labs, B1 /B6 levels pending, methylmalonic acid level pending, please follow-up result. States was living alone. Sister/Mother NOK. May need rehab as also on wound vac/needs long term abx x 6 weeks given spinal involvement.   Severe Protein calorie malnutrition: in the setting of severe systemic infection. Albumin level <2. Dietician consult.  .    DVT prophylaxis: Held Lovenox for procedure, SCD Code Status: Full code Family / Patient Communication: sister Disposition Plan:   Patient is from home prior to hospitalization. Received/Receiving inpatient care for  Systemic MSSA infection, bacteremia, osteomyelitis/discitis/urinary retention .  Discharge when medically optimised and cleared for d/c by consultants and PT- Will order PICC line for long term abx if okay per ID as blood cx from 3/6 so far negative. Consultants:  ID  Neurology  Cardiology for TEE  Neurosurgery  Orthopedics  Time spent: 35 minutes    Guilford Shi, MD [facility] Pager in [place]  If 7PM-7AM, please contact night-coverage [email or web address] 02/05/2020, 8:46 AM

## 2020-02-05 NOTE — Anesthesia Procedure Notes (Signed)
Date/Time: 02/05/2020 12:47 PM Performed by: Mayer Camel, CRNA Pre-anesthesia Checklist: Patient identified, Emergency Drugs available, Suction available and Patient being monitored Patient Re-evaluated:Patient Re-evaluated prior to induction Oxygen Delivery Method: Circle System Utilized and Nasal cannula Placement Confirmation: positive ETCO2

## 2020-02-05 NOTE — Anesthesia Preprocedure Evaluation (Addendum)
Anesthesia Evaluation  Patient identified by MRN, date of birth, ID band Patient awake    Reviewed: Allergy & Precautions, NPO status , Patient's Chart, lab work & pertinent test results  Airway Mallampati: II  TM Distance: >3 FB Neck ROM: Full    Dental no notable dental hx. (+) Poor Dentition, Dental Advisory Given,    Pulmonary neg pulmonary ROS,    Pulmonary exam normal breath sounds clear to auscultation       Cardiovascular hypertension, Normal cardiovascular exam Rhythm:Regular Rate:Normal     Neuro/Psych PSYCHIATRIC DISORDERS Delusional disordernegative neurological ROS     GI/Hepatic negative GI ROS, Elevated LFTs   Endo/Other  diabetes, Poorly Controlled, Type 2, Insulin DependentHbA1c 11.3, diabetic foot ulcer/osteo  Renal/GU CRFRenal diseaseCr 1.5  negative genitourinary   Musculoskeletal negative musculoskeletal ROS (+)   Abdominal (+) + obese,   Peds negative pediatric ROS (+)  Hematology  (+) Blood dyscrasia, anemia , hct 31.3   Anesthesia Other Findings Bacteremia, TEE for possible intracardiac source  Reproductive/Obstetrics negative OB ROS                           Anesthesia Physical Anesthesia Plan  ASA: III  Anesthesia Plan: MAC   Post-op Pain Management:    Induction: Intravenous  PONV Risk Score and Plan: Propofol infusion and TIVA  Airway Management Planned: Natural Airway and Nasal Cannula  Additional Equipment: None  Intra-op Plan:   Post-operative Plan:   Informed Consent: I have reviewed the patients History and Physical, chart, labs and discussed the procedure including the risks, benefits and alternatives for the proposed anesthesia with the patient or authorized representative who has indicated his/her understanding and acceptance.     Dental advisory given  Plan Discussed with: CRNA  Anesthesia Plan Comments:         Anesthesia  Quick Evaluation

## 2020-02-05 NOTE — CV Procedure (Addendum)
INDICATIONS: Query infective endocarditis  PROCEDURE:   Informed consent was obtained prior to the procedure. The risks, benefits and alternatives for the procedure were discussed and the patient comprehended these risks.  Risks include, but are not limited to, cough, sore throat, vomiting, nausea, somnolence, esophageal and stomach trauma or perforation, bleeding, low blood pressure, aspiration, pneumonia, infection, trauma to the teeth and death.    After a procedural time-out, the oropharynx was anesthetized with 20% benzocaine spray.   During this procedure the patient was administered propofol per anesthesia for monitored deep sedation.  The patient's heart rate, blood pressure, and oxygen saturationweare monitored continuously during the procedure. The period of conscious sedation was 30 minutes, of which I was present face-to-face 100% of this time.  The transesophageal probe was inserted in the esophagus and stomach without difficulty and multiple views were obtained.  The patient was kept under observation until the patient left the procedure room.  The patient left the procedure room in stable condition.   Agitated microbubble saline contrast was administered.  COMPLICATIONS:    There were no immediate complications.  FINDINGS:  No valvular vegetations LVEF 65% Mild MR Mild TR Tiny PFO with right to left shunt with agitated saline.   RECOMMENDATIONS:     Ok to return to hospital room when alert  Time Spent Directly with the Patient:   45 minutes   Taitum Alms A Jacques Navy 02/05/2020, 1:25 PM

## 2020-02-05 NOTE — Progress Notes (Signed)
Notified MD Kamineni via secure chat that bladder scan was 752 mL. Gave order to insert foley cath.   Leonia Reeves, RN, BSN.

## 2020-02-05 NOTE — Progress Notes (Signed)
PT Cancellation Note  Patient Details Name: Erice Ahles MRN: [id number] DOB: [birth year]   Cancelled Treatment:    Reason Eval/Treat Not Completed: Fatigue/lethargy limiting ability to participate;Other (comment).  Gone to TEE this AM then was not feeling like he could do anything, waiting for a meal and feeling off from anesthesia.  Try at another time as pt can allow.   HILERY WINTLE 02/05/2020, 3:49 PM   Samul Dada, PT MS Acute Rehab Dept. Number: [facility] [id number] and [facility] [phone]

## 2020-02-05 NOTE — Consult Note (Signed)
  Chief Complaint   Chief Complaint  Patient presents with  . Weakness  . Fall    History of Present Illness  Chris Bowman is a 59 y.o. man with uncontrolled diabetes who presented with sepsis with bacteremia.  He was found to have a soft tissue neck infection including early discitis changes.  He has mild neck pain which has been improving.  No numbness or weakness in his arms or hands, but does occasionally get a shooting pain in his right arm.  On questioning, he also had some back pain initially but this has resolved over the past several days. Past Medical History   Past Medical History:  Diagnosis Date  . Delusional disorder (HCC)   . Essential hypertension   . Uncontrolled type 2 diabetes mellitus [facility])     Past Surgical History   Past Surgical History:  Procedure Laterality Date  . AMPUTATION Right 02/03/2020   Procedure: AMPUTATION RAY, transmetatarsal of right foot;  Surgeon: Nadara Mustard, MD;  Location: [facility] OR;  Service: Orthopedics;  Laterality: Right;  . I & D EXTREMITY Left 02/03/2020   Procedure: IRRIGATION AND DEBRIDEMENT EXTREMITY, left great toe;  Surgeon: Nadara Mustard, MD;  Location: [facility] OR;  Service: Orthopedics;  Laterality: Left;  . TONSILLECTOMY      Social History   Social History   Tobacco Use  . Smoking status: Never Smoker  . Smokeless tobacco: Never Used  Substance Use Topics  . Alcohol use: Never  . Drug use: Never    Medications   Prior to Admission medications   Medication Sig Start Date End Date Taking? Authorizing Provider  acetaminophen (TYLENOL) 325 MG tablet Take 650 mg by mouth every 6 (six) hours as needed for mild pain.   Yes [provider]    Allergies   Allergies  Allergen Reactions  . Codeine     Kept sedated     Review of Systems  ROS  Neurologic Exam  Awake, alert, oriented Memory and concentration grossly intact Speech fluent, appropriate CN grossly intact Motor exam: Upper Extremities Deltoid  Bicep Tricep Grip  Right 5/5 5/5 5/5 5/5  Left 5/5 5/5 5/5 5/5   Lower Extremities IP Quad PF DF EHL  Right 5/5 5/5 5/5 5/5 5/5  Left 5/5 5/5 5/5 5/5 5/5   Sensation grossly intact to LT  Neurologic strength full, though he is generally deconditioned  Imaging  MRI C-spine reviewed.  There are early discitis changes with retropharyngeal/prevertebral inflammation as was as thin epidural phlegmon eccentric to the right without any significant cord impingement.  Impression  - 59 y.o. man with cervical osteomyelitis/discitis  Plan  - will need 6 wks-3 months antibiotics as managed by ID - repeat MR C-Spine with/without contrast in 6 weeks, or sooner if progressive myelopathy develops.  - if develops worse back pain, recommend MR L-spine with/without contrast

## 2020-02-05 NOTE — Progress Notes (Signed)
[facility] for Infectious Disease    Date of Admission:  02/01/2020   Total days of antibiotics 5           ID: Chris Bowman is a 59 y.o. 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 Underwent TEE which was negative for vegetation  Medications:  . Chlorhexidine Gluconate Cloth  6 each Topical Daily  . feeding supplement (GLUCERNA SHAKE)  237 mL Oral TID BM  . insulin aspart  0-15 Units Subcutaneous TID WC  . insulin aspart  0-5 Units Subcutaneous QHS  . insulin detemir  8 Units Subcutaneous BID  . insulin starter kit- pen needles  1 kit Other Once  . multivitamin with minerals  1 tablet Oral Daily  . mupirocin ointment  1 application Nasal BID  . senna-docusate  1 tablet Oral BID  . sodium chloride flush  3 mL Intravenous Q12H  . tamsulosin  0.4 mg Oral Daily  . vitamin B-12  1,000 mcg Oral Daily    Objective: Vital signs in last 24 hours: Temp:  [97.3 F (36.3 C)-99.8 F (37.7 C)] 99.5 F (37.5 C) (03/08 1451) Pulse Rate:  [84-92] 87 (03/08 1451) Resp:  [14-23] 18 (03/08 1451) BP: (88-154)/(47-110) 154/70 (03/08 1451) SpO2:  [91 %-98 %] 95 % (03/08 1451) Weight:  [92.3 kg] 92.3 kg (03/07 2107) Physical Exam  Constitutional: He is oriented to person, place, and time. He appears well-developed and well-nourished. No distress.  HENT:  Mouth/Throat: Oropharynx is clear and moist. No oropharyngeal exudate.  Cardiovascular: Normal rate, regular rhythm and normal heart sounds. Exam reveals no gallop and no friction rub.  No murmur heard.  Pulmonary/Chest: Effort normal and breath sounds normal. No  respiratory distress. He has no wheezes.  Abdominal: Soft. Bowel sounds are normal. He exhibits no distension. There is no tenderness.  VHQ:IONGE foot wound vac at TM amputation site. Left great toe wrapped Neurological: He is alert and oriented to person, place, and time.  Skin: Skin is warm and dry. No rash noted. No erythema.  Psychiatric: He has a normal mood and affect. His behavior is normal.     Lab Results Recent Labs    02/04/20 0410 02/05/20 0500  WBC 14.7* 12.8*  HGB 9.3* 9.9*  HCT 29.1* 31.3*  NA 126* 125*  K 4.5 4.7  CL 92* 92*  CO2 24 24  BUN 24* 24*  CREATININE 1.53* 1.54*   Liver Panel Recent Labs    02/04/20 0410 02/05/20 0500  PROT 6.4* 6.8  ALBUMIN 1.8* 1.7*  AST 49* 92*  ALT 27 41  ALKPHOS 239* 248*  BILITOT 0.6 0.2*   Sedimentation Rate Recent Labs    02/03/20 0323  ESRSEDRATE 94*   C-Reactive Protein Recent Labs    02/03/20 0323  CRP 13.2*    Microbiology: reviewed Studies/Results: DG Hand 2 View Right  Result Date: 02/04/2020 CLINICAL DATA:  Pain EXAM: RIGHT HAND - 2 VIEW COMPARISON:  None. FINDINGS: There are degenerative changes at the proximal interphalangeal joints of the second, fourth, and fifth digits. The changes appear to be both erosive  and productive. There are degenerative changes at the radiocarpal joint with subchondral cystic changes involving the scaphoid and lunate. Degenerative changes are noted at the first [facility]. Nonspecific flexion deformities are noted at the proximal interphalangeal joints of the second, fourth, and fifth digits. There is no acute displaced fracture or dislocation. IMPRESSION: 1. No acute displaced fracture or dislocation. 2. 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 3. Degenerative changes at the radiocarpal joint. Electronically Signed   By: Constance Holster M.D.   On: 02/04/2020 20:23   DG Hand 2 View Left  Result Date:  02/04/2020 CLINICAL DATA:  Pain EXAM: LEFT HAND - 2 VIEW COMPARISON:  None. FINDINGS: There are mixed erosive and productive changes of the proximal interphalangeal joints of the third through fifth digits. There are degenerative changes at the fifth metacarpophalangeal joint. Degenerative changes are noted at the radiocarpal joint. There is no acute displaced fracture or dislocation. IMPRESSION: 1. No acute displaced fracture or dislocation. 2. 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 Electronically Signed   By: Constance Holster M.D.   On: 02/04/2020 20:24     Assessment/Plan: MSSA DFU/osteomyelitis and bacteremia = he has been ruled out for endocarditis. Plan to treat for 6 wk with iv cefazolin for diabetic foot osteomyelitis s/p debridement. Recommend that he gets picc line and will have him follow up in the ID clinic in 4 wk  Will place opat orders for him  Diagnosis: osteomyelitis  Culture Result: MSSA  Allergies  Allergen Reactions  . Codeine     Kept sedated     OPAT Orders Discharge antibiotics: Per pharmacy protocol cefazolin 2gm iv q 8hr  Duration: 6 wk End Date:   [facility] Per Protocol:  Home health RN for IV administration and teaching; PICC line care and labs.    Labs weekly while on IV antibiotics: x__ CBC with differential _x_ BMP   _x_ Please pull PIC at completion of IV antibiotics   Fax weekly labs to [phone]  Clinic Follow Up Appt: 4 wk with Chris Bowman  @[email or web address]   [facility] for Infectious Diseases Cell: [phone] Pager: [phone]  02/05/2020, 6:30 PM

## 2020-02-05 NOTE — Interval H&P Note (Signed)
History and Physical Interval Note:  02/05/2020 12:39 PM  Chris Bowman  has presented today for surgery, with the diagnosis of bacteremia.  The various methods of treatment have been discussed with the patient and family. After consideration of risks, benefits and other options for treatment, the patient has consented to  Procedure(s): TRANSESOPHAGEAL ECHOCARDIOGRAM (TEE) (N/A) as a surgical intervention.  The patient's history has been reviewed, patient examined, no change in status, stable for surgery.  I have reviewed the patient's chart and labs.  Questions were answered to the patient's satisfaction.     Parke Poisson

## 2020-02-06 ENCOUNTER — Encounter: Payer: Self-pay | Admitting: *Deleted

## 2020-02-06 ENCOUNTER — Inpatient Hospital Stay: Payer: Self-pay

## 2020-02-06 DIAGNOSIS — R509 Fever, unspecified: Secondary | ICD-10-CM | POA: Diagnosis present

## 2020-02-06 DIAGNOSIS — N179 Acute kidney failure, unspecified: Secondary | ICD-10-CM

## 2020-02-06 DIAGNOSIS — R7881 Bacteremia: Secondary | ICD-10-CM | POA: Diagnosis present

## 2020-02-06 LAB — COMPREHENSIVE METABOLIC PANEL
ALT: 62 U/L — ABNORMAL HIGH (ref 0–44)
AST: 145 U/L — ABNORMAL HIGH (ref 15–41)
Albumin: 1.6 g/dL — ABNORMAL LOW (ref 3.5–5.0)
Alkaline Phosphatase: 229 U/L — ABNORMAL HIGH (ref 38–126)
Anion gap: 11 (ref 5–15)
BUN: 24 mg/dL — ABNORMAL HIGH (ref 6–20)
CO2: 23 mmol/L (ref 22–32)
Calcium: 8.1 mg/dL — ABNORMAL LOW (ref 8.9–10.3)
Chloride: 93 mmol/L — ABNORMAL LOW (ref 98–111)
Creatinine, Ser: 1.37 mg/dL — ABNORMAL HIGH (ref 0.61–1.24)
GFR calc Af Amer: >60 mL/min (ref >60)
GFR calc non Af Amer: 57 mL/min — ABNORMAL LOW (ref >60)
Glucose, Bld: 154 mg/dL — ABNORMAL HIGH (ref 70–99)
Potassium: 5 mmol/L (ref 3.5–5.1)
Sodium: 127 mmol/L — ABNORMAL LOW (ref 135–145)
Total Bilirubin: 0.2 mg/dL — ABNORMAL LOW (ref 0.3–1.2)
Total Protein: 6.3 g/dL — ABNORMAL LOW (ref 6.5–8.1)

## 2020-02-06 LAB — CBC WITH DIFFERENTIAL/PLATELET
Abs Immature Granulocytes: 0.1 10*3/uL — ABNORMAL HIGH (ref 0.00–0.07)
Basophils Absolute: 0.1 10*3/uL (ref 0.0–0.1)
Basophils Relative: 1 %
Eosinophils Absolute: 0.1 10*3/uL (ref 0.0–0.5)
Eosinophils Relative: 1 %
HCT: 27.8 % — ABNORMAL LOW (ref 39.0–52.0)
Hemoglobin: 9.1 g/dL — ABNORMAL LOW (ref 13.0–17.0)
Immature Granulocytes: 1 %
Lymphocytes Relative: 13 %
Lymphs Abs: 1.6 10*3/uL (ref 0.7–4.0)
MCH: 22.7 pg — ABNORMAL LOW (ref 26.0–34.0)
MCHC: 32.7 g/dL (ref 30.0–36.0)
MCV: 69.3 fL — ABNORMAL LOW (ref 80.0–100.0)
Monocytes Absolute: 1.1 10*3/uL — ABNORMAL HIGH (ref 0.1–1.0)
Monocytes Relative: 9 %
Neutro Abs: 9.6 10*3/uL — ABNORMAL HIGH (ref 1.7–7.7)
Neutrophils Relative %: 75 %
Platelets: 411 10*3/uL — ABNORMAL HIGH (ref 150–400)
RBC: 4.01 MIL/uL — ABNORMAL LOW (ref 4.22–5.81)
RDW: 15 % (ref 11.5–15.5)
WBC: 12.6 10*3/uL — ABNORMAL HIGH (ref 4.0–10.5)
nRBC: 0 % (ref 0.0–0.2)

## 2020-02-06 LAB — METHYLMALONIC ACID, SERUM: Methylmalonic Acid, Quantitative: 403 nmol/L — ABNORMAL HIGH (ref 0–378)

## 2020-02-06 LAB — GLUCOSE, CAPILLARY
Glucose-Capillary: 156 mg/dL — ABNORMAL HIGH (ref 70–99)
Glucose-Capillary: 179 mg/dL — ABNORMAL HIGH (ref 70–99)
Glucose-Capillary: 177 mg/dL — ABNORMAL HIGH (ref 70–99)
Glucose-Capillary: 136 mg/dL — ABNORMAL HIGH (ref 70–99)

## 2020-02-06 MED ORDER — INSULIN ASPART PROT & ASPART (70-30 MIX) 100 UNIT/ML ~~LOC~~ SUSP
8.0000 [IU] | Freq: Two times a day (BID) | SUBCUTANEOUS | Status: DC
  Administered 2020-02-06 – 2020-03-01 (×48): 8 [IU] via SUBCUTANEOUS
  Filled 2020-02-06: qty 10

## 2020-02-06 NOTE — Progress Notes (Addendum)
PROGRESS NOTE    Chris Bowman  WGN:[id number]  DOB: [birth year]  PCP: Patient, No Pcp Per Admit date:02/01/2020  59 y.o.malewithunknownmedical history who has not seen a PCP since 2008, presented to Ed  with weakness and falls x 2 weeks associated with fevers, dyspnea and cough. He has had "this great limitation on my range of movement", chronic buttocks pain since the falls (improving). He also noticed R arm pain.He had been diagnosed with DM in 2008 but didn't think he needed medicine for it .Reports h/o taking too many baby ASA and having his stomach pumped and "I have a little incision scar from that." ED Course: Afebrile (has rectal fever), ?source. COVID negative. WBC 24, UA pending. Glucose 300. A1c is 11. Na-120.  Hospital course: Patient admitted to [facility] for further evaluation and work up. Patient has had fever spikes while hospitalized. W/U revealed MSSA UTI, bacteremia, bilateral foot osteomyelitis with associated sepsis syndrome. There is also concern for Cervical spine epidural abscess/phlegmon c3-c5, No cord compression ,no evidence of discitis or osteomyelitis Formal Neurosurgery consult requested, case discussed with neurosurgery Dr Marcello Moores. He is on Ancef. TEE to rule out endocarditis, cardiology made aware. Repeat blood culture  on 3/6, no growth. Underwent  transmetatarsal ray amputation of right foot as well as irrigation/debridement of left great toe with wound VAC application on 3/6.ID Manson Passey Dr Duda/cardiology input appreciated.He is started on LR on presentation ,ivf changed to NS at 75cc/hr on 3/7,  slowly improving. Sodium now improved to ~125,    Subjective:  Patient out of bed to chair today and eating lunch.  Now has indwelling Foley catheter.  Objective: Vitals:   02/06/20 0416 02/06/20 0816 02/06/20 0939 02/06/20 1350  BP: (!) 145/75 117/69 128/89 132/70  Pulse: 93 92 97 96  Resp: 19 18 18 18   Temp: 99.5 F (37.5 C) 98.8 F (37.1 C) 98.8 F (37.1 C)  99.5 F (37.5 C)  TempSrc: Oral Oral Oral Oral  SpO2: 96% 94% 100%   Weight:      Height:        Intake/Output Summary (Last 24 hours) at 02/06/2020 1458 Last data filed at 02/06/2020 0853 Gross per 24 hour  Intake 1218.37 ml  Output 3275 ml  Net -2056.63 ml   Filed Weights   02/01/20 0801 02/01/20 2300 02/04/20 2107  Weight: 68 kg 83 kg 92.3 kg    Physical Examination:  General exam: Appears somewhat irritable but no acute distress  Respiratory system: Clear to auscultation. Respiratory effort normal. Cardiovascular system: S1 & S2 heard, RRR. No JVD, murmurs. No pedal edema. Gastrointestinal system: Abdomen is nondistended, soft and nontender. Normal bowel sounds heard.  Foley catheter in place draining clear urine Central nervous system: Alert and oriented. No new focal neurological deficits. Extremities: s/p transmetatarsal amputation on rt foot with dressing/wound vac in place. Skin: No rashes, lesions or ulcers Psychiatry: Judgement and insight appear normal. Mood & affect mildly irritable.   Data Reviewed: I have personally reviewed following labs and imaging studies  CBC: Recent Labs  Lab 02/01/20 0805 02/01/20 0930 02/02/20 0734 02/03/20 0323 02/04/20 0410 02/05/20 0500 02/06/20 0421  WBC   < >  --  18.4* 16.0* 14.7* 12.8* 12.6*  NEUTROABS  --  19.3*  --  12.8* 11.2* 9.8* 9.6*  HGB   < >  --  9.8* 9.2* 9.3* 9.9* 9.1*  HCT   < >  --  30.2* 27.5* 29.1* 31.3* 27.8*  MCV   < >  --  68.8* 67.4* 71.0* 71.6* 69.3*  PLT   < >  --  303 314 375 404* 411*   < > = values in this interval not displayed.   Basic Metabolic Panel: Recent Labs  Lab 02/02/20 0734 02/03/20 0323 02/04/20 0410 02/05/20 0500 02/06/20 0421  NA 121* 124* 126* 125* 127*  K 4.0 4.1 4.5 4.7 5.0  CL 90* 93* 92* 92* 93*  CO2 19* 21* 24 24 23   GLUCOSE 271* 225* 253* 264* 154*  BUN 34* 31* 24* 24* 24*  CREATININE 1.68* 1.57* 1.53* 1.54* 1.37*  CALCIUM 8.0* 7.8* 8.1* 8.2* 8.1*  MG  --  1.5*  2.0 2.0  --    GFR: Estimated Creatinine Clearance: 65.6 mL/min (A) (by C-G formula based on SCr of 1.37 mg/dL (H)). Liver Function Tests: Recent Labs  Lab 02/01/20 0805 02/03/20 0323 02/04/20 0410 02/05/20 0500 02/06/20 0421  AST 75* 43* 49* 92* 145*  ALT 109* 41 27 41 62*  ALKPHOS 317* 258* 239* 248* 229*  BILITOT 1.2 0.5 0.6 0.2* 0.2*  PROT 8.2* 6.1* 6.4* 6.8 6.3*  ALBUMIN 2.3* 1.6* 1.8* 1.7* 1.6*   Recent Labs  Lab 02/03/20 0323  LIPASE 30   Recent Labs  Lab 02/02/20 1131  AMMONIA 18   Coagulation Profile: Recent Labs  Lab 02/01/20 0930  INR 1.1   Cardiac Enzymes: Recent Labs  Lab 02/01/20 1551 02/03/20 0323  CKTOTAL 179 62   BNP (last 3 results) No results for input(s): PROBNP in the last 8760 hours. HbA1C: No results for input(s): HGBA1C in the last 72 hours. CBG: Recent Labs  Lab 02/05/20 1443 02/05/20 1626 02/05/20 2122 02/06/20 0705 02/06/20 1110  GLUCAP 145* 172* 164* 156* 179*   Lipid Profile: No results for input(s): CHOL, HDL, LDLCALC, TRIG, CHOLHDL, LDLDIRECT in the last 72 hours. Thyroid Function Tests: No results for input(s): TSH, T4TOTAL, FREET4, T3FREE, THYROIDAB in the last 72 hours. Anemia Panel: Recent Labs    02/05/20 0500  FERRITIN 543*  TIBC 174*  IRON 12*   Sepsis Labs: Recent Labs  Lab 02/01/20 0950 02/01/20 1551 02/03/20 0323  PROCALCITON  --  2.66  --   LATICACIDVEN 1.8  --  0.8    Recent Results (from the past 240 hour(s))  Culture, blood (routine x 2)     Status: Abnormal   Collection Time: 02/01/20  9:20 AM   Specimen: BLOOD LEFT ARM  Result Value Ref Range Status   Specimen Description BLOOD LEFT ARM  Final   Special Requests   Final    BOTTLES DRAWN AEROBIC AND ANAEROBIC Blood Culture adequate volume   Culture  Setup Time   Final    IN BOTH AEROBIC AND ANAEROBIC BOTTLES GRAM POSITIVE COCCI IN CLUSTERS CRITICAL RESULT CALLED TO, READ BACK BY AND VERIFIED WITH: Diona Browner PHARMD 02/01/20 2244  JDW Performed at [facility], [place] [address], [place], [place] [address]    Culture STAPHYLOCOCCUS AUREUS (A)  Final   Report Status 02/04/2020 FINAL  Final   Organism ID, Bacteria STAPHYLOCOCCUS AUREUS  Final      Susceptibility   Staphylococcus aureus - MIC*    CIPROFLOXACIN <=0.5 SENSITIVE Sensitive     ERYTHROMYCIN <=0.25 SENSITIVE Sensitive     GENTAMICIN <=0.5 SENSITIVE Sensitive     OXACILLIN <=0.25 SENSITIVE Sensitive     TETRACYCLINE <=1 SENSITIVE Sensitive     VANCOMYCIN 1 SENSITIVE Sensitive     TRIMETH/SULFA <=10 SENSITIVE Sensitive     CLINDAMYCIN <=  0.25 SENSITIVE Sensitive     RIFAMPIN <=0.5 SENSITIVE Sensitive     Inducible Clindamycin NEGATIVE Sensitive     * STAPHYLOCOCCUS AUREUS  Culture, blood (routine x 2)     Status: Abnormal   Collection Time: 02/01/20  9:30 AM   Specimen: BLOOD LEFT HAND  Result Value Ref Range Status   Specimen Description BLOOD LEFT HAND  Final   Special Requests   Final    BOTTLES DRAWN AEROBIC ONLY Blood Culture results may not be optimal due to an inadequate volume of blood received in culture bottles   Culture  Setup Time   Final    AEROBIC BOTTLE ONLY GRAM POSITIVE COCCI IN CLUSTERS Organism ID to follow CRITICAL RESULT CALLED TO, READ BACK BY AND VERIFIED WITH: V BRYK PHARMD 02/02/20 0213 JDW    Culture (A)  Final    STAPHYLOCOCCUS AUREUS SUSCEPTIBILITIES PERFORMED ON PREVIOUS CULTURE WITHIN THE LAST 5 DAYS. Performed at [facility], [place] [address] [place], [place] [address]    Report Status 02/04/2020 FINAL  Final  Blood Culture ID Panel (Reflexed)     Status: Abnormal   Collection Time: 02/01/20  9:30 AM  Result Value Ref Range Status   Enterococcus species NOT DETECTED NOT DETECTED Final   Listeria monocytogenes NOT DETECTED NOT DETECTED Final   Staphylococcus species DETECTED (A) NOT DETECTED Final    Comment: CRITICAL RESULT CALLED TO, READ BACK BY AND VERIFIED WITH: V BRYK PHARMD 02/02/20 0213 JDW     Staphylococcus aureus (BCID) DETECTED (A) NOT DETECTED Final    Comment: Methicillin (oxacillin) susceptible Staphylococcus aureus (MSSA). Preferred therapy is anti staphylococcal beta lactam antibiotic (Cefazolin or Nafcillin), unless clinically contraindicated. CRITICAL RESULT CALLED TO, READ BACK BY AND VERIFIED WITH: V BRYK PHARMD 02/02/20 0213 JDW    Methicillin resistance NOT DETECTED NOT DETECTED Final   Streptococcus species NOT DETECTED NOT DETECTED Final   Streptococcus agalactiae NOT DETECTED NOT DETECTED Final   Streptococcus pneumoniae NOT DETECTED NOT DETECTED Final   Streptococcus pyogenes NOT DETECTED NOT DETECTED Final   Acinetobacter baumannii NOT DETECTED NOT DETECTED Final   Enterobacteriaceae species NOT DETECTED NOT DETECTED Final   Enterobacter cloacae complex NOT DETECTED NOT DETECTED Final   Escherichia coli NOT DETECTED NOT DETECTED Final   Klebsiella oxytoca NOT DETECTED NOT DETECTED Final   Klebsiella pneumoniae NOT DETECTED NOT DETECTED Final   Proteus species NOT DETECTED NOT DETECTED Final   Serratia marcescens NOT DETECTED NOT DETECTED Final   Haemophilus influenzae NOT DETECTED NOT DETECTED Final   Neisseria meningitidis NOT DETECTED NOT DETECTED Final   Pseudomonas aeruginosa NOT DETECTED NOT DETECTED Final   Candida albicans NOT DETECTED NOT DETECTED Final   Candida glabrata NOT DETECTED NOT DETECTED Final   Candida krusei NOT DETECTED NOT DETECTED Final   Candida parapsilosis NOT DETECTED NOT DETECTED Final   Candida tropicalis NOT DETECTED NOT DETECTED Final    Comment: Performed at [facility], [place] [address], [place], [place] [address]  SARS CORONAVIRUS 2 (TAT 6-24 HRS) Nasopharyngeal Nasopharyngeal Swab     Status: None   Collection Time: 02/01/20 10:26 AM   Specimen: Nasopharyngeal Swab  Result Value Ref Range Status   SARS Coronavirus 2 NEGATIVE NEGATIVE Final    Comment: (NOTE) SARS-CoV-2 target nucleic acids are NOT DETECTED. The  SARS-CoV-2 RNA is generally detectable in upper and lower respiratory specimens during the acute phase of infection. Negative results do not preclude SARS-CoV-2 infection, do not rule out  co-infections with other pathogens, and should not be used as the sole basis for treatment or other patient management decisions. Negative results must be combined with clinical observations, patient history, and epidemiological information. The expected result is Negative. Fact Sheet for Patients: SugarRoll.be Fact Sheet for Healthcare Providers: https://www.woods-mathews.com/ This test is not yet approved or cleared by the [place] FDA and  has been authorized for detection and/or diagnosis of SARS-CoV-2 by FDA under an Emergency Use Authorization (EUA). This EUA will remain  in effect (meaning this test can be used) for the duration of the COVID-19 declaration under Section 56 4(b)(1) of the Act, 21 U.S.C. section 360bbb-3(b)(1), unless the authorization is terminated or revoked sooner. Performed at [facility], [place] [address], [place], [place] [address]   Urine culture     Status: Abnormal   Collection Time: 02/01/20  3:51 PM   Specimen: In/Out Cath Urine  Result Value Ref Range Status   Specimen Description IN/OUT CATH URINE  Final   Special Requests   Final    NONE Performed at [facility], [place] [address] [place], [place] [address]    Culture 50,000 COLONIES/mL STAPHYLOCOCCUS AUREUS (A)  Final   Report Status 02/03/2020 FINAL  Final   Organism ID, Bacteria STAPHYLOCOCCUS AUREUS (A)  Final      Susceptibility   Staphylococcus aureus - MIC*    CIPROFLOXACIN <=0.5 SENSITIVE Sensitive     GENTAMICIN <=0.5 SENSITIVE Sensitive     NITROFURANTOIN <=16 SENSITIVE Sensitive     OXACILLIN <=0.25 SENSITIVE Sensitive     TETRACYCLINE <=1 SENSITIVE Sensitive     VANCOMYCIN 1 SENSITIVE Sensitive     TRIMETH/SULFA <=10 SENSITIVE Sensitive      CLINDAMYCIN <=0.25 SENSITIVE Sensitive     RIFAMPIN <=0.5 SENSITIVE Sensitive     Inducible Clindamycin NEGATIVE Sensitive     * 50,000 COLONIES/mL STAPHYLOCOCCUS AUREUS  MRSA PCR Screening     Status: None   Collection Time: 02/02/20  9:37 AM   Specimen: Nasal Mucosa; Nasopharyngeal  Result Value Ref Range Status   MRSA by PCR NEGATIVE NEGATIVE Final    Comment:        The GeneXpert MRSA Assay (FDA approved for NASAL specimens only), is one component of a comprehensive MRSA colonization surveillance program. It is not intended to diagnose MRSA infection nor to guide or monitor treatment for MRSA infections. Performed at [facility], [place] [address] [place], [place] [address]   Surgical pcr screen     Status: Abnormal   Collection Time: 02/02/20 10:41 PM   Specimen: Nasal Mucosa; Nasal Swab  Result Value Ref Range Status   MRSA, PCR NEGATIVE NEGATIVE Final   Staphylococcus aureus POSITIVE (A) NEGATIVE Final    Comment: (NOTE) The Xpert SA Assay (FDA approved for NASAL specimens in patients 49 years of age and older), is one component of a comprehensive surveillance program. It is not intended to diagnose infection nor to guide or monitor treatment. Performed at [facility], [place] [address] [place], [place] [address]   Culture, blood (routine x 2)     Status: None (Preliminary result)   Collection Time: 02/03/20  2:24 PM   Specimen: BLOOD  Result Value Ref Range Status   Specimen Description BLOOD LEFT ARM  Final   Special Requests   Final    BOTTLES DRAWN AEROBIC AND ANAEROBIC Blood Culture adequate volume   Culture   Final    NO GROWTH 3  DAYS Performed at [facility], [place] [address] [place], [place] [address]    Report Status PENDING  Incomplete  Culture, blood (routine x 2)     Status: None (Preliminary result)   Collection Time: 02/03/20  2:26 PM   Specimen: BLOOD  Result Value Ref Range Status   Specimen Description BLOOD LEFT HAND   Final   Special Requests   Final    BOTTLES DRAWN AEROBIC AND ANAEROBIC Blood Culture adequate volume   Culture   Final    NO GROWTH 3 DAYS Performed at [facility], [place] [address], [place], [place] [address]    Report Status PENDING  Incomplete      Radiology Studies: DG Hand 2 View Right  Result Date: 02/04/2020 CLINICAL DATA:  Pain EXAM: RIGHT HAND - 2 VIEW COMPARISON:  None. FINDINGS: There are degenerative changes at the proximal interphalangeal joints of the second, fourth, and fifth digits. The changes appear to be both erosive and productive. There are degenerative changes at the radiocarpal joint with subchondral cystic changes involving the scaphoid and lunate. Degenerative changes are noted at the first [facility]. Nonspecific flexion deformities are noted at the proximal interphalangeal joints of the second, fourth, and fifth digits. There is no acute displaced fracture or dislocation. IMPRESSION: 1. No acute displaced fracture or dislocation. 2. 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 3. Degenerative changes at the radiocarpal joint. Electronically Signed   By: Constance Holster M.D.   On: 02/04/2020 20:23   DG Hand 2 View Left  Result Date: 02/04/2020 CLINICAL DATA:  Pain EXAM: LEFT HAND - 2 VIEW COMPARISON:  None. FINDINGS: There are mixed erosive and productive changes of the proximal interphalangeal joints of the third through fifth digits. There are degenerative changes at the fifth metacarpophalangeal joint. Degenerative changes are noted at the radiocarpal joint. There is no acute displaced fracture or dislocation. IMPRESSION: 1. No acute displaced fracture or dislocation. 2. 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 Electronically Signed   By: Constance Holster M.D.   On: 02/04/2020 20:24   ECHO TEE  Result Date: 02/05/2020    TRANSESOPHOGEAL ECHO REPORT   Patient Name:   [facility] Date of Exam:  02/05/2020 Medical Rec #:  [id number]     Height:       68.0 in Accession #:    [id number]    Weight:       203.5 lb Date of Birth:  August 30, 1961    BSA:          2.059 m Patient Age:    13 years      BP:           92/47 mmHg Patient Gender: M             HR:           81 bpm. Exam Location:  Inpatient Procedure: 2D Echo, Color Doppler and Cardiac Doppler Indications:     bacteremia 790.7  History:         Patient has prior history of Echocardiogram examinations, most                  recent 02/02/2020.  Sonographer:     Philipp Deputy Referring Phys:  [id number] WEAVER Diagnosing Phys: Cherlynn Kaiser MD PROCEDURE: After discussion of the risks and benefits of a TEE, an informed consent was obtained from the patient. The transesophogeal probe was passed without  difficulty through the esophogus of the patient. Local oropharyngeal anesthetic was provided with Cetacaine. Sedation performed by different physician. The patient was monitored while under deep sedation. Image quality was adequate. The patient's vital signs; including heart rate, blood pressure, and oxygen saturation; remained stable throughout  the procedure. The patient developed no complications during the procedure. IMPRESSIONS  1. Left ventricular ejection fraction, by estimation, is 60 to 65%. The left ventricle has normal function. The left ventricle has no regional wall motion abnormalities.  2. Right ventricular systolic function is normal. The right ventricular size is normal.  3. Left atrial size was mildly dilated. No left atrial/left atrial appendage thrombus was detected.  4. 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 Mild mitral valve regurgitation.  5. The aortic valve is normal in structure. Aortic valve regurgitation is not visualized.  6. There is mild (Grade II) atheroma plaque involving the transverse and descending aorta.  7. Evidence of atrial level shunting detected by color flow Doppler. Agitated saline  contrast bubble study was positive with shunting observed within 3-6 cardiac cycles suggestive of interatrial shunt. There is a tiny patent foramen ovale with predominantly right to left shunting across the atrial septum. Conclusion(s)/Recommendation(s): No evidence of vegetation/infective endocarditis on this transesophageal echocardiogram. FINDINGS  Left Ventricle: Left ventricular ejection fraction, by estimation, is 60 to 65%. The left ventricle has normal function. The left ventricle has no regional wall motion abnormalities. The left ventricular internal cavity size was normal in size. There is  no left ventricular hypertrophy. Right Ventricle: The right ventricular size is normal. No increase in right ventricular wall thickness. Right ventricular systolic function is normal. Left Atrium: Left atrial size was mildly dilated. No left atrial/left atrial appendage thrombus was detected. Right Atrium: Right atrial size was normal in size. Pericardium: Trivial pericardial effusion is present. 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 Normal mobility of the mitral valve leaflets. Mild mitral valve regurgitation. Tricuspid Valve: The tricuspid valve is normal in structure. Tricuspid valve regurgitation is trivial. No evidence of tricuspid stenosis. Aortic Valve: The aortic valve is normal in structure. Aortic valve regurgitation is not visualized. Pulmonic Valve: The pulmonic valve was normal in structure. Pulmonic valve regurgitation is trivial. Aorta: The aortic root and ascending aorta are structurally normal, with no evidence of dilitation. There is mild (Grade II) atheroma plaque involving the transverse and descending aorta. IAS/Shunts: The interatrial septum appears to be lipomatous. Evidence of atrial level shunting detected by color flow Doppler. Agitated saline contrast was given intravenously to evaluate for intracardiac shunting. Agitated saline contrast bubble study was positive with shunting  observed within 3-6 cardiac cycles suggestive of interatrial shunt. A tiny patent foramen ovale is detected with predominantly right to left shunting across the atrial septum.   AORTA Ao Asc diam: 3.10 cm Cherlynn Kaiser MD Electronically signed by Cherlynn Kaiser MD Signature Date/Time: 02/05/2020/8:13:15 PM    Final    [place] EKG SITE RITE  Result Date: 02/06/2020 If Site Rite image not attached, placement could not be confirmed due to current cardiac rhythm.       Scheduled Meds: . Chlorhexidine Gluconate Cloth  6 each Topical Daily  . feeding supplement (GLUCERNA SHAKE)  237 mL Oral TID BM  . insulin aspart  0-15 Units Subcutaneous TID WC  . insulin aspart  0-5 Units Subcutaneous QHS  . insulin detemir  8 Units Subcutaneous BID  . insulin starter kit- pen needles  1 kit Other Once  .  multivitamin with minerals  1 tablet Oral Daily  . mupirocin ointment  1 application Nasal BID  . senna-docusate  1 tablet Oral BID  . sodium chloride flush  3 mL Intravenous Q12H  . tamsulosin  0.4 mg Oral Daily  . vitamin B-12  1,000 mcg Oral Daily   Continuous Infusions: . sodium chloride 10 mL/hr at 02/05/20 1521  .  ceFAZolin (ANCEF) IV 2 g (02/06/20 1316)  . lactated ringers Stopped (02/03/20 1146)  . methocarbamol (ROBAXIN) IV         LOS: 5 days    Assessment/Plan: Sepsis /MSSA bacteremia due to bilateral foot osteomyelitis/MSSA UTI-He is started on Ancef Leukocytosis WBC 24.6 on presentation, down to 12 k now. Repeat blood culture  on 3/6, no growth so far. TEE done 3/9--no valvular agitations noted, nl EF with tiny PFO with right to left shunt.  Back pain work-up however indicates discitis (see below).  ID and other specialty inputs appreciated. PICC line for prolonged abx course given discitis.   Bilateral foot osteomyelitis-s/p debridement of the ulcer on the left great toe S/p transmetatarsal Ray amputation of right foot with wound VAC application on 3/6. S/P Irrigation and debridement  of left toe.Orthopedic/ Dr. 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  He has mild neck pain which has been improving.  No numbness or weakness in his arms or hands, but does occasionally get a shooting pain in his right arm.  Neurosurgery input from Dr Marcello Moores appreciated--per NS review of MRI- there are early discitis changes with retropharyngeal or prevertebral inflammation as was as thin epidural phlegmon eccentric to the right without any significant cord impingement. NS recommends 6 wks-3 months antibiotics and repeat MR C-Spine with/without contrast in 6 weeks, or sooner if progressive myelopathy develops. If develops worse back pain, will need MR L-spine W/WO contrast.  Scalene Myositis: Prevertebral edema with right-sided paravertebral fluid collections in the mid to lower cervical spine concerning for infection and abscesses with suspected myositis involving the right-sided scalene musculature noted on MRI. Case discussed with ENT Dr. Wilburn Cornelia who recommend  antibiotic treatment, no surgical intervention needed  Urinary retention: Not sure if related to laying in bed, pain meds or spinal issue related neurogenic bladder. Placed on indwelling Foley as noted to have 700 ml on bladder scan on 3/8 afternoon.  AKI- secondary to ATN in the setting of systemic infection. Renal ultrasound 3/5 showed no obstruction /hydronephrosis. Retaining urine now, monitor after foley placement.  Urine protein at 100. May have underlying diabetic nephropathy--not seen PCP in many years.  Renal dosing meds, continue IV hydration for now given improving renal function and no signs of fluid overload.Marland Kitchen  Hyponatremia-likely hypovolemic hyponatremia.  He has been on isotonic fluids-LR at 100/h since 3/4.  Sodium level improved from 120 ->121->124->126->125->127 today. Urine osmolality greater than serum osmolality.  Urine  sodium at 66 (obtained after patient been on normal saline).  Hypomagnesemia-Replaced mag x1 on 3/6 AND 2g 3/7, repeat level at 2.0   DM, uncontrolled-Apparently did not believe he had DM and so didn't go back to a physician since about 2008-DM coordinator consult-A1c is 11.3- been on Levemir to 8 units twice daily--changed to 70/30 per Diab educator recommendations, continue SSI.  Transaminitis: primarily ALP elevated which could be of bony source as T bili okay. Will check GGT. Hepatitis panel negative. CK unremarkable. Abdominal ultrasound showed cholelithiasis but no obstruction. .Improving with treating sepsis  Microcytic anemia-Hgb 9.8 ,MCV 68.8.UA showed 10-20 RBC/HPF. Reticulocyte count inappropriately low, B12 low normal, folate wnl, tsh wnl, iron panel pending. Stool occult blood pending collection/Monitor CBC  History of delusional disorder, impaired memory-MRI brain showed mild cerebral atrophy and chronic small vessel ischemia. Currently calm, oriented x3, does has tangential thought process HIV negative,  Frequent falls-Fall precaution, PT eval.  On B12 supplement. Neurology consulted on presentation, neurology sent neuropathy labs, B1 /B6 levels pending, methylmalonic acid level pending, please follow-up result. States was living alone. Sister/Mother NOK. May need rehab as also on wound vac/needs long term abx x 6 weeks given spinal involvement.   Severe Protein calorie malnutrition: in the setting of severe systemic infection. Albumin level <2. Dietician consult.  .    DVT prophylaxis: Held Lovenox for procedure, SCD Code Status: Full code Family / Patient Communication: Discussed with patient and the above plan, lab abnormalities, disposition plan explained.  All questions answered to satisfaction. Called sister and updated plan--she stated she will f/u with CM regarding insurance barriers Disposition Plan:   Patient is from home prior to  hospitalization. Received/Receiving inpatient care for  Systemic MSSA infection, bacteremia, osteomyelitis/discitis/urinary retention .  Discharge to skilled nursing facility per PT recommendations when medically optimised (renal function/sodium levels close to normal) and cleared for d/c by consultants -ordered PICC line for long term abx as blood cx from 3/6 so far negative.  Also on wound VAC currently.  Consultants:  ID  Neurology  Cardiology for TEE  Neurosurgery  Orthopedics    Time spent: 35 minutes    Guilford Shi, MD [facility] Pager in [place]  If 7PM-7AM, please contact night-coverage [email or web address] 02/06/2020, 2:58 PM

## 2020-02-06 NOTE — Progress Notes (Signed)
Inpatient Diabetes Program Recommendations  AACE/ADA: New Consensus Statement on Inpatient Glycemic Control (2015)  Target Ranges:  Prepandial:   less than 140 mg/dL      Peak postprandial:   less than 180 mg/dL (1-2 hours)      Critically ill patients:  140 - 180 mg/dL   Lab Results  Component Value Date   GLUCAP 179 (H) 02/06/2020   HGBA1C 11.3 (H) 02/01/2020    Review of Glycemic Control Results for Chris Bowman, Chris Bowman (MRN [id number]) as of 02/06/2020 14:30  Ref. Range 02/05/2020 16:26 02/05/2020 21:22 02/06/2020 07:05 02/06/2020 11:10  Glucose-Capillary Latest Ref Range: 70 - 99 mg/dL 172 (H) 164 (H) 156 (H) 179 (H)   Diabetes history: Type 2 DM Outpatient Diabetes medications: none Current orders for Inpatient glycemic control: Levemir 8 units BID, Novolog 0-15 units TID, Novolog 0-5 units QHS  Inpatient Diabetes Program Recommendations:    Met with patient again to further discuss diabetes and to ensure understanding of safety interventions and survival skills.  Patient provided a meter at bedside. Reviewed frequency of when to check. Patient will need easy schedule.  Reviewed again with patient on insulin pen use at home. Reviewed contents of insulin flexpen starter kit. Reviewed all steps if insulin pen including attachment of needle, 2-unit air shot, dialing up dose, giving injection, removing needle, disposal of sharps, storage of unused insulin, disposal of insulin etc. Patient unable to provide successful return demonstration. Rn to continue working with patient to assess ability to grow in skill. During teach back on hypoglycemia, patient states, "Yea if I am low I will give myself some of that epi pen." Did not answer correctly to correcting hypoglycemia. Concerned for patient's ability to think critically thorugh insulin administration and correct hypoglycemia.   MD informed of situation and plan for 70/30 while inpatient to evaluate risk for lows. To begin tonight with  dinner. Discussed with RN, to evaluate assessment. RN to continue working with patient on skills and reviewing. Will plan to reach out to sister, who may can provide cues by phone to assist.   Addendum: Spoke with patient's sister Chris Bowman, who has already been discussing with other family members meal planning and working together to help her brother manage his diabetes.  Reviewed patient's current A1c of 11.3%. Explained what a A1c is and what it measures. Also reviewed goal A1c with patient, importance of good glucose control @ home, and blood sugar goals. Reviewed patho of DM, role of pancreas, need for insulin, survival skills, interventions, impact of glucose to kidney function and risk for further infection, vascular changes and commorbidites. Reviewed frequency of when to check blood sugars and when to call MD. Informed that patient has a meter at bedside.  Reviewed in detail survival skills and interventions. Sister feels that her brother would be able to be cued towards interventions and what to do.  Discussed sick day rules and glucose tabs.  Reviewed alternatives for sugary beverages, carb counting and reading food labels. Dietitian contacted while sister at bedside for additional discussion. Plans to have meals prepped based on carb allotment per meal/snacks, labeled and pre-drawn vial/syringe injection for appropriate meals.  Encouraged to read through [facility] and to share with other family members. For the first few injections at home, discussed importance of observing brother perform skills with plan, to further ensure safety. Discussed Relion products and attachments to DC summary and to anticipate PCP visit for follow up prior to discharge.  RN to review with [name] vial and  syringe option.   Thanks, Bronson Curb, MSN, RNC-OB Diabetes Coordinator [phone] (8a-5p)

## 2020-02-06 NOTE — TOC Initial Note (Addendum)
Transition of Care [facility]) - Initial/Assessment Note    Patient Details  Name: Chris Bowman MRN: [id number] Date of Birth: 09/27/1961  Transition of Care [facility]) CM/SW Contact:    Chris Goldmann, LCSW Phone Number: 02/06/2020, 11:14 AM  Clinical Narrative On 3/8 CSW initially talked with patient alone at the bedside and later with patient and sister Chris Bowman regarding his discharge disposition and the recommendation of ST rehab. Chris Bowman reported that he does have a pending Medicaid application. When asked, patient reported that he lives alone and had bed falling the week prior to coming to the hospital. Chris Bowman also indicated that he has never had [facility] services and has not been to a facility for ST rehab before. 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  CSW visited with patient and sister later same date (3:50 pm) and initially and began conversation with Chris Bowman at the bedside, and soon after patient requested that we leave the room so that he could rest. Chris Bowman reported that he has no income but receives section 8 and gets a check that helps him pay his light bill, as his water bill is included in the rent. When asked, patient responded that he gets food stamps and has a free phone through [employer].   Per sister, Chris Bowman reported that her brother was very independent prior to this hospitalization. She added that her was very weak and shaking when she brought him to the hospital and she was not aware of the wounds on his feet, and just learned that he is diabetic. Sister added that her brother has been "kind of off" due to the surgery and meds he is on. She reported that they have a brother Chris Bowman who is not involved with the family, and a younger sister Chris Bowman who lives out of town. They also have a nephew (Chris Bowman) who is involved with the family. CSW informed by the sister that her brother has  2 daughters that live in [place], they are in their early 20's and her brother does communicate with his youngest daughter. ChrisChris Bowman added that she looks after their parents and their disabled nephew.   Chris Bowman indicated that Chris Bowman Medicaid application was completed on Tuesday and sister added that she signed some paperwork for patient, but was not sure what it was. CSW also asked about patient applying for disability and she is not sure if this has been done.  Talked with sister regarding recommendation for ST rehab and how her brother being able to discharge to a skilled facility is impacted because he has no insurance, and she expressed understanding. Chris Bowman explained that she plans to check in on patient more at home, and assist with meal preparation to ensure he eats right, but cannot be with him for significant periods of time and CSW expressed understanding. She added that her nephew could help with chores, lifting, etc. Chris Bowman also indicated that patient can live with her because she lives on the 3rd floor of an apartment complex. CSW talked with sister regarding the challenges of finding placement as patient has no insurance and that she and [name] will be kept informed of discharge plan.      Contact had been made with the financial counselor via e-mail on 3/8 to determine if patient has a  pending MA application. Financial counselor Chris Bowman responded on 3/9 and  indicated that patient does not have a pending MA application on file with DSS, confirmed via contact with DSS. Chris Bowman added that she has attempted to contact patient by phone to complete a screening, but did not receive and answer when calling his room phone. Chris Bowman indicated that she would keep trying to contact patient to see if she can assist with Medicaid and [facility].  CSW will f/u with patient regarding the Medicaid and disability applications and will update his sister.         Expected  Discharge Plan: Skilled Nursing Facility Barriers to Discharge: Continued Medical Work up   Patient Goals and CMS Choice Patient states their goals for this hospitalization and ongoing recovery are:: Patient wants to get better before being discharged CMS Medicare.gov Compare Post Acute Care list provided to:: Other (Comment Required)(Medicare.gov list not provided at this time wedicar) Choice offered to / list presented to : NA(Medicare.gov list not provided to patient or sister during the initial assessment)  Expected Discharge Plan and Services Expected Discharge Plan: Skilled Nursing Facility In-house Referral: Clinical Social Work     Living arrangements for the past 2 months: Apartment                                      Prior Living Arrangements/Services Living arrangements for the past 2 months: Apartment Lives with:: Self Patient language and need for interpreter reviewed:: No Do you feel safe going back to the place where you live?: No(Patient aware that he needs help once he discharges from the hospital)      Need for Family Participation in Patient Care: Yes (Comment) Care giver support system in place?: No (comment)   Criminal Activity/Legal Involvement Pertinent to Current Situation/Hospitalization: No - Comment as needed  Activities of Daily Living Home Assistive Devices/Equipment: None ADL Screening (condition at time of admission) Patient's cognitive ability adequate to safely complete daily activities?: Yes Is the patient deaf or have difficulty hearing?: No Does the patient have difficulty seeing, even when wearing glasses/contacts?: Yes Does the patient have difficulty concentrating, remembering, or making decisions?: Yes Patient able to express need for assistance with ADLs?: Yes Does the patient have difficulty dressing or bathing?: Yes Independently performs ADLs?: Yes (appropriate for developmental age) Does the patient have difficulty walking  or climbing stairs?: Yes Weakness of Legs: Both Weakness of Arms/Hands: None  Permission Sought/Granted Permission sought to share information with : Family Supports Permission granted to share information with : Yes, Verbal Permission Granted  Share Information with NAME: Tim Lair     Permission granted to share info w Relationship: Sister  Permission granted to share info w Contact Information: [phone]  Emotional Assessment Appearance:: Appears stated age Attitude/Demeanor/Rapport: Engaged Affect (typically observed): Appropriate Orientation: : Oriented to Self, Oriented to Place, Oriented to  Time, Oriented to Situation Alcohol / Substance Use: Never Used Psych Involvement: No (comment)  Admission diagnosis:  Hyponatremia [E87.1] Pain [R52] Shoulder pain [M25.519] Generalized weakness [R53.1] AKI (acute kidney injury) ([place]) [N17.9] Sepsis due to undetermined organism ([place]) [A41.9] Fever, unspecified fever cause [R50.9] Type 2 diabetes mellitus with hyperglycemia, without long-term current use of insulin ([place]) [E11.65] Patient Active Problem List   Diagnosis Date Noted  . AKI (acute kidney injury) ([place])   . Fever   . Staphylococcus aureus bacteremia   . Osteomyelitis of foot, right, acute ([place])   . Type 2 diabetes mellitus with  hyperglycemia, without long-term current use of insulin ([facility]) 02/01/2020  . Sepsis due to undetermined organism ([facility]) 02/01/2020  . Ulcer of left foot due to type 2 diabetes mellitus ([facility]) 02/01/2020  . Hyponatremia 02/01/2020  . Renal insufficiency 02/01/2020  . Weakness 02/01/2020  . Falls frequently 02/01/2020   PCP:  Patient, No Pcp Per Pharmacy:   [facility] [phone] - [place],  - [address] AT [facility] [address] [place] [address] Phone: [phone] Fax: [phone]     Social Determinants of Health (SDOH) Interventions    Readmission Risk Interventions No  flowsheet data found.

## 2020-02-06 NOTE — Plan of Care (Signed)
  Problem: Education: Goal: Knowledge of General Education information will improve Description: Including pain rating scale, medication(s)/side effects and non-pharmacologic comfort measures Outcome: Completed/Met   Problem: Clinical Measurements: Goal: Diagnostic test results will improve Outcome: Completed/Met Goal: Respiratory complications will improve Outcome: Completed/Met Goal: Cardiovascular complication will be avoided Outcome: Completed/Met   Problem: Nutrition: Goal: Adequate nutrition will be maintained Outcome: Completed/Met   Problem: Elimination: Goal: Will not experience complications related to bowel motility Outcome: Completed/Met Goal: Will not experience complications related to urinary retention Outcome: Completed/Met   Problem: Pain Managment: Goal: General experience of comfort will improve Outcome: Completed/Met   Problem: Safety: Goal: Ability to remain free from injury will improve Outcome: Completed/Met

## 2020-02-06 NOTE — Progress Notes (Signed)
Patient couldn't draw insulin from the vial as he has vision issues and also couldn't see clearly how many units he drew, helped him with it, but was able to administer two types of insulin.  Patient's sister at bedside said that she can help him to pre fill Novolog 70/30 and leave it for patient.  Currently he needs someone to monitor him as he was needing cues while administering.

## 2020-02-06 NOTE — Progress Notes (Signed)
PHARMACY CONSULT NOTE FOR:  OUTPATIENT  PARENTERAL ANTIBIOTIC THERAPY (OPAT)  Indication: MSSA bacteremia and diabetic foot osteo Regimen: cefazolin 2 gm IV q8h End date: 03/16/2020  IV antibiotic discharge orders are pended. To discharging provider:  please sign these orders via discharge navigator,  Select New Orders & click on the button choice - Manage This Unsigned Work.     Thank you for allowing pharmacy to be a part of this patient's care.  Lulu Riding, PharmD PGY1 Pharmacy Resident  Please check AMION for all [facility] phone numbers After 10:00 PM, call Main Pharmacy [phone]  02/06/2020, 9:33 AM

## 2020-02-06 NOTE — Progress Notes (Signed)
  RD consulted for nutrition education regarding diabetes.   Lab Results  Component Value Date   HGBA1C 11.3 (H) 02/01/2020    RD provided "Carbohydrate Counting for People with Diabetes" handout from the Academy of Nutrition and Dietetics. Discussed different food groups and their effects on blood sugar, emphasizing carbohydrate-containing foods. Pt able to tell writer that potatoes contain carbs but could not tell Clinical research associate any other foods. Pt reports he loves Coca Cola Classic and understands that Diet Neita Carp has a role but that it's not for him. Explained importance of limiting sugary sweetened beverages. Provided list of carbohydrates and recommended serving sizes of common foods.   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 Pt did not appear to grasp basic concepts of diabetic diet. Also noted, DM coordinator during education indicating "question patient's ability to think critically through insulin administration" and "patient unable to provide successful return demonstration."    Recommend considering outpatient education at discharge; pt reports no prior outpatient education and intreested in education at discharge. Explained importance of managing DM with regards to wound healing and pt stated "he has lots of wounds to heal" and agrees that it is important.   Also noted sister coming into hospital; spoke with nursing staff who indicate pt stating Sister not coming today. Plan for nursing staff to notify RD when sister available for possible DM education  Romelle Starcher MS, RDN, LDN, CNSC RD Pager Number and Weekend/On-Call After Hours Pager Located in [place]

## 2020-02-06 NOTE — Progress Notes (Signed)
Physical Therapy Treatment Patient Details Name: Chris Bowman MRN: [id number] DOB: March 03, 1961 Today's Date: 02/06/2020    History of Present Illness 59 y.o. male was admitted with bacteremia and received TEE to rule out endocarditis causing sepsis.  His R foot received transmet amp on 02/03/20 and L great toe wound I and D.  Has vac on R foot now, referred to PT for mobility.  PMHx:  uncontrolled diabetes, essential HTN, delusional disorders. bilateral feet wounds with early indication of osteomyelitis per MRI results from 3/4    PT Comments    Pt was seen for mobility and was observed to have a struggle with NWB on RLE.  He is wearing an ortho shoe on R foot and athletic shoe on LLE with better maintenance of pressure relief on RW.  Pt requires two person help to get OOB, to stand and to get to chair, so recommended to nursing to either to AP transfer to bed or use maxi move.  Follow acutely as scheduled.   Follow Up Recommendations  SNF     Equipment Recommendations  None recommended by PT    Recommendations for Other Services       Precautions / Restrictions Precautions Precautions: Fall Precaution Comments: NWB on RLE and LLE WBAT Required Braces or Orthoses: Other Brace Other Brace: R foot ortho shoe Restrictions Weight Bearing Restrictions: Yes RLE Weight Bearing: Touchdown weight bearing LLE Weight Bearing: Weight bearing as tolerated Other Position/Activity Restrictions: ortho shoe R foot    Mobility  Bed Mobility Overal bed mobility: Needs Assistance Bed Mobility: Supine to Sit;Rolling Rolling: Mod assist   Supine to sit: Mod assist     General bed mobility comments: mod assist to lift trunk with a difficult transition due to spinal pain  Transfers Overall transfer level: Needs assistance Equipment used: Rolling walker (2 wheeled) Transfers: Sit to/from Stand Sit to Stand: +2 physical assistance;+2 safety/equipment;From elevated surface;Max assist             Ambulation/Gait Ambulation/Gait assistance: Mod assist;+2 physical assistance;+2 safety/equipment Gait Distance (Feet): 4 Feet Assistive device: Rolling walker (2 wheeled);2 person hand held assist   Gait velocity: reduced Gait velocity interpretation: <1.8 ft/sec, indicate of risk for recurrent falls General Gait Details: pt wgt shifted and hopped a few times on LLE to maintain WB limits on RLE   Stairs             Wheelchair Mobility    Modified Rankin (Stroke Patients Only)       Balance Overall balance assessment: Needs assistance Sitting-balance support: Feet supported;Bilateral upper extremity supported Sitting balance-Leahy Scale: Fair Sitting balance - Comments: fair once set Postural control: Posterior lean Standing balance support: Bilateral upper extremity supported;During functional activity Standing balance-Leahy Scale: Poor Standing balance comment: takes extra time to release bed rail with LUE to shift to walker                            Cognition Arousal/Alertness: Awake/alert Behavior During Therapy: Flat affect Overall Cognitive Status: No family/caregiver present to determine baseline cognitive functioning                                 General Comments: limited ability to control WB on RLE in standing without careful cues      Exercises      General Comments General comments (skin integrity, edema, etc.): pt is  transferred to chair with BLE's elevated by pillows, and note his struggle with getting comfortable, which was achived with pillows for spine and legs      Pertinent Vitals/Pain Pain Assessment: Faces Faces Pain Scale: Hurts even more Pain Location: neck and back, generalized Pain Descriptors / Indicators: Guarding;Tightness Pain Intervention(s): Limited activity within patient's tolerance;Monitored during session;Premedicated before session;Repositioned    Home Living                       Prior Function            PT Goals (current goals can now be found in the care plan section) Acute Rehab PT Goals Patient Stated Goal: to go home Progress towards PT goals: Progressing toward goals    Frequency    Min 3X/week      PT Plan Current plan remains appropriate    Co-evaluation PT/OT/SLP Co-Evaluation/Treatment: Yes Reason for Co-Treatment: Complexity of the patient's impairments (multi-system involvement);For patient/therapist safety;To address functional/ADL transfers;Necessary to address cognition/behavior during functional activity PT goals addressed during session: Mobility/safety with mobility;Balance;Proper use of DME        AM-PAC PT "6 Clicks" Mobility   Outcome Measure  Help needed turning from your back to your side while in a flat bed without using bedrails?: None Help needed moving from lying on your back to sitting on the side of a flat bed without using bedrails?: A Lot Help needed moving to and from a bed to a chair (including a wheelchair)?: A Lot Help needed standing up from a chair using your arms (e.g., wheelchair or bedside chair)?: A Lot Help needed to walk in hospital room?: A Lot Help needed climbing 3-5 steps with a railing? : Total 6 Click Score: 13    End of Session Equipment Utilized During Treatment: Gait belt Activity Tolerance: Patient limited by pain;Treatment limited secondary to medical complications (Comment) Patient left: in chair;with call bell/phone within reach;with chair alarm set Nurse Communication: Mobility status PT Visit Diagnosis: Unsteadiness on feet (R26.81);Muscle weakness (generalized) (M62.81);Difficulty in walking, not elsewhere classified (R26.2);Adult, failure to thrive (R62.7);Repeated falls (R29.6);Pain Pain - Right/Left: Right Pain - part of body: Ankle and joints of foot     Time: 1033-1101 PT Time Calculation (min) (ACUTE ONLY): 28 min  Charges:  $Therapeutic Activity: 8-22 mins                     ABRAN GAVIGAN 02/06/2020, 12:46 PM  Samul Dada, PT MS Acute Rehab Dept. Number: [facility] [id number] and [facility] [phone]

## 2020-02-06 NOTE — Progress Notes (Signed)
Occupational Therapy Treatment Patient Details Name: Chris Bowman MRN: [id number] DOB: 15-Oct-1961 Today's Date: 02/06/2020    History of present illness 59 y.o. male was admitted with bacteremia and received TEE to rule out endocarditis causing sepsis.  His R foot received transmet amp on 02/03/20 and L great toe wound I and D.  Has vac on R foot now, referred to PT for mobility.  PMHx:  uncontrolled diabetes, essential HTN, delusional disorders. bilateral feet wounds with early indication of osteomyelitis per MRI results from 3/4   OT comments  Pt presents supine in bed agreeable to treatment session. Pt tolerating OOB to recliner during session, requiring two person assist for safe completion of functional transfers using RW. Pt requiring frequent cues for maintaining WB status in RLE. Requiring maxA (+2) for LB ADL. Pt overall tolerating session well. Continue to recommend SNF at time of discharge to maximize his safety and independence with ADL and mobility. Will continue to follow acutely.   Follow Up Recommendations  SNF    Equipment Recommendations  Other (comment);3 in 1 bedside commode(TBD)          Precautions / Restrictions Precautions Precautions: Fall Precaution Comments: NWB on RLE and LLE WBAT Required Braces or Orthoses: Other Brace Other Brace: R foot ortho shoe Restrictions Weight Bearing Restrictions: Yes RLE Weight Bearing: Touchdown weight bearing LLE Weight Bearing: Weight bearing as tolerated Other Position/Activity Restrictions: ortho shoe R foot       Mobility Bed Mobility Overal bed mobility: Needs Assistance Bed Mobility: Supine to Sit;Rolling Rolling: Mod assist   Supine to sit: Mod assist     General bed mobility comments: mod assist to lift trunk   Transfers Overall transfer level: Needs assistance Equipment used: Rolling walker (2 wheeled) Transfers: Sit to/from [employer] Transfers Sit to Stand: +2 physical assistance;+2  safety/equipment;From elevated surface;Max assist Stand pivot transfers: Max assist;+2 safety/equipment;+2 physical assistance       General transfer comment: boosting assist from EOB with increased time required to shift UE to RW from bed; frequent cueing/assist to maintain WB status in RLE    Balance Overall balance assessment: Needs assistance Sitting-balance support: Feet supported;Bilateral upper extremity supported Sitting balance-Leahy Scale: Fair Sitting balance - Comments: once stabilized Postural control: Posterior lean Standing balance support: Bilateral upper extremity supported;During functional activity Standing balance-Leahy Scale: Poor Standing balance comment: takes extra time to release bed rail with LUE to shift to walker                           ADL either performed or assessed with clinical judgement   ADL Overall ADL's : Needs assistance/impaired                     Lower Body Dressing: Maximal assistance;+2 for physical assistance;+2 for safety/equipment;Sit to/from stand;Cueing for safety;Cueing for sequencing Lower Body Dressing Details (indicate cue type and reason): assist to don L sock/shoe and R ortho shoe              Functional mobility during ADLs: Maximal assistance;+2 for physical assistance;+2 for safety/equipment;Rolling walker(stand pivot transfer)       Vision       Perception     Praxis      Cognition Arousal/Alertness: Awake/alert Behavior During Therapy: Flat affect Overall Cognitive Status: No family/caregiver present to determine baseline cognitive functioning  General Comments: requires frequent cues to maintain NWB status, slow to respond to questions at times        Exercises     Shoulder Instructions       General Comments      Pertinent Vitals/ Pain       Pain Assessment: Faces Faces Pain Scale: Hurts even more Pain Location: neck and back,  generalized Pain Descriptors / Indicators: Guarding;Tightness Pain Intervention(s): Limited activity within patient's tolerance;Monitored during session;Repositioned  Home Living                                          Prior Functioning/Environment              Frequency  Min 2X/week        Progress Toward Goals  OT Goals(current goals can now be found in the care plan section)  Progress towards OT goals: Progressing toward goals  Acute Rehab OT Goals Patient Stated Goal: to go home OT Goal Formulation: With patient Time For Goal Achievement: 02/16/20 Potential to Achieve Goals: Good  Plan Discharge plan remains appropriate    Co-evaluation    PT/OT/SLP Co-Evaluation/Treatment: Yes Reason for Co-Treatment: Complexity of the patient's impairments (multi-system involvement);For patient/therapist safety;To address functional/ADL transfers   OT goals addressed during session: ADL's and self-care      AM-PAC OT "6 Clicks" Daily Activity     Outcome Measure   Help from another person eating meals?: None Help from another person taking care of personal grooming?: A Little Help from another person toileting, which includes using toliet, bedpan, or urinal?: A Lot Help from another person bathing (including washing, rinsing, drying)?: A Lot Help from another person to put on and taking off regular upper body clothing?: A Little Help from another person to put on and taking off regular lower body clothing?: A Lot 6 Click Score: 16    End of Session Equipment Utilized During Treatment: Gait belt;Rolling walker  OT Visit Diagnosis: Unsteadiness on feet (R26.81);Other abnormalities of gait and mobility (R26.89);History of falling (Z91.81);Muscle weakness (generalized) (M62.81);Other symptoms and signs involving cognitive function   Activity Tolerance Patient tolerated treatment well   Patient Left in chair;with call bell/phone within reach;with chair  alarm set   Nurse Communication Mobility status;Need for lift equipment        Time: 1033-1101 OT Time Calculation (min): 28 min  Charges: OT General Charges $OT Visit: 1 Visit OT Treatments $Self Care/Home Management : 8-22 mins  Chris Bowman, OT Acute Rehabilitation Services Pager [phone] Office [phone]    Chris Bowman 02/06/2020, 5:18 PM

## 2020-02-07 LAB — COMPREHENSIVE METABOLIC PANEL
ALT: 100 U/L — ABNORMAL HIGH (ref 0–44)
AST: 212 U/L — ABNORMAL HIGH (ref 15–41)
Albumin: 1.6 g/dL — ABNORMAL LOW (ref 3.5–5.0)
Alkaline Phosphatase: 241 U/L — ABNORMAL HIGH (ref 38–126)
Anion gap: 11 (ref 5–15)
BUN: 26 mg/dL — ABNORMAL HIGH (ref 6–20)
CO2: 25 mmol/L (ref 22–32)
Calcium: 8.3 mg/dL — ABNORMAL LOW (ref 8.9–10.3)
Chloride: 91 mmol/L — ABNORMAL LOW (ref 98–111)
Creatinine, Ser: 1.35 mg/dL — ABNORMAL HIGH (ref 0.61–1.24)
GFR calc Af Amer: >60 mL/min (ref >60)
GFR calc non Af Amer: 58 mL/min — ABNORMAL LOW (ref >60)
Glucose, Bld: 178 mg/dL — ABNORMAL HIGH (ref 70–99)
Potassium: 4.7 mmol/L (ref 3.5–5.1)
Sodium: 127 mmol/L — ABNORMAL LOW (ref 135–145)
Total Bilirubin: 0.4 mg/dL (ref 0.3–1.2)
Total Protein: 6.3 g/dL — ABNORMAL LOW (ref 6.5–8.1)

## 2020-02-07 LAB — CBC WITH DIFFERENTIAL/PLATELET
Abs Immature Granulocytes: 0.21 10*3/uL — ABNORMAL HIGH (ref 0.00–0.07)
Basophils Absolute: 0.1 10*3/uL (ref 0.0–0.1)
Basophils Relative: 1 %
Eosinophils Absolute: 0.2 10*3/uL (ref 0.0–0.5)
Eosinophils Relative: 2 %
HCT: 29 % — ABNORMAL LOW (ref 39.0–52.0)
Hemoglobin: 9.3 g/dL — ABNORMAL LOW (ref 13.0–17.0)
Immature Granulocytes: 2 %
Lymphocytes Relative: 12 %
Lymphs Abs: 1.4 10*3/uL (ref 0.7–4.0)
MCH: 22.4 pg — ABNORMAL LOW (ref 26.0–34.0)
MCHC: 32.1 g/dL (ref 30.0–36.0)
MCV: 69.9 fL — ABNORMAL LOW (ref 80.0–100.0)
Monocytes Absolute: 1.1 10*3/uL — ABNORMAL HIGH (ref 0.1–1.0)
Monocytes Relative: 9 %
Neutro Abs: 8.6 10*3/uL — ABNORMAL HIGH (ref 1.7–7.7)
Neutrophils Relative %: 74 %
Platelets: 440 10*3/uL — ABNORMAL HIGH (ref 150–400)
RBC: 4.15 MIL/uL — ABNORMAL LOW (ref 4.22–5.81)
RDW: 15 % (ref 11.5–15.5)
WBC: 11.6 10*3/uL — ABNORMAL HIGH (ref 4.0–10.5)
nRBC: 0 % (ref 0.0–0.2)

## 2020-02-07 LAB — GLUCOSE, CAPILLARY
Glucose-Capillary: 176 mg/dL — ABNORMAL HIGH (ref 70–99)
Glucose-Capillary: 249 mg/dL — ABNORMAL HIGH (ref 70–99)
Glucose-Capillary: 237 mg/dL — ABNORMAL HIGH (ref 70–99)
Glucose-Capillary: 111 mg/dL — ABNORMAL HIGH (ref 70–99)

## 2020-02-07 LAB — VITAMIN B1: Vitamin B1 (Thiamine): 90.9 nmol/L (ref 66.5–200.0)

## 2020-02-07 MED ORDER — SODIUM CHLORIDE 0.9% FLUSH
10.0000 mL | INTRAVENOUS | Status: DC | PRN
  Administered 2020-02-09: 10 mL

## 2020-02-07 NOTE — Progress Notes (Signed)
PROGRESS NOTE    Chris Bowman  PXT:[id number] DOB: Jun 02, 1961 DOA: 02/01/2020 PCP: Patient, No Pcp Per   Brief Narrative: 59 year old male with UNknown medical history, who has not seen a doctor since thousand 18 presented to the ER on 3/4 with weakness and falls x2 weeks and associated fever dyspnea cough, chronic buttock pain since the fall along with limitation of range of motion, right arm pain.  Apparently diagnosed with diabetes in thousand and but did not think he needed medicine for it, reported history of taking too many baby aspirin and having stomach pain" I have a little incision is scar from that" in the ED afebrile Covid negative WBC 20 4K glucose 300 A1c 11 sodium 120, patient was admitted under hospitalist.  Work-up showed MSSA UTI, bacteremia bilateral foot osteomyelitis with associated sepsis and concern for cervical spine epidural abscess/phlegmon C3-C5 no cord compression, no evidence of discitis or osteomyelitis.  Patient was seen by neurosurgery, infectious disease.  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  Patient is on Ancef as per ID and will need 6 weeks of IV antibiotic, repeat blood culture on 3/6 and PICC line 3/10.On 3/8 urine retention needing in and out cath and subsequently needing Foley catheter.  Subjective: Seen and examined this morning. Sodium same at 127, afebrile overnight, on room air.  Blood sugar is stable 120-150s No new complaints. Had some questions that he needs amputation   Assessment & Plan:  MSSA sepsis due to bilateral foot osteomyelitis/MSSA UTI/cervical C3-C5 osteomyelitis/discitis with epidural phlegmon: Patient with sepsis on presentation.  Etiology as above.  WBC nicely improving.  She is afebrile.  Seen by multiple consultants including ID, orthopedics.  Underwent TEE negative  for endocarditis, normal EF with tiny PFO with right to left shunt, status post I&D of ulcer on the left great toe, s/p TMA ray amputation of right foot with wound VAC placement 3/6/ Possible wound vac removal 3/11 not working properly.  Fell off last night.  Continue Ancef, neurosurgery recommends 6 weeks to 3 months and repeat MRI C-spine with and without contrast in 6 weeks or sooner if progressive myelopathy develops if develops worse back pain will need MRI lumbar spine with and without contrast.  Appreciated Dr. Marcello Moores from neurosurgery as well as infectious disease, Dr Sharol Given input.s.  Back pain is resolved.  Continue Ancef x6 weeks as per Dr. Graylon Good.  Follow-up with ID clinic in 4 weeks.  Scalene Myositis: Prevertebral edema with right-sided paravertebral fluid collections in the mid to lower cervical spine concerning for infection and abscesses with suspected myositis involving the right-sided scalene musculature noted on MRI. Case discussed with ENT Dr. Wilburn Cornelia who recommend antibiotic treatment,no surgical intervention needed  Urine retention continue supportive care.  Placed on indwelling Foley as patient had 700 ml urine on bladder scan on 3/8  AKI secondary to ATN in the setting of sepsis.  No obstruction in the ultrasound.  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  Monitor renal function closely. Creat at 1.3  Type 2 diabetes mellitus with hyperglycemia, without long-term current use of insulin: One 7.3, initially on Levemir insulin 70/30, continue diabetic coordinator follow-up and adjust insulin.  Sugar 136-2 49.  Ulcer of left foot due to type 2 diabetes mellitus cont wound care  Hyponatremia: Likely from hypovolemia. Sodium improving.  Currently 127.  There.  Generalized weakness/frequent fall :  Continue PT OT and will need a skilled nursing facility placement.  SW working on it.  Discussed with Education officer, museum.  Seen by neurology sent neuropathy labs B1 B6 level, MMA  level.  Transaminitis with ALP elevation. AST/ALT also elevated.  Hepatitis panel negative.  Likely from sepsis.  Ultrasound shows cholelithiasis but no obstruction. Monitor.  Microcytic anemia B12 normal folate normal TSH normal iron panel, with elevated ferritin 543, iron low at 12, TIBC low at 174.  Delusional disorder/impaired memory MRI brain showed mild cerebral atrophy and chronic small vessel ischemia.  Currently patient is alert awake oriented, follows commands.  Has some tangential thought process.  HIV screen negative.  Nutrition: Nutrition Problem: Increased nutrient needs Etiology: wound healing Signs/Symptoms: estimated needs Interventions: Glucerna shake, MVI Body mass index is 30.94 kg/m.   DVT prophylaxis:SCD Code Status:full Family Communication: plan of care discussed with patient at bedside. Disposition Plan: Patient is from: Home Anticipated Disposition: to skilled nursing facility Barriers to discharge or conditions that needs to be met prior to discharge: to skilled nursing facility per PT recommendations when medically optimised (renal function/sodium levels close to normal) and cleared for d/c by consultants 3/9 rdered PICC line for long term abx as blood cx from 3/6 so far negative.  Also on wound VAC currently  Consultants: ID, neurology, cardiology for TEE, neurosurgery, orthopedics  Procedures:see note Microbiology: Urine culture MSSA, blood culture MSSA, repeat BLOOD CX 3/6 - so far   Medications: Scheduled Meds:  Chlorhexidine Gluconate Cloth  6 each Topical Daily   feeding supplement (GLUCERNA SHAKE)  237 mL Oral TID BM   insulin aspart  0-15 Units Subcutaneous TID WC   insulin aspart  0-5 Units Subcutaneous QHS   insulin aspart protamine- aspart  8 Units Subcutaneous BID WC   insulin starter kit- pen needles  1 kit Other Once   multivitamin with minerals  1 tablet Oral Daily   mupirocin ointment  1 application Nasal BID   senna-docusate   1 tablet Oral BID   sodium chloride flush  3 mL Intravenous Q12H   tamsulosin  0.4 mg Oral Daily   vitamin B-12  1,000 mcg Oral Daily   Continuous Infusions:  sodium chloride 10 mL/hr at 02/05/20 1521    ceFAZolin (ANCEF) IV 2 g (02/07/20 0546)   lactated ringers Stopped (02/03/20 1146)   methocarbamol (ROBAXIN) IV      Antimicrobials: Anti-infectives (From admission, onward)   Start     Dose/Rate Route Frequency Ordered Stop   02/03/20 1130  ceFAZolin (ANCEF) IVPB 1 g/50 mL premix  Status:  Discontinued     1 g 100 mL/hr over 30 Minutes Intravenous Every 6 hours 02/03/20 1124 02/03/20 1139   02/02/20 1100  vancomycin (VANCOREADY) IVPB 750 mg/150 mL  Status:  Discontinued     750 mg 150 mL/hr over 60 Minutes Intravenous Every 24 hours 02/01/20 1218 02/02/20 0221   02/02/20 0600  ceFAZolin (ANCEF) IVPB 2g/100 mL premix     2 g 200 mL/hr over 30 Minutes Intravenous Every 8 hours 02/02/20 0221     02/01/20 2230  ceFEPIme (MAXIPIME) 2 g in sodium chloride 0.9 % 100 mL IVPB  Status:  Discontinued     2 g 200 mL/hr over 30 Minutes Intravenous Every 12 hours 02/01/20 1218 02/02/20 0221   02/01/20 1200  metroNIDAZOLE (FLAGYL) IVPB 500 mg  Status:  Discontinued     500 mg 100 mL/hr over 60 Minutes Intravenous Every 8 hours 02/01/20 1149 02/02/20  0221   02/01/20 1015  ceFEPIme (MAXIPIME) 2 g in sodium chloride 0.9 % 100 mL IVPB     2 g 200 mL/hr over 30 Minutes Intravenous  Once 02/01/20 1008 02/01/20 1056   02/01/20 1015  vancomycin (VANCOREADY) IVPB 1750 mg/350 mL     1,750 mg 175 mL/hr over 120 Minutes Intravenous  Once 02/01/20 1008 02/01/20 1336     Objective: Vitals: Today's Vitals   02/06/20 1146 02/06/20 1350 02/06/20 2005 02/07/20 0437  BP:  132/70 129/68 134/76  Pulse:  96 90 88  Resp:  _0 Temp:  99.5 F (37.5 C) 99.8 F (37.7 C) 99.5 F (37.5 C)  TempSrc:  Oral Oral Oral  SpO2:   93% 95%  Weight:      Height:      PainSc: 0-No pain 0-No pain 0-No  pain 0-No pain    Intake/Output Summary (Last 24 hours) at 02/07/2020 0820 Last data filed at 02/07/2020 0600 Gross per 24 hour  Intake 1473.43 ml  Output 4170 ml  Net -2696.57 ml   Filed Weights   02/01/20 0801 02/01/20 2300 02/04/20 2107  Weight: 68 kg 83 kg 92.3 kg   Weight change:    Intake/Output from previous day: 03/09 0701 - 03/10 0700 In: 1473.4 [P.O.:940; I.V.:223.5; IV Piggyback:309.9] Out: 4170 [Urine:4150; Drains:20] Intake/Output this shift: No intake/output data recorded.  Examination:  General exam: AAOx3 ,NAD, weak appearing. HEENT:Oral mucosa moist, Ear/Nose WNL grossly,dentition normal. Respiratory system: bilaterally clear,no wheezing or crackles,no use of accessory muscle, non tender. Cardiovascular system: S1 & S2 +, regular, No JVD. Gastrointestinal system: Abdomen soft, NT,ND, BS+. Nervous System:Alert, awake, moving extremities and grossly nonfocal Extremities: No edema, distal peripheral pulses palpable.  Skin: No rashes,no icterus. MSK: Normal muscle bulk,tone, power Rt mid foot amputation with wound vac.   Data Reviewed: I have personally reviewed following labs and imaging studies CBC: Recent Labs  Lab 02/03/20 0323 02/04/20 0410 02/05/20 0500 02/06/20 0421 02/07/20 0347  WBC 16.0* 14.7* 12.8* 12.6* 11.6*  NEUTROABS 12.8* 11.2* 9.8* 9.6* 8.6*  HGB 9.2* 9.3* 9.9* 9.1* 9.3*  HCT 27.5* 29.1* 31.3* 27.8* 29.0*  MCV 67.4* 71.0* 71.6* 69.3* 69.9*  PLT 314 375 404* 411* 125*   Basic Metabolic Panel: Recent Labs  Lab 02/03/20 0323 02/04/20 0410 02/05/20 0500 02/06/20 0421 02/07/20 0347  NA 124* 126* 125* 127* 127*  K 4.1 4.5 4.7 5.0 4.7  CL 93* 92* 92* 93* 91*  CO2 21* _1 GLUCOSE 225* 253* 264* 154* 178*  BUN 31* 24* 24* 24* 26*  CREATININE 1.57* 1.53* 1.54* 1.37* 1.35*  CALCIUM 7.8* 8.1* 8.2* 8.1* 8.3*  MG 1.5* 2.0 2.0  --   --    GFR: Estimated Creatinine Clearance: 66.6 mL/min (A) (by C-G formula based on SCr of  1.35 mg/dL (H)). Liver Function Tests: Recent Labs  Lab 02/03/20 0323 02/04/20 0410 02/05/20 0500 02/06/20 0421 02/07/20 0347  AST 43* 49* 92* 145* 212*  ALT 41 27 41 62* 100*  ALKPHOS 258* 239* 248* 229* 241*  BILITOT 0.5 0.6 0.2* 0.2* 0.4  PROT 6.1* 6.4* 6.8 6.3* 6.3*  ALBUMIN 1.6* 1.8* 1.7* 1.6* 1.6*   Recent Labs  Lab 02/03/20 0323  LIPASE 30   Recent Labs  Lab 02/02/20 1131  AMMONIA 18   Coagulation Profile: Recent Labs  Lab 02/01/20 0930  INR 1.1   Cardiac Enzymes: Recent Labs  Lab 02/01/20 1551 02/03/20 0323  CKTOTAL 179 62  BNP (last 3 results) No results for input(s): PROBNP in the last 8760 hours. HbA1C: No results for input(s): HGBA1C in the last 72 hours. CBG: Recent Labs  Lab 02/06/20 0705 02/06/20 1110 02/06/20 1618 02/06/20 2056 02/07/20 0727  GLUCAP 156* 179* 177* 136* 176*   Lipid Profile: No results for input(s): CHOL, HDL, LDLCALC, TRIG, CHOLHDL, LDLDIRECT in the last 72 hours. Thyroid Function Tests: No results for input(s): TSH, T4TOTAL, FREET4, T3FREE, THYROIDAB in the last 72 hours. Anemia Panel: Recent Labs    02/05/20 0500  FERRITIN 543*  TIBC 174*  IRON 12*   Sepsis Labs: Recent Labs  Lab 02/01/20 0950 02/01/20 1551 02/03/20 0323  PROCALCITON  --  2.66  --   LATICACIDVEN 1.8  --  0.8    Recent Results (from the past 240 hour(s))  Culture, blood (routine x 2)     Status: Abnormal   Collection Time: 02/01/20  9:20 AM   Specimen: BLOOD LEFT ARM  Result Value Ref Range Status   Specimen Description BLOOD LEFT ARM  Final   Special Requests   Final    BOTTLES DRAWN AEROBIC AND ANAEROBIC Blood Culture adequate volume   Culture  Setup Time   Final    IN BOTH AEROBIC AND ANAEROBIC BOTTLES GRAM POSITIVE COCCI IN CLUSTERS CRITICAL RESULT CALLED TO, READ BACK BY AND VERIFIED WITH: Diona Browner PHARMD 02/01/20 2244 JDW Performed at [facility], [place] [address], [place], [place] [address]    Culture STAPHYLOCOCCUS  AUREUS (A)  Final   Report Status 02/04/2020 FINAL  Final   Organism ID, Bacteria STAPHYLOCOCCUS AUREUS  Final      Susceptibility   Staphylococcus aureus - MIC*    CIPROFLOXACIN <=0.5 SENSITIVE Sensitive     ERYTHROMYCIN <=0.25 SENSITIVE Sensitive     GENTAMICIN <=0.5 SENSITIVE Sensitive     OXACILLIN <=0.25 SENSITIVE Sensitive     TETRACYCLINE <=1 SENSITIVE Sensitive     VANCOMYCIN 1 SENSITIVE Sensitive     TRIMETH/SULFA <=10 SENSITIVE Sensitive     CLINDAMYCIN <=0.25 SENSITIVE Sensitive     RIFAMPIN <=0.5 SENSITIVE Sensitive     Inducible Clindamycin NEGATIVE Sensitive     * STAPHYLOCOCCUS AUREUS  Culture, blood (routine x 2)     Status: Abnormal   Collection Time: 02/01/20  9:30 AM   Specimen: BLOOD LEFT HAND  Result Value Ref Range Status   Specimen Description BLOOD LEFT HAND  Final   Special Requests   Final    BOTTLES DRAWN AEROBIC ONLY Blood Culture results may not be optimal due to an inadequate volume of blood received in culture bottles   Culture  Setup Time   Final    AEROBIC BOTTLE ONLY GRAM POSITIVE COCCI IN CLUSTERS Organism ID to follow CRITICAL RESULT CALLED TO, READ BACK BY AND VERIFIED WITH: V BRYK PHARMD 02/02/20 0213 JDW    Culture (A)  Final    STAPHYLOCOCCUS AUREUS SUSCEPTIBILITIES PERFORMED ON PREVIOUS CULTURE WITHIN THE LAST 5 DAYS. Performed at [facility], [place] [address], [place], [place] [address]    Report Status 02/04/2020 FINAL  Final  Blood Culture ID Panel (Reflexed)     Status: Abnormal   Collection Time: 02/01/20  9:30 AM  Result Value Ref Range Status   Enterococcus species NOT DETECTED NOT DETECTED Final   Listeria monocytogenes NOT DETECTED NOT DETECTED Final   Staphylococcus species DETECTED (A) NOT DETECTED Final    Comment: CRITICAL RESULT CALLED TO, READ BACK BY AND  VERIFIED WITH: V BRYK PHARMD 02/02/20 0213 JDW    Staphylococcus aureus (BCID) DETECTED (A) NOT DETECTED Final    Comment: Methicillin (oxacillin) susceptible  Staphylococcus aureus (MSSA). Preferred therapy is anti staphylococcal beta lactam antibiotic (Cefazolin or Nafcillin), unless clinically contraindicated. CRITICAL RESULT CALLED TO, READ BACK BY AND VERIFIED WITH: V BRYK PHARMD 02/02/20 0213 JDW    Methicillin resistance NOT DETECTED NOT DETECTED Final   Streptococcus species NOT DETECTED NOT DETECTED Final   Streptococcus agalactiae NOT DETECTED NOT DETECTED Final   Streptococcus pneumoniae NOT DETECTED NOT DETECTED Final   Streptococcus pyogenes NOT DETECTED NOT DETECTED Final   Acinetobacter baumannii NOT DETECTED NOT DETECTED Final   Enterobacteriaceae species NOT DETECTED NOT DETECTED Final   Enterobacter cloacae complex NOT DETECTED NOT DETECTED Final   Escherichia coli NOT DETECTED NOT DETECTED Final   Klebsiella oxytoca NOT DETECTED NOT DETECTED Final   Klebsiella pneumoniae NOT DETECTED NOT DETECTED Final   Proteus species NOT DETECTED NOT DETECTED Final   Serratia marcescens NOT DETECTED NOT DETECTED Final   Haemophilus influenzae NOT DETECTED NOT DETECTED Final   Neisseria meningitidis NOT DETECTED NOT DETECTED Final   Pseudomonas aeruginosa NOT DETECTED NOT DETECTED Final   Candida albicans NOT DETECTED NOT DETECTED Final   Candida glabrata NOT DETECTED NOT DETECTED Final   Candida krusei NOT DETECTED NOT DETECTED Final   Candida parapsilosis NOT DETECTED NOT DETECTED Final   Candida tropicalis NOT DETECTED NOT DETECTED Final    Comment: Performed at [facility], [place] [address] [place], [place] [address]  SARS CORONAVIRUS 2 (TAT 6-24 HRS) Nasopharyngeal Nasopharyngeal Swab     Status: None   Collection Time: 02/01/20 10:26 AM   Specimen: Nasopharyngeal Swab  Result Value Ref Range Status   SARS Coronavirus 2 NEGATIVE NEGATIVE Final    Comment: (NOTE) SARS-CoV-2 target nucleic acids are NOT DETECTED. The SARS-CoV-2 RNA is generally detectable in upper and lower respiratory specimens during the acute phase of  infection. Negative results do not preclude SARS-CoV-2 infection, do not rule out co-infections with other pathogens, and should not be used as the sole basis for treatment or other patient management decisions. Negative results must be combined with clinical observations, patient history, and epidemiological information. The expected result is Negative. Fact Sheet for Patients: SugarRoll.be Fact Sheet for Healthcare Providers: https://www.woods-mathews.com/ This test is not yet approved or cleared by the [place] FDA and  has been authorized for detection and/or diagnosis of SARS-CoV-2 by FDA under an Emergency Use Authorization (EUA). This EUA will remain  in effect (meaning this test can be used) for the duration of the COVID-19 declaration under Section 56 4(b)(1) of the Act, 21 U.S.C. section 360bbb-3(b)(1), unless the authorization is terminated or revoked sooner. Performed at [facility], [place] [address], [place], [place] [address]   Urine culture     Status: Abnormal   Collection Time: 02/01/20  3:51 PM   Specimen: In/Out Cath Urine  Result Value Ref Range Status   Specimen Description IN/OUT CATH URINE  Final   Special Requests   Final    NONE Performed at [facility], [place] [address] [place],  [address]    Culture 50,000 COLONIES/mL STAPHYLOCOCCUS AUREUS (A)  Final   Report Status 02/03/2020 FINAL  Final   Organism ID, Bacteria STAPHYLOCOCCUS AUREUS (A)  Final      Susceptibility   Staphylococcus aureus - MIC*    CIPROFLOXACIN <=0.5 SENSITIVE Sensitive  GENTAMICIN <=0.5 SENSITIVE Sensitive     NITROFURANTOIN <=16 SENSITIVE Sensitive     OXACILLIN <=0.25 SENSITIVE Sensitive     TETRACYCLINE <=1 SENSITIVE Sensitive     VANCOMYCIN 1 SENSITIVE Sensitive     TRIMETH/SULFA <=10 SENSITIVE Sensitive     CLINDAMYCIN <=0.25 SENSITIVE Sensitive     RIFAMPIN <=0.5 SENSITIVE Sensitive     Inducible  Clindamycin NEGATIVE Sensitive     * 50,000 COLONIES/mL STAPHYLOCOCCUS AUREUS  MRSA PCR Screening     Status: None   Collection Time: 02/02/20  9:37 AM   Specimen: Nasal Mucosa; Nasopharyngeal  Result Value Ref Range Status   MRSA by PCR NEGATIVE NEGATIVE Final    Comment:        The GeneXpert MRSA Assay (FDA approved for NASAL specimens only), is one component of a comprehensive MRSA colonization surveillance program. It is not intended to diagnose MRSA infection nor to guide or monitor treatment for MRSA infections. Performed at [facility], [place] [address], [place], [place] [address]   Surgical pcr screen     Status: Abnormal   Collection Time: 02/02/20 10:41 PM   Specimen: Nasal Mucosa; Nasal Swab  Result Value Ref Range Status   MRSA, PCR NEGATIVE NEGATIVE Final   Staphylococcus aureus POSITIVE (A) NEGATIVE Final    Comment: (NOTE) The Xpert SA Assay (FDA approved for NASAL specimens in patients 21 years of age and older), is one component of a comprehensive surveillance program. It is not intended to diagnose infection nor to guide or monitor treatment. Performed at [facility], [place] [address], [place], [place] [address]   Culture, blood (routine x 2)     Status: None (Preliminary result)   Collection Time: 02/03/20  2:24 PM   Specimen: BLOOD  Result Value Ref Range Status   Specimen Description BLOOD LEFT ARM  Final   Special Requests   Final    BOTTLES DRAWN AEROBIC AND ANAEROBIC Blood Culture adequate volume   Culture   Final    NO GROWTH 3 DAYS Performed at [facility], [address] [place], [place] [address]    Report Status PENDING  Incomplete  Culture, blood (routine x 2)     Status: None (Preliminary result)   Collection Time: 02/03/20  2:26 PM   Specimen: BLOOD  Result Value Ref Range Status   Specimen Description BLOOD LEFT HAND  Final   Special Requests   Final    BOTTLES DRAWN AEROBIC AND ANAEROBIC Blood Culture adequate  volume   Culture   Final    NO GROWTH 3 DAYS Performed at [facility], [place] [address] [place],  [address]    Report Status PENDING  Incomplete      Radiology Studies: ECHO TEE  Result Date: 02/05/2020    TRANSESOPHOGEAL ECHO REPORT   Patient Name:   [facility] Klemp Date of Exam: 02/05/2020 Medical Rec #:  [id number]     Height:       68.0 in Accession #:    [id number]    Weight:       203.5 lb Date of Birth:  Aug 18, 1961    BSA:          2.059 m Patient Age:    89 years      BP:           92/47 mmHg Patient Gender: M             HR:  81 bpm. Exam Location:  Inpatient Procedure: 2D Echo, Color Doppler and Cardiac Doppler Indications:     bacteremia 790.7  History:         Patient has prior history of Echocardiogram examinations, most                  recent 02/02/2020.  Sonographer:     Philipp Deputy Referring Phys:  [id number] WEAVER Diagnosing Phys: Cherlynn Kaiser MD PROCEDURE: After discussion of the risks and benefits of a TEE, an informed consent was obtained from the patient. The transesophogeal probe was passed without difficulty through the esophogus of the patient. Local oropharyngeal anesthetic was provided with Cetacaine. Sedation performed by different physician. The patient was monitored while under deep sedation. Image quality was adequate. The patient's vital signs; including heart rate, blood pressure, and oxygen saturation; remained stable throughout  the procedure. The patient developed no complications during the procedure. IMPRESSIONS  1. Left ventricular ejection fraction, by estimation, is 60 to 65%. The left ventricle has normal function. The left ventricle has no regional wall motion abnormalities.  2. Right ventricular systolic function is normal. The right ventricular size is normal.  3. Left atrial size was mildly dilated. No left atrial/left atrial appendage thrombus was detected.  4. 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  Mild mitral valve regurgitation.  5. The aortic valve is normal in structure. Aortic valve regurgitation is not visualized.  6. There is mild (Grade II) atheroma plaque involving the transverse and descending aorta.  7. Evidence of atrial level shunting detected by color flow Doppler. Agitated saline contrast bubble study was positive with shunting observed within 3-6 cardiac cycles suggestive of interatrial shunt. There is a tiny patent foramen ovale with predominantly right to left shunting across the atrial septum. Conclusion(s)/Recommendation(s): No evidence of vegetation/infective endocarditis on this transesophageal echocardiogram. FINDINGS  Left Ventricle: Left ventricular ejection fraction, by estimation, is 60 to 65%. The left ventricle has normal function. The left ventricle has no regional wall motion abnormalities. The left ventricular internal cavity size was normal in size. There is  no left ventricular hypertrophy. Right Ventricle: The right ventricular size is normal. No increase in right ventricular wall thickness. Right ventricular systolic function is normal. Left Atrium: Left atrial size was mildly dilated. No left atrial/left atrial appendage thrombus was detected. Right Atrium: Right atrial size was normal in size. Pericardium: Trivial pericardial effusion is present. 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 Normal mobility of the mitral valve leaflets. Mild mitral valve regurgitation. Tricuspid Valve: The tricuspid valve is normal in structure. Tricuspid valve regurgitation is trivial. No evidence of tricuspid stenosis. Aortic Valve: The aortic valve is normal in structure. Aortic valve regurgitation is not visualized. Pulmonic Valve: The pulmonic valve was normal in structure. Pulmonic valve regurgitation is trivial. Aorta: The aortic root and ascending aorta are structurally normal, with no evidence of dilitation. There is mild (Grade II) atheroma plaque involving the transverse and  descending aorta. IAS/Shunts: The interatrial septum appears to be lipomatous. Evidence of atrial level shunting detected by color flow Doppler. Agitated saline contrast was given intravenously to evaluate for intracardiac shunting. Agitated saline contrast bubble study was positive with shunting observed within 3-6 cardiac cycles suggestive of interatrial shunt. A tiny patent foramen ovale is detected with predominantly right to left shunting across the atrial septum.   AORTA Ao Asc diam: 3.10 cm Cherlynn Kaiser MD Electronically signed by Cherlynn Kaiser MD Signature  Date/Time: 02/05/2020/8:13:15 PM    Final    [place] EKG SITE RITE  Result Date: 02/06/2020 If Site Rite image not attached, placement could not be confirmed due to current cardiac rhythm.    LOS: 6 days   Time spent: More than 50% of that time was spent in counseling and/or coordination of care.  Antonieta Pert, MD [facility]  02/07/2020, 8:20 AM

## 2020-02-07 NOTE — TOC Progression Note (Signed)
Transition of Care [facility]) - Progression Note    Patient Details  Name: Chris Bowman MRN: [id number] Date of Birth: 04-Feb-1961  [facility] of [facility] CM/SW Contact  Okey Dupre Lazaro Arms, LCSW Phone Number: 02/07/2020, 3:35 PM  Clinical Narrative:  On 3/9 Talked with [facility] supervisor Macario Golds regarding patient and his need for ST rehab and having no insurance and pending [place] application. CSW advised regarding facilities to contact and make patient and his sister aware that [place]. E-mail sent to financial counselor on 3/8 by nurse case manager Wendi regarding confirming Medicaid pending status.   3/9: Received response from Christia Reading, financial counselor that DSS was contacted and patient does not have a pending Medicaid application. Per Shanda Bumps, she has been trying to reach patient to complete a screening, but has not rec'd an answer when calling the room phone. Per Shanda Bumps, she will continue trying to reach patient.  3/10 - 1:29 pm: Talked with patient at the bedside and informed him that he does not have a pending Medicaid application. Also informed patient of the financial counselor's attempts to reach him and urged patient to answer the phone. Advised [name] that the financial counselor will be given his cell phone number to try. Patient expressed understanding and indicated that he will answer either phone.  CSW will continue to follow and can continue attempts to find LOG placement once he has a pending Medicaid and disability application.     Expected Discharge Plan: Skilled Nursing Facility Barriers to Discharge: Continued Medical Work up  Expected Discharge Plan and Services Expected Discharge Plan: Skilled Nursing Facility In-house Referral: Clinical Social Work     Living arrangements for the past 2 months: Apartment                                       Social Determinants of Health (SDOH) Interventions     Readmission Risk Interventions No flowsheet data found.

## 2020-02-07 NOTE — Progress Notes (Signed)
Consulted with Mr. Kavian on Advanced Directive paperwork. Will continue to be available for spiritual care as needed.  Rev. Margaretann Loveless Chaplain M. Div.

## 2020-02-07 NOTE — Progress Notes (Signed)
POD 5 a/p rAY AMPUTATION  Vac APPARENTLY FELL LAST NIGHT HAS BEEN WORKING ON AND OFF.   Alert comfortable . No new drainage in vac, Will remove tomorrow if not working consistantly

## 2020-02-07 NOTE — Progress Notes (Signed)
Peripherally Inserted Central Catheter Placement  The IV Nurse has discussed with the patient and/or persons authorized to consent for the patient, the purpose of this procedure and the potential benefits and risks involved with this procedure.  The benefits include less needle sticks, lab draws from the catheter, and the patient may be discharged home with the catheter. Risks include, but not limited to, infection, bleeding, blood clot (thrombus formation), and puncture of an artery; nerve damage and irregular heartbeat and possibility to perform a PICC exchange if needed/ordered by physician.  Alternatives to this procedure were also discussed.  Bard Power PICC patient education guide, fact sheet on infection prevention and patient information card has been provided to patient /or left at bedside.    PICC/Midline Placement Documentation  PICC Single Lumen 02/07/20 PICC Right Basilic 39 cm 0 cm (Active)  Indication for Insertion or Continuance of Line Prolonged intravenous therapies;Home intravenous therapies (PICC only) 02/07/20 1615  Exposed Catheter (cm) 0 cm 02/07/20 1615  Site Assessment Clean;Dry;Intact 02/07/20 1615  Line Status Flushed;Saline locked;Blood return noted 02/07/20 1615  Dressing Type Transparent;Securing device 02/07/20 1615  Dressing Status Clean;Dry;Intact;Antimicrobial disc in place 02/07/20 1615  Dressing Intervention New dressing 02/07/20 1615  Dressing Change Due 02/14/20 02/07/20 1615       Annett Fabian 02/07/2020, 4:17 PM

## 2020-02-08 LAB — CULTURE, BLOOD (ROUTINE X 2)
Culture: NO GROWTH
Culture: NO GROWTH
Special Requests: ADEQUATE
Special Requests: ADEQUATE

## 2020-02-08 LAB — CBC WITH DIFFERENTIAL/PLATELET
Abs Immature Granulocytes: 0.12 10*3/uL — ABNORMAL HIGH (ref 0.00–0.07)
Basophils Absolute: 0.1 10*3/uL (ref 0.0–0.1)
Basophils Relative: 1 %
Eosinophils Absolute: 0.3 10*3/uL (ref 0.0–0.5)
Eosinophils Relative: 3 %
HCT: 28.1 % — ABNORMAL LOW (ref 39.0–52.0)
Hemoglobin: 8.8 g/dL — ABNORMAL LOW (ref 13.0–17.0)
Immature Granulocytes: 1 %
Lymphocytes Relative: 12 %
Lymphs Abs: 1.3 10*3/uL (ref 0.7–4.0)
MCH: 22.3 pg — ABNORMAL LOW (ref 26.0–34.0)
MCHC: 31.3 g/dL (ref 30.0–36.0)
MCV: 71.1 fL — ABNORMAL LOW (ref 80.0–100.0)
Monocytes Absolute: 0.8 10*3/uL (ref 0.1–1.0)
Monocytes Relative: 8 %
Neutro Abs: 8.1 10*3/uL — ABNORMAL HIGH (ref 1.7–7.7)
Neutrophils Relative %: 75 %
Platelets: 457 10*3/uL — ABNORMAL HIGH (ref 150–400)
RBC: 3.95 MIL/uL — ABNORMAL LOW (ref 4.22–5.81)
RDW: 15 % (ref 11.5–15.5)
WBC: 10.7 10*3/uL — ABNORMAL HIGH (ref 4.0–10.5)
nRBC: 0 % (ref 0.0–0.2)

## 2020-02-08 LAB — COMPREHENSIVE METABOLIC PANEL
ALT: 102 U/L — ABNORMAL HIGH (ref 0–44)
AST: 171 U/L — ABNORMAL HIGH (ref 15–41)
Albumin: 1.7 g/dL — ABNORMAL LOW (ref 3.5–5.0)
Alkaline Phosphatase: 256 U/L — ABNORMAL HIGH (ref 38–126)
Anion gap: 12 (ref 5–15)
BUN: 27 mg/dL — ABNORMAL HIGH (ref 6–20)
CO2: 24 mmol/L (ref 22–32)
Calcium: 8.1 mg/dL — ABNORMAL LOW (ref 8.9–10.3)
Chloride: 93 mmol/L — ABNORMAL LOW (ref 98–111)
Creatinine, Ser: 1.36 mg/dL — ABNORMAL HIGH (ref 0.61–1.24)
GFR calc Af Amer: >60 mL/min (ref >60)
GFR calc non Af Amer: 57 mL/min — ABNORMAL LOW (ref >60)
Glucose, Bld: 188 mg/dL — ABNORMAL HIGH (ref 70–99)
Potassium: 4.5 mmol/L (ref 3.5–5.1)
Sodium: 129 mmol/L — ABNORMAL LOW (ref 135–145)
Total Bilirubin: 0.3 mg/dL (ref 0.3–1.2)
Total Protein: 6.2 g/dL — ABNORMAL LOW (ref 6.5–8.1)

## 2020-02-08 LAB — GLUCOSE, CAPILLARY
Glucose-Capillary: 187 mg/dL — ABNORMAL HIGH (ref 70–99)
Glucose-Capillary: 164 mg/dL — ABNORMAL HIGH (ref 70–99)
Glucose-Capillary: 266 mg/dL — ABNORMAL HIGH (ref 70–99)
Glucose-Capillary: 168 mg/dL — ABNORMAL HIGH (ref 70–99)

## 2020-02-08 MED ORDER — PRO-STAT SUGAR FREE PO LIQD
30.0000 mL | Freq: Two times a day (BID) | ORAL | Status: DC
  Administered 2020-02-08 – 2020-03-01 (×45): 30 mL via ORAL
  Filled 2020-02-08 (×44): qty 30

## 2020-02-08 NOTE — Progress Notes (Signed)
Nutrition Follow-up  DOCUMENTATION CODES:   Not applicable  INTERVENTION:   No BM since 3/04; recommend further interventions regarding bowel regimen  Glucerna Shake po TID, each supplement provides 220 kcal and 10 grams of protein  30 ml Prostat BID, each supplement provides 100 kcals and 15 grams protein.   Continue MVI with Minerals  NUTRITION DIAGNOSIS:   Increased nutrient needs related to wound healing as evidenced by estimated needs.  Being addressed via supplements, MVI  GOAL:   Patient will meet greater than or equal to 90% of their needs  Progressing  MONITOR:   PO intake, Supplement acceptance, Weight trends, Labs, I & O's, Skin  REASON FOR ASSESSMENT:   Consult Assessment of nutrition requirement/status  ASSESSMENT:   Patient with PMH significant for delusional disorder, essential HTN, and uncontrolled DM. Presents this admission with sepsis likely 2/2 to bilateral foot ulcers.   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 Pt will need Carb Modified diet at discharge to SNF Pt has been working with PT  Recorded po intake 50-100% (83% on average). Pt also drinking Glucerna Shakes  No weight since 3/07, weight of 92.3 kg at that time. Admit weight of 83 kg.   No BM since 3/4; noted pt on senna-docusate BID and some prn meds.   Labs: sodium 129 (L), Creatinine 1.36, BUN 27, CBGs 111-249 Meds: ss novolog, novolog 70/30 BID, LR at 100 ml/hr, MVI with Minerals, B-12   Diet Order:   Diet Order            Diet Carb Modified Fluid consistency: Thin; Room service appropriate? Yes  Diet effective now              EDUCATION NEEDS:   Not appropriate for education at this time  Skin:  Skin Assessment: Skin Integrity Issues: Skin Integrity Issues:: Other (Comment), Wound VAC Wound Vac: transmetatarsal amputation on right foot on 3/06 Diabetic Ulcer: bilateral  feet Other: I&D of L. great toe on 3/06  Last BM:  3/4  Height:   Ht Readings from Last 1 Encounters:  02/01/20 5\' 8"  (1.727 m)    Weight:   Wt Readings from Last 1 Encounters:  02/04/20 92.3 kg    Ideal Body Weight:  70 kg  BMI:  Body mass index is 30.94 kg/m.  Estimated Nutritional Needs:   Kcal:  2100-2300 kcal  Protein:  105-120 grams  Fluid:  >/= 2.1 L/day   04/05/20 MS, RDN, LDN, CNSC RD Pager Number and Weekend/On-Call After Hours Pager Located in [place]

## 2020-02-08 NOTE — Progress Notes (Signed)
Patient awake and comfortable  Vac not functioning and clotted. Removed. Dry dressing applied. Will  need follow up Dr. Lajoyce Corners 1 week after discharge. Daily dry dressing change

## 2020-02-08 NOTE — Progress Notes (Signed)
PROGRESS NOTE    Chris Bowman  RFF:[id number] DOB: [birth year] DOA: 02/01/2020 PCP: Patient, No Pcp Per   Brief Narrative: 59 year old male with UNknown medical history, who has not seen a doctor since thousand 18 presented to the ER on 3/4 with weakness and falls x2 weeks and associated fever dyspnea cough, chronic buttock pain since the fall along with limitation of range of motion, right arm pain.  Apparently diagnosed with diabetes in thousand and but did not think he needed medicine for it, reported history of taking too many baby aspirin and having stomach pain" I have a little incision is scar from that" in the ED afebrile Covid negative WBC 20 4K glucose 300 A1c 11 sodium 120, patient was admitted under hospitalist.  Work-up showed MSSA UTI, bacteremia bilateral foot osteomyelitis with associated sepsis and concern for cervical spine epidural abscess/phlegmon C3-C5 no cord compression, no evidence of discitis or osteomyelitis.  Patient was seen by neurosurgery, infectious disease.  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  Patient is on Ancef as per ID and will need 6 weeks of IV antibiotic, repeat blood culture on 3/6 and PICC line 3/10.On 3/8 urine retention needing in and out cath and subsequently needing Foley catheter.  Subjective:  Patient had fever of 100.5 yesterday mostly after the procedure PICC line placement. Patient reports he is less foggy today.  He is feeling improved.  He is working with physical therapy trying to get to the bedside chair. No recurrence of fever.   Assessment & Plan:  MSSA sepsis due to bilateral foot osteomyelitis/MSSA UTI/cervical C3-C5 osteomyelitis/discitis with epidural phlegmon: Patient with sepsis on presentation.  Etiology as above.  WBC nicely improving.  She is afebrile.  Seen by  multiple consultants including ID, orthopedics.  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 Neurosurgery recommends 6 weeks to 3 months and repeat MRI C-spine with and without contrast in 6 weeks or sooner if progressive myelopathy develops if develops worse back pain will need MRI lumbar spine with and without contrast.Back pain is resolved.Continue Ancef x6 weeks as per Dr. Graylon Good. apprecaite  ID orthopedic and neurosurgery input. Follow-up with ID clinic in 4 weeks.  Scalene Myositis: Prevertebral edema with right-sided paravertebral fluid collections in the mid to lower cervical spine concerning for infection and abscesses with suspected myositis involving the right-sided scalene musculature noted on MRI. Case discussed with ENT Dr. Wilburn Cornelia who recommend antibiotic treatment,no surgical intervention needed  Urine retention continue supportive care.  Placed on indwelling Foley- DAY #3 foley placed 3/8 when he had 700 ml urine on bladder scan. Voiding trial once more ambulatory.  AKI secondary to ATN in the setting of sepsis.  No obstruction in the ultrasound.  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  Monitor renal function closely. Creat at 1.3  Type 2 diabetes mellitus with hyperglycemia, without long-term current use of insulin: hba1c uncontrolled at 11.3.  Started on Levemir and changed to insulin 70/30, continue diabetic coordinator follow-up and adjust insulin.  Sugar fairly controlled.   Recent Labs  Lab 02/07/20 0727 02/07/20 1120 02/07/20 1648 02/07/20 2037 02/08/20 0716  GLUCAP  176* 249* 237* 111* 187*   Ulcer of left foot due to type 2 diabetes mellitus cont wound care  Hyponatremia: Likely from hypovolemia. Sodium improving 129.  Monitor  Generalized weakness/frequent fall : Continue PT OT and will need a skilled nursing facility placement.  SW working on it.  Discussed with Education officer, museum.  Seen by neurology sent neuropathy,  B1 B6 level, which are WNL, MMA slightly up at 403,.  Transaminitis with ALP elevation. AST/ALT also elevated.171/102  On 3/11 TB nl.  Hepatitis panel negative.  Likely from sepsis.  Ultrasound shows cholelithiasis but no obstruction. Monitor.  Microcytic anemia B12 normal folate normal TSH normal iron panel, with elevated ferritin 543, iron low at 12, TIBC low at 174.  Delusional disorder/impaired memory MRI brain showed mild cerebral atrophy and chronic small vessel ischemia.  Currently patient is alert awake oriented, follows commands.  Has some tangential thought process.  HIV screen negative.  Nutrition: Nutrition Problem: Increased nutrient needs Etiology: wound healing Signs/Symptoms: estimated needs Interventions: Glucerna shake, MVI Body mass index is 30.94 kg/m.   DVT prophylaxis:SCD Code Status:full Family Communication: plan of care discussed with patient at bedside. Disposition Plan: Patient is from: Home Anticipated Disposition: to skilled nursing facility Barriers to discharge or conditions that needs to be met prior to discharge: Patient admitted with MSSA sepsis foot osteomyelitis, cervical osteo/discitis, remains hospitalized for IV antibiotics.  Had fever yesterday likely post procedure.  Monitor next 24 hours.  If afebrile next 24 hrs  will plan for skilled nursing facility disposition for IV antibiotics x6 weeks.  Consultants: ID, neurology, cardiology for TEE, neurosurgery, orthopedics  Procedures:see note Microbiology: Urine culture MSSA, blood culture MSSA, repeat BLOOD CX 3/6 - so far   Medications: Scheduled Meds: . Chlorhexidine Gluconate Cloth  6 each Topical Daily  . feeding supplement (GLUCERNA SHAKE)  237 mL Oral TID BM  . insulin aspart  0-15 Units  Subcutaneous TID WC  . insulin aspart  0-5 Units Subcutaneous QHS  . insulin aspart protamine- aspart  8 Units Subcutaneous BID WC  . insulin starter kit- pen needles  1 kit Other Once  . multivitamin with minerals  1 tablet Oral Daily  . senna-docusate  1 tablet Oral BID  . sodium chloride flush  3 mL Intravenous Q12H  . tamsulosin  0.4 mg Oral Daily  . vitamin B-12  1,000 mcg Oral Daily   Continuous Infusions: . sodium chloride 10 mL/hr at 02/05/20 1521  .  ceFAZolin (ANCEF) IV 2 g (02/08/20 0528)  . lactated ringers Stopped (02/03/20 1146)  . methocarbamol (ROBAXIN) IV      Antimicrobials: Anti-infectives (From admission, onward)   Start     Dose/Rate Route Frequency Ordered Stop   02/03/20 1130  ceFAZolin (ANCEF) IVPB 1 g/50 mL premix  Status:  Discontinued     1 g 100 mL/hr over 30 Minutes Intravenous Every 6 hours 02/03/20 1124 02/03/20 1139   02/02/20 1100  vancomycin (VANCOREADY) IVPB 750 mg/150 mL  Status:  Discontinued     750 mg 150 mL/hr over 60 Minutes Intravenous Every 24 hours 02/01/20 1218 02/02/20 0221   02/02/20 0600  ceFAZolin (ANCEF) IVPB 2g/100 mL premix     2 g 200 mL/hr over 30 Minutes Intravenous Every 8 hours 02/02/20 0221     02/01/20 2230  ceFEPIme (MAXIPIME) 2 g in sodium chloride 0.9 % 100 mL IVPB  Status:  Discontinued     2 g 200 mL/hr over 30 Minutes Intravenous Every 12  hours 02/01/20 1218 02/02/20 0221   02/01/20 1200  metroNIDAZOLE (FLAGYL) IVPB 500 mg  Status:  Discontinued     500 mg 100 mL/hr over 60 Minutes Intravenous Every 8 hours 02/01/20 1149 02/02/20 0221   02/01/20 1015  ceFEPIme (MAXIPIME) 2 g in sodium chloride 0.9 % 100 mL IVPB     2 g 200 mL/hr over 30 Minutes Intravenous  Once 02/01/20 1008 02/01/20 1056   02/01/20 1015  vancomycin (VANCOREADY) IVPB 1750 mg/350 mL     1,750 mg 175 mL/hr over 120 Minutes Intravenous  Once 02/01/20 1008 02/01/20 1336     Objective: Vitals: Today's Vitals   02/07/20 1913 02/07/20 2026  02/07/20 2038 02/08/20 0442  BP:   127/70 121/74  Pulse:   84 88  Resp:   18 18  Temp: 98.6 F (37 C)  99.1 F (37.3 C) 100 F (37.8 C)  TempSrc: Oral  Oral Oral  SpO2:   95% 94%  Weight:      Height:      PainSc:  0-No pain      Intake/Output Summary (Last 24 hours) at 02/08/2020 0809 Last data filed at 02/08/2020 0700 Gross per 24 hour  Intake 1127.74 ml  Output 1300 ml  Net -172.26 ml   Filed Weights   02/01/20 0801 02/01/20 2300 02/04/20 2107  Weight: 68 kg 83 kg 92.3 kg   Weight change:    Intake/Output from previous day: 03/10 0701 - 03/11 0700 In: 1127.7 [P.O.:680; I.V.:90.9; IV Piggyback:356.8] Out: 1300 [Urine:1300] Intake/Output this shift: No intake/output data recorded.  Examination:  General exam: AAOx3 ,NAD, weak appearing. HEENT:Oral mucosa moist, Ear/Nose WNL grossly,dentition normal. Respiratory system: bilaterally clear,no wheezing or crackles,no use of accessory muscle, non tender. Cardiovascular system: S1 & S2 +, regular, No JVD. Gastrointestinal system: Abdomen soft, NT,ND, BS+. Nervous System:Alert, awake, moving extremities and grossly nonfocal Extremities: No edema, distal peripheral pulses palpable.  Skin: No rashes,no icterus. MSK: Normal muscle bulk,tone, power Rt mid foot amputation with wound vac.   Data Reviewed: I have personally reviewed following labs and imaging studies CBC: Recent Labs  Lab 02/04/20 0410 02/05/20 0500 02/06/20 0421 02/07/20 0347 02/08/20 0430  WBC 14.7* 12.8* 12.6* 11.6* 10.7*  NEUTROABS 11.2* 9.8* 9.6* 8.6* 8.1*  HGB 9.3* 9.9* 9.1* 9.3* 8.8*  HCT 29.1* 31.3* 27.8* 29.0* 28.1*  MCV 71.0* 71.6* 69.3* 69.9* 71.1*  PLT 375 404* 411* 440* 952*   Basic Metabolic Panel: Recent Labs  Lab 02/03/20 0323 02/03/20 0323 02/04/20 0410 02/05/20 0500 02/06/20 0421 02/07/20 0347 02/08/20 0430  NA 124*   < > 126* 125* 127* 127* 129*  K 4.1   < > 4.5 4.7 5.0 4.7 4.5  CL 93*   < > 92* 92* 93* 91* 93*  CO2 21*    < > _0 GLUCOSE 225*   < > 253* 264* 154* 178* 188*  BUN 31*   < > 24* 24* 24* 26* 27*  CREATININE 1.57*   < > 1.53* 1.54* 1.37* 1.35* 1.36*  CALCIUM 7.8*   < > 8.1* 8.2* 8.1* 8.3* 8.1*  MG 1.5*  --  2.0 2.0  --   --   --    < > = values in this interval not displayed.   GFR: Estimated Creatinine Clearance: 66.1 mL/min (A) (by C-G formula based on SCr of 1.36 mg/dL (H)). Liver Function Tests: Recent Labs  Lab 02/04/20 0410 02/05/20 0500 02/06/20 0421 02/07/20 0347 02/08/20 0430  AST 49* 92* 145* 212* 171*  ALT 27 41 62* 100* 102*  ALKPHOS 239* 248* 229* 241* 256*  BILITOT 0.6 0.2* 0.2* 0.4 0.3  PROT 6.4* 6.8 6.3* 6.3* 6.2*  ALBUMIN 1.8* 1.7* 1.6* 1.6* 1.7*   Recent Labs  Lab 02/03/20 0323  LIPASE 30   Recent Labs  Lab 02/02/20 1131  AMMONIA 18   Coagulation Profile: Recent Labs  Lab 02/01/20 0930  INR 1.1   Cardiac Enzymes: Recent Labs  Lab 02/01/20 1551 02/03/20 0323  CKTOTAL 179 62   BNP (last 3 results) No results for input(s): PROBNP in the last 8760 hours. HbA1C: No results for input(s): HGBA1C in the last 72 hours. CBG: Recent Labs  Lab 02/07/20 0727 02/07/20 1120 02/07/20 1648 02/07/20 2037 02/08/20 0716  GLUCAP 176* 249* 237* 111* 187*   Lipid Profile: No results for input(s): CHOL, HDL, LDLCALC, TRIG, CHOLHDL, LDLDIRECT in the last 72 hours. Thyroid Function Tests: No results for input(s): TSH, T4TOTAL, FREET4, T3FREE, THYROIDAB in the last 72 hours. Anemia Panel: No results for input(s): VITAMINB12, FOLATE, FERRITIN, TIBC, IRON, RETICCTPCT in the last 72 hours. Sepsis Labs: Recent Labs  Lab 02/01/20 0950 02/01/20 1551 02/03/20 0323  PROCALCITON  --  2.66  --   LATICACIDVEN 1.8  --  0.8    Recent Results (from the past 240 hour(s))  Culture, blood (routine x 2)     Status: Abnormal   Collection Time: 02/01/20  9:20 AM   Specimen: BLOOD LEFT ARM  Result Value Ref Range Status   Specimen Description BLOOD LEFT ARM   Final   Special Requests   Final    BOTTLES DRAWN AEROBIC AND ANAEROBIC Blood Culture adequate volume   Culture  Setup Time   Final    IN BOTH AEROBIC AND ANAEROBIC BOTTLES GRAM POSITIVE COCCI IN CLUSTERS CRITICAL RESULT CALLED TO, READ BACK BY AND VERIFIED WITH: Diona Browner PHARMD 02/01/20 2244 JDW Performed at [facility], [place] [address] [place], [place] [address]    Culture STAPHYLOCOCCUS AUREUS (A)  Final   Report Status 02/04/2020 FINAL  Final   Organism ID, Bacteria STAPHYLOCOCCUS AUREUS  Final      Susceptibility   Staphylococcus aureus - MIC*    CIPROFLOXACIN <=0.5 SENSITIVE Sensitive     ERYTHROMYCIN <=0.25 SENSITIVE Sensitive     GENTAMICIN <=0.5 SENSITIVE Sensitive     OXACILLIN <=0.25 SENSITIVE Sensitive     TETRACYCLINE <=1 SENSITIVE Sensitive     VANCOMYCIN 1 SENSITIVE Sensitive     TRIMETH/SULFA <=10 SENSITIVE Sensitive     CLINDAMYCIN <=0.25 SENSITIVE Sensitive     RIFAMPIN <=0.5 SENSITIVE Sensitive     Inducible Clindamycin NEGATIVE Sensitive     * STAPHYLOCOCCUS AUREUS  Culture, blood (routine x 2)     Status: Abnormal   Collection Time: 02/01/20  9:30 AM   Specimen: BLOOD LEFT HAND  Result Value Ref Range Status   Specimen Description BLOOD LEFT HAND  Final   Special Requests   Final    BOTTLES DRAWN AEROBIC ONLY Blood Culture results may not be optimal due to an inadequate volume of blood received in culture bottles   Culture  Setup Time   Final    AEROBIC BOTTLE ONLY GRAM POSITIVE COCCI IN CLUSTERS Organism ID to follow CRITICAL RESULT CALLED TO, READ BACK BY AND VERIFIED WITH: V BRYK PHARMD 02/02/20 0213 JDW    Culture (A)  Final    STAPHYLOCOCCUS AUREUS SUSCEPTIBILITIES PERFORMED ON PREVIOUS CULTURE WITHIN  THE LAST 5 DAYS. Performed at [facility], [place] [address], [place], [place] [address]    Report Status 02/04/2020 FINAL  Final  Blood Culture ID Panel (Reflexed)     Status: Abnormal   Collection Time: 02/01/20  9:30 AM  Result Value Ref  Range Status   Enterococcus species NOT DETECTED NOT DETECTED Final   Listeria monocytogenes NOT DETECTED NOT DETECTED Final   Staphylococcus species DETECTED (A) NOT DETECTED Final    Comment: CRITICAL RESULT CALLED TO, READ BACK BY AND VERIFIED WITH: V BRYK PHARMD 02/02/20 0213 JDW    Staphylococcus aureus (BCID) DETECTED (A) NOT DETECTED Final    Comment: Methicillin (oxacillin) susceptible Staphylococcus aureus (MSSA). Preferred therapy is anti staphylococcal beta lactam antibiotic (Cefazolin or Nafcillin), unless clinically contraindicated. CRITICAL RESULT CALLED TO, READ BACK BY AND VERIFIED WITH: V BRYK PHARMD 02/02/20 0213 JDW    Methicillin resistance NOT DETECTED NOT DETECTED Final   Streptococcus species NOT DETECTED NOT DETECTED Final   Streptococcus agalactiae NOT DETECTED NOT DETECTED Final   Streptococcus pneumoniae NOT DETECTED NOT DETECTED Final   Streptococcus pyogenes NOT DETECTED NOT DETECTED Final   Acinetobacter baumannii NOT DETECTED NOT DETECTED Final   Enterobacteriaceae species NOT DETECTED NOT DETECTED Final   Enterobacter cloacae complex NOT DETECTED NOT DETECTED Final   Escherichia coli NOT DETECTED NOT DETECTED Final   Klebsiella oxytoca NOT DETECTED NOT DETECTED Final   Klebsiella pneumoniae NOT DETECTED NOT DETECTED Final   Proteus species NOT DETECTED NOT DETECTED Final   Serratia marcescens NOT DETECTED NOT DETECTED Final   Haemophilus influenzae NOT DETECTED NOT DETECTED Final   Neisseria meningitidis NOT DETECTED NOT DETECTED Final   Pseudomonas aeruginosa NOT DETECTED NOT DETECTED Final   Candida albicans NOT DETECTED NOT DETECTED Final   Candida glabrata NOT DETECTED NOT DETECTED Final   Candida krusei NOT DETECTED NOT DETECTED Final   Candida parapsilosis NOT DETECTED NOT DETECTED Final   Candida tropicalis NOT DETECTED NOT DETECTED Final    Comment: Performed at [facility], [place] [address] [place], [place] [address]  SARS CORONAVIRUS 2  (TAT 6-24 HRS) Nasopharyngeal Nasopharyngeal Swab     Status: None   Collection Time: 02/01/20 10:26 AM   Specimen: Nasopharyngeal Swab  Result Value Ref Range Status   SARS Coronavirus 2 NEGATIVE NEGATIVE Final    Comment: (NOTE) SARS-CoV-2 target nucleic acids are NOT DETECTED. The SARS-CoV-2 RNA is generally detectable in upper and lower respiratory specimens during the acute phase of infection. Negative results do not preclude SARS-CoV-2 infection, do not rule out co-infections with other pathogens, and should not be used as the sole basis for treatment or other patient management decisions. Negative results must be combined with clinical observations, patient history, and epidemiological information. The expected result is Negative. Fact Sheet for Patients: SugarRoll.be Fact Sheet for Healthcare Providers: https://www.woods-mathews.com/ This test is not yet approved or cleared by the [place] FDA and  has been authorized for detection and/or diagnosis of SARS-CoV-2 by FDA under an Emergency Use Authorization (EUA). This EUA will remain  in effect (meaning this test can be used) for the duration of the COVID-19 declaration under Section 56 4(b)(1) of the Act, 21 U.S.C. section 360bbb-3(b)(1), unless the authorization is terminated or revoked sooner. Performed at [facility], [place] [address] [place],  [address]   Urine culture     Status: Abnormal   Collection Time: 02/01/20  3:51 PM   Specimen: In/Out Cath Urine  Result Value Ref Range Status   Specimen Description IN/OUT CATH URINE  Final   Special Requests   Final    NONE Performed at [facility], [place] [address], [place], [place] [address]    Culture 50,000 COLONIES/mL STAPHYLOCOCCUS AUREUS (A)  Final   Report Status 02/03/2020 FINAL  Final   Organism ID, Bacteria STAPHYLOCOCCUS AUREUS (A)  Final      Susceptibility   Staphylococcus aureus - MIC*     CIPROFLOXACIN <=0.5 SENSITIVE Sensitive     GENTAMICIN <=0.5 SENSITIVE Sensitive     NITROFURANTOIN <=16 SENSITIVE Sensitive     OXACILLIN <=0.25 SENSITIVE Sensitive     TETRACYCLINE <=1 SENSITIVE Sensitive     VANCOMYCIN 1 SENSITIVE Sensitive     TRIMETH/SULFA <=10 SENSITIVE Sensitive     CLINDAMYCIN <=0.25 SENSITIVE Sensitive     RIFAMPIN <=0.5 SENSITIVE Sensitive     Inducible Clindamycin NEGATIVE Sensitive     * 50,000 COLONIES/mL STAPHYLOCOCCUS AUREUS  MRSA PCR Screening     Status: None   Collection Time: 02/02/20  9:37 AM   Specimen: Nasal Mucosa; Nasopharyngeal  Result Value Ref Range Status   MRSA by PCR NEGATIVE NEGATIVE Final    Comment:        The GeneXpert MRSA Assay (FDA approved for NASAL specimens only), is one component of a comprehensive MRSA colonization surveillance program. It is not intended to diagnose MRSA infection nor to guide or monitor treatment for MRSA infections. Performed at [facility], [place] [address] [place], [place] [address]   Surgical pcr screen     Status: Abnormal   Collection Time: 02/02/20 10:41 PM   Specimen: Nasal Mucosa; Nasal Swab  Result Value Ref Range Status   MRSA, PCR NEGATIVE NEGATIVE Final   Staphylococcus aureus POSITIVE (A) NEGATIVE Final    Comment: (NOTE) The Xpert SA Assay (FDA approved for NASAL specimens in patients 35 years of age and older), is one component of a comprehensive surveillance program. It is not intended to diagnose infection nor to guide or monitor treatment. Performed at [facility], [place] [address] [place], [place] [address]   Culture, blood (routine x 2)     Status: None (Preliminary result)   Collection Time: 02/03/20  2:24 PM   Specimen: BLOOD  Result Value Ref Range Status   Specimen Description BLOOD LEFT ARM  Final   Special Requests   Final    BOTTLES DRAWN AEROBIC AND ANAEROBIC Blood Culture adequate volume   Culture   Final    NO GROWTH 4 DAYS Performed at [facility], [place] [address], [place], [place] [address]    Report Status PENDING  Incomplete  Culture, blood (routine x 2)     Status: None (Preliminary result)   Collection Time: 02/03/20  2:26 PM   Specimen: BLOOD  Result Value Ref Range Status   Specimen Description BLOOD LEFT HAND  Final   Special Requests   Final    BOTTLES DRAWN AEROBIC AND ANAEROBIC Blood Culture adequate volume   Culture   Final    NO GROWTH 4 DAYS Performed at [facility], [place] [address], [place], [place] [address]    Report Status PENDING  Incomplete      Radiology Studies: [place] EKG SITE RITE  Result Date: 02/06/2020 If Site Rite image not attached, placement could not be confirmed due to current cardiac rhythm.    LOS: 7 days   Time spent:  More than 50% of that time was spent in counseling and/or coordination of care.  Antonieta Pert, MD [facility]  02/08/2020, 8:09 AM

## 2020-02-08 NOTE — Plan of Care (Signed)
  Problem: Activity: Goal: Risk for activity intolerance will decrease Outcome: Progressing   

## 2020-02-08 NOTE — Progress Notes (Signed)
Helped and educated pt with CBG monitor and capillary stick. Pt was stated that he felt like it was too much information and tasks to do by himself. Pt reassured that with practice he would feel more comfortable. Sister stated earlier that she would be the one helping him draw up insulin.   Pt needs continued reinforcement.   Leonia Reeves, RN, BSN

## 2020-02-08 NOTE — Clinical Social Work Note (Signed)
On 3/10, Christia Reading, financial counselor spoke with patient regarding completing his Medicaid application and he requested that she speak with his sister Zella Ball as she is assisting him with this. Call was made to sister and Shanda Bumps is awaiting a call back. CSW will continue to follow.  Genelle Bal, MSW, LCSW Licensed Clinical Social Worker Clinical Social Work Department [employer] [phone]

## 2020-02-08 NOTE — Progress Notes (Signed)
Physical Therapy Treatment Patient Details Name: Chris Bowman MRN: [id number] DOB: 09-Oct-1961 Today's Date: 02/08/2020    History of Present Illness 59 y.o. male was admitted with bacteremia and received TEE to rule out endocarditis causing sepsis.  His R foot received transmet amp on 02/03/20 and L great toe wound I and D.  Has vac on R foot now, referred to PT for mobility.  PMHx:  uncontrolled diabetes, essential HTN, delusional disorders. bilateral feet wounds with early indication of osteomyelitis per MRI results from 3/4    PT Comments    Pt received in bed, agreeable to participation in therapy. R foot wound vac removed this AM. Pt required mod assist bed mobility and max assist sit to stand with RW. Pt sat EOB x 10 minutes min guard assist. Pt required continuous cues for sequencing. Pt becomes anxious quickly if he doesn't know what step comes next. Cues needed for relaxation. Unable to progress to recliner due to fatigue.  Pt assisted with return to supine in bed at end of session.   Follow Up Recommendations  SNF     Equipment Recommendations  None recommended by PT    Recommendations for Other Services       Precautions / Restrictions Precautions Precautions: Fall;Other (comment) Precaution Comments: picc line Required Braces or Orthoses: Other Brace Other Brace: R post-op shoe Restrictions RLE Weight Bearing: Non weight bearing LLE Weight Bearing: Weight bearing as tolerated    Mobility  Bed Mobility Overal bed mobility: Needs Assistance Bed Mobility: Rolling;Supine to Sit;Sit to Supine Rolling: Mod assist   Supine to sit: Mod assist;HOB elevated Sit to supine: Mod assist   General bed mobility comments: cues for sequencing, assist with BLE and trunk. Use of bed pad to scoot to EOB.  Transfers Overall transfer level: Needs assistance Equipment used: Rolling walker (2 wheeled) Transfers: Sit to/from Stand Sit to Stand: Max assist;From elevated surface          General transfer comment: cues for hand placement, max assist to power up and stabilized balance. Increased time to attain full stance. Unable to progress to recliner due to fatigue.  Ambulation/Gait                 Stairs             Wheelchair Mobility    Modified Rankin (Stroke Patients Only)       Balance Overall balance assessment: Needs assistance Sitting-balance support: Feet supported;Bilateral upper extremity supported Sitting balance-Leahy Scale: Fair Sitting balance - Comments: min guard assist sitting EOB     Standing balance-Leahy Scale: Poor Standing balance comment: heavy reliance on RW and external support                            Cognition Arousal/Alertness: Awake/alert Behavior During Therapy: Flat affect Overall Cognitive Status: No family/caregiver present to determine baseline cognitive functioning                                 General Comments: Requires continual cues for sequencing. Becomes anxious when unsure of the plan/what comes next.      Exercises      General Comments        Pertinent Vitals/Pain Pain Assessment: 0-10 Pain Score: 3  Pain Location: R foot Pain Descriptors / Indicators: Sore Pain Intervention(s): Monitored during session;Repositioned    Home Living  Prior Function            PT Goals (current goals can now be found in the care plan section) Acute Rehab PT Goals Patient Stated Goal: home Progress towards PT goals: Progressing toward goals    Frequency    Min 3X/week      PT Plan Current plan remains appropriate    Co-evaluation              AM-PAC PT "6 Clicks" Mobility   Outcome Measure  Help needed turning from your back to your side while in a flat bed without using bedrails?: A Little Help needed moving from lying on your back to sitting on the side of a flat bed without using bedrails?: A Lot Help needed  moving to and from a bed to a chair (including a wheelchair)?: A Lot Help needed standing up from a chair using your arms (e.g., wheelchair or bedside chair)?: A Lot Help needed to walk in hospital room?: A Lot Help needed climbing 3-5 steps with a railing? : Total 6 Click Score: 12    End of Session Equipment Utilized During Treatment: Gait belt Activity Tolerance: Patient limited by fatigue Patient left: in bed;with call bell/phone within reach;with bed alarm set Nurse Communication: Mobility status PT Visit Diagnosis: Unsteadiness on feet (R26.81);Muscle weakness (generalized) (M62.81);Difficulty in walking, not elsewhere classified (R26.2);Adult, failure to thrive (R62.7);Repeated falls (R29.6);Pain Pain - Right/Left: Right Pain - part of body: Ankle and joints of foot     Time: 0934-1001 PT Time Calculation (min) (ACUTE ONLY): 27 min  Charges:  $Therapeutic Activity: 23-37 mins                     Aida Raider, PT  Office # [phone] Pager [phone]    Ilda Foil 02/08/2020, 12:24 PM

## 2020-02-09 ENCOUNTER — Inpatient Hospital Stay (HOSPITAL_COMMUNITY): Payer: Non-veteran care

## 2020-02-09 LAB — CBC WITH DIFFERENTIAL/PLATELET
Abs Immature Granulocytes: 0.11 10*3/uL — ABNORMAL HIGH (ref 0.00–0.07)
Basophils Absolute: 0.1 10*3/uL (ref 0.0–0.1)
Basophils Relative: 1 %
Eosinophils Absolute: 0.4 10*3/uL (ref 0.0–0.5)
Eosinophils Relative: 4 %
HCT: 29.3 % — ABNORMAL LOW (ref 39.0–52.0)
Hemoglobin: 9.2 g/dL — ABNORMAL LOW (ref 13.0–17.0)
Immature Granulocytes: 1 %
Lymphocytes Relative: 19 %
Lymphs Abs: 1.8 10*3/uL (ref 0.7–4.0)
MCH: 22.5 pg — ABNORMAL LOW (ref 26.0–34.0)
MCHC: 31.4 g/dL (ref 30.0–36.0)
MCV: 71.8 fL — ABNORMAL LOW (ref 80.0–100.0)
Monocytes Absolute: 1 10*3/uL (ref 0.1–1.0)
Monocytes Relative: 10 %
Neutro Abs: 6.3 10*3/uL (ref 1.7–7.7)
Neutrophils Relative %: 65 %
Platelets: 469 10*3/uL — ABNORMAL HIGH (ref 150–400)
RBC: 4.08 MIL/uL — ABNORMAL LOW (ref 4.22–5.81)
RDW: 15.1 % (ref 11.5–15.5)
WBC: 9.7 10*3/uL (ref 4.0–10.5)
nRBC: 0 % (ref 0.0–0.2)

## 2020-02-09 LAB — COMPREHENSIVE METABOLIC PANEL
ALT: 78 U/L — ABNORMAL HIGH (ref 0–44)
AST: 102 U/L — ABNORMAL HIGH (ref 15–41)
Albumin: 1.8 g/dL — ABNORMAL LOW (ref 3.5–5.0)
Alkaline Phosphatase: 246 U/L — ABNORMAL HIGH (ref 38–126)
Anion gap: 12 (ref 5–15)
BUN: 31 mg/dL — ABNORMAL HIGH (ref 6–20)
CO2: 23 mmol/L (ref 22–32)
Calcium: 8.5 mg/dL — ABNORMAL LOW (ref 8.9–10.3)
Chloride: 96 mmol/L — ABNORMAL LOW (ref 98–111)
Creatinine, Ser: 1.27 mg/dL — ABNORMAL HIGH (ref 0.61–1.24)
GFR calc Af Amer: >60 mL/min (ref >60)
GFR calc non Af Amer: >60 mL/min (ref >60)
Glucose, Bld: 205 mg/dL — ABNORMAL HIGH (ref 70–99)
Potassium: 4.5 mmol/L (ref 3.5–5.1)
Sodium: 131 mmol/L — ABNORMAL LOW (ref 135–145)
Total Bilirubin: 0.2 mg/dL — ABNORMAL LOW (ref 0.3–1.2)
Total Protein: 6.5 g/dL (ref 6.5–8.1)

## 2020-02-09 LAB — GLUCOSE, CAPILLARY
Glucose-Capillary: 189 mg/dL — ABNORMAL HIGH (ref 70–99)
Glucose-Capillary: 212 mg/dL — ABNORMAL HIGH (ref 70–99)
Glucose-Capillary: 168 mg/dL — ABNORMAL HIGH (ref 70–99)
Glucose-Capillary: 198 mg/dL — ABNORMAL HIGH (ref 70–99)
Glucose-Capillary: 210 mg/dL — ABNORMAL HIGH (ref 70–99)

## 2020-02-09 MED ORDER — LORAZEPAM 2 MG/ML IJ SOLN
0.5000 mg | Freq: Once | INTRAMUSCULAR | Status: AC | PRN
  Administered 2020-02-11: 0.5 mg via INTRAVENOUS
  Filled 2020-02-09: qty 1

## 2020-02-09 NOTE — Progress Notes (Signed)
Pt unable to tolerate MRI due to anxiety. MD KC was notified and he put in for Ativan (see MAR).   Leonia Reeves, RN, BSN

## 2020-02-09 NOTE — Progress Notes (Signed)
PT Cancellation Note  Patient Details Name: Chris Bowman MRN: [id number] DOB: 1961-01-09   Cancelled Treatment:    Reason Eval/Treat Not Completed: Other (comment).  Headed to MRI, will reattempt as time and pt allow.   [name] 02/09/2020, 2:53 PM  Samul Dada, PT MS Acute Rehab Dept. Number: [facility] [id number] and [facility] [phone]

## 2020-02-09 NOTE — Progress Notes (Signed)
[facility] for Infectious Disease  Date of Admission:  02/01/2020     Total days of antibiotics 9         ASSESSMENT:  Chris Bowman has new onset fever in the setting of treatment for MSSA osteomyelitis s/p right transmetatarsal amputation which appears to be healing well. Repeat blood cultures have been obtained and pending. Previously noted to have fluid collection in cervical spine and agree with re-imaging to determine any changes which may certainly cause fever. Previous TEE negative for endocarditis. He does have a Stage II pressure wound on his sacrum which does not appear infected. Will await repeat blood culture and imaging results. Continue current dose of cefazolin. Encouraged frequent turning to offload pressure on sacrum.   PLAN:  1. Continue current dose of cefazolin. 2. Monitor repeat cultures 3. Await results of imaging to determine worsening of neck fluid collection 4. Wound care per orthopedics for the right foot.  5. Frequent turning and site off loading of pressure injury.   Principal Problem:   Sepsis due to undetermined organism [facility]) Active Problems:   Type 2 diabetes mellitus with hyperglycemia, without long-term current use of insulin (HCC)   Ulcer of left foot due to type 2 diabetes mellitus ([place])   Hyponatremia   Renal insufficiency   Weakness   Falls frequently   Osteomyelitis of foot, right, acute (HCC)   AKI (acute kidney injury) ([place])   Fever   Staphylococcus aureus bacteremia   . Chlorhexidine Gluconate Cloth  6 each Topical Daily  . feeding supplement (GLUCERNA SHAKE)  237 mL Oral TID BM  . feeding supplement (PRO-STAT SUGAR FREE 64)  30 mL Oral BID  . insulin aspart  0-15 Units Subcutaneous TID WC  . insulin aspart  0-5 Units Subcutaneous QHS  . insulin aspart protamine- aspart  8 Units Subcutaneous BID WC  . insulin starter kit- pen needles  1 kit Other Once  . multivitamin with minerals  1 tablet Oral Daily  . senna-docusate  1 tablet  Oral BID  . sodium chloride flush  3 mL Intravenous Q12H  . tamsulosin  0.4 mg Oral Daily  . vitamin B-12  1,000 mcg Oral Daily    SUBJECTIVE:  Chris Bowman was last seen by ID service on 3/8. Since that time he has developed a fever on 3/11. WBC count has remained stable. Repeat blood cultures were drawn today. Previously noted to have fluid collection along the lower cervical spine with concern for abscesses with suspected myositis.  Repeat imaging has been ordered. Chris Bowman has not had any changes in pain.   Allergies  Allergen Reactions  . Codeine     Kept sedated      Review of Systems: Review of Systems  Constitutional: Negative for chills, fever and weight loss.  Respiratory: Negative for cough, shortness of breath and wheezing.   Cardiovascular: Negative for chest pain and leg swelling.  Gastrointestinal: Negative for abdominal pain, constipation, diarrhea, nausea and vomiting.  Skin: Negative for rash.    OBJECTIVE: Vitals:   02/08/20 2100 02/08/20 2146 02/09/20 0451 02/09/20 1106  BP: 133/75  136/79 132/67  Pulse: 92  79 89  Resp: 16  16 18   Temp: (!) 101 F (38.3 C) 99.8 F (37.7 C) 98.4 F (36.9 C) 99.6 F (37.6 C)  TempSrc: Oral Oral Oral Oral  SpO2: 96%  100% 93%  Weight:      Height:       Body mass index is  30.94 kg/m.  Physical Exam Constitutional:      General: He is not in acute distress.    Appearance: He is well-developed.  Cardiovascular:     Rate and Rhythm: Normal rate and regular rhythm.     Heart sounds: Normal heart sounds.  Pulmonary:     Effort: Pulmonary effort is normal.     Breath sounds: Normal breath sounds.  Musculoskeletal:     Comments: Right foot surgical wound appears well approximated with sutures intact. There is minimal drainage and no odor. Appears to be healing well.   Skin:    General: Skin is warm and dry.     Comments: Stage 2 pressure wound noted on sacrum and covered with foam dressing.  Neurological:      Mental Status: He is alert and oriented to person, place, and time.  Psychiatric:        Behavior: Behavior normal.        Thought Content: Thought content normal.        Judgment: Judgment normal.     Lab Results Lab Results  Component Value Date   WBC 9.7 02/09/2020   HGB 9.2 (L) 02/09/2020   HCT 29.3 (L) 02/09/2020   MCV 71.8 (L) 02/09/2020   PLT 469 (H) 02/09/2020    Lab Results  Component Value Date   CREATININE 1.27 (H) 02/09/2020   BUN 31 (H) 02/09/2020   NA 131 (L) 02/09/2020   K 4.5 02/09/2020   CL 96 (L) 02/09/2020   CO2 23 02/09/2020    Lab Results  Component Value Date   ALT 78 (H) 02/09/2020   AST 102 (H) 02/09/2020   ALKPHOS 246 (H) 02/09/2020   BILITOT 0.2 (L) 02/09/2020     Microbiology: Recent Results (from the past 240 hour(s))  Culture, blood (routine x 2)     Status: Abnormal   Collection Time: 02/01/20  9:20 AM   Specimen: BLOOD LEFT ARM  Result Value Ref Range Status   Specimen Description BLOOD LEFT ARM  Final   Special Requests   Final    BOTTLES DRAWN AEROBIC AND ANAEROBIC Blood Culture adequate volume   Culture  Setup Time   Final    IN BOTH AEROBIC AND ANAEROBIC BOTTLES GRAM POSITIVE COCCI IN CLUSTERS CRITICAL RESULT CALLED TO, READ BACK BY AND VERIFIED WITH: Diona Browner PHARMD 02/01/20 2244 JDW Performed at [facility], [address] [place], [place] [address]    Culture STAPHYLOCOCCUS AUREUS (A)  Final   Report Status 02/04/2020 FINAL  Final   Organism ID, Bacteria STAPHYLOCOCCUS AUREUS  Final      Susceptibility   Staphylococcus aureus - MIC*    CIPROFLOXACIN <=0.5 SENSITIVE Sensitive     ERYTHROMYCIN <=0.25 SENSITIVE Sensitive     GENTAMICIN <=0.5 SENSITIVE Sensitive     OXACILLIN <=0.25 SENSITIVE Sensitive     TETRACYCLINE <=1 SENSITIVE Sensitive     VANCOMYCIN 1 SENSITIVE Sensitive     TRIMETH/SULFA <=10 SENSITIVE Sensitive     CLINDAMYCIN <=0.25 SENSITIVE Sensitive     RIFAMPIN <=0.5 SENSITIVE Sensitive     Inducible  Clindamycin NEGATIVE Sensitive     * STAPHYLOCOCCUS AUREUS  Culture, blood (routine x 2)     Status: Abnormal   Collection Time: 02/01/20  9:30 AM   Specimen: BLOOD LEFT HAND  Result Value Ref Range Status   Specimen Description BLOOD LEFT HAND  Final   Special Requests   Final    BOTTLES DRAWN AEROBIC ONLY  Blood Culture results may not be optimal due to an inadequate volume of blood received in culture bottles   Culture  Setup Time   Final    AEROBIC BOTTLE ONLY GRAM POSITIVE COCCI IN CLUSTERS Organism ID to follow CRITICAL RESULT CALLED TO, READ BACK BY AND VERIFIED WITH: V BRYK PHARMD 02/02/20 0213 JDW    Culture (A)  Final    STAPHYLOCOCCUS AUREUS SUSCEPTIBILITIES PERFORMED ON PREVIOUS CULTURE WITHIN THE LAST 5 DAYS. Performed at [facility], [place] [address], [place], [place] [address]    Report Status 02/04/2020 FINAL  Final  Blood Culture ID Panel (Reflexed)     Status: Abnormal   Collection Time: 02/01/20  9:30 AM  Result Value Ref Range Status   Enterococcus species NOT DETECTED NOT DETECTED Final   Listeria monocytogenes NOT DETECTED NOT DETECTED Final   Staphylococcus species DETECTED (A) NOT DETECTED Final    Comment: CRITICAL RESULT CALLED TO, READ BACK BY AND VERIFIED WITH: V BRYK PHARMD 02/02/20 0213 JDW    Staphylococcus aureus (BCID) DETECTED (A) NOT DETECTED Final    Comment: Methicillin (oxacillin) susceptible Staphylococcus aureus (MSSA). Preferred therapy is anti staphylococcal beta lactam antibiotic (Cefazolin or Nafcillin), unless clinically contraindicated. CRITICAL RESULT CALLED TO, READ BACK BY AND VERIFIED WITH: V BRYK PHARMD 02/02/20 0213 JDW    Methicillin resistance NOT DETECTED NOT DETECTED Final   Streptococcus species NOT DETECTED NOT DETECTED Final   Streptococcus agalactiae NOT DETECTED NOT DETECTED Final   Streptococcus pneumoniae NOT DETECTED NOT DETECTED Final   Streptococcus pyogenes NOT DETECTED NOT DETECTED Final   Acinetobacter baumannii  NOT DETECTED NOT DETECTED Final   Enterobacteriaceae species NOT DETECTED NOT DETECTED Final   Enterobacter cloacae complex NOT DETECTED NOT DETECTED Final   Escherichia coli NOT DETECTED NOT DETECTED Final   Klebsiella oxytoca NOT DETECTED NOT DETECTED Final   Klebsiella pneumoniae NOT DETECTED NOT DETECTED Final   Proteus species NOT DETECTED NOT DETECTED Final   Serratia marcescens NOT DETECTED NOT DETECTED Final   Haemophilus influenzae NOT DETECTED NOT DETECTED Final   Neisseria meningitidis NOT DETECTED NOT DETECTED Final   Pseudomonas aeruginosa NOT DETECTED NOT DETECTED Final   Candida albicans NOT DETECTED NOT DETECTED Final   Candida glabrata NOT DETECTED NOT DETECTED Final   Candida krusei NOT DETECTED NOT DETECTED Final   Candida parapsilosis NOT DETECTED NOT DETECTED Final   Candida tropicalis NOT DETECTED NOT DETECTED Final    Comment: Performed at [facility], [place] [address], [place], [place] [address]  SARS CORONAVIRUS 2 (TAT 6-24 HRS) Nasopharyngeal Nasopharyngeal Swab     Status: None   Collection Time: 02/01/20 10:26 AM   Specimen: Nasopharyngeal Swab  Result Value Ref Range Status   SARS Coronavirus 2 NEGATIVE NEGATIVE Final    Comment: (NOTE) SARS-CoV-2 target nucleic acids are NOT DETECTED. The SARS-CoV-2 RNA is generally detectable in upper and lower respiratory specimens during the acute phase of infection. Negative results do not preclude SARS-CoV-2 infection, do not rule out co-infections with other pathogens, and should not be used as the sole basis for treatment or other patient management decisions. Negative results must be combined with clinical observations, patient history, and epidemiological information. The expected result is Negative. Fact Sheet for Patients: SugarRoll.be Fact Sheet for Healthcare Providers: https://www.woods-mathews.com/ This test is not yet approved or cleared by the [place]  FDA and  has been authorized for detection and/or diagnosis of SARS-CoV-2 by FDA under an Emergency Use Authorization (EUA).  This EUA will remain  in effect (meaning this test can be used) for the duration of the COVID-19 declaration under Section 56 4(b)(1) of the Act, 21 U.S.C. section 360bbb-3(b)(1), unless the authorization is terminated or revoked sooner. Performed at [facility], [place] [address], [place], [place] [address]   Urine culture     Status: Abnormal   Collection Time: 02/01/20  3:51 PM   Specimen: In/Out Cath Urine  Result Value Ref Range Status   Specimen Description IN/OUT CATH URINE  Final   Special Requests   Final    NONE Performed at [facility], [place] [address] [place], [place] [address]    Culture 50,000 COLONIES/mL STAPHYLOCOCCUS AUREUS (A)  Final   Report Status 02/03/2020 FINAL  Final   Organism ID, Bacteria STAPHYLOCOCCUS AUREUS (A)  Final      Susceptibility   Staphylococcus aureus - MIC*    CIPROFLOXACIN <=0.5 SENSITIVE Sensitive     GENTAMICIN <=0.5 SENSITIVE Sensitive     NITROFURANTOIN <=16 SENSITIVE Sensitive     OXACILLIN <=0.25 SENSITIVE Sensitive     TETRACYCLINE <=1 SENSITIVE Sensitive     VANCOMYCIN 1 SENSITIVE Sensitive     TRIMETH/SULFA <=10 SENSITIVE Sensitive     CLINDAMYCIN <=0.25 SENSITIVE Sensitive     RIFAMPIN <=0.5 SENSITIVE Sensitive     Inducible Clindamycin NEGATIVE Sensitive     * 50,000 COLONIES/mL STAPHYLOCOCCUS AUREUS  MRSA PCR Screening     Status: None   Collection Time: 02/02/20  9:37 AM   Specimen: Nasal Mucosa; Nasopharyngeal  Result Value Ref Range Status   MRSA by PCR NEGATIVE NEGATIVE Final    Comment:        The GeneXpert MRSA Assay (FDA approved for NASAL specimens only), is one component of a comprehensive MRSA colonization surveillance program. It is not intended to diagnose MRSA infection nor to guide or monitor treatment for MRSA infections. Performed at [facility], [place]  [address] [place], [place] [address]   Surgical pcr screen     Status: Abnormal   Collection Time: 02/02/20 10:41 PM   Specimen: Nasal Mucosa; Nasal Swab  Result Value Ref Range Status   MRSA, PCR NEGATIVE NEGATIVE Final   Staphylococcus aureus POSITIVE (A) NEGATIVE Final    Comment: (NOTE) The Xpert SA Assay (FDA approved for NASAL specimens in patients 80 years of age and older), is one component of a comprehensive surveillance program. It is not intended to diagnose infection nor to guide or monitor treatment. Performed at [facility], [place] [address] [place], [place] [address]   Culture, blood (routine x 2)     Status: None   Collection Time: 02/03/20  2:24 PM   Specimen: BLOOD  Result Value Ref Range Status   Specimen Description BLOOD LEFT ARM  Final   Special Requests   Final    BOTTLES DRAWN AEROBIC AND ANAEROBIC Blood Culture adequate volume   Culture   Final    NO GROWTH 5 DAYS Performed at [facility], [address], [place],  [address]    Report Status 02/08/2020 FINAL  Final  Culture, blood (routine x 2)     Status: None   Collection Time: 02/03/20  2:26 PM   Specimen: BLOOD  Result Value Ref Range Status   Specimen Description BLOOD LEFT HAND  Final   Special Requests   Final    BOTTLES DRAWN AEROBIC AND ANAEROBIC Blood Culture adequate volume  Culture   Final    NO GROWTH 5 DAYS Performed at [facility], [place] [address] [place], [place] [address]    Report Status 02/08/2020 FINAL  Final     Terri Piedra, NP [facility] [phone] Pager  02/09/2020  4:28 PM

## 2020-02-09 NOTE — Progress Notes (Signed)
PROGRESS NOTE    Chris Bowman  OVZ:[id number] DOB: Jan 14, 1961 DOA: 02/01/2020 PCP: Patient, No Pcp Per   Brief Narrative: 59 year old male with UNknown medical history, who has not seen a doctor since thousand 18 presented to the ER on 3/4 with weakness and falls x2 weeks and associated fever dyspnea cough, chronic buttock pain since the fall along with limitation of range of motion, right arm pain.  Apparently diagnosed with diabetes in thousand and but did not think he needed medicine for it, reported history of taking too many baby aspirin and having stomach pain" I have a little incision is scar from that" in the ED afebrile Covid negative WBC 20.4K glucose 300 A1c 11 sodium 120,patient was admitted under hospitalist.Work-up showed MSSA UTI, bacteremia bilateral foot osteomyelitis with associated sepsis and concern for cervical spine epidural abscess/phlegmon C3-C5 no cord compression, no evidence of discitis or osteomyelitis.Patient was seen by neurosurgery, infectious disease.Patient underwent TEE-negative for endocarditis, MRI spine discitis/osteo-/small phlegmon, is a status post I&D of the left toe, 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  Patient is on Ancef as per ID and will need 6 weeks of IV antibiotic, repeat blood culture on 3/6 and PICC line 3/10.On 3/8 urine retention needing in and out cath and subsequently needing Foley catheter.  Subjective:  He feels better today.Had a bowel movement.  Again temperature spike 1012/10/20 9 PM,but WBC improving at 9.7 No other complaints.Thinks he is able to think better now. He was not aware that he had a fever last night.  Assessment & Plan:  MSSA sepsis due to bilateral foot osteomyelitis/MSSA UTI/cervical C3-C5 osteomyelitis/discitis with epidural phlegmon: Patient with sepsis on presentation.Etiology as above.  WBC nicely improving.  She is afebrile.  Seen by multiple consultants including ID,  orthopedics.Underwent TEE negative for endocarditis, normal EF with tiny PFO with right to left shunt, status post I&D of ulcer on the left great toe, s/p TMA ray amputation of right foot with wound VAC placement 3/6-wound VAC is not functioning well and removed 3/11-advised to continue daily dry dressing change. Neurosurgery recommends 6 weeks to 3 months and repeat MRI C-spine with and without contrast in 6 weeks or sooner if progressive myelopathy develops if develops worse back pain will need MRI lumbar spine with and without contrast.Back pain is resolved.Continue Ancef x6 weeks as per Dr. Graylon Good. apprecaite  ID orthopedic and neurosurgery input. He needs follow-up with ID clinic in 4 weeks.Patient having intermittent fever spike, discussed and asked Dr Graylon Good to eval and per discussion will order blood culture and MRI of the cervical spine to assess previous present phlegmon.  Scalene Myositis: Prevertebral edema with right-sided paravertebral fluid collections in the mid to lower cervical spine concerning for infection and abscesses with suspected myositis involving the right-sided scalene musculature noted on MRI. Case discussed with ENT Dr. Wilburn Cornelia who recommend antibiotic treatment,no surgical intervention needed  Urine retention continue supportive care.Placed on indwelling Foley- placed 3/8 when he had 700 ml urine on bladder scan. Voiding trial once more ambulatory.  AKI secondary to ATN in the setting of sepsis.No obstruction in the ultrasound.  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  Monitor renal function closely. Creat at 1.3  Type 2 diabetes mellitus with hyperglycemia, without long-term current use of insulin: hba1c uncontrolled at 11.3.  Started on Levemir and changed to insulin 70/30, continue diabetic coordinator follow-up and adjust insulin.  Sugar fairly controlled.   Recent Labs  Lab 02/08/20 1635 02/08/20  2109 02/09/20 0647 02/09/20 0753 02/09/20 1103  GLUCAP 266* 168*  189* 212* 168*   Ulcer of left foot due to type 2 diabetes mellitus cont wound care  Hyponatremia: Likely from hypovolemia. Sodium improving 129. Monitor  Generalized weakness/frequent fall : Continue PT OT and will need a skilled nursing facility placement.  SW working on it.  Discussed with Education officer, museum.  Seen by neurology sent neuropathy,  B1 B6 level, which are WNL, MMA slightly up at 403,.  Transaminitis with ALP elevation. AST/ALT also elevated.171/102->102/78- overall stable. Hepatitis panel negative.  Likely from sepsis.  Ultrasound shows cholelithiasis but no obstruction. Monitor.  Microcytic anemia B12 normal folate normal TSH normal iron panel, with elevated ferritin 543, iron low at 12, TIBC low at 174.  Delusional disorder/impaired memory MRI brain showed mild cerebral atrophy and chronic small vessel ischemia.  Currently patient is alert awake oriented, follows commands.  Has some tangential thought process.  HIV screen negative. he reports he is feeling much more clear and able to think better.  He directs towards his sister for any kind of decision.  Nutrition: Nutrition Problem: Increased nutrient needs Etiology: wound healing Signs/Symptoms: estimated needs Interventions: Glucerna shake, MVI Body mass index is 30.94 kg/m.   DVT prophylaxis:SCD Code Status:full Family Communication: plan of care discussed with patient at bedside.updated sister over the phone. She is working on placement- vs home w/ h, working with Development worker, community. Disposition Plan: Patient is from:Home Anticipated Disposition: to skilled nursing facility Barriers to discharge or conditions that needs to be met prior to discharge: Patient admitted with MSSA sepsis foot osteomyelitis, cervical osteo/discitis, remains hospitalized for IV antibiotics.  Having intermittent fever-ID eval requested. Plan for SNF  for IV antibiotics x6 weeks.  Consultants:ID, neurology, cardiology for TEE, neurosurgery,  orthopedics  Procedures:see note Microbiology: Urine culture MSSA, blood culture MSSA, repeat BLOOD CX 3/6 - so far   Medications: Scheduled Meds: . Chlorhexidine Gluconate Cloth  6 each Topical Daily  . feeding supplement (GLUCERNA SHAKE)  237 mL Oral TID BM  . feeding supplement (PRO-STAT SUGAR FREE 64)  30 mL Oral BID  . insulin aspart  0-15 Units Subcutaneous TID WC  . insulin aspart  0-5 Units Subcutaneous QHS  . insulin aspart protamine- aspart  8 Units Subcutaneous BID WC  . insulin starter kit- pen needles  1 kit Other Once  . multivitamin with minerals  1 tablet Oral Daily  . senna-docusate  1 tablet Oral BID  . sodium chloride flush  3 mL Intravenous Q12H  . tamsulosin  0.4 mg Oral Daily  . vitamin B-12  1,000 mcg Oral Daily   Continuous Infusions: . sodium chloride 10 mL/hr at 02/09/20 1017  .  ceFAZolin (ANCEF) IV 2 g (02/09/20 0600)  . lactated ringers Stopped (02/03/20 1146)  . methocarbamol (ROBAXIN) IV      Antimicrobials: Anti-infectives (From admission, onward)   Start     Dose/Rate Route Frequency Ordered Stop   02/03/20 1130  ceFAZolin (ANCEF) IVPB 1 g/50 mL premix  Status:  Discontinued     1 g 100 mL/hr over 30 Minutes Intravenous Every 6 hours 02/03/20 1124 02/03/20 1139   02/02/20 1100  vancomycin (VANCOREADY) IVPB 750 mg/150 mL  Status:  Discontinued     750 mg 150 mL/hr over 60 Minutes Intravenous Every 24 hours 02/01/20 1218 02/02/20 0221   02/02/20 0600  ceFAZolin (ANCEF) IVPB 2g/100 mL premix     2 g 200 mL/hr over 30 Minutes Intravenous Every  8 hours 02/02/20 0221     02/01/20 2230  ceFEPIme (MAXIPIME) 2 g in sodium chloride 0.9 % 100 mL IVPB  Status:  Discontinued     2 g 200 mL/hr over 30 Minutes Intravenous Every 12 hours 02/01/20 1218 02/02/20 0221   02/01/20 1200  metroNIDAZOLE (FLAGYL) IVPB 500 mg  Status:  Discontinued     500 mg 100 mL/hr over 60 Minutes Intravenous Every 8 hours 02/01/20 1149 02/02/20 0221   02/01/20 1015  ceFEPIme  (MAXIPIME) 2 g in sodium chloride 0.9 % 100 mL IVPB     2 g 200 mL/hr over 30 Minutes Intravenous  Once 02/01/20 1008 02/01/20 1056   02/01/20 1015  vancomycin (VANCOREADY) IVPB 1750 mg/350 mL     1,750 mg 175 mL/hr over 120 Minutes Intravenous  Once 02/01/20 1008 02/01/20 1336     Objective: Vitals: Today's Vitals   02/08/20 2146 02/09/20 0451 02/09/20 0854 02/09/20 1106  BP:  136/79  132/67  Pulse:  79  89  Resp:  16  18  Temp: 99.8 F (37.7 C) 98.4 F (36.9 C)  99.6 F (37.6 C)  TempSrc: Oral Oral  Oral  SpO2:  100%  93%  Weight:      Height:      PainSc: 1   0-No pain     Intake/Output Summary (Last 24 hours) at 02/09/2020 1151 Last data filed at 02/09/2020 0758 Gross per 24 hour  Intake 1509.99 ml  Output 3325 ml  Net -1815.01 ml   Filed Weights   02/01/20 0801 02/01/20 2300 02/04/20 2107  Weight: 68 kg 83 kg 92.3 kg   Weight change:    Intake/Output from previous day: 03/11 0701 - 03/12 0700 In: 1920 [P.O.:620; IV Piggyback:200] Out: 2475 [Urine:2475] Intake/Output this shift: Total I/O In: 240 [P.O.:240] Out: 1050 [Urine:1050]  Examination:  General exam:Alert awake oriented,somewhat forgetful, not in distress. HEENT:Oral mucosa moist, Ear/Nose WNL grossly,dentition normal. Respiratory system:Bilaterally clear,no wheezing or crackles,no use of accessory muscle, non tender. Cardiovascular system:S1 & S2 +, regular, No JVD. Gastrointestinal system:Abdomen soft, NT,ND, BS+. Nervous System:Alert, awake, moving extremities and grossly non-focal. Extremities:No edema, distal peripheral pulses palpable.  Skin:No rashes,no icterus. NWG:NFAOZH muscle bulk,tone, power right foot with dressing intact-did not remove it.  Data Reviewed: I have personally reviewed following labs and imaging studies. CBC: Recent Labs  Lab 02/05/20 0500 02/06/20 0421 02/07/20 0347 02/08/20 0430 02/09/20 0458  WBC 12.8* 12.6* 11.6* 10.7* 9.7  NEUTROABS 9.8* 9.6* 8.6* 8.1* 6.3   HGB 9.9* 9.1* 9.3* 8.8* 9.2*  HCT 31.3* 27.8* 29.0* 28.1* 29.3*  MCV 71.6* 69.3* 69.9* 71.1* 71.8*  PLT 404* 411* 440* 457* 086*   Basic Metabolic Panel: Recent Labs  Lab 02/03/20 0323 02/03/20 0323 02/04/20 0410 02/04/20 0410 02/05/20 0500 02/06/20 0421 02/07/20 0347 02/08/20 0430 02/09/20 0458  NA 124*   < > 126*   < > 125* 127* 127* 129* 131*  K 4.1   < > 4.5   < > 4.7 5.0 4.7 4.5 4.5  CL 93*   < > 92*   < > 92* 93* 91* 93* 96*  CO2 21*   < > 24   < > 24 23 25 24 23   GLUCOSE 225*   < > 253*   < > 264* 154* 178* 188* 205*  BUN 31*   < > 24*   < > 24* 24* 26* 27* 31*  CREATININE 1.57*   < > 1.53*   < > 1.54*  1.37* 1.35* 1.36* 1.27*  CALCIUM 7.8*   < > 8.1*   < > 8.2* 8.1* 8.3* 8.1* 8.5*  MG 1.5*  --  2.0  --  2.0  --   --   --   --    < > = values in this interval not displayed.   GFR: Estimated Creatinine Clearance: 70.8 mL/min (A) (by C-G formula based on SCr of 1.27 mg/dL (H)). Liver Function Tests: Recent Labs  Lab 02/05/20 0500 02/06/20 0421 02/07/20 0347 02/08/20 0430 02/09/20 0458  AST 92* 145* 212* 171* 102*  ALT 41 62* 100* 102* 78*  ALKPHOS 248* 229* 241* 256* 246*  BILITOT 0.2* 0.2* 0.4 0.3 0.2*  PROT 6.8 6.3* 6.3* 6.2* 6.5  ALBUMIN 1.7* 1.6* 1.6* 1.7* 1.8*   Recent Labs  Lab 02/03/20 0323  LIPASE 30   No results for input(s): AMMONIA in the last 168 hours. Coagulation Profile: No results for input(s): INR, PROTIME in the last 168 hours. Cardiac Enzymes: Recent Labs  Lab 02/03/20 0323  CKTOTAL 62   BNP (last 3 results) No results for input(s): PROBNP in the last 8760 hours. HbA1C: No results for input(s): HGBA1C in the last 72 hours. CBG: Recent Labs  Lab 02/08/20 1635 02/08/20 2109 02/09/20 0647 02/09/20 0753 02/09/20 1103  GLUCAP 266* 168* 189* 212* 168*   Lipid Profile: No results for input(s): CHOL, HDL, LDLCALC, TRIG, CHOLHDL, LDLDIRECT in the last 72 hours. Thyroid Function Tests: No results for input(s): TSH, T4TOTAL,  FREET4, T3FREE, THYROIDAB in the last 72 hours. Anemia Panel: No results for input(s): VITAMINB12, FOLATE, FERRITIN, TIBC, IRON, RETICCTPCT in the last 72 hours. Sepsis Labs: Recent Labs  Lab 02/03/20 0323  LATICACIDVEN 0.8    Recent Results (from the past 240 hour(s))  Culture, blood (routine x 2)     Status: Abnormal   Collection Time: 02/01/20  9:20 AM   Specimen: BLOOD LEFT ARM  Result Value Ref Range Status   Specimen Description BLOOD LEFT ARM  Final   Special Requests   Final    BOTTLES DRAWN AEROBIC AND ANAEROBIC Blood Culture adequate volume   Culture  Setup Time   Final    IN BOTH AEROBIC AND ANAEROBIC BOTTLES GRAM POSITIVE COCCI IN CLUSTERS CRITICAL RESULT CALLED TO, READ BACK BY AND VERIFIED WITH: Diona Browner PHARMD 02/01/20 2244 JDW Performed at [facility], [place] [address], [place], [place] [address]    Culture STAPHYLOCOCCUS AUREUS (A)  Final   Report Status 02/04/2020 FINAL  Final   Organism ID, Bacteria STAPHYLOCOCCUS AUREUS  Final      Susceptibility   Staphylococcus aureus - MIC*    CIPROFLOXACIN <=0.5 SENSITIVE Sensitive     ERYTHROMYCIN <=0.25 SENSITIVE Sensitive     GENTAMICIN <=0.5 SENSITIVE Sensitive     OXACILLIN <=0.25 SENSITIVE Sensitive     TETRACYCLINE <=1 SENSITIVE Sensitive     VANCOMYCIN 1 SENSITIVE Sensitive     TRIMETH/SULFA <=10 SENSITIVE Sensitive     CLINDAMYCIN <=0.25 SENSITIVE Sensitive     RIFAMPIN <=0.5 SENSITIVE Sensitive     Inducible Clindamycin NEGATIVE Sensitive     * STAPHYLOCOCCUS AUREUS  Culture, blood (routine x 2)     Status: Abnormal   Collection Time: 02/01/20  9:30 AM   Specimen: BLOOD LEFT HAND  Result Value Ref Range Status   Specimen Description BLOOD LEFT HAND  Final   Special Requests   Final    BOTTLES DRAWN AEROBIC ONLY Blood Culture results may not  be optimal due to an inadequate volume of blood received in culture bottles   Culture  Setup Time   Final    AEROBIC BOTTLE ONLY GRAM POSITIVE COCCI IN CLUSTERS  Organism ID to follow CRITICAL RESULT CALLED TO, READ BACK BY AND VERIFIED WITH: V BRYK PHARMD 02/02/20 0213 JDW    Culture (A)  Final    STAPHYLOCOCCUS AUREUS SUSCEPTIBILITIES PERFORMED ON PREVIOUS CULTURE WITHIN THE LAST 5 DAYS. Performed at [facility], [place] [address], [place], [place] [address]    Report Status 02/04/2020 FINAL  Final  Blood Culture ID Panel (Reflexed)     Status: Abnormal   Collection Time: 02/01/20  9:30 AM  Result Value Ref Range Status   Enterococcus species NOT DETECTED NOT DETECTED Final   Listeria monocytogenes NOT DETECTED NOT DETECTED Final   Staphylococcus species DETECTED (A) NOT DETECTED Final    Comment: CRITICAL RESULT CALLED TO, READ BACK BY AND VERIFIED WITH: V BRYK PHARMD 02/02/20 0213 JDW    Staphylococcus aureus (BCID) DETECTED (A) NOT DETECTED Final    Comment: Methicillin (oxacillin) susceptible Staphylococcus aureus (MSSA). Preferred therapy is anti staphylococcal beta lactam antibiotic (Cefazolin or Nafcillin), unless clinically contraindicated. CRITICAL RESULT CALLED TO, READ BACK BY AND VERIFIED WITH: V BRYK PHARMD 02/02/20 0213 JDW    Methicillin resistance NOT DETECTED NOT DETECTED Final   Streptococcus species NOT DETECTED NOT DETECTED Final   Streptococcus agalactiae NOT DETECTED NOT DETECTED Final   Streptococcus pneumoniae NOT DETECTED NOT DETECTED Final   Streptococcus pyogenes NOT DETECTED NOT DETECTED Final   Acinetobacter baumannii NOT DETECTED NOT DETECTED Final   Enterobacteriaceae species NOT DETECTED NOT DETECTED Final   Enterobacter cloacae complex NOT DETECTED NOT DETECTED Final   Escherichia coli NOT DETECTED NOT DETECTED Final   Klebsiella oxytoca NOT DETECTED NOT DETECTED Final   Klebsiella pneumoniae NOT DETECTED NOT DETECTED Final   Proteus species NOT DETECTED NOT DETECTED Final   Serratia marcescens NOT DETECTED NOT DETECTED Final   Haemophilus influenzae NOT DETECTED NOT DETECTED Final   Neisseria  meningitidis NOT DETECTED NOT DETECTED Final   Pseudomonas aeruginosa NOT DETECTED NOT DETECTED Final   Candida albicans NOT DETECTED NOT DETECTED Final   Candida glabrata NOT DETECTED NOT DETECTED Final   Candida krusei NOT DETECTED NOT DETECTED Final   Candida parapsilosis NOT DETECTED NOT DETECTED Final   Candida tropicalis NOT DETECTED NOT DETECTED Final    Comment: Performed at [facility], [employer] [address], [place], [place] [address]  SARS CORONAVIRUS 2 (TAT 6-24 HRS) Nasopharyngeal Nasopharyngeal Swab     Status: None   Collection Time: 02/01/20 10:26 AM   Specimen: Nasopharyngeal Swab  Result Value Ref Range Status   SARS Coronavirus 2 NEGATIVE NEGATIVE Final    Comment: (NOTE) SARS-CoV-2 target nucleic acids are NOT DETECTED. The SARS-CoV-2 RNA is generally detectable in upper and lower respiratory specimens during the acute phase of infection. Negative results do not preclude SARS-CoV-2 infection, do not rule out co-infections with other pathogens, and should not be used as the sole basis for treatment or other patient management decisions. Negative results must be combined with clinical observations, patient history, and epidemiological information. The expected result is Negative. Fact Sheet for Patients: SugarRoll.be Fact Sheet for Healthcare Providers: https://www.woods-mathews.com/ This test is not yet approved or cleared by the [place] FDA and  has been authorized for detection and/or diagnosis of SARS-CoV-2 by FDA under an Emergency Use Authorization (EUA). This EUA will remain  in effect (meaning this test can be used) for the duration of the COVID-19 declaration under Section 56 4(b)(1) of the Act, 21 U.S.C. section 360bbb-3(b)(1), unless the authorization is terminated or revoked sooner. Performed at [facility], [place] [address] [place], [place] [address]   Urine culture     Status: Abnormal    Collection Time: 02/01/20  3:51 PM   Specimen: In/Out Cath Urine  Result Value Ref Range Status   Specimen Description IN/OUT CATH URINE  Final   Special Requests   Final    NONE Performed at [facility], [place] [address] [place], [place] [address]    Culture 50,000 COLONIES/mL STAPHYLOCOCCUS AUREUS (A)  Final   Report Status 02/03/2020 FINAL  Final   Organism ID, Bacteria STAPHYLOCOCCUS AUREUS (A)  Final      Susceptibility   Staphylococcus aureus - MIC*    CIPROFLOXACIN <=0.5 SENSITIVE Sensitive     GENTAMICIN <=0.5 SENSITIVE Sensitive     NITROFURANTOIN <=16 SENSITIVE Sensitive     OXACILLIN <=0.25 SENSITIVE Sensitive     TETRACYCLINE <=1 SENSITIVE Sensitive     VANCOMYCIN 1 SENSITIVE Sensitive     TRIMETH/SULFA <=10 SENSITIVE Sensitive     CLINDAMYCIN <=0.25 SENSITIVE Sensitive     RIFAMPIN <=0.5 SENSITIVE Sensitive     Inducible Clindamycin NEGATIVE Sensitive     * 50,000 COLONIES/mL STAPHYLOCOCCUS AUREUS  MRSA PCR Screening     Status: None   Collection Time: 02/02/20  9:37 AM   Specimen: Nasal Mucosa; Nasopharyngeal  Result Value Ref Range Status   MRSA by PCR NEGATIVE NEGATIVE Final    Comment:        The GeneXpert MRSA Assay (FDA approved for NASAL specimens only), is one component of a comprehensive MRSA colonization surveillance program. It is not intended to diagnose MRSA infection nor to guide or monitor treatment for MRSA infections. Performed at [facility], [place] [address] [place], [place] [address]   Surgical pcr screen     Status: Abnormal   Collection Time: 02/02/20 10:41 PM   Specimen: Nasal Mucosa; Nasal Swab  Result Value Ref Range Status   MRSA, PCR NEGATIVE NEGATIVE Final   Staphylococcus aureus POSITIVE (A) NEGATIVE Final    Comment: (NOTE) The Xpert SA Assay (FDA approved for NASAL specimens in patients 15 years of age and older), is one component of a comprehensive surveillance program. It is not intended to diagnose infection  nor to guide or monitor treatment. Performed at [facility], [place] [address], [place], [place] [address]   Culture, blood (routine x 2)     Status: None   Collection Time: 02/03/20  2:24 PM   Specimen: BLOOD  Result Value Ref Range Status   Specimen Description BLOOD LEFT ARM  Final   Special Requests   Final    BOTTLES DRAWN AEROBIC AND ANAEROBIC Blood Culture adequate volume   Culture   Final    NO GROWTH 5 DAYS Performed at [facility], [address] [place], [place] [address]    Report Status 02/08/2020 FINAL  Final  Culture, blood (routine x 2)     Status: None   Collection Time: 02/03/20  2:26 PM   Specimen: BLOOD  Result Value Ref Range Status   Specimen Description BLOOD LEFT HAND  Final   Special Requests   Final    BOTTLES DRAWN AEROBIC AND ANAEROBIC Blood Culture adequate volume   Culture   Final  NO GROWTH 5 DAYS Performed at [facility], [place] [address], [place], [place] [address]    Report Status 02/08/2020 FINAL  Final      Radiology Studies: No results found.   LOS: 8 days   Time spent: More than 50% of that time was spent in counseling and/or coordination of care.  Antonieta Pert, MD [facility]  02/09/2020, 11:51 AM

## 2020-02-09 NOTE — Progress Notes (Signed)
OT Cancellation Note  Patient Details Name: Moritz Lever MRN: [id number] DOB: 1961/03/15   Cancelled Treatment:    Reason Eval/Treat Not Completed: Patient at procedure or test/ unavailable(Pt going to MRI.)  Evern Bio 02/09/2020, 1:48 PM  Martie Round, OTR/L Acute Rehabilitation Services Pager: [phone] Office: [phone]

## 2020-02-09 NOTE — Plan of Care (Signed)
  Problem: Health Behavior/Discharge Planning: Goal: Ability to manage health-related needs will improve Outcome: Progressing   

## 2020-02-10 DIAGNOSIS — B9561 Methicillin susceptible Staphylococcus aureus infection as the cause of diseases classified elsewhere: Secondary | ICD-10-CM | POA: Diagnosis present

## 2020-02-10 DIAGNOSIS — R7881 Bacteremia: Secondary | ICD-10-CM | POA: Diagnosis present

## 2020-02-10 DIAGNOSIS — G061 Intraspinal abscess and granuloma: Secondary | ICD-10-CM | POA: Diagnosis present

## 2020-02-10 LAB — COMPREHENSIVE METABOLIC PANEL
ALT: 50 U/L — ABNORMAL HIGH (ref 0–44)
AST: 56 U/L — ABNORMAL HIGH (ref 15–41)
Albumin: 1.8 g/dL — ABNORMAL LOW (ref 3.5–5.0)
Alkaline Phosphatase: 208 U/L — ABNORMAL HIGH (ref 38–126)
Anion gap: 10 (ref 5–15)
BUN: 32 mg/dL — ABNORMAL HIGH (ref 6–20)
CO2: 24 mmol/L (ref 22–32)
Calcium: 8.5 mg/dL — ABNORMAL LOW (ref 8.9–10.3)
Chloride: 97 mmol/L — ABNORMAL LOW (ref 98–111)
Creatinine, Ser: 1.21 mg/dL (ref 0.61–1.24)
GFR calc Af Amer: >60 mL/min (ref >60)
GFR calc non Af Amer: >60 mL/min (ref >60)
Glucose, Bld: 191 mg/dL — ABNORMAL HIGH (ref 70–99)
Potassium: 4.2 mmol/L (ref 3.5–5.1)
Sodium: 131 mmol/L — ABNORMAL LOW (ref 135–145)
Total Bilirubin: 0.3 mg/dL (ref 0.3–1.2)
Total Protein: 6.5 g/dL (ref 6.5–8.1)

## 2020-02-10 LAB — GLUCOSE, CAPILLARY
Glucose-Capillary: 209 mg/dL — ABNORMAL HIGH (ref 70–99)
Glucose-Capillary: 176 mg/dL — ABNORMAL HIGH (ref 70–99)
Glucose-Capillary: 287 mg/dL — ABNORMAL HIGH (ref 70–99)
Glucose-Capillary: 204 mg/dL — ABNORMAL HIGH (ref 70–99)

## 2020-02-10 LAB — CBC WITH DIFFERENTIAL/PLATELET
Abs Immature Granulocytes: 0.1 10*3/uL — ABNORMAL HIGH (ref 0.00–0.07)
Basophils Absolute: 0.1 10*3/uL (ref 0.0–0.1)
Basophils Relative: 1 %
Eosinophils Absolute: 0.2 10*3/uL (ref 0.0–0.5)
Eosinophils Relative: 2 %
HCT: 28.9 % — ABNORMAL LOW (ref 39.0–52.0)
Hemoglobin: 9 g/dL — ABNORMAL LOW (ref 13.0–17.0)
Immature Granulocytes: 1 %
Lymphocytes Relative: 16 %
Lymphs Abs: 1.7 10*3/uL (ref 0.7–4.0)
MCH: 22.3 pg — ABNORMAL LOW (ref 26.0–34.0)
MCHC: 31.1 g/dL (ref 30.0–36.0)
MCV: 71.7 fL — ABNORMAL LOW (ref 80.0–100.0)
Monocytes Absolute: 0.9 10*3/uL (ref 0.1–1.0)
Monocytes Relative: 8 %
Neutro Abs: 8.1 10*3/uL — ABNORMAL HIGH (ref 1.7–7.7)
Neutrophils Relative %: 72 %
Platelets: 447 10*3/uL — ABNORMAL HIGH (ref 150–400)
RBC: 4.03 MIL/uL — ABNORMAL LOW (ref 4.22–5.81)
RDW: 15.1 % (ref 11.5–15.5)
WBC: 11.1 10*3/uL — ABNORMAL HIGH (ref 4.0–10.5)
nRBC: 0 % (ref 0.0–0.2)

## 2020-02-10 LAB — VITAMIN B6: Vitamin B6: 3.9 ug/L — ABNORMAL LOW (ref 5.3–46.7)

## 2020-02-10 MED ORDER — LOPERAMIDE HCL 2 MG PO CAPS
2.0000 mg | ORAL_CAPSULE | ORAL | Status: DC | PRN
  Administered 2020-02-11: 2 mg via ORAL
  Filled 2020-02-10: qty 1

## 2020-02-10 NOTE — Progress Notes (Signed)
Advanced Directive paperwork delivered to Mr. Chris Bowman and his sister to look over. She will be here on Monday (3/15) at 2pm to complete the paperwork.   Rev. Margaretann Loveless Chaplain M. Div.

## 2020-02-10 NOTE — Progress Notes (Signed)
PROGRESS NOTE    Cherry Wittwer  HWE:[id number] DOB: November 27, 1961 DOA: 02/01/2020 PCP: Patient, No Pcp Per   Brief Narrative: 59 year old male with UNknown medical history, who has not seen a doctor since thousand 18 presented to the ER on 3/4 with weakness and falls x2 weeks and associated fever dyspnea cough, chronic buttock pain since the fall along with limitation of range of motion, right arm pain.  Apparently diagnosed with diabetes in thousand and but did not think he needed medicine for it, reported history of taking too many baby aspirin and having stomach pain" I have a little incision is scar from that" in the ED afebrile Covid negative WBC 20.4K glucose 300 A1c 11 sodium 120,patient was admitted under hospitalist.Work-up showed MSSA UTI, bacteremia bilateral foot osteomyelitis with associated sepsis and concern for cervical spine epidural abscess/phlegmon C3-C5 no cord compression, no evidence of discitis or osteomyelitis.Patient was seen by neurosurgery, infectious disease.Patient underwent TEE-negative for endocarditis, MRI spine discitis/osteo-/small phlegmon, is a status post I&D of the left toe, 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  Patient is on Ancef as per ID and will need 6 weeks of IV antibiotic, repeat blood culture on 3/6 and PICC line 3/10.On 3/8 urine retention needing in and out cath and subsequently needing Foley catheter. 3/11 temp spike 101 and 3/10 100.5. ID reconsulted and Blood cx/MRI c-t spine.  Subjective:  C/o BM frequently but not loose- stopping senna No neck or back pain. AAO MONTH/YEAR, current president, place No fever spike.  WBC slightly up 11.1K. Patellar stabilizing present in room air  Assessment & Plan:  MSSA sepsis due to bilateral foot osteomyelitis-status post right transmetatarsal amputation/MSSA UTI/cervical C3-C5  Fluid collection:  Seen by ID, orthopedics, neurosurgery. S/p TEE negative for endocarditis,  normal EF with tiny PFO with right to left shunt, status post I&D of ulcer on the left great toe, s/p TMA ray amputation of right foot with wound VAC placement 3/6-wound VAC out 3/11 and on healing well on daily dry dressing. Neurosurgery recommends 6 weeks to 3 months and repeat MRI C-spine with and without contrast in 6 weeks or sooner if progressive myelopathy develops if develops worse back pain will need MRI lumbar spine with and without contrast. He needs follow-up with ID clinic in 4 weeks and to cont w/ 6 wk of Ancef per ID.  On intermittent fever spike blood culture sent 3/12, MRI cervical and thoracic spine ordered to evaluate the previous phlegmon collection-did not get done yesterday due to claustrophobia, added Ativan preprocedure.  Multiple bowel movements, stop laxatives, as needed Imodium.  Its not loose.  Scalene Myositis: Prevertebral edema with right-sided paravertebral fluid collections in the mid to lower cervical spine concerning for infection and abscesses with suspected myositis involving the right-sided scalene musculature noted on MRI. Case discussed with ENT Dr. Wilburn Cornelia who recommend antibiotic treatment,no surgical intervention needed  Urine retention continue supportive care.Placed on indwelling Foley- placed 3/8 when he had 700 ml urine on bladder scan. Voiding trial once more ambulatory.  AKI secondary to ATN in the setting of sepsis.No obstruction in the ultrasound.  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  Monitor renal function closely. Creat at 1.3.  Type 2 diabetes mellitus with hyperglycemia, without long-term current use of insulin: hba1c uncontrolled at 11.3.  Started on Levemir and changed to insulin 70/30, continue diabetic coordinator follow-up and adjust insulin.  Sugar fairly controlled.   Recent Labs  Lab 02/09/20 0753 02/09/20 1103  02/09/20 1812 02/09/20 2113 02/10/20 0652  GLUCAP 212* 168* 198* 210* 209*   Ulcer of left foot due to type 2  diabetes mellitus cont wound care  Hyponatremia: Likely from hypovolemia. Sodium improving 129. Monitor  Generalized weakness/frequent fall : Continue PT OT and will need a skilled nursing facility placement.  SW working on it. Discussed with Education officer, museum.  Seen by neurology sent neuropathy,  B1 B6 level, which are WNL, MMA slightly up at 403,.  Transaminitis with ALP elevation. AST/ALT also elevated.171/102->102/78- overall stable. Hepatitis panel negative.  Likely from sepsis.  Ultrasound shows cholelithiasis but no obstruction. Monitor.  Microcytic anemia B12 normal folate normal TSH normal iron panel, with elevated ferritin 543, iron low at 12, TIBC low at 174.  Delusional disorder/impaired memory MRI brain showed mild cerebral atrophy and chronic small vessel ischemia.  Currently patient is alert awake oriented, follows commands.  Has some tangential thought process.  HIV screen negative. he reports he is feeling much more clear and able to think better.  He directs towards his sister for any kind of decision.  Nutrition: Nutrition Problem: Increased nutrient needs Etiology: wound healing Signs/Symptoms: estimated needs Interventions: Glucerna shake, MVI Body mass index is 30.1 kg/m.   DVT prophylaxis:SCD Code Status:full Family Communication: plan of care discussed with patient at bedside.updated sister over the phone. She is working on placement- vs home w/ h, working with Development worker, community. Disposition Plan: Patient is from:Home Anticipated Disposition: to skilled nursing facility Barriers to discharge or conditions that needs to be met prior to discharge: Patient admitted with MSSA sepsis foot osteomyelitis, cervical osteo/discitis, remains hospitalized for IV antibiotics.  Having intermittent fever-further MRI C and T-spine planned.Plan for SNF  for IV antibiotics x6 weeks.  Consultants:ID, neurology, cardiology for TEE, neurosurgery, orthopedics  Procedures:see  note Microbiology: Urine culture MSSA, blood culture MSSA, repeat BLOOD CX 3/6 - so far   Medications: Scheduled Meds: . Chlorhexidine Gluconate Cloth  6 each Topical Daily  . feeding supplement (GLUCERNA SHAKE)  237 mL Oral TID BM  . feeding supplement (PRO-STAT SUGAR FREE 64)  30 mL Oral BID  . insulin aspart  0-15 Units Subcutaneous TID WC  . insulin aspart  0-5 Units Subcutaneous QHS  . insulin aspart protamine- aspart  8 Units Subcutaneous BID WC  . insulin starter kit- pen needles  1 kit Other Once  . multivitamin with minerals  1 tablet Oral Daily  . senna-docusate  1 tablet Oral BID  . sodium chloride flush  3 mL Intravenous Q12H  . tamsulosin  0.4 mg Oral Daily  . vitamin B-12  1,000 mcg Oral Daily   Continuous Infusions: . sodium chloride 10 mL/hr at 02/09/20 1017  .  ceFAZolin (ANCEF) IV 2 g (02/10/20 0014)  . lactated ringers Stopped (02/03/20 1146)  . methocarbamol (ROBAXIN) IV      Antimicrobials: Anti-infectives (From admission, onward)   Start     Dose/Rate Route Frequency Ordered Stop   02/03/20 1130  ceFAZolin (ANCEF) IVPB 1 g/50 mL premix  Status:  Discontinued     1 g 100 mL/hr over 30 Minutes Intravenous Every 6 hours 02/03/20 1124 02/03/20 1139   02/02/20 1100  vancomycin (VANCOREADY) IVPB 750 mg/150 mL  Status:  Discontinued     750 mg 150 mL/hr over 60 Minutes Intravenous Every 24 hours 02/01/20 1218 02/02/20 0221   02/02/20 0600  ceFAZolin (ANCEF) IVPB 2g/100 mL premix     2 g 200 mL/hr over 30 Minutes Intravenous Every  8 hours 02/02/20 0221     02/01/20 2230  ceFEPIme (MAXIPIME) 2 g in sodium chloride 0.9 % 100 mL IVPB  Status:  Discontinued     2 g 200 mL/hr over 30 Minutes Intravenous Every 12 hours 02/01/20 1218 02/02/20 0221   02/01/20 1200  metroNIDAZOLE (FLAGYL) IVPB 500 mg  Status:  Discontinued     500 mg 100 mL/hr over 60 Minutes Intravenous Every 8 hours 02/01/20 1149 02/02/20 0221   02/01/20 1015  ceFEPIme (MAXIPIME) 2 g in sodium  chloride 0.9 % 100 mL IVPB     2 g 200 mL/hr over 30 Minutes Intravenous  Once 02/01/20 1008 02/01/20 1056   02/01/20 1015  vancomycin (VANCOREADY) IVPB 1750 mg/350 mL     1,750 mg 175 mL/hr over 120 Minutes Intravenous  Once 02/01/20 1008 02/01/20 1336     Objective: Vitals: Today's Vitals   02/09/20 2101 02/09/20 2220 02/09/20 2326 02/10/20 0439  BP: 114/71   122/76  Pulse: 93   83  Resp: 16   16  Temp: 98.3 F (36.8 C)   98.4 F (36.9 C)  TempSrc: Oral   Oral  SpO2: 92%   97%  Weight: 89.8 kg     Height:      PainSc:  3  Asleep     Intake/Output Summary (Last 24 hours) at 02/10/2020 0749 Last data filed at 02/10/2020 0200 Gross per 24 hour  Intake 1550.27 ml  Output 1625 ml  Net -74.73 ml   Filed Weights   02/01/20 2300 02/04/20 2107 02/09/20 2101  Weight: 83 kg 92.3 kg 89.8 kg   Weight change:    Intake/Output from previous day: 03/12 0701 - 03/13 0700 In: 1550.3 [P.O.:1140; I.V.:110.3; IV Piggyback:300] Out: 1625 [Urine:1625] Intake/Output this shift: No intake/output data recorded.  Examination:  General exam: AAO x3 NAD, weak appearing. HEENT:Oral mucosa moist, Ear/Nose WNL grossly, dentition normal. Respiratory system: bilaterally clear,no wheezing or crackles,no use of accessory muscle Cardiovascular system: S1 & S2 +, No JVD,. Gastrointestinal system: Abdomen soft, NT,ND, BS+ Nervous System:Alert, awake, moving extremities and grossly nonfocal Extremities: No edema, distal peripheral pulses palpable. RT foot w dressing intact.  Left grt toe base w wound healing- dressing+ Skin: No rashes,no icterus. MSK: Normal muscle bulk,tone, power  Data Reviewed: I have personally reviewed following labs and imaging studies. CBC: Recent Labs  Lab 02/06/20 0421 02/07/20 0347 02/08/20 0430 02/09/20 0458 02/10/20 0404  WBC 12.6* 11.6* 10.7* 9.7 11.1*  NEUTROABS 9.6* 8.6* 8.1* 6.3 8.1*  HGB 9.1* 9.3* 8.8* 9.2* 9.0*  HCT 27.8* 29.0* 28.1* 29.3* 28.9*  MCV  69.3* 69.9* 71.1* 71.8* 71.7*  PLT 411* 440* 457* 469* 915*   Basic Metabolic Panel: Recent Labs  Lab 02/04/20 0410 02/04/20 0410 02/05/20 0500 02/05/20 0500 02/06/20 0421 02/07/20 0347 02/08/20 0430 02/09/20 0458 02/10/20 0404  NA 126*   < > 125*   < > 127* 127* 129* 131* 131*  K 4.5   < > 4.7   < > 5.0 4.7 4.5 4.5 4.2  CL 92*   < > 92*   < > 93* 91* 93* 96* 97*  CO2 24   < > 24   < > 23 25 24 23 24   GLUCOSE 253*   < > 264*   < > 154* 178* 188* 205* 191*  BUN 24*   < > 24*   < > 24* 26* 27* 31* 32*  CREATININE 1.53*   < > 1.54*   < >  1.37* 1.35* 1.36* 1.27* 1.21  CALCIUM 8.1*   < > 8.2*   < > 8.1* 8.3* 8.1* 8.5* 8.5*  MG 2.0  --  2.0  --   --   --   --   --   --    < > = values in this interval not displayed.   GFR: Estimated Creatinine Clearance: 73.4 mL/min (by C-G formula based on SCr of 1.21 mg/dL). Liver Function Tests: Recent Labs  Lab 02/06/20 0421 02/07/20 0347 02/08/20 0430 02/09/20 0458 02/10/20 0404  AST 145* 212* 171* 102* 56*  ALT 62* 100* 102* 78* 50*  ALKPHOS 229* 241* 256* 246* 208*  BILITOT 0.2* 0.4 0.3 0.2* 0.3  PROT 6.3* 6.3* 6.2* 6.5 6.5  ALBUMIN 1.6* 1.6* 1.7* 1.8* 1.8*   No results for input(s): LIPASE, AMYLASE in the last 168 hours. No results for input(s): AMMONIA in the last 168 hours. Coagulation Profile: No results for input(s): INR, PROTIME in the last 168 hours. Cardiac Enzymes: No results for input(s): CKTOTAL, CKMB, CKMBINDEX, TROPONINI in the last 168 hours. BNP (last 3 results) No results for input(s): PROBNP in the last 8760 hours. HbA1C: No results for input(s): HGBA1C in the last 72 hours. CBG: Recent Labs  Lab 02/09/20 0753 02/09/20 1103 02/09/20 1812 02/09/20 2113 02/10/20 0652  GLUCAP 212* 168* 198* 210* 209*   Lipid Profile: No results for input(s): CHOL, HDL, LDLCALC, TRIG, CHOLHDL, LDLDIRECT in the last 72 hours. Thyroid Function Tests: No results for input(s): TSH, T4TOTAL, FREET4, T3FREE, THYROIDAB in the  last 72 hours. Anemia Panel: No results for input(s): VITAMINB12, FOLATE, FERRITIN, TIBC, IRON, RETICCTPCT in the last 72 hours. Sepsis Labs: No results for input(s): PROCALCITON, LATICACIDVEN in the last 168 hours.  Recent Results (from the past 240 hour(s))  Culture, blood (routine x 2)     Status: Abnormal   Collection Time: 02/01/20  9:20 AM   Specimen: BLOOD LEFT ARM  Result Value Ref Range Status   Specimen Description BLOOD LEFT ARM  Final   Special Requests   Final    BOTTLES DRAWN AEROBIC AND ANAEROBIC Blood Culture adequate volume   Culture  Setup Time   Final    IN BOTH AEROBIC AND ANAEROBIC BOTTLES GRAM POSITIVE COCCI IN CLUSTERS CRITICAL RESULT CALLED TO, READ BACK BY AND VERIFIED WITH: Diona Browner PHARMD 02/01/20 2244 JDW Performed at [facility], [place] [address], [place], [place] [address]    Culture STAPHYLOCOCCUS AUREUS (A)  Final   Report Status 02/04/2020 FINAL  Final   Organism ID, Bacteria STAPHYLOCOCCUS AUREUS  Final      Susceptibility   Staphylococcus aureus - MIC*    CIPROFLOXACIN <=0.5 SENSITIVE Sensitive     ERYTHROMYCIN <=0.25 SENSITIVE Sensitive     GENTAMICIN <=0.5 SENSITIVE Sensitive     OXACILLIN <=0.25 SENSITIVE Sensitive     TETRACYCLINE <=1 SENSITIVE Sensitive     VANCOMYCIN 1 SENSITIVE Sensitive     TRIMETH/SULFA <=10 SENSITIVE Sensitive     CLINDAMYCIN <=0.25 SENSITIVE Sensitive     RIFAMPIN <=0.5 SENSITIVE Sensitive     Inducible Clindamycin NEGATIVE Sensitive     * STAPHYLOCOCCUS AUREUS  Culture, blood (routine x 2)     Status: Abnormal   Collection Time: 02/01/20  9:30 AM   Specimen: BLOOD LEFT HAND  Result Value Ref Range Status   Specimen Description BLOOD LEFT HAND  Final   Special Requests   Final    BOTTLES DRAWN AEROBIC ONLY Blood Culture  results may not be optimal due to an inadequate volume of blood received in culture bottles   Culture  Setup Time   Final    AEROBIC BOTTLE ONLY GRAM POSITIVE COCCI IN CLUSTERS Organism ID to  follow CRITICAL RESULT CALLED TO, READ BACK BY AND VERIFIED WITH: V BRYK PHARMD 02/02/20 0213 JDW    Culture (A)  Final    STAPHYLOCOCCUS AUREUS SUSCEPTIBILITIES PERFORMED ON PREVIOUS CULTURE WITHIN THE LAST 5 DAYS. Performed at [facility], [place] [address], [place], [place] [address]    Report Status 02/04/2020 FINAL  Final  Blood Culture ID Panel (Reflexed)     Status: Abnormal   Collection Time: 02/01/20  9:30 AM  Result Value Ref Range Status   Enterococcus species NOT DETECTED NOT DETECTED Final   Listeria monocytogenes NOT DETECTED NOT DETECTED Final   Staphylococcus species DETECTED (A) NOT DETECTED Final    Comment: CRITICAL RESULT CALLED TO, READ BACK BY AND VERIFIED WITH: V BRYK PHARMD 02/02/20 0213 JDW    Staphylococcus aureus (BCID) DETECTED (A) NOT DETECTED Final    Comment: Methicillin (oxacillin) susceptible Staphylococcus aureus (MSSA). Preferred therapy is anti staphylococcal beta lactam antibiotic (Cefazolin or Nafcillin), unless clinically contraindicated. CRITICAL RESULT CALLED TO, READ BACK BY AND VERIFIED WITH: V BRYK PHARMD 02/02/20 0213 JDW    Methicillin resistance NOT DETECTED NOT DETECTED Final   Streptococcus species NOT DETECTED NOT DETECTED Final   Streptococcus agalactiae NOT DETECTED NOT DETECTED Final   Streptococcus pneumoniae NOT DETECTED NOT DETECTED Final   Streptococcus pyogenes NOT DETECTED NOT DETECTED Final   Acinetobacter baumannii NOT DETECTED NOT DETECTED Final   Enterobacteriaceae species NOT DETECTED NOT DETECTED Final   Enterobacter cloacae complex NOT DETECTED NOT DETECTED Final   Escherichia coli NOT DETECTED NOT DETECTED Final   Klebsiella oxytoca NOT DETECTED NOT DETECTED Final   Klebsiella pneumoniae NOT DETECTED NOT DETECTED Final   Proteus species NOT DETECTED NOT DETECTED Final   Serratia marcescens NOT DETECTED NOT DETECTED Final   Haemophilus influenzae NOT DETECTED NOT DETECTED Final   Neisseria meningitidis NOT DETECTED  NOT DETECTED Final   Pseudomonas aeruginosa NOT DETECTED NOT DETECTED Final   Candida albicans NOT DETECTED NOT DETECTED Final   Candida glabrata NOT DETECTED NOT DETECTED Final   Candida krusei NOT DETECTED NOT DETECTED Final   Candida parapsilosis NOT DETECTED NOT DETECTED Final   Candida tropicalis NOT DETECTED NOT DETECTED Final    Comment: Performed at [facility], [place] [address], [place], [place] [address]  SARS CORONAVIRUS 2 (TAT 6-24 HRS) Nasopharyngeal Nasopharyngeal Swab     Status: None   Collection Time: 02/01/20 10:26 AM   Specimen: Nasopharyngeal Swab  Result Value Ref Range Status   SARS Coronavirus 2 NEGATIVE NEGATIVE Final    Comment: (NOTE) SARS-CoV-2 target nucleic acids are NOT DETECTED. The SARS-CoV-2 RNA is generally detectable in upper and lower respiratory specimens during the acute phase of infection. Negative results do not preclude SARS-CoV-2 infection, do not rule out co-infections with other pathogens, and should not be used as the sole basis for treatment or other patient management decisions. Negative results must be combined with clinical observations, patient history, and epidemiological information. The expected result is Negative. Fact Sheet for Patients: SugarRoll.be Fact Sheet for Healthcare Providers: https://www.woods-mathews.com/ This test is not yet approved or cleared by the [place] FDA and  has been authorized for detection and/or diagnosis of SARS-CoV-2 by FDA under an Emergency Use Authorization (EUA). This EUA  will remain  in effect (meaning this test can be used) for the duration of the COVID-19 declaration under Section 56 4(b)(1) of the Act, 21 U.S.C. section 360bbb-3(b)(1), unless the authorization is terminated or revoked sooner. Performed at [facility], [place] [address], [place], [place] [address]   Urine culture     Status: Abnormal   Collection Time: 02/01/20  3:51 PM    Specimen: In/Out Cath Urine  Result Value Ref Range Status   Specimen Description IN/OUT CATH URINE  Final   Special Requests   Final    NONE Performed at [facility], [place] [address] [place], [place] [address]    Culture 50,000 COLONIES/mL STAPHYLOCOCCUS AUREUS (A)  Final   Report Status 02/03/2020 FINAL  Final   Organism ID, Bacteria STAPHYLOCOCCUS AUREUS (A)  Final      Susceptibility   Staphylococcus aureus - MIC*    CIPROFLOXACIN <=0.5 SENSITIVE Sensitive     GENTAMICIN <=0.5 SENSITIVE Sensitive     NITROFURANTOIN <=16 SENSITIVE Sensitive     OXACILLIN <=0.25 SENSITIVE Sensitive     TETRACYCLINE <=1 SENSITIVE Sensitive     VANCOMYCIN 1 SENSITIVE Sensitive     TRIMETH/SULFA <=10 SENSITIVE Sensitive     CLINDAMYCIN <=0.25 SENSITIVE Sensitive     RIFAMPIN <=0.5 SENSITIVE Sensitive     Inducible Clindamycin NEGATIVE Sensitive     * 50,000 COLONIES/mL STAPHYLOCOCCUS AUREUS  MRSA PCR Screening     Status: None   Collection Time: 02/02/20  9:37 AM   Specimen: Nasal Mucosa; Nasopharyngeal  Result Value Ref Range Status   MRSA by PCR NEGATIVE NEGATIVE Final    Comment:        The GeneXpert MRSA Assay (FDA approved for NASAL specimens only), is one component of a comprehensive MRSA colonization surveillance program. It is not intended to diagnose MRSA infection nor to guide or monitor treatment for MRSA infections. Performed at [facility], [place] [address] [place], [place] [address]   Surgical pcr screen     Status: Abnormal   Collection Time: 02/02/20 10:41 PM   Specimen: Nasal Mucosa; Nasal Swab  Result Value Ref Range Status   MRSA, PCR NEGATIVE NEGATIVE Final   Staphylococcus aureus POSITIVE (A) NEGATIVE Final    Comment: (NOTE) The Xpert SA Assay (FDA approved for NASAL specimens in patients 5 years of age and older), is one component of a comprehensive surveillance program. It is not intended to diagnose infection nor to guide or monitor  treatment. Performed at [facility], [place] [address], [place], [place] [address]   Culture, blood (routine x 2)     Status: None   Collection Time: 02/03/20  2:24 PM   Specimen: BLOOD  Result Value Ref Range Status   Specimen Description BLOOD LEFT ARM  Final   Special Requests   Final    BOTTLES DRAWN AEROBIC AND ANAEROBIC Blood Culture adequate volume   Culture   Final    NO GROWTH 5 DAYS Performed at [facility], [address] [place], [place] [address]    Report Status 02/08/2020 FINAL  Final  Culture, blood (routine x 2)     Status: None   Collection Time: 02/03/20  2:26 PM   Specimen: BLOOD  Result Value Ref Range Status   Specimen Description BLOOD LEFT HAND  Final   Special Requests   Final    BOTTLES DRAWN AEROBIC AND ANAEROBIC Blood Culture adequate volume   Culture  Final    NO GROWTH 5 DAYS Performed at [facility], [place] [address] [place], [place] [address]    Report Status 02/08/2020 FINAL  Final      Radiology Studies: No results found.   LOS: 9 days   Time spent: More than 50% of that time was spent in counseling and/or coordination of care.  Antonieta Pert, MD [facility]  02/10/2020, 7:49 AM

## 2020-02-10 NOTE — Plan of Care (Signed)
  Problem: Health Behavior/Discharge Planning: Goal: Ability to manage health-related needs will improve Outcome: Progressing   

## 2020-02-10 NOTE — Progress Notes (Signed)
Patient ID: Chris Bowman, male   DOB: 12-12-1960, 59 y.o.   MRN: [id number]          [facility] Disease    Date of Admission:  02/01/2020   Day 10 cefazolin         Mr. Decatur had a right diabetic foot infection with osteomyelitis complicated by MSSA bacteremia and cervical epidural abscess. He was afebrile overnight. Repeat spine MRI is pending.         Cliffton Asters, MD [facility] [phone] pager   [phone] cell 02/10/2020, 1:18 PM

## 2020-02-11 ENCOUNTER — Inpatient Hospital Stay (HOSPITAL_COMMUNITY): Payer: Non-veteran care

## 2020-02-11 LAB — CBC WITH DIFFERENTIAL/PLATELET
Abs Immature Granulocytes: 0.08 10*3/uL — ABNORMAL HIGH (ref 0.00–0.07)
Basophils Absolute: 0.1 10*3/uL (ref 0.0–0.1)
Basophils Relative: 1 %
Eosinophils Absolute: 0.3 10*3/uL (ref 0.0–0.5)
Eosinophils Relative: 3 %
HCT: 28.2 % — ABNORMAL LOW (ref 39.0–52.0)
Hemoglobin: 8.8 g/dL — ABNORMAL LOW (ref 13.0–17.0)
Immature Granulocytes: 1 %
Lymphocytes Relative: 17 %
Lymphs Abs: 2.1 10*3/uL (ref 0.7–4.0)
MCH: 22.5 pg — ABNORMAL LOW (ref 26.0–34.0)
MCHC: 31.2 g/dL (ref 30.0–36.0)
MCV: 72.1 fL — ABNORMAL LOW (ref 80.0–100.0)
Monocytes Absolute: 1 10*3/uL (ref 0.1–1.0)
Monocytes Relative: 8 %
Neutro Abs: 9.1 10*3/uL — ABNORMAL HIGH (ref 1.7–7.7)
Neutrophils Relative %: 70 %
Platelets: 422 10*3/uL — ABNORMAL HIGH (ref 150–400)
RBC: 3.91 MIL/uL — ABNORMAL LOW (ref 4.22–5.81)
RDW: 15.2 % (ref 11.5–15.5)
WBC: 12.6 10*3/uL — ABNORMAL HIGH (ref 4.0–10.5)
nRBC: 0 % (ref 0.0–0.2)

## 2020-02-11 LAB — GLUCOSE, CAPILLARY
Glucose-Capillary: 188 mg/dL — ABNORMAL HIGH (ref 70–99)
Glucose-Capillary: 213 mg/dL — ABNORMAL HIGH (ref 70–99)
Glucose-Capillary: 188 mg/dL — ABNORMAL HIGH (ref 70–99)
Glucose-Capillary: 200 mg/dL — ABNORMAL HIGH (ref 70–99)

## 2020-02-11 IMAGING — MR MR CERVICAL SPINE WO/W CM
10 of 21 series · 18 of 48 positions shown · IV contrast (gadavist)
Comparison: MRI of the cervical spine without contrast [DATE].

CLINICAL DATA: Question epidural abscess. Recent falls and
weakness. Hyperglycemia. MSSA bacteremia. Osteomyelitis of the foot.
Previous abnormal MRI of the cervical spine with possible epidural
abscess.

EXAM:
MRI CERVICAL AND LUMBAR SPINE WITHOUT AND WITH CONTRAST
TECHNIQUE: Multiplanar and multiecho pulse sequences of the cervical spine, to
include the craniocervical junction and cervicothoracic junction,
and lumbar spine, were obtained without and with intravenous
contrast.
CONTRAST:  8mL GADAVIST GADOBUTROL 1 MMOL/ML IV SOLN

[Series 3: T2 · sagittal · 3.0mm · 0.43mm/px · 1 of 15 slices shown (1 of 5)]
[im 1/15]
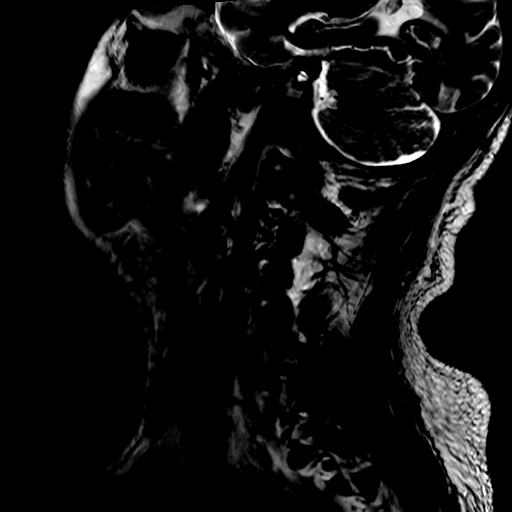

[Series 8: T2 · axial · 3.0mm · 0.35mm/px · z∈[-146,-47]mm · 3 of 33 slices shown (2 of 5)]
[im 1/33]
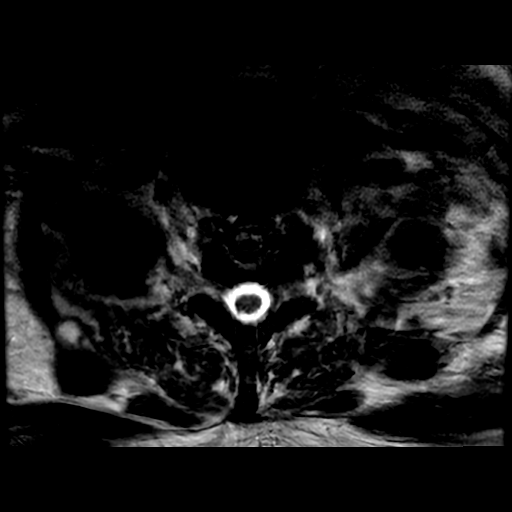
[im 17/33]
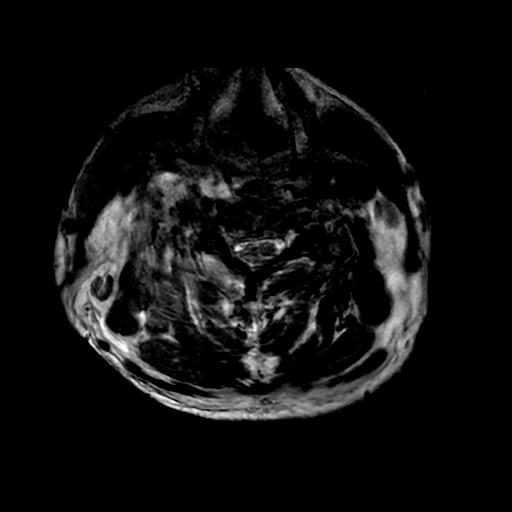
[im 33/33]
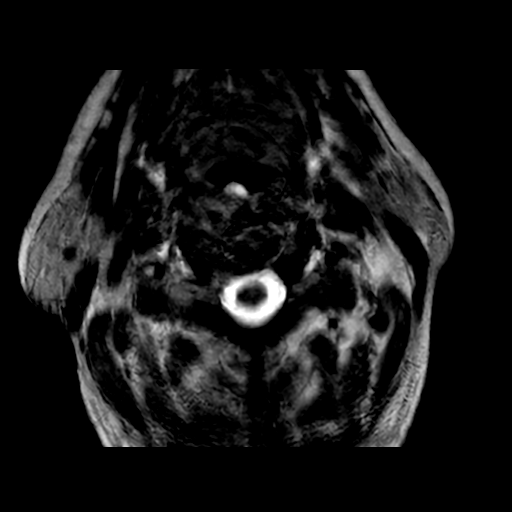

[Series 9: T1 · sagittal · 3.0mm · 0.90mm/px · 1 of 12 slices shown (1 of 5)]
[im 1/12]
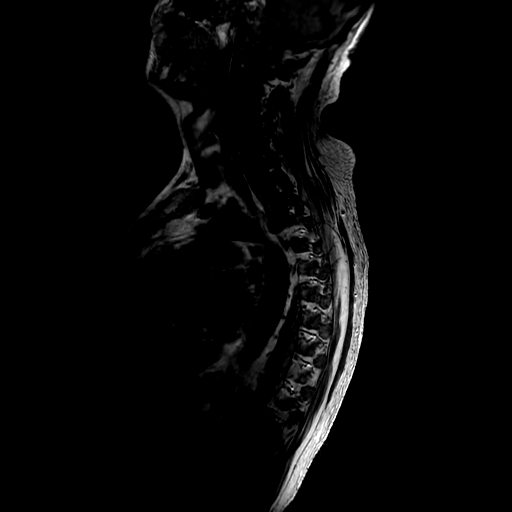

[Series 11: T1 · axial · non-contrast · 3.0mm · 0.35mm/px · z∈[-146,-47]mm · 3 of 33 slices shown (2 of 5)]
[im 1/33]
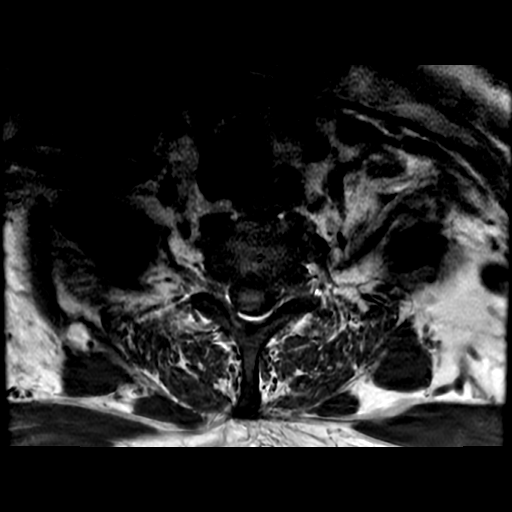
[im 17/33]
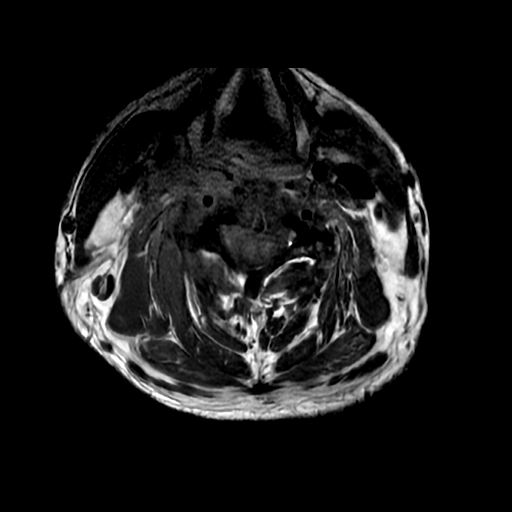
[im 33/33]
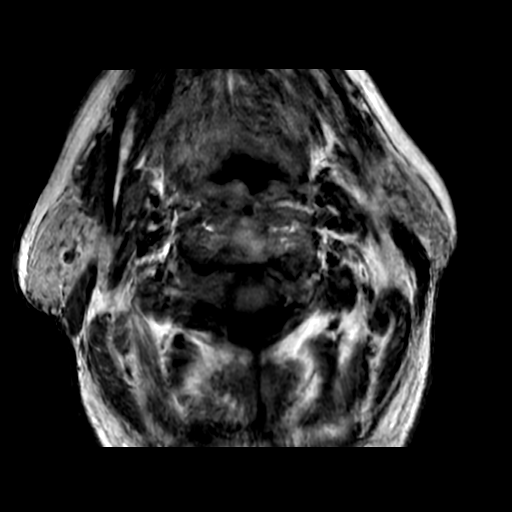

[Series 12: T2 · sagittal · 3.0mm · 0.62mm/px · 1 of 17 slices shown (3 of 5)]
[im 1/17]
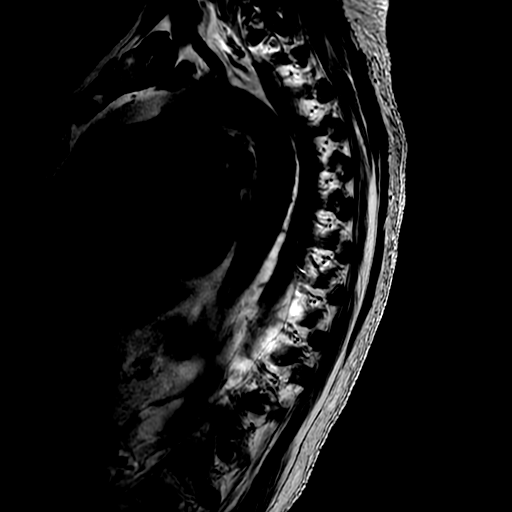

[Series 14: T1 · sagittal · 3.0mm · 0.62mm/px · 1 of 17 slices shown (3 of 5)]
[im 1/17]
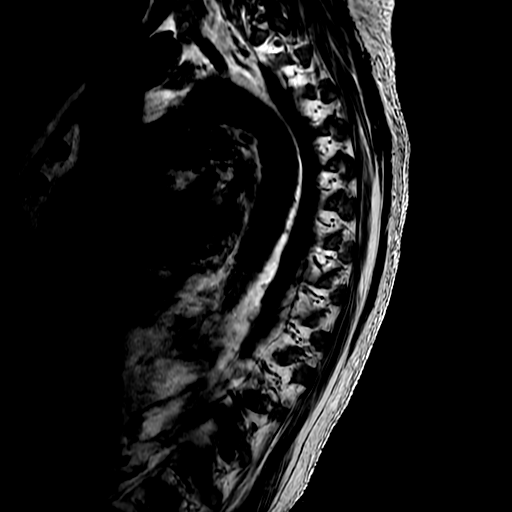

[Series 15: T2 · axial · 4.0mm · 0.39mm/px · z∈[-251,-130]mm · 2 of 25 slices shown (4 of 5)]
[im 1/25]
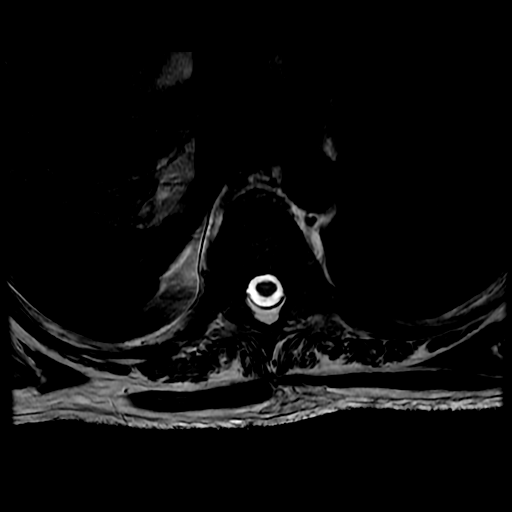
[im 25/25]
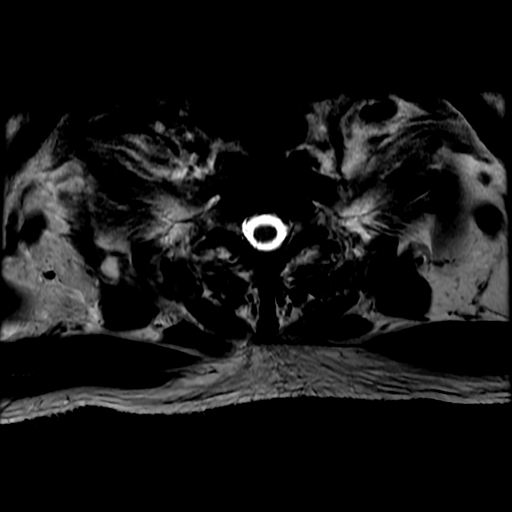

[Series 16: T2 · axial · 4.0mm · 0.39mm/px · z∈[-378,-224]mm · 2 of 29 slices shown (5 of 5)]
[im 1/29]
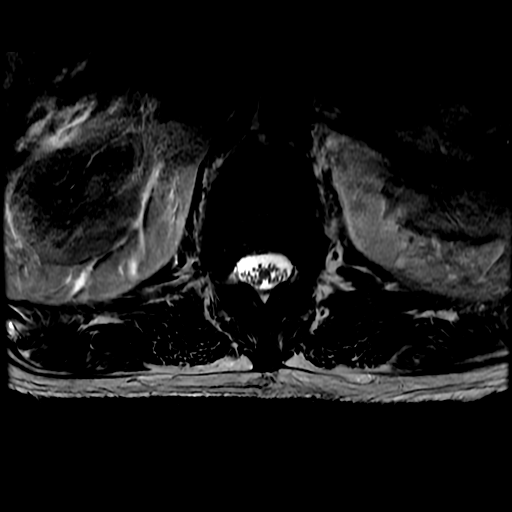
[im 29/29]
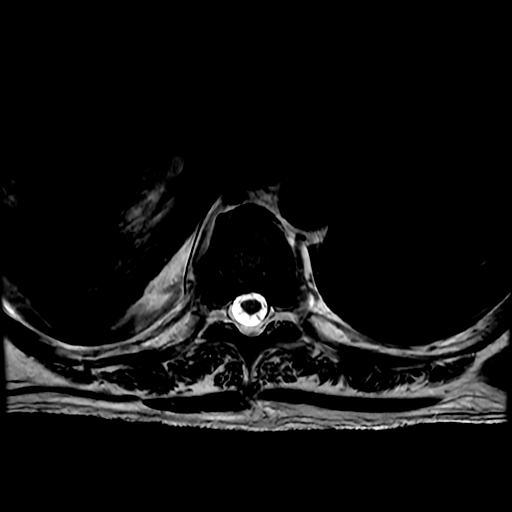

[Series 19: T1 · axial · non-contrast · 3.0mm · 0.39mm/px · z∈[-252,-130]mm · 3 of 39 slices shown (4 of 5)]
[im 1/39]
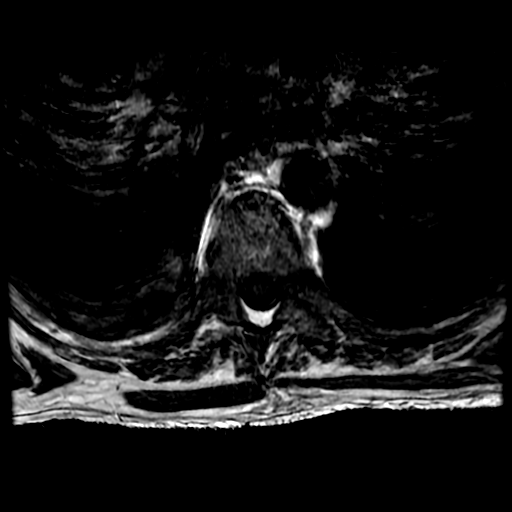
[im 20/39]
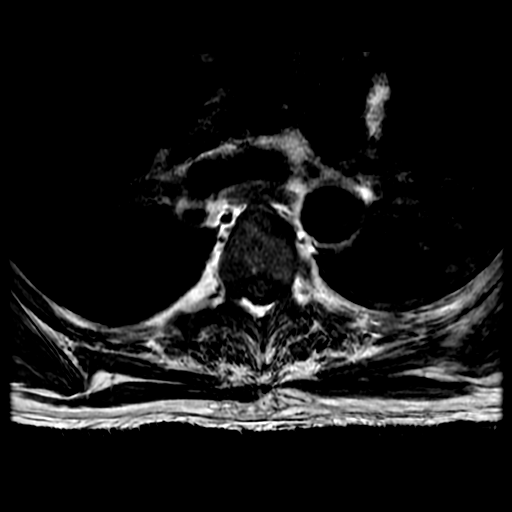
[im 39/39]
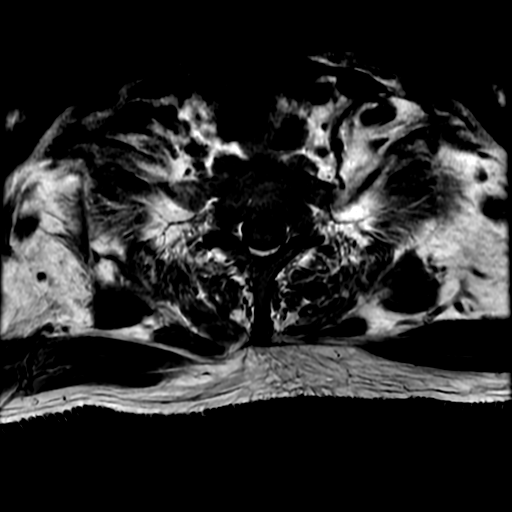

[Series 20: T1 · axial · non-contrast · 3.0mm · 0.39mm/px · 1 of 46 slices shown (5 of 5)]
[im 1/46]
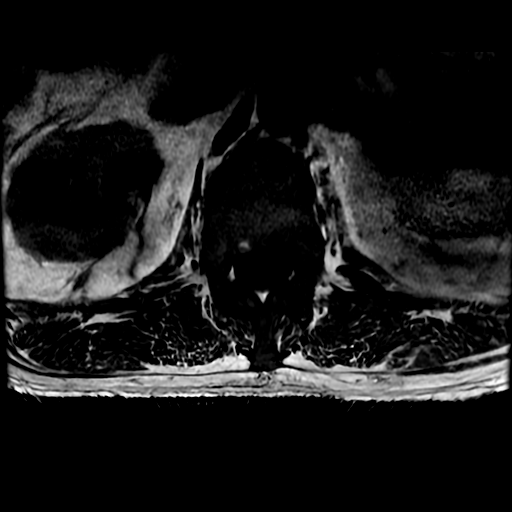

[18 of 48 positions shown; findings below may reference images not displayed]

FINDINGS: MRI CERVICAL SPINE FINDINGS

Alignment: Alignment is anatomic

Vertebrae: Edema and enhancement is present at C4-5, most prominent
on the right and posteriorly. Vertebral body heights are maintained.
More subtle changes are present on the right at C6-7.

Cord: Normal signal is present in the cervical and upper thoracic
spinal cord. Cord morphology is preserved.

Posterior Fossa, vertebral arteries, paraspinal tissues:
Craniocervical junction is normal. Flow is present in the vertebral
arteries bilaterally. Visualized intracranial contents are normal.

Disc levels:

Previously noted ventral epidural collection on the right has
expanded. There is diffuse enhancement associated with the
collection. Collection is centered at C4-5 but extends superiorly to
the C2 level. The collection extends inferiorly to C5-6. Abnormal
enhancement extends to the C1-2 level on the right into C7
inferiorly. Abnormal signal and enhancement is present within the
right neural foramina at C3-4, C4-5, and C5-6. Abnormal signal and
enhancement is present about the C5-6 facet joint on the right.
Dorsal epidural enhancement is present at C3-4 and C4-5.

MRI THORACIC SPINE FINDINGS

Alignment:  No significant listhesis is present.

Vertebrae: Marrow signal is diffusely depressed. No discrete lesions
are present. No pathologic enhancement is present.

Conus medullaris and cauda equina: Conus extends to the L1 level.
Conus and cauda equina appear normal.

Paraspinal and other soft tissues: Paraspinous soft tissues are
within normal limits. No enhancing soft tissue is present.
Visualized lung fields are clear.

Disc levels:

No focal disc protrusion or stenosis is present. The central canal
and foramina are patent throughout the thoracic spine.
IMPRESSION: 1. Progressive enhancing epidural collection in the cervical spine
centered at C4-5. Abnormal disc signal, endplate signal, and
enhancement suggest disc osteomyelitis at this level.
2. Abnormal epidural fluid enhancement extending superiorly to C2-3
and inferiorly to C5.
3. Additional dural enhancement extends superiorly to the C1-2
level.
4. Subtle signal changes may represent early discitis at C6-7.
5. Cervical paraspinal disease on the right with enhancement of
paraspinous soft tissues including paraspinous musculature
consistent with diffuse infection.
6. Abnormal signal and enhancement within the right cervical
foramina at C3-4, C4-5, and C5-6.

## 2020-02-11 IMAGING — MR MR THORACIC SPINE WO/W CM
10 of 21 series · 18 of 48 positions shown · IV contrast (gadavist)
Comparison: MRI of the cervical spine without contrast [DATE].

CLINICAL DATA: Question epidural abscess. Recent falls and
weakness. Hyperglycemia. MSSA bacteremia. Osteomyelitis of the foot.
Previous abnormal MRI of the cervical spine with possible epidural
abscess.

EXAM:
MRI CERVICAL AND LUMBAR SPINE WITHOUT AND WITH CONTRAST
TECHNIQUE: Multiplanar and multiecho pulse sequences of the cervical spine, to
include the craniocervical junction and cervicothoracic junction,
and lumbar spine, were obtained without and with intravenous
contrast.
CONTRAST:  8mL GADAVIST GADOBUTROL 1 MMOL/ML IV SOLN

[Series 3: T2 · sagittal · 3.0mm · 0.43mm/px · 1 of 15 slices shown (1 of 5)]
[im 1/15]
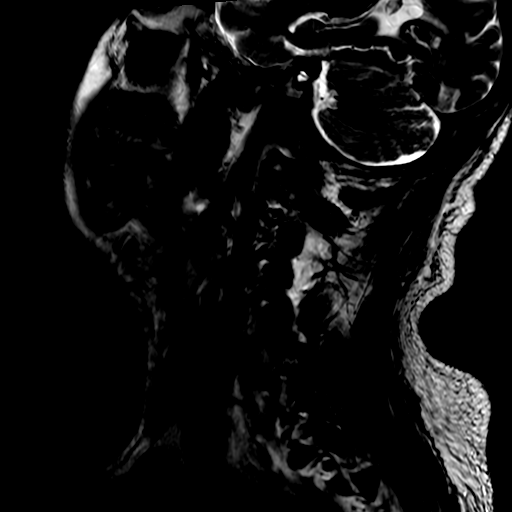

[Series 8: T2 · axial · 3.0mm · 0.35mm/px · z∈[-146,-47]mm · 3 of 33 slices shown (2 of 5)]
[im 1/33]
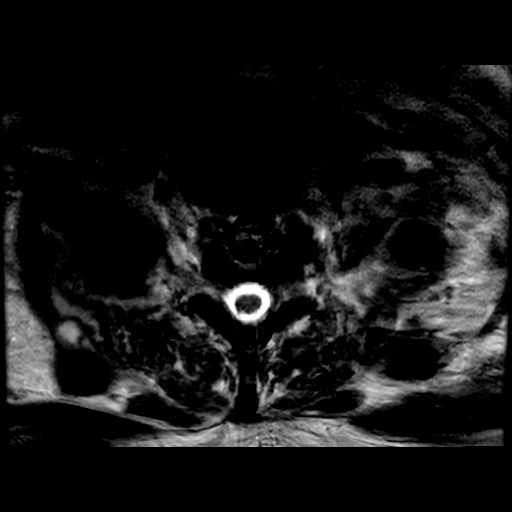
[im 17/33]
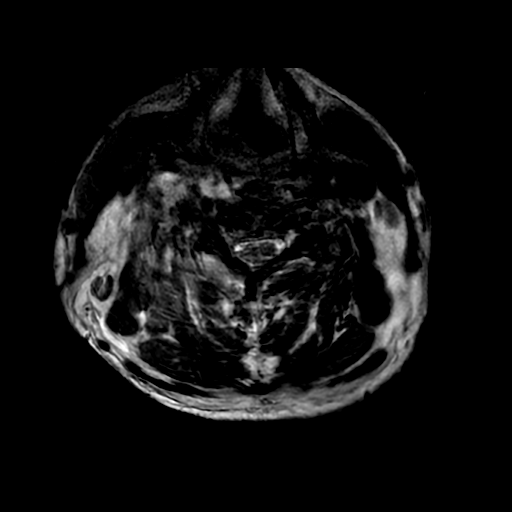
[im 33/33]
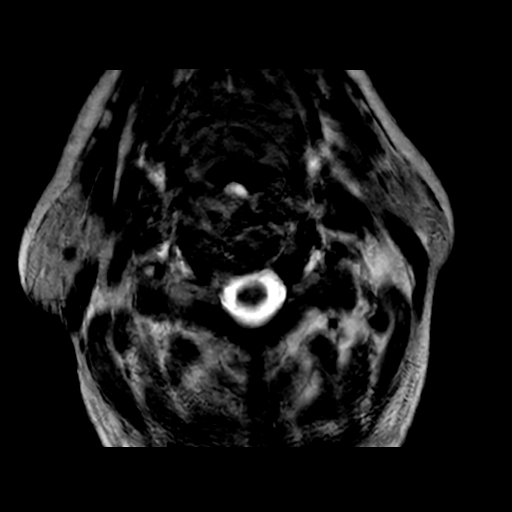

[Series 9: T1 · sagittal · 3.0mm · 0.90mm/px · 1 of 12 slices shown (1 of 5)]
[im 1/12]
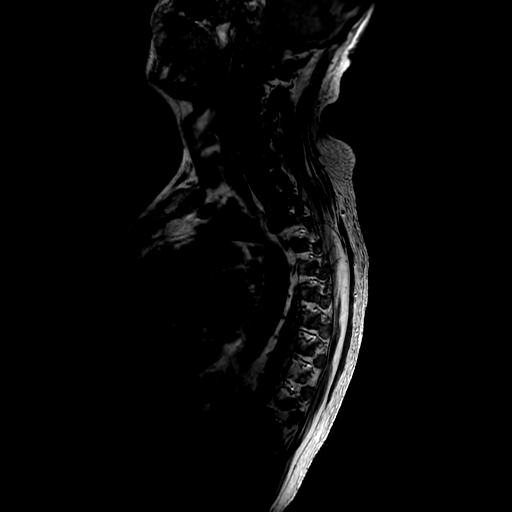

[Series 11: T1 · axial · non-contrast · 3.0mm · 0.35mm/px · z∈[-146,-47]mm · 3 of 33 slices shown (2 of 5)]
[im 1/33]
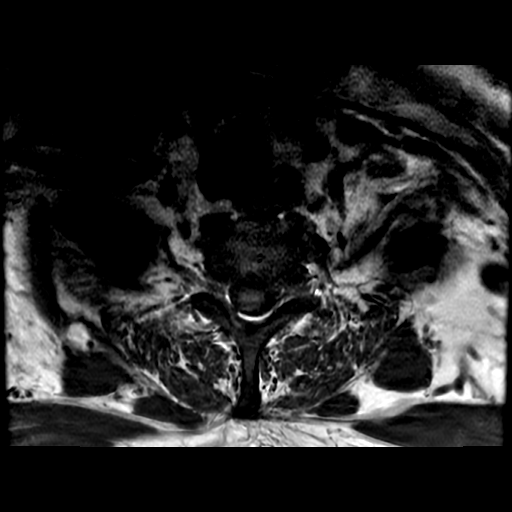
[im 17/33]
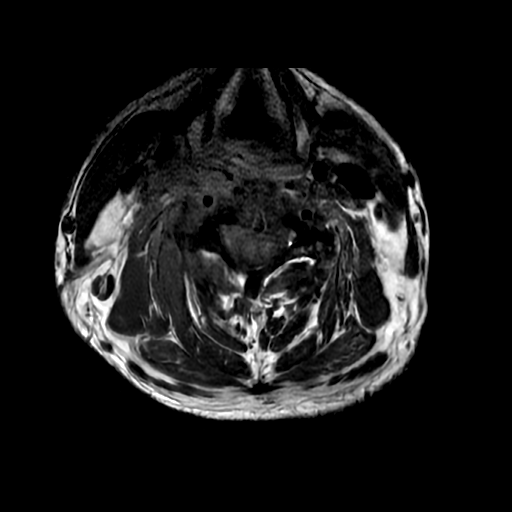
[im 33/33]
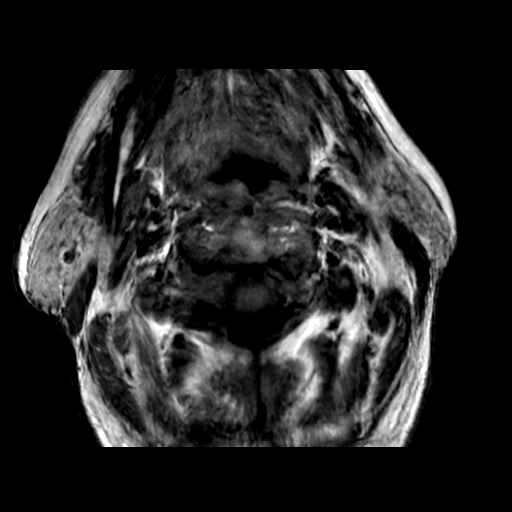

[Series 12: T2 · sagittal · 3.0mm · 0.62mm/px · 1 of 17 slices shown (3 of 5)]
[im 1/17]
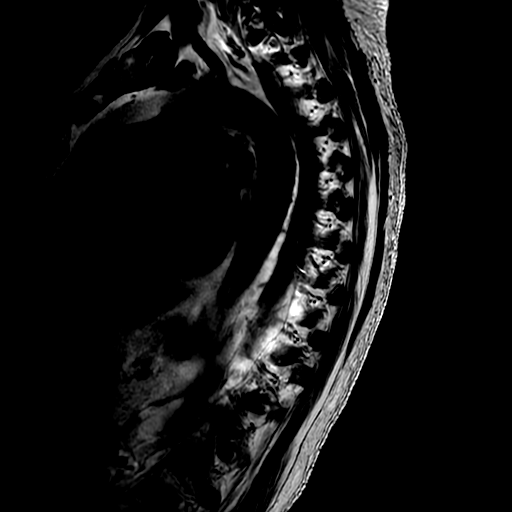

[Series 14: T1 · sagittal · 3.0mm · 0.62mm/px · 1 of 17 slices shown (3 of 5)]
[im 1/17]
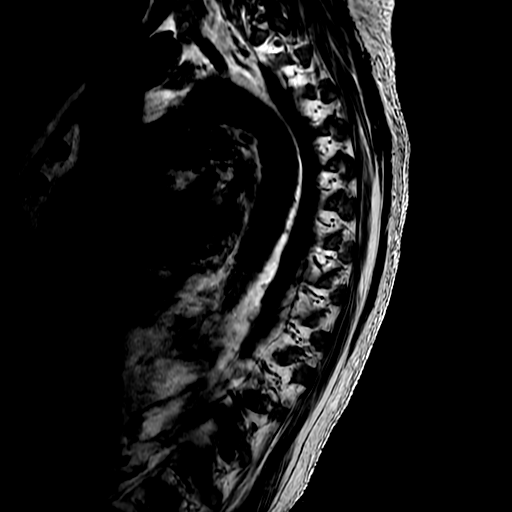

[Series 15: T2 · axial · 4.0mm · 0.39mm/px · z∈[-251,-130]mm · 2 of 25 slices shown (4 of 5)]
[im 1/25]
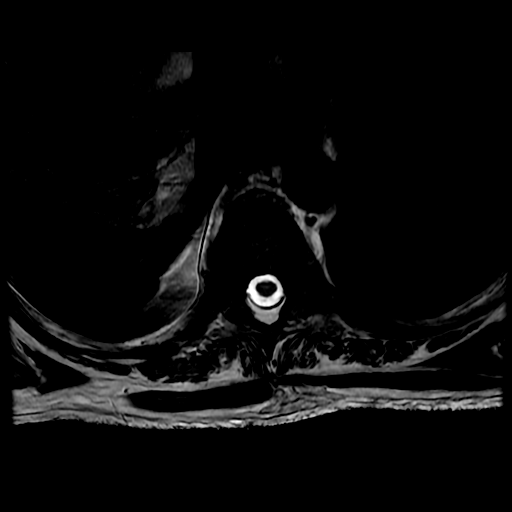
[im 25/25]
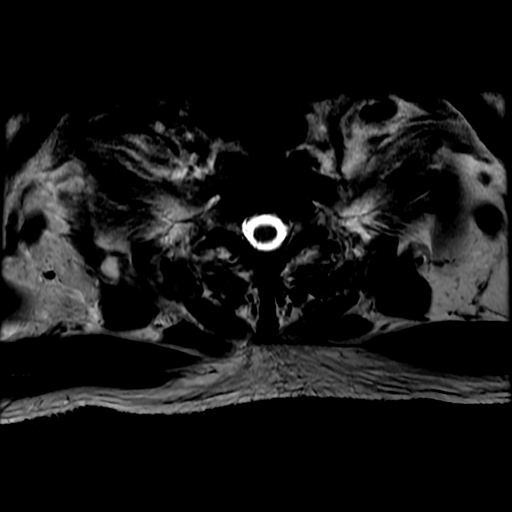

[Series 16: T2 · axial · 4.0mm · 0.39mm/px · z∈[-378,-224]mm · 2 of 29 slices shown (5 of 5)]
[im 1/29]
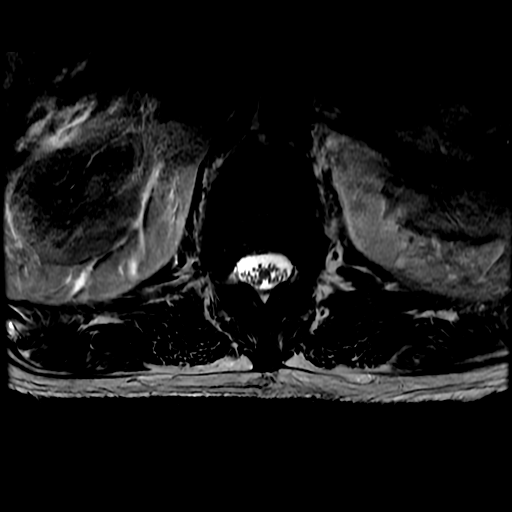
[im 29/29]
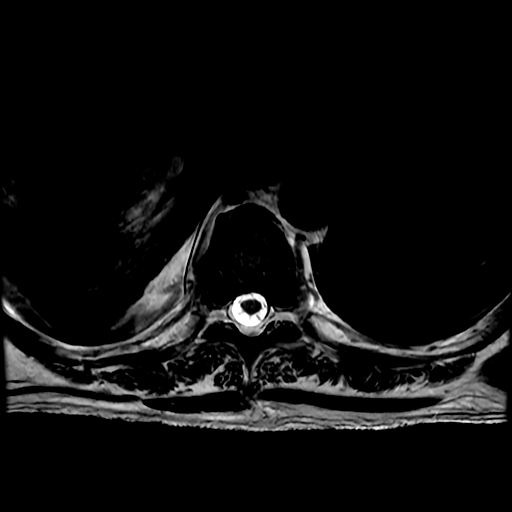

[Series 19: T1 · axial · non-contrast · 3.0mm · 0.39mm/px · z∈[-252,-130]mm · 3 of 39 slices shown (4 of 5)]
[im 1/39]
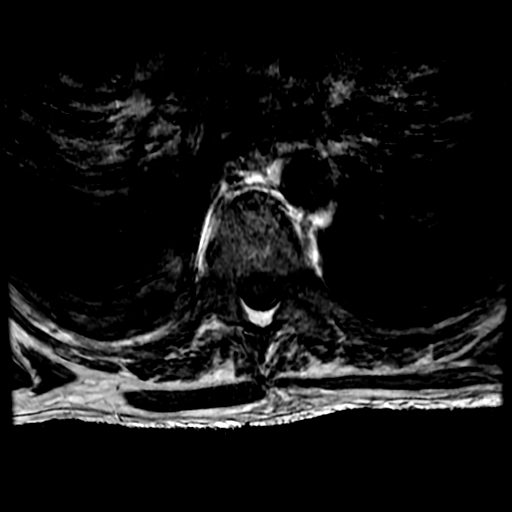
[im 20/39]
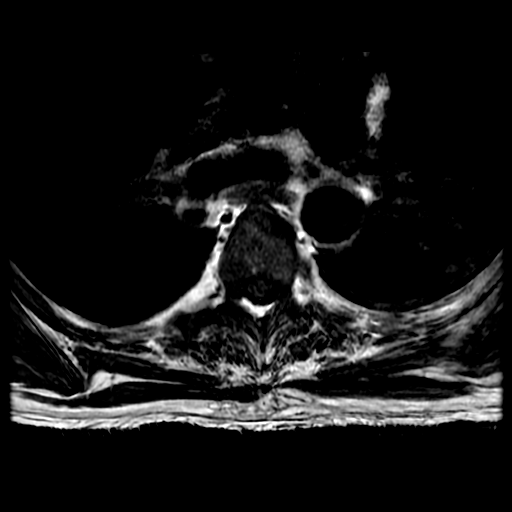
[im 39/39]
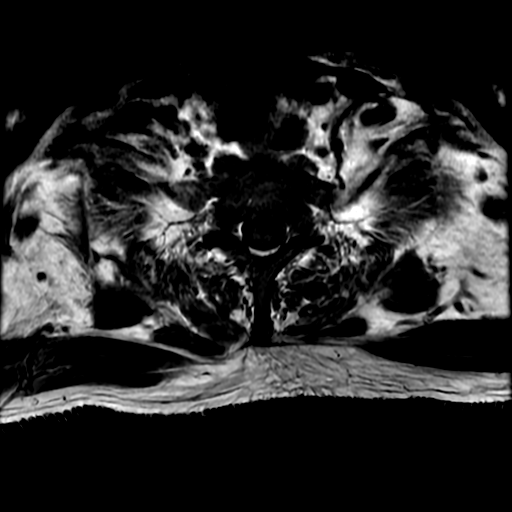

[Series 20: T1 · axial · non-contrast · 3.0mm · 0.39mm/px · 1 of 46 slices shown (5 of 5)]
[im 1/46]
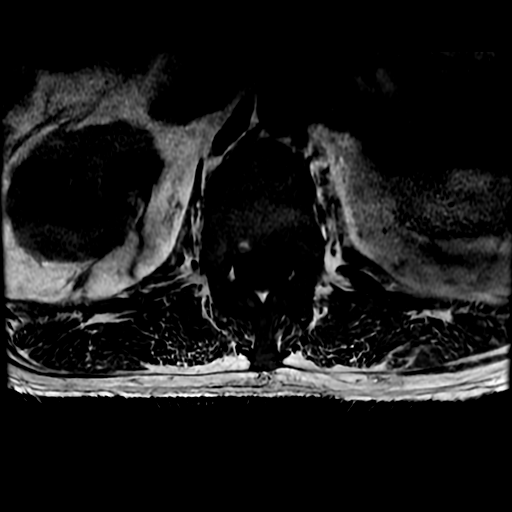

[18 of 48 positions shown; findings below may reference images not displayed]

FINDINGS: MRI CERVICAL SPINE FINDINGS

Alignment: Alignment is anatomic

Vertebrae: Edema and enhancement is present at C4-5, most prominent
on the right and posteriorly. Vertebral body heights are maintained.
More subtle changes are present on the right at C6-7.

Cord: Normal signal is present in the cervical and upper thoracic
spinal cord. Cord morphology is preserved.

Posterior Fossa, vertebral arteries, paraspinal tissues:
Craniocervical junction is normal. Flow is present in the vertebral
arteries bilaterally. Visualized intracranial contents are normal.

Disc levels:

Previously noted ventral epidural collection on the right has
expanded. There is diffuse enhancement associated with the
collection. Collection is centered at C4-5 but extends superiorly to
the C2 level. The collection extends inferiorly to C5-6. Abnormal
enhancement extends to the C1-2 level on the right into C7
inferiorly. Abnormal signal and enhancement is present within the
right neural foramina at C3-4, C4-5, and C5-6. Abnormal signal and
enhancement is present about the C5-6 facet joint on the right.
Dorsal epidural enhancement is present at C3-4 and C4-5.

MRI THORACIC SPINE FINDINGS

Alignment:  No significant listhesis is present.

Vertebrae: Marrow signal is diffusely depressed. No discrete lesions
are present. No pathologic enhancement is present.

Conus medullaris and cauda equina: Conus extends to the L1 level.
Conus and cauda equina appear normal.

Paraspinal and other soft tissues: Paraspinous soft tissues are
within normal limits. No enhancing soft tissue is present.
Visualized lung fields are clear.

Disc levels:

No focal disc protrusion or stenosis is present. The central canal
and foramina are patent throughout the thoracic spine.
IMPRESSION: 1. Progressive enhancing epidural collection in the cervical spine
centered at C4-5. Abnormal disc signal, endplate signal, and
enhancement suggest disc osteomyelitis at this level.
2. Abnormal epidural fluid enhancement extending superiorly to C2-3
and inferiorly to C5.
3. Additional dural enhancement extends superiorly to the C1-2
level.
4. Subtle signal changes may represent early discitis at C6-7.
5. Cervical paraspinal disease on the right with enhancement of
paraspinous soft tissues including paraspinous musculature
consistent with diffuse infection.
6. Abnormal signal and enhancement within the right cervical
foramina at C3-4, C4-5, and C5-6.

## 2020-02-11 MED ORDER — GADOBUTROL 1 MMOL/ML IV SOLN
8.0000 mL | Freq: Once | INTRAVENOUS | Status: AC | PRN
  Administered 2020-02-11: 8 mL via INTRAVENOUS

## 2020-02-11 NOTE — Progress Notes (Signed)
PROGRESS NOTE    Chris Bowman  GYB:[id number] DOB: 1961/07/26 DOA: 02/01/2020 PCP: Patient, No Pcp Per   Brief Narrative: 59 year old male with UNknown medical history, who has not seen a doctor since thousand 18 presented to the ER on 3/4 with weakness and falls x2 weeks and associated fever dyspnea cough, chronic buttock pain since the fall along with limitation of range of motion, right arm pain.  Apparently diagnosed with diabetes in thousand and but did not think he needed medicine for it, reported history of taking too many baby aspirin and having stomach pain" I have a little incision is scar from that" in the ED afebrile Covid negative WBC 20.4K glucose 300 A1c 11 sodium 120,patient was admitted under hospitalist.Work-up showed MSSA UTI, bacteremia bilateral foot osteomyelitis with associated sepsis and concern for cervical spine epidural abscess/phlegmon C3-C5 no cord compression, no evidence of discitis or osteomyelitis.Patient was seen by neurosurgery, infectious disease.Patient underwent TEE-negative for endocarditis, MRI spine discitis/osteo-/small phlegmon, is a status post I&D of the left toe, 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  Patient is on Ancef as per ID and will need 6 weeks of IV antibiotic, repeat blood culture on 3/6 and PICC line 3/10.On 3/8 urine retention needing in and out cath and subsequently needing Foley catheter. 3/11 temp spike 101 and 3/10 100.5. ID reconsulted and Blood cx/MRI c-t spine ordered.  Subjective: Patient denies any new complaint.  No neck pain no neck stiffness. Again low-grade fever spike 100.9 yesterday evening, blood pressure stable. On RA.  WBC has worsened 12.9K  Assessment & Plan:  MSSA sepsis due to Rt Diabetic foot infection/osteomyelitis-s/p Rt TMA/MSSA UTI/cervical C3-C5  Fluid collection: Seen by ID, orthopedics, neurosurgery. S/p TEE negative for endocarditis, normal EF with tiny PFO with right to  left shunt, status post I&D of ulcer on the left great toe, s/p TMA ray amputation of right foot with wound VAC placement 3/6-wound VAC out 3/11 and on healing well on daily dry dressing. Neurosurgery recommends 6 weeks to 3 months and repeat MRI C-spine with and without contrast in 6 weeks or sooner if progressive myelopathy develops if develops worse back pain will need MRI lumbar spine with and without contrast. He needs follow-up with ID clinic in 4 weeks and to cont w/ 6 wk of Ancef per ID.  Having intermittent fever, blood culture sent 3/12 Negative so far, wbc increasing slightly- MRI cervical and thoracic spine ordered to evaluate the previous phlegmon collection and pending.  Nursing reports he will be going for MRI this morning. He has ativn prn pre mri.  Multiple bowel movements, stopped laxatives, addedas needed Imodium.  Its not loose.  Scalene Myositis: Prevertebral edema with right-sided paravertebral fluid collections in the mid to lower cervical spine concerning for infection and abscesses with suspected myositis involving the right-sided scalene musculature noted on MRI. Case discussed with ENT Dr. Wilburn Cornelia who recommend antibiotic treatment,no surgical intervention needed  Urine retention continue supportive care.Placed on indwelling Foley- placed 3/8 when he had 700 ml urine on bladder scan. Voiding trial once more ambulatory.  AKI secondary to ATN in the setting of sepsis.No obstruction in the ultrasound.  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  Monitor renal function closely. Creat at 1.3.  Type 2 diabetes mellitus with hyperglycemia, without long-term current use of insulin: hba1c uncontrolled at 11.3.  Started on Levemir and changed to insulin 70/30,. Appreciate diabetic coordinator follow-up. monitor and adjust insulin.Sugar fairly controlled.  Recent Labs  Lab 02/10/20 0652 02/10/20 1147 02/10/20 1708 02/10/20 2038 02/11/20 0649  GLUCAP 209* 176* 287* 204* 188*    Ulcer of left foot due to type 2 diabetes mellitus cont wound care  Hyponatremia: Likely from hypovolemia. Sodium improving 129. Monitor  Generalized weakness/frequent fall : Continue PT OT and will need a skilled nursing facility placement.  SW working on it. Discussed with Education officer, museum.  Seen by neurology sent neuropathy,  B1 B6 level, which are WNL, MMA slightly up at 403,.  Transaminitis with ALP elevation. AST/ALT also elevated.171/102->102/78- overall stable. Hepatitis panel negative.  Likely from sepsis.  Ultrasound shows cholelithiasis but no obstruction. Monitor.  Microcytic anemia B12 normal folate normal TSH normal iron panel, with elevated ferritin 543, iron low at 12, TIBC low at 174.  Delusional disorder/impaired memory MRI brain showed mild cerebral atrophy and chronic small vessel ischemia.  Currently patient is alert awake oriented, follows commands.  Has some tangential thought process.  HIV screen negative. he reports he is feeling much more clear and able to think better.  He directs towards his sister for any kind of decision.  Nutrition: Nutrition Problem: Increased nutrient needs Etiology: wound healing Signs/Symptoms: estimated needs Interventions: Glucerna shake, MVI Body mass index is 30.1 kg/m.   DVT prophylaxis:SCD Code Status:full Family Communication: plan of care discussed with patient at bedside.updated sister over the phone. She is working on placement- vs home w/ h, working with Development worker, community. Disposition Plan: Patient is from:Home Anticipated Disposition: to skilled nursing facility Barriers to discharge or conditions that needs to be met prior to discharge: Patient admitted with MSSA sepsis foot osteomyelitis, cervical osteo/discitis, remains hospitalized for IV antibiotics.  Having intermittent fever-further MRI C and T-spine planned.Plan for SNF  for IV antibiotics x6 weeks.  Consultants:ID, neurology, cardiology for TEE, neurosurgery,  orthopedics  Procedures:see note Microbiology: Urine culture MSSA, blood culture MSSA, repeat BLOOD CX 3/6 - so far   Medications: Scheduled Meds: . Chlorhexidine Gluconate Cloth  6 each Topical Daily  . feeding supplement (GLUCERNA SHAKE)  237 mL Oral TID BM  . feeding supplement (PRO-STAT SUGAR FREE 64)  30 mL Oral BID  . insulin aspart  0-15 Units Subcutaneous TID WC  . insulin aspart  0-5 Units Subcutaneous QHS  . insulin aspart protamine- aspart  8 Units Subcutaneous BID WC  . insulin starter kit- pen needles  1 kit Other Once  . multivitamin with minerals  1 tablet Oral Daily  . sodium chloride flush  3 mL Intravenous Q12H  . tamsulosin  0.4 mg Oral Daily  . vitamin B-12  1,000 mcg Oral Daily   Continuous Infusions: . sodium chloride 10 mL/hr at 02/09/20 1017  .  ceFAZolin (ANCEF) IV 2 g (02/11/20 0527)  . lactated ringers Stopped (02/03/20 1146)  . methocarbamol (ROBAXIN) IV      Antimicrobials: Anti-infectives (From admission, onward)   Start     Dose/Rate Route Frequency Ordered Stop   02/03/20 1130  ceFAZolin (ANCEF) IVPB 1 g/50 mL premix  Status:  Discontinued     1 g 100 mL/hr over 30 Minutes Intravenous Every 6 hours 02/03/20 1124 02/03/20 1139   02/02/20 1100  vancomycin (VANCOREADY) IVPB 750 mg/150 mL  Status:  Discontinued     750 mg 150 mL/hr over 60 Minutes Intravenous Every 24 hours 02/01/20 1218 02/02/20 0221   02/02/20 0600  ceFAZolin (ANCEF) IVPB 2g/100 mL premix     2 g 200 mL/hr over 30 Minutes Intravenous Every  8 hours 02/02/20 0221     02/01/20 2230  ceFEPIme (MAXIPIME) 2 g in sodium chloride 0.9 % 100 mL IVPB  Status:  Discontinued     2 g 200 mL/hr over 30 Minutes Intravenous Every 12 hours 02/01/20 1218 02/02/20 0221   02/01/20 1200  metroNIDAZOLE (FLAGYL) IVPB 500 mg  Status:  Discontinued     500 mg 100 mL/hr over 60 Minutes Intravenous Every 8 hours 02/01/20 1149 02/02/20 0221   02/01/20 1015  ceFEPIme (MAXIPIME) 2 g in sodium chloride 0.9 %  100 mL IVPB     2 g 200 mL/hr over 30 Minutes Intravenous  Once 02/01/20 1008 02/01/20 1056   02/01/20 1015  vancomycin (VANCOREADY) IVPB 1750 mg/350 mL     1,750 mg 175 mL/hr over 120 Minutes Intravenous  Once 02/01/20 1008 02/01/20 1336     Objective: Vitals: Today's Vitals   02/10/20 1711 02/10/20 2038 02/10/20 2112 02/11/20 0541  BP: 121/61 116/65  (!) 142/90  Pulse: 91 92  84  Resp: 16 16  15   Temp: (!) 100.9 F (38.3 C) 98.7 F (37.1 C)  98.6 F (37 C)  TempSrc: Oral Oral  Oral  SpO2: 95% 94%  96%  Weight:  89.8 kg    Height:      PainSc:   0-No pain     Intake/Output Summary (Last 24 hours) at 02/11/2020 0754 Last data filed at 02/11/2020 0541 Gross per 24 hour  Intake 1682.25 ml  Output 2600 ml  Net -917.75 ml   Filed Weights   02/04/20 2107 02/09/20 2101 02/10/20 2038  Weight: 92.3 kg 89.8 kg 89.8 kg   Weight change: 0.005 kg   Intake/Output from previous day: 03/13 0701 - 03/14 0700 In: 1682.3 [P.O.:860; I.V.:522.3; IV Piggyback:300] Out: 2600 [Urine:2600] Intake/Output this shift: No intake/output data recorded.  Examination:  General exam: Alert awake oriented at baseline, forgetful, not in acute distress. HEENT:Oral mucosa moist, Ear/Nose WNL grossly, dentition normal. Respiratory system: bilaterally clear,no wheezing or crackles,no use of accessory muscle Cardiovascular system: S1 & S2 +, No JVD,. Gastrointestinal system: Abdomen soft, NT,ND, BS+ Nervous System:Alert, awake, moving extremities and grossly nonfocal Extremities: No edema, distal peripheral pulses palpable. RT foot w dressing intact.  Left grt toe base w wound healing- dressing+ Skin: No rashes,no icterus. MSK: Normal muscle bulk,tone, power  Data Reviewed: I have personally reviewed following labs and imaging studies. CBC: Recent Labs  Lab 02/07/20 0347 02/08/20 0430 02/09/20 0458 02/10/20 0404 02/11/20 0437  WBC 11.6* 10.7* 9.7 11.1* 12.6*  NEUTROABS 8.6* 8.1* 6.3 8.1*  9.1*  HGB 9.3* 8.8* 9.2* 9.0* 8.8*  HCT 29.0* 28.1* 29.3* 28.9* 28.2*  MCV 69.9* 71.1* 71.8* 71.7* 72.1*  PLT 440* 457* 469* 447* 831*   Basic Metabolic Panel: Recent Labs  Lab 02/05/20 0500 02/05/20 0500 02/06/20 0421 02/07/20 0347 02/08/20 0430 02/09/20 0458 02/10/20 0404  NA 125*   < > 127* 127* 129* 131* 131*  K 4.7   < > 5.0 4.7 4.5 4.5 4.2  CL 92*   < > 93* 91* 93* 96* 97*  CO2 24   < > 23 25 24 23 24   GLUCOSE 264*   < > 154* 178* 188* 205* 191*  BUN 24*   < > 24* 26* 27* 31* 32*  CREATININE 1.54*   < > 1.37* 1.35* 1.36* 1.27* 1.21  CALCIUM 8.2*   < > 8.1* 8.3* 8.1* 8.5* 8.5*  MG 2.0  --   --   --   --   --   --    < > =  values in this interval not displayed.   GFR: Estimated Creatinine Clearance: 73.4 mL/min (by C-G formula based on SCr of 1.21 mg/dL). Liver Function Tests: Recent Labs  Lab 02/06/20 0421 02/07/20 0347 02/08/20 0430 02/09/20 0458 02/10/20 0404  AST 145* 212* 171* 102* 56*  ALT 62* 100* 102* 78* 50*  ALKPHOS 229* 241* 256* 246* 208*  BILITOT 0.2* 0.4 0.3 0.2* 0.3  PROT 6.3* 6.3* 6.2* 6.5 6.5  ALBUMIN 1.6* 1.6* 1.7* 1.8* 1.8*   No results for input(s): LIPASE, AMYLASE in the last 168 hours. No results for input(s): AMMONIA in the last 168 hours. Coagulation Profile: No results for input(s): INR, PROTIME in the last 168 hours. Cardiac Enzymes: No results for input(s): CKTOTAL, CKMB, CKMBINDEX, TROPONINI in the last 168 hours. BNP (last 3 results) No results for input(s): PROBNP in the last 8760 hours. HbA1C: No results for input(s): HGBA1C in the last 72 hours. CBG: Recent Labs  Lab 02/10/20 0652 02/10/20 1147 02/10/20 1708 02/10/20 2038 02/11/20 0649  GLUCAP 209* 176* 287* 204* 188*   Lipid Profile: No results for input(s): CHOL, HDL, LDLCALC, TRIG, CHOLHDL, LDLDIRECT in the last 72 hours. Thyroid Function Tests: No results for input(s): TSH, T4TOTAL, FREET4, T3FREE, THYROIDAB in the last 72 hours. Anemia Panel: No results for  input(s): VITAMINB12, FOLATE, FERRITIN, TIBC, IRON, RETICCTPCT in the last 72 hours. Sepsis Labs: No results for input(s): PROCALCITON, LATICACIDVEN in the last 168 hours.  Recent Results (from the past 240 hour(s))  Culture, blood (routine x 2)     Status: Abnormal   Collection Time: 02/01/20  9:20 AM   Specimen: BLOOD LEFT ARM  Result Value Ref Range Status   Specimen Description BLOOD LEFT ARM  Final   Special Requests   Final    BOTTLES DRAWN AEROBIC AND ANAEROBIC Blood Culture adequate volume   Culture  Setup Time   Final    IN BOTH AEROBIC AND ANAEROBIC BOTTLES GRAM POSITIVE COCCI IN CLUSTERS CRITICAL RESULT CALLED TO, READ BACK BY AND VERIFIED WITH: Diona Browner PHARMD 02/01/20 2244 JDW Performed at [facility], [place] [address] [place], [place] [address]    Culture STAPHYLOCOCCUS AUREUS (A)  Final   Report Status 02/04/2020 FINAL  Final   Organism ID, Bacteria STAPHYLOCOCCUS AUREUS  Final      Susceptibility   Staphylococcus aureus - MIC*    CIPROFLOXACIN <=0.5 SENSITIVE Sensitive     ERYTHROMYCIN <=0.25 SENSITIVE Sensitive     GENTAMICIN <=0.5 SENSITIVE Sensitive     OXACILLIN <=0.25 SENSITIVE Sensitive     TETRACYCLINE <=1 SENSITIVE Sensitive     VANCOMYCIN 1 SENSITIVE Sensitive     TRIMETH/SULFA <=10 SENSITIVE Sensitive     CLINDAMYCIN <=0.25 SENSITIVE Sensitive     RIFAMPIN <=0.5 SENSITIVE Sensitive     Inducible Clindamycin NEGATIVE Sensitive     * STAPHYLOCOCCUS AUREUS  Culture, blood (routine x 2)     Status: Abnormal   Collection Time: 02/01/20  9:30 AM   Specimen: BLOOD LEFT HAND  Result Value Ref Range Status   Specimen Description BLOOD LEFT HAND  Final   Special Requests   Final    BOTTLES DRAWN AEROBIC ONLY Blood Culture results may not be optimal due to an inadequate volume of blood received in culture bottles   Culture  Setup Time   Final    AEROBIC BOTTLE ONLY GRAM POSITIVE COCCI IN CLUSTERS Organism ID to follow CRITICAL RESULT CALLED TO, READ BACK BY  AND VERIFIED WITH: V  BRYK PHARMD 02/02/20 0213 JDW    Culture (A)  Final    STAPHYLOCOCCUS AUREUS SUSCEPTIBILITIES PERFORMED ON PREVIOUS CULTURE WITHIN THE LAST 5 DAYS. Performed at [facility], [place] [address] [place], [place] [address]    Report Status 02/04/2020 FINAL  Final  Blood Culture ID Panel (Reflexed)     Status: Abnormal   Collection Time: 02/01/20  9:30 AM  Result Value Ref Range Status   Enterococcus species NOT DETECTED NOT DETECTED Final   Listeria monocytogenes NOT DETECTED NOT DETECTED Final   Staphylococcus species DETECTED (A) NOT DETECTED Final    Comment: CRITICAL RESULT CALLED TO, READ BACK BY AND VERIFIED WITH: V BRYK PHARMD 02/02/20 0213 JDW    Staphylococcus aureus (BCID) DETECTED (A) NOT DETECTED Final    Comment: Methicillin (oxacillin) susceptible Staphylococcus aureus (MSSA). Preferred therapy is anti staphylococcal beta lactam antibiotic (Cefazolin or Nafcillin), unless clinically contraindicated. CRITICAL RESULT CALLED TO, READ BACK BY AND VERIFIED WITH: V BRYK PHARMD 02/02/20 0213 JDW    Methicillin resistance NOT DETECTED NOT DETECTED Final   Streptococcus species NOT DETECTED NOT DETECTED Final   Streptococcus agalactiae NOT DETECTED NOT DETECTED Final   Streptococcus pneumoniae NOT DETECTED NOT DETECTED Final   Streptococcus pyogenes NOT DETECTED NOT DETECTED Final   Acinetobacter baumannii NOT DETECTED NOT DETECTED Final   Enterobacteriaceae species NOT DETECTED NOT DETECTED Final   Enterobacter cloacae complex NOT DETECTED NOT DETECTED Final   Escherichia coli NOT DETECTED NOT DETECTED Final   Klebsiella oxytoca NOT DETECTED NOT DETECTED Final   Klebsiella pneumoniae NOT DETECTED NOT DETECTED Final   Proteus species NOT DETECTED NOT DETECTED Final   Serratia marcescens NOT DETECTED NOT DETECTED Final   Haemophilus influenzae NOT DETECTED NOT DETECTED Final   Neisseria meningitidis NOT DETECTED NOT DETECTED Final   Pseudomonas aeruginosa NOT  DETECTED NOT DETECTED Final   Candida albicans NOT DETECTED NOT DETECTED Final   Candida glabrata NOT DETECTED NOT DETECTED Final   Candida krusei NOT DETECTED NOT DETECTED Final   Candida parapsilosis NOT DETECTED NOT DETECTED Final   Candida tropicalis NOT DETECTED NOT DETECTED Final    Comment: Performed at [facility], [place] [address] [place], [place] [address]  SARS CORONAVIRUS 2 (TAT 6-24 HRS) Nasopharyngeal Nasopharyngeal Swab     Status: None   Collection Time: 02/01/20 10:26 AM   Specimen: Nasopharyngeal Swab  Result Value Ref Range Status   SARS Coronavirus 2 NEGATIVE NEGATIVE Final    Comment: (NOTE) SARS-CoV-2 target nucleic acids are NOT DETECTED. The SARS-CoV-2 RNA is generally detectable in upper and lower respiratory specimens during the acute phase of infection. Negative results do not preclude SARS-CoV-2 infection, do not rule out co-infections with other pathogens, and should not be used as the sole basis for treatment or other patient management decisions. Negative results must be combined with clinical observations, patient history, and epidemiological information. The expected result is Negative. Fact Sheet for Patients: SugarRoll.be Fact Sheet for Healthcare Providers: https://www.woods-mathews.com/ This test is not yet approved or cleared by the [place] FDA and  has been authorized for detection and/or diagnosis of SARS-CoV-2 by FDA under an Emergency Use Authorization (EUA). This EUA will remain  in effect (meaning this test can be used) for the duration of the COVID-19 declaration under Section 56 4(b)(1) of the Act, 21 U.S.C. section 360bbb-3(b)(1), unless the authorization is terminated or revoked sooner. Performed at [facility], [place] [address], [place], [place] [address]  Urine culture     Status: Abnormal   Collection Time: 02/01/20  3:51 PM   Specimen: In/Out Cath Urine  Result Value  Ref Range Status   Specimen Description IN/OUT CATH URINE  Final   Special Requests   Final    NONE Performed at [facility], [place] [address] [place], [place] [address]    Culture 50,000 COLONIES/mL STAPHYLOCOCCUS AUREUS (A)  Final   Report Status 02/03/2020 FINAL  Final   Organism ID, Bacteria STAPHYLOCOCCUS AUREUS (A)  Final      Susceptibility   Staphylococcus aureus - MIC*    CIPROFLOXACIN <=0.5 SENSITIVE Sensitive     GENTAMICIN <=0.5 SENSITIVE Sensitive     NITROFURANTOIN <=16 SENSITIVE Sensitive     OXACILLIN <=0.25 SENSITIVE Sensitive     TETRACYCLINE <=1 SENSITIVE Sensitive     VANCOMYCIN 1 SENSITIVE Sensitive     TRIMETH/SULFA <=10 SENSITIVE Sensitive     CLINDAMYCIN <=0.25 SENSITIVE Sensitive     RIFAMPIN <=0.5 SENSITIVE Sensitive     Inducible Clindamycin NEGATIVE Sensitive     * 50,000 COLONIES/mL STAPHYLOCOCCUS AUREUS  MRSA PCR Screening     Status: None   Collection Time: 02/02/20  9:37 AM   Specimen: Nasal Mucosa; Nasopharyngeal  Result Value Ref Range Status   MRSA by PCR NEGATIVE NEGATIVE Final    Comment:        The GeneXpert MRSA Assay (FDA approved for NASAL specimens only), is one component of a comprehensive MRSA colonization surveillance program. It is not intended to diagnose MRSA infection nor to guide or monitor treatment for MRSA infections. Performed at [facility], [place] [address] [place], [place] [address]   Surgical pcr screen     Status: Abnormal   Collection Time: 02/02/20 10:41 PM   Specimen: Nasal Mucosa; Nasal Swab  Result Value Ref Range Status   MRSA, PCR NEGATIVE NEGATIVE Final   Staphylococcus aureus POSITIVE (A) NEGATIVE Final    Comment: (NOTE) The Xpert SA Assay (FDA approved for NASAL specimens in patients 71 years of age and older), is one component of a comprehensive surveillance program. It is not intended to diagnose infection nor to guide or monitor treatment. Performed at [facility], [place] [address], [place], [place] [address]   Culture, blood (routine x 2)     Status: None   Collection Time: 02/03/20  2:24 PM   Specimen: BLOOD  Result Value Ref Range Status   Specimen Description BLOOD LEFT ARM  Final   Special Requests   Final    BOTTLES DRAWN AEROBIC AND ANAEROBIC Blood Culture adequate volume   Culture   Final    NO GROWTH 5 DAYS Performed at [facility], [address], [place], [place] [address]    Report Status 02/08/2020 FINAL  Final  Culture, blood (routine x 2)     Status: None   Collection Time: 02/03/20  2:26 PM   Specimen: BLOOD  Result Value Ref Range Status   Specimen Description BLOOD LEFT HAND  Final   Special Requests   Final    BOTTLES DRAWN AEROBIC AND ANAEROBIC Blood Culture adequate volume   Culture   Final    NO GROWTH 5 DAYS Performed at [facility], [place] [address] [place], [place] [address]    Report Status 02/08/2020 FINAL  Final  Culture, blood (routine x 2)     Status: None (Preliminary result)   Collection Time: 02/09/20  1:38 PM   Specimen: BLOOD  Result Value Ref Range Status   Specimen Description BLOOD LEFT ANTECUBITAL  Final   Special Requests AEROBIC BOTTLE ONLY Blood Culture adequate volume  Final   Culture   Final    NO GROWTH < 24 HOURS Performed at [facility], [place] [address] [place], [place] [address]    Report Status PENDING  Incomplete  Culture, blood (routine x 2)     Status: None (Preliminary result)   Collection Time: 02/09/20  1:39 PM   Specimen: BLOOD LEFT HAND  Result Value Ref Range Status   Specimen Description BLOOD LEFT HAND  Final   Special Requests   Final    BOTTLES DRAWN AEROBIC AND ANAEROBIC Blood Culture adequate volume   Culture   Final    NO GROWTH < 24 HOURS Performed at [facility], [place] [address], [place], [place] [address]    Report Status PENDING  Incomplete      Radiology Studies: No results found.   LOS: 10 days   Time spent: More than 50% of that time  was spent in counseling and/or coordination of care.  Antonieta Pert, MD [facility]  02/11/2020, 7:54 AM

## 2020-02-11 NOTE — Progress Notes (Signed)
  NEUROSURGERY PROGRESS NOTE   Received call from Dr Jonathon Bellows regarding patient. 59 year old currently admitted with known cervical discitis/osteomyelitis. Repeat MRI was ordered today due to intermittent fevers. By report, patient is without neck pain & remains neurologically intact.   MRI from today was reviewed and compared to MRI from 3/4. There is progressive epidural phlegmon that now extends from C2-3 to C5, now early disciitis at C6-7. There remains no high grade cental canal stenosis. No indication for NS intervention at present. Continue abx per ID.  Will let Dr Maisie Fus know about the repeat MRI since he previously saw the patient.

## 2020-02-11 NOTE — Progress Notes (Signed)
Spoke with Neurosurgeon on call re MRI C and T spine:no acute intervention needed.He will inform Dr Maisie Fus for follow up in am.

## 2020-02-11 NOTE — Plan of Care (Signed)
  Problem: Health Behavior/Discharge Planning: Goal: Ability to manage health-related needs will improve Outcome: Progressing   

## 2020-02-12 DIAGNOSIS — Z89429 Acquired absence of other toe(s), unspecified side: Secondary | ICD-10-CM

## 2020-02-12 DIAGNOSIS — Z95828 Presence of other vascular implants and grafts: Secondary | ICD-10-CM

## 2020-02-12 DIAGNOSIS — G061 Intraspinal abscess and granuloma: Secondary | ICD-10-CM

## 2020-02-12 LAB — CBC WITH DIFFERENTIAL/PLATELET
Abs Immature Granulocytes: 0.06 10*3/uL (ref 0.00–0.07)
Basophils Absolute: 0.1 10*3/uL (ref 0.0–0.1)
Basophils Relative: 1 %
Eosinophils Absolute: 0.4 10*3/uL (ref 0.0–0.5)
Eosinophils Relative: 3 %
HCT: 29.7 % — ABNORMAL LOW (ref 39.0–52.0)
Hemoglobin: 9.1 g/dL — ABNORMAL LOW (ref 13.0–17.0)
Immature Granulocytes: 1 %
Lymphocytes Relative: 23 %
Lymphs Abs: 2.5 10*3/uL (ref 0.7–4.0)
MCH: 22.4 pg — ABNORMAL LOW (ref 26.0–34.0)
MCHC: 30.6 g/dL (ref 30.0–36.0)
MCV: 73 fL — ABNORMAL LOW (ref 80.0–100.0)
Monocytes Absolute: 0.8 10*3/uL (ref 0.1–1.0)
Monocytes Relative: 8 %
Neutro Abs: 6.9 10*3/uL (ref 1.7–7.7)
Neutrophils Relative %: 64 %
Platelets: 457 10*3/uL — ABNORMAL HIGH (ref 150–400)
RBC: 4.07 MIL/uL — ABNORMAL LOW (ref 4.22–5.81)
RDW: 15.2 % (ref 11.5–15.5)
WBC: 10.8 10*3/uL — ABNORMAL HIGH (ref 4.0–10.5)
nRBC: 0 % (ref 0.0–0.2)

## 2020-02-12 LAB — GLUCOSE, CAPILLARY
Glucose-Capillary: 210 mg/dL — ABNORMAL HIGH (ref 70–99)
Glucose-Capillary: 154 mg/dL — ABNORMAL HIGH (ref 70–99)
Glucose-Capillary: 175 mg/dL — ABNORMAL HIGH (ref 70–99)
Glucose-Capillary: 188 mg/dL — ABNORMAL HIGH (ref 70–99)

## 2020-02-12 NOTE — Progress Notes (Signed)
[facility]   Reason for visit: Follow up on epidural abscess  Interval History: repeat MRI with some progression of cervical epidural abscess, afebrile > 48 hours, no new complaints.  No associated n/v/d.  No rash.  Day 12 cefazolin   Physical Exam: Constitutional:  Vitals:   02/12/20 0500 02/12/20 0909  BP: 113/62 115/61  Pulse: 82 89  Resp: 16 18  Temp: 98.1 F (36.7 C) 97.9 F (36.6 C)  SpO2: 96% 97%   patient appears in NAD Eyes: anicteric Respiratory: Normal respiratory effort; CTA B Cardiovascular: RRR   Review of Systems: Constitutional: negative for fevers and chills Gastrointestinal: negative for nausea and diarrhea  Lab Results  Component Value Date   WBC 10.8 (H) 02/12/2020   HGB 9.1 (L) 02/12/2020   HCT 29.7 (L) 02/12/2020   MCV 73.0 (L) 02/12/2020   PLT 457 (H) 02/12/2020    Lab Results  Component Value Date   CREATININE 1.21 02/10/2020   BUN 32 (H) 02/10/2020   NA 131 (L) 02/10/2020   K 4.2 02/10/2020   CL 97 (L) 02/10/2020   CO2 24 02/10/2020    Lab Results  Component Value Date   ALT 50 (H) 02/10/2020   AST 56 (H) 02/10/2020   ALKPHOS 208 (H) 02/10/2020     Microbiology: Recent Results (from the past 240 hour(s))  Surgical pcr screen     Status: Abnormal   Collection Time: 02/02/20 10:41 PM   Specimen: Nasal Mucosa; Nasal Swab  Result Value [facility] Status   MRSA, PCR NEGATIVE NEGATIVE Final   Staphylococcus aureus POSITIVE (A) NEGATIVE Final    Comment: (NOTE) The Xpert SA Assay (FDA approved for NASAL specimens in patients 4 years of age and older), is one component of a comprehensive surveillance program. It is not intended to diagnose infection nor to guide or monitor treatment. Performed at [facility], [place] [address] [place], [place] [address]   Culture, blood (routine x 2)     Status: None   Collection Time: 02/03/20  2:24 PM   Specimen: BLOOD  Result Value Ref [facility] Status   Specimen Description BLOOD LEFT ARM  Final   Special Requests   Final    BOTTLES DRAWN AEROBIC AND ANAEROBIC Blood Culture adequate volume   Culture   Final    NO GROWTH 5 DAYS Performed at [facility], [address] [place], [place] [address]    Report Status 02/08/2020 FINAL  Final  Culture, blood (routine x 2)     Status: None   Collection Time: 02/03/20  2:26 PM   Specimen: BLOOD  Result Value Ref Range Status   Specimen Description BLOOD LEFT HAND  Final   Special Requests   Final    BOTTLES DRAWN AEROBIC AND ANAEROBIC Blood Culture adequate volume   Culture   Final    NO GROWTH 5 DAYS Performed at [facility], [place] [address], [place], [place] [address]    Report Status 02/08/2020 FINAL  Final  Culture, blood (routine x 2)     Status: None (Preliminary result)   Collection Time: 02/09/20  1:38 PM   Specimen: BLOOD  Result Value Ref [facility] Status   Specimen Description BLOOD LEFT ANTECUBITAL  Final   Special Requests   Final    AEROBIC BOTTLE ONLY Blood Culture adequate volume Performed at [facility], [place] [address], [place], [place] [address]    Culture  NO GROWTH 3 DAYS  Final   Report Status PENDING  Incomplete  Culture, blood (routine x 2)     Status: None (Preliminary result)   Collection Time: 02/09/20  1:39 PM   Specimen: BLOOD LEFT HAND  Result Value Ref [facility] Status   Specimen Description BLOOD LEFT HAND  Final   Special Requests   Final    BOTTLES DRAWN AEROBIC AND ANAEROBIC Blood Culture adequate volume Performed at [facility], [place] [address] [place], [place] [address]    Culture NO GROWTH 3 DAYS  Final   Report Status PENDING  Incomplete    Impression/Plan:  1. 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  Repeat MRI with no extension into thoracic area. Some expected progression this early in cervical area.  No changes to treatment other than he may need follow up MRI and extension of IV antibiotics vs oral  continuation at the end of the 6 weeks.   Baseline CRP 13.2, ESR 94.   2.  Foot osteomyelitis - s/p TM amputation.  Treated.    3.  Access - has a picc line.

## 2020-02-12 NOTE — TOC Progression Note (Signed)
Transition of Care [facility]) - Progression Note    Patient Details  Name: Boyce Keltner MRN: [id number] Date of Birth: [birth year]  [facility] CM/SW Contact  Okey Dupre Lazaro Arms, LCSW Phone Number: 02/12/2020, 4:39 PM  Clinical Narrative:  Talked by phone with patient's sister [name] [phone]) to determine if she had talked with financial counselor Christia Reading and completed Medicaid and disability applications on his behalf. Ms. [name] reported that she had talked with Shanda Bumps and provided some information. Ms. [name] also indicated that she was waiting to see a Chaplain to have complete the [facility] POA paperwork and have it notarized.  CSW emailed Christia Reading regarding the Medicaid and disability applications for patient.     Expected Discharge Plan: Skilled Nursing Facility Barriers to Discharge: Continued Medical Work up  Expected Discharge Plan and Services Expected Discharge Plan: Skilled Nursing Facility In-house Referral: Clinical Social Work     Living arrangements for the past 2 months: Apartment                                     Social Determinants of Health (SDOH) Interventions    Readmission Risk Interventions No flowsheet data found.

## 2020-02-12 NOTE — Progress Notes (Signed)
Physical Therapy Treatment Patient Details Name: Chris Bowman MRN: [id number] DOB: 01-Jan-1961 Today's Date: 02/12/2020    History of Present Illness 59 y.o. male was admitted with bacteremia and received TEE to rule out endocarditis causing sepsis.  His R foot received transmet amp on 02/03/20 and L great toe wound I and D.  Has vac on R foot now, referred to PT for mobility.  PMHx:  uncontrolled diabetes, essential HTN, delusional disorders. bilateral feet wounds with early indication of osteomyelitis per MRI results from 3/4    PT Comments    Pt was seen for mobility of transfers to get to chair and to avoid WB on RLE.  Pt is NWB on RLE from standing to sit on the recliner, and is more aware of the restrictions with it.  Pt is somewhat anxious about his day if he feels he cannot expect certain events at a certain time, and will see if he can be given a window of time to expect PT to see him practically speaking.  Follow acutely for distance on gait, LE strengthening and control of balance in static and dynamic standing.   Follow Up Recommendations  SNF     Equipment Recommendations  None recommended by PT    Recommendations for Other Services       Precautions / Restrictions Precautions Precautions: Fall;Other (comment) Precaution Comments: picc line Required Braces or Orthoses: Other Brace Other Brace: R post-op shoe Restrictions Weight Bearing Restrictions: Yes RLE Weight Bearing: Non weight bearing LLE Weight Bearing: Weight bearing as tolerated Other Position/Activity Restrictions: ortho shoe R foot    Mobility  Bed Mobility Overal bed mobility: Needs Assistance Bed Mobility: Supine to Sit Rolling: Min guard   Supine to sit: Min assist     General bed mobility comments: min assist to roll and sit up but requires cued help to scoot forward on the bed  Transfers Overall transfer level: Needs assistance Equipment used: Rolling walker (2 wheeled) Transfers: Sit  to/from Stand Sit to Stand: Max assist;From elevated surface Stand pivot transfers: Min assist;From elevated surface       General transfer comment: max initially then min as pt became more comfortable with his technique  Ambulation/Gait         Gait velocity: reduced   General Gait Details: wgt shifting only to transfer to chair   Stairs             Wheelchair Mobility    Modified Rankin (Stroke Patients Only)       Balance Overall balance assessment: Needs assistance Sitting-balance support: Single extremity supported;Bilateral upper extremity supported Sitting balance-Leahy Scale: Good Sitting balance - Comments: pt can shift hips to increase his control of sitting                                    Cognition Arousal/Alertness: Awake/alert Behavior During Therapy: Flat affect Overall Cognitive Status: No family/caregiver present to determine baseline cognitive functioning                                 General Comments: pt is feeling like he needs to know exactly when his events of the day are happening      Exercises      General Comments General comments (skin integrity, edema, etc.): pt was instructed on hand placement and safety with task, able  to maintain NWB on RLE during transition to stand.      Pertinent Vitals/Pain Pain Assessment: No/denies pain    Home Living                      Prior Function            PT Goals (current goals can now be found in the care plan section) Acute Rehab PT Goals Patient Stated Goal: to get home Progress towards PT goals: Progressing toward goals    Frequency    Min 3X/week      PT Plan Current plan remains appropriate    Co-evaluation              AM-PAC PT "6 Clicks" Mobility   Outcome Measure  Help needed turning from your back to your side while in a flat bed without using bedrails?: None Help needed moving from lying on your back to  sitting on the side of a flat bed without using bedrails?: A Little Help needed moving to and from a bed to a chair (including a wheelchair)?: A Little Help needed standing up from a chair using your arms (e.g., wheelchair or bedside chair)?: A Lot Help needed to walk in hospital room?: A Lot Help needed climbing 3-5 steps with a railing? : Total 6 Click Score: 15    End of Session Equipment Utilized During Treatment: Gait belt;Other (comment)(R ortho shoe) Activity Tolerance: Patient tolerated treatment well Patient left: in chair;with call bell/phone within reach;with chair alarm set Nurse Communication: Mobility status PT Visit Diagnosis: Unsteadiness on feet (R26.81);Muscle weakness (generalized) (M62.81);Difficulty in walking, not elsewhere classified (R26.2);Adult, failure to thrive (R62.7);Repeated falls (R29.6);Pain     Time: 8469-6295 PT Time Calculation (min) (ACUTE ONLY): 29 min  Charges:  $Therapeutic Activity: 23-37 mins                    JESIEL GARATE 02/12/2020, 2:23 PM  Samul Dada, PT MS Acute Rehab Dept. Number: [facility] [phone] and [facility] [phone]

## 2020-02-12 NOTE — Progress Notes (Signed)
PHARMACY CONSULT NOTE FOR:  OUTPATIENT  PARENTERAL ANTIBIOTIC THERAPY (OPAT)  Indication: MSSA bacteremia and diabetic foot osteo Regimen: cefazolin 2 gm IV q8h End date: 03/16/2020  IV antibiotic discharge orders are pended. To discharging provider:  please sign these orders via discharge navigator,  Select New Orders & click on the button choice - Manage This Unsigned Work.     Thank you for allowing pharmacy to be a part of this patient's care.   Sharin Mons, PharmD, BCPS, BCIDP Infectious Diseases Clinical Pharmacist Phone: [phone] Please check AMION for all [facility] phone numbers After 10:00 PM, call Main Pharmacy [phone]  02/12/2020, 11:42 AM

## 2020-02-12 NOTE — Progress Notes (Signed)
PROGRESS NOTE    Chris Bowman  PQZ:[id number] DOB: 06-29-1961 DOA: 02/01/2020 PCP: Patient, No Pcp Per   Brief Narrative: 59 year old male with UNknown medical history, who has not seen a doctor since thousand 18 presented to the ER on 3/4 with weakness and falls x2 weeks and associated fever dyspnea cough, chronic buttock pain since the fall along with limitation of range of motion, right arm pain.  Apparently diagnosed with diabetes in thousand and but did not think he needed medicine for it, reported history of taking too many baby aspirin and having stomach pain" I have a little incision is scar from that" in the ED afebrile Covid negative WBC 20.4K glucose 300 A1c 11 sodium 120,patient was admitted under hospitalist.Work-up showed MSSA UTI, bacteremia bilateral foot osteomyelitis with associated sepsis and concern for cervical spine epidural abscess/phlegmon C3-C5 no cord compression, no evidence of discitis or osteomyelitis.Patient was seen by neurosurgery, infectious disease.Patient underwent TEE-negative for endocarditis, MRI spine discitis/osteo-/small phlegmon, is a status post I&D of the left toe, 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  Patient is on Ancef as per ID and will need 6 weeks of IV antibiotic, repeat blood culture on 3/6 and PICC line 3/10.On 3/8 urine retention needing in and out cath and subsequently needing Foley catheter. 3/11 temp spike 101 and 3/10 100.5. ID reconsulted and Blood cx/MRI c-t spine ordered.  Subjective: Seen this morning.  Alert awake oriented, mildly forgetful. Asking about the results of his MRI from yesterday. No acute events overnight.  Afebrile T-max 98.7 Blood work shows improving WBC 10.8K, hemoglobin 9.1 g. Blood sugar holding fairly stable 180s to 210  Assessment & Plan:  MSSA sepsis due to Rt Diabetic foot infection/osteomyelitis-s/p Rt TMA/MSSA UTI/cervical C3-C5  Fluid collection: Seen by ID,  orthopedics, neurosurgery. S/p TEE negative for endocarditis, normal EF with tiny PFO with right to left shunt, status post I&D of ulcer on the left great toe, s/p TMA ray amputation of right foot with wound VAC placement 3/6-wound VAC out 3/11 and on healing well on daily dry dressing. Neurosurgery recommends 6 weeks to 3 months and repeat MRI C-spine with and without contrast in 6 weeks or sooner if progressive myelopathy develops if develops worse back pain will need MRI lumbar spine with and without contrast. He needs follow-up with ID clinic in 4 weeks and to cont w/ 6 wk of Ancef per ID.Patient is having intermittent fever additionally leukocytosis without neck or back pain, ID was reconsulted  blood culture sent 3/12 and MRI C and T-spine was ordered(see reports below)-no high-grade stenosis noted for acute admission per neurosurgery.  Epidural fluid collection in the cervical spine centered c4-c5 with enhancement suggestive of disc osteomyelitis at same level/ Possible early discitis C6-C7/cervical paraspinal disease on the right with paraspinal soft tissue and musculature consistent with a diffuse infection: I had discussed with neurosurgery PA Costella on-call 3/14 and he will notify Dr. Marcello Moores from neurosurgery.  Continue on antibiotics and plan as #1  Multiple bowel movements, stopped laxatives, addedas needed Imodium. Improved  Scalene Myositis: Prevertebral edema with right-sided paravertebral fluid collections in the mid to lower cervical spine concerning for infection and abscesses with suspected myositis involving the right-sided scalene musculature noted on MRI. Case discussed with ENT Dr. Wilburn Cornelia who recommend antibiotic treatment,no surgical intervention needed  Urine retention continue supportive care.Placed on indwelling Foley- placed 3/8 when he had 700 ml urine on bladder scan.Voiding trial today.  Discontinue  Foley catheter  AKI secondary to ATN in the setting of sepsis.No  obstruction in the ultrasound.  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  Monitor renal function closely. Creat at stable at 1.2  Type 2 diabetes mellitus with hyperglycemia, without long-term current use of insulin: hba1c uncontrolled at 11.3.  Started on Levemir and changed to insulin 70/30,Sugar fairly controlled. Appreciate diabetic coordinator follow-up. monitor and adjust insulin.Marland Kitchen   Recent Labs  Lab 02/11/20 0649 02/11/20 1126 02/11/20 1641 02/11/20 2128 02/12/20 0733  GLUCAP 188* 213* 188* 200* 210*   Ulcer of left foot due to type 2 diabetes mellitus cont wound care  Hyponatremia: Likely from hypovolemia. Sodium improved,  Generalized weakness/frequent fall : Continue PT OT and will need a skilled nursing facility placement.  SW working on it. Discussed with Education officer, museum.  Seen by neurology sent neuropathy,  B1 B6 level, which are WNL, MMA slightly up at 403,.  Transaminitis with ALP elevation. AST/ALT also elevated mildly in 50s now, improving.Hepatitis panel negative.  Likely from sepsis.  Ultrasound shows cholelithiasis but no obstruction. Monitor.  Microcytic anemia B12 normal folate normal TSH normal iron panel, with elevated ferritin 543, iron low at 12, TIBC low at 174.  Delusional disorder/impaired memory MRI brain showed mild cerebral atrophy and chronic small vessel ischemia.Currently patient is alert awake oriented, follows commands.  Has some tangential thought process.  HIV screen negative. he reports he is feeling much more clear and able to think better.  He directs towards his sister for any kind of decision.  Nutrition: Nutrition Problem: Increased nutrient needs Etiology: wound healing Signs/Symptoms: estimated needs Interventions: Glucerna shake, MVI Body mass index is 30.1 kg/m.   DVT prophylaxis:SCD Code Status:full Family Communication: plan of care discussed with patient at bedside.updated sister over the phone. She is working on placement- vs home w/  h, working with Development worker, community. Disposition Plan: Patient is from:Home Anticipated Disposition: to skilled nursing facility Barriers to discharge or conditions that needs to be met prior to discharge: Patient admitted with MSSA sepsis foot osteomyelitis, cervical osteo/discitis, remains hospitalized for IV antibiotics.  Having intermittent fever completed MRI C and T-spine planned.Plan for SNF  for IV antibiotics x6 weeks, avoiding for Medicaid application sister working with a Development worker, community, awaiting placement.  Consultants:ID, neurology, cardiology for TEE, neurosurgery, orthopedics  Procedures:  MRI C and T-spine with and without contrast 3/14 1. Progressive enhancing epidural collection in the cervical spine centered at C4-5. Abnormal disc signal, endplate signal, and enhancement suggest disc osteomyelitis at this level. 2. Abnormal epidural fluid enhancement extending superiorly to C2-3 and inferiorly to C5. 3. Additional dural enhancement extends superiorly to the C1-2 level. 4. Subtle signal changes may represent early discitis at C6-7. 5. Cervical paraspinal disease on the right with enhancement of paraspinous soft tissues including paraspinous musculature consistent with diffuse infection. 6. Abnormal signal and enhancement within the right cervical foramina at C3-4, C4-5, and C5-6.    Microbiology: Urine culture MSSA, blood culture MSSA, repeat BLOOD CX 3/6 - so far   Medications: Scheduled Meds:  Chlorhexidine Gluconate Cloth  6 each Topical Daily   feeding supplement (GLUCERNA SHAKE)  237 mL Oral TID BM   feeding supplement (PRO-STAT SUGAR FREE 64)  30 mL Oral BID   insulin aspart  0-15 Units Subcutaneous TID WC   insulin aspart  0-5 Units Subcutaneous QHS   insulin aspart protamine- aspart  8 Units Subcutaneous BID WC   insulin starter kit- pen needles  1 kit Other Once  multivitamin with minerals  1 tablet Oral Daily   sodium chloride flush   3 mL Intravenous Q12H   tamsulosin  0.4 mg Oral Daily   vitamin B-12  1,000 mcg Oral Daily   Continuous Infusions:  sodium chloride 10 mL/hr at 02/09/20 1017    ceFAZolin (ANCEF) IV Stopped (02/12/20 5009)   lactated ringers Stopped (02/03/20 1146)   methocarbamol (ROBAXIN) IV      Antimicrobials: Anti-infectives (From admission, onward)   Start     Dose/Rate Route Frequency Ordered Stop   02/03/20 1130  ceFAZolin (ANCEF) IVPB 1 g/50 mL premix  Status:  Discontinued     1 g 100 mL/hr over 30 Minutes Intravenous Every 6 hours 02/03/20 1124 02/03/20 1139   02/02/20 1100  vancomycin (VANCOREADY) IVPB 750 mg/150 mL  Status:  Discontinued     750 mg 150 mL/hr over 60 Minutes Intravenous Every 24 hours 02/01/20 1218 02/02/20 0221   02/02/20 0600  ceFAZolin (ANCEF) IVPB 2g/100 mL premix     2 g 200 mL/hr over 30 Minutes Intravenous Every 8 hours 02/02/20 0221     02/01/20 2230  ceFEPIme (MAXIPIME) 2 g in sodium chloride 0.9 % 100 mL IVPB  Status:  Discontinued     2 g 200 mL/hr over 30 Minutes Intravenous Every 12 hours 02/01/20 1218 02/02/20 0221   02/01/20 1200  metroNIDAZOLE (FLAGYL) IVPB 500 mg  Status:  Discontinued     500 mg 100 mL/hr over 60 Minutes Intravenous Every 8 hours 02/01/20 1149 02/02/20 0221   02/01/20 1015  ceFEPIme (MAXIPIME) 2 g in sodium chloride 0.9 % 100 mL IVPB     2 g 200 mL/hr over 30 Minutes Intravenous  Once 02/01/20 1008 02/01/20 1056   02/01/20 1015  vancomycin (VANCOREADY) IVPB 1750 mg/350 mL     1,750 mg 175 mL/hr over 120 Minutes Intravenous  Once 02/01/20 1008 02/01/20 1336     Objective: Vitals: Today's Vitals   02/11/20 1004 02/11/20 1645 02/11/20 2127 02/12/20 0500  BP: 106/60 115/71 117/69 113/62  Pulse: 88 83 88 82  Resp: 18 16 16 16   Temp: 98.7 F (37.1 C) 98.1 F (36.7 C) 98.6 F (37 C) 98.1 F (36.7 C)  TempSrc: Oral Oral Oral Oral  SpO2: 93% 94% 95% 96%  Weight:   89.8 kg   Height:      PainSc:   0-No pain      Intake/Output Summary (Last 24 hours) at 02/12/2020 0737 Last data filed at 02/12/2020 0500 Gross per 24 hour  Intake 0 ml  Output 1975 ml  Net -1975 ml   Filed Weights   02/09/20 2101 02/10/20 2038 02/11/20 2127  Weight: 89.8 kg 89.8 kg 89.8 kg   Weight change: 0 kg   Intake/Output from previous day: 03/14 0701 - 03/15 0700 In: 0  Out: 1975 [Urine:1975] Intake/Output this shift: No intake/output data recorded.  Examination:  General exam: Alert awake oriented, tangential thoughts present, not in acute distress.   HEENT:Oral mucosa moist, Ear/Nose WNL grossly, dentition normal. Respiratory system: bilaterally clear,no wheezing or crackles,no use of accessory muscle Cardiovascular system: S1 & S2 +, No JVD,. Gastrointestinal system: Abdomen soft, NT,ND, BS+ Nervous System:Alert, awake, moving extremities and grossly nonfocal Extremities: No edema, distal peripheral pulses palpable.  Right foot wound with dressing intact, left grt toe base with small dressing+ Skin: No rashes,no icterus. MSK: Normal muscle bulk,tone, power  Data Reviewed: I have personally reviewed following labs and imaging studies. CBC: Recent Labs  Lab 02/08/20 0430 02/09/20 0458 02/10/20 0404 02/11/20 0437 02/12/20 0300  WBC 10.7* 9.7 11.1* 12.6* 10.8*  NEUTROABS 8.1* 6.3 8.1* 9.1* 6.9  HGB 8.8* 9.2* 9.0* 8.8* 9.1*  HCT 28.1* 29.3* 28.9* 28.2* 29.7*  MCV 71.1* 71.8* 71.7* 72.1* 73.0*  PLT 457* 469* 447* 422* 734*   Basic Metabolic Panel: Recent Labs  Lab 02/06/20 0421 02/07/20 0347 02/08/20 0430 02/09/20 0458 02/10/20 0404  NA 127* 127* 129* 131* 131*  K 5.0 4.7 4.5 4.5 4.2  CL 93* 91* 93* 96* 97*  CO2 23 25 24 23 24   GLUCOSE 154* 178* 188* 205* 191*  BUN 24* 26* 27* 31* 32*  CREATININE 1.37* 1.35* 1.36* 1.27* 1.21  CALCIUM 8.1* 8.3* 8.1* 8.5* 8.5*   GFR: Estimated Creatinine Clearance: 73.4 mL/min (by C-G formula based on SCr of 1.21 mg/dL). Liver Function Tests: Recent Labs   Lab 02/06/20 0421 02/07/20 0347 02/08/20 0430 02/09/20 0458 02/10/20 0404  AST 145* 212* 171* 102* 56*  ALT 62* 100* 102* 78* 50*  ALKPHOS 229* 241* 256* 246* 208*  BILITOT 0.2* 0.4 0.3 0.2* 0.3  PROT 6.3* 6.3* 6.2* 6.5 6.5  ALBUMIN 1.6* 1.6* 1.7* 1.8* 1.8*   No results for input(s): LIPASE, AMYLASE in the last 168 hours. No results for input(s): AMMONIA in the last 168 hours. Coagulation Profile: No results for input(s): INR, PROTIME in the last 168 hours. Cardiac Enzymes: No results for input(s): CKTOTAL, CKMB, CKMBINDEX, TROPONINI in the last 168 hours. BNP (last 3 results) No results for input(s): PROBNP in the last 8760 hours. HbA1C: No results for input(s): HGBA1C in the last 72 hours. CBG: Recent Labs  Lab 02/11/20 0649 02/11/20 1126 02/11/20 1641 02/11/20 2128 02/12/20 0733  GLUCAP 188* 213* 188* 200* 210*   Lipid Profile: No results for input(s): CHOL, HDL, LDLCALC, TRIG, CHOLHDL, LDLDIRECT in the last 72 hours. Thyroid Function Tests: No results for input(s): TSH, T4TOTAL, FREET4, T3FREE, THYROIDAB in the last 72 hours. Anemia Panel: No results for input(s): VITAMINB12, FOLATE, FERRITIN, TIBC, IRON, RETICCTPCT in the last 72 hours. Sepsis Labs: No results for input(s): PROCALCITON, LATICACIDVEN in the last 168 hours.  Recent Results (from the past 240 hour(s))  MRSA PCR Screening     Status: None   Collection Time: 02/02/20  9:37 AM   Specimen: Nasal Mucosa; Nasopharyngeal  Result Value Ref Range Status   MRSA by PCR NEGATIVE NEGATIVE Final    Comment:        The GeneXpert MRSA Assay (FDA approved for NASAL specimens only), is one component of a comprehensive MRSA colonization surveillance program. It is not intended to diagnose MRSA infection nor to guide or monitor treatment for MRSA infections. Performed at [facility], [place] [address] [place], [place] [address]   Surgical pcr screen     Status: Abnormal   Collection Time: 02/02/20  10:41 PM   Specimen: Nasal Mucosa; Nasal Swab  Result Value Ref Range Status   MRSA, PCR NEGATIVE NEGATIVE Final   Staphylococcus aureus POSITIVE (A) NEGATIVE Final    Comment: (NOTE) The Xpert SA Assay (FDA approved for NASAL specimens in patients 81 years of age and older), is one component of a comprehensive surveillance program. It is not intended to diagnose infection nor to guide or monitor treatment. Performed at [facility], [place] [address] [place], [place] [address]   Culture, blood (routine x 2)     Status: None   Collection Time: 02/03/20  2:24 PM  Specimen: BLOOD  Result Value Ref Range Status   Specimen Description BLOOD LEFT ARM  Final   Special Requests   Final    BOTTLES DRAWN AEROBIC AND ANAEROBIC Blood Culture adequate volume   Culture   Final    NO GROWTH 5 DAYS Performed at [facility], [address] [place], [place] [address]    Report Status 02/08/2020 FINAL  Final  Culture, blood (routine x 2)     Status: None   Collection Time: 02/03/20  2:26 PM   Specimen: BLOOD  Result Value Ref Range Status   Specimen Description BLOOD LEFT HAND  Final   Special Requests   Final    BOTTLES DRAWN AEROBIC AND ANAEROBIC Blood Culture adequate volume   Culture   Final    NO GROWTH 5 DAYS Performed at [facility], [place] [address], [place], [place] [address]    Report Status 02/08/2020 FINAL  Final  Culture, blood (routine x 2)     Status: None (Preliminary result)   Collection Time: 02/09/20  1:38 PM   Specimen: BLOOD  Result Value Ref Range Status   Specimen Description BLOOD LEFT ANTECUBITAL  Final   Special Requests AEROBIC BOTTLE ONLY Blood Culture adequate volume  Final   Culture   Final    NO GROWTH 2 DAYS Performed at [facility], [place] [address], [place], [place] [address]    Report Status PENDING  Incomplete  Culture, blood (routine x 2)     Status: None (Preliminary result)   Collection Time: 02/09/20  1:39 PM   Specimen:  BLOOD LEFT HAND  Result Value Ref Range Status   Specimen Description BLOOD LEFT HAND  Final   Special Requests   Final    BOTTLES DRAWN AEROBIC AND ANAEROBIC Blood Culture adequate volume   Culture   Final    NO GROWTH 2 DAYS Performed at [facility], [place] [address], [place],  [address]    Report Status PENDING  Incomplete      Radiology Studies: MR CERVICAL SPINE W WO CONTRAST  Result Date: 02/11/2020 CLINICAL DATA:  Question epidural abscess. Recent falls and weakness. Hyperglycemia. MSSA bacteremia. Osteomyelitis of the foot. Previous abnormal MRI of the cervical spine with possible epidural abscess. EXAM: MRI CERVICAL AND LUMBAR SPINE WITHOUT AND WITH CONTRAST TECHNIQUE: Multiplanar and multiecho pulse sequences of the cervical spine, to include the craniocervical junction and cervicothoracic junction, and lumbar spine, were obtained without and with intravenous contrast. CONTRAST:  87m GADAVIST GADOBUTROL 1 MMOL/ML IV SOLN COMPARISON:  MRI of the cervical spine without contrast 02/01/2020. FINDINGS: MRI CERVICAL SPINE FINDINGS Alignment: Alignment is anatomic Vertebrae: Edema and enhancement is present at C4-5, most prominent on the right and posteriorly. Vertebral body heights are maintained. More subtle changes are present on the right at C6-7. Cord: Normal signal is present in the cervical and upper thoracic spinal cord. Cord morphology is preserved. Posterior Fossa, vertebral arteries, paraspinal tissues: Craniocervical junction is normal. Flow is present in the vertebral arteries bilaterally. Visualized intracranial contents are normal. Disc levels: Previously noted ventral epidural collection on the right has expanded. There is diffuse enhancement associated with the collection. Collection is centered at C4-5 but extends superiorly to the C2 level. The collection extends inferiorly to C5-6. Abnormal enhancement extends to the C1-2 level on the right into C7 inferiorly.  Abnormal signal and enhancement is present within the right neural foramina at C3-4, C4-5, and C5-6. Abnormal signal  and enhancement is present about the C5-6 facet joint on the right. Dorsal epidural enhancement is present at C3-4 and C4-5. MRI THORACIC SPINE FINDINGS Alignment:  No significant listhesis is present. Vertebrae: Marrow signal is diffusely depressed. No discrete lesions are present. No pathologic enhancement is present. Conus medullaris and cauda equina: Conus extends to the L1 level. Conus and cauda equina appear normal. Paraspinal and other soft tissues: Paraspinous soft tissues are within normal limits. No enhancing soft tissue is present. Visualized lung fields are clear. Disc levels: No focal disc protrusion or stenosis is present. The central canal and foramina are patent throughout the thoracic spine. IMPRESSION: 1. Progressive enhancing epidural collection in the cervical spine centered at C4-5. Abnormal disc signal, endplate signal, and enhancement suggest disc osteomyelitis at this level. 2. Abnormal epidural fluid enhancement extending superiorly to C2-3 and inferiorly to C5. 3. Additional dural enhancement extends superiorly to the C1-2 level. 4. Subtle signal changes may represent early discitis at C6-7. 5. Cervical paraspinal disease on the right with enhancement of paraspinous soft tissues including paraspinous musculature consistent with diffuse infection. 6. Abnormal signal and enhancement within the right cervical foramina at C3-4, C4-5, and C5-6. Electronically Signed   By: San Morelle M.D.   On: 02/11/2020 17:16   MR THORACIC SPINE W WO CONTRAST  Result Date: 02/11/2020 CLINICAL DATA:  Question epidural abscess. Recent falls and weakness. Hyperglycemia. MSSA bacteremia. Osteomyelitis of the foot. Previous abnormal MRI of the cervical spine with possible epidural abscess. EXAM: MRI CERVICAL AND LUMBAR SPINE WITHOUT AND WITH CONTRAST TECHNIQUE: Multiplanar and multiecho  pulse sequences of the cervical spine, to include the craniocervical junction and cervicothoracic junction, and lumbar spine, were obtained without and with intravenous contrast. CONTRAST:  37m GADAVIST GADOBUTROL 1 MMOL/ML IV SOLN COMPARISON:  MRI of the cervical spine without contrast 02/01/2020. FINDINGS: MRI CERVICAL SPINE FINDINGS Alignment: Alignment is anatomic Vertebrae: Edema and enhancement is present at C4-5, most prominent on the right and posteriorly. Vertebral body heights are maintained. More subtle changes are present on the right at C6-7. Cord: Normal signal is present in the cervical and upper thoracic spinal cord. Cord morphology is preserved. Posterior Fossa, vertebral arteries, paraspinal tissues: Craniocervical junction is normal. Flow is present in the vertebral arteries bilaterally. Visualized intracranial contents are normal. Disc levels: Previously noted ventral epidural collection on the right has expanded. There is diffuse enhancement associated with the collection. Collection is centered at C4-5 but extends superiorly to the C2 level. The collection extends inferiorly to C5-6. Abnormal enhancement extends to the C1-2 level on the right into C7 inferiorly. Abnormal signal and enhancement is present within the right neural foramina at C3-4, C4-5, and C5-6. Abnormal signal and enhancement is present about the C5-6 facet joint on the right. Dorsal epidural enhancement is present at C3-4 and C4-5. MRI THORACIC SPINE FINDINGS Alignment:  No significant listhesis is present. Vertebrae: Marrow signal is diffusely depressed. No discrete lesions are present. No pathologic enhancement is present. Conus medullaris and cauda equina: Conus extends to the L1 level. Conus and cauda equina appear normal. Paraspinal and other soft tissues: Paraspinous soft tissues are within normal limits. No enhancing soft tissue is present. Visualized lung fields are clear. Disc levels: No focal disc protrusion or  stenosis is present. The central canal and foramina are patent throughout the thoracic spine. IMPRESSION: 1. Progressive enhancing epidural collection in the cervical spine centered at C4-5. Abnormal disc signal, endplate signal, and enhancement suggest disc osteomyelitis at this level. 2.  Abnormal epidural fluid enhancement extending superiorly to C2-3 and inferiorly to C5. 3. Additional dural enhancement extends superiorly to the C1-2 level. 4. Subtle signal changes may represent early discitis at C6-7. 5. Cervical paraspinal disease on the right with enhancement of paraspinous soft tissues including paraspinous musculature consistent with diffuse infection. 6. Abnormal signal and enhancement within the right cervical foramina at C3-4, C4-5, and C5-6. Electronically Signed   By: San Morelle M.D.   On: 02/11/2020 17:16     LOS: 11 days   Time spent: More than 50% of that time was spent in counseling and/or coordination of care.  Antonieta Pert, MD [facility]  02/12/2020, 7:37 AM

## 2020-02-12 NOTE — Plan of Care (Signed)
  Problem: Activity: Goal: Risk for activity intolerance will decrease Outcome: Progressing   

## 2020-02-13 DIAGNOSIS — M542 Cervicalgia: Secondary | ICD-10-CM

## 2020-02-13 LAB — CBC WITH DIFFERENTIAL/PLATELET
Abs Immature Granulocytes: 0.06 10*3/uL (ref 0.00–0.07)
Basophils Absolute: 0.1 10*3/uL (ref 0.0–0.1)
Basophils Relative: 1 %
Eosinophils Absolute: 0.5 10*3/uL (ref 0.0–0.5)
Eosinophils Relative: 5 %
HCT: 29.2 % — ABNORMAL LOW (ref 39.0–52.0)
Hemoglobin: 9 g/dL — ABNORMAL LOW (ref 13.0–17.0)
Immature Granulocytes: 1 %
Lymphocytes Relative: 23 %
Lymphs Abs: 2.4 10*3/uL (ref 0.7–4.0)
MCH: 22.2 pg — ABNORMAL LOW (ref 26.0–34.0)
MCHC: 30.8 g/dL (ref 30.0–36.0)
MCV: 71.9 fL — ABNORMAL LOW (ref 80.0–100.0)
Monocytes Absolute: 0.8 10*3/uL (ref 0.1–1.0)
Monocytes Relative: 8 %
Neutro Abs: 6.3 10*3/uL (ref 1.7–7.7)
Neutrophils Relative %: 62 %
Platelets: 442 10*3/uL — ABNORMAL HIGH (ref 150–400)
RBC: 4.06 MIL/uL — ABNORMAL LOW (ref 4.22–5.81)
RDW: 15.4 % (ref 11.5–15.5)
WBC: 10.1 10*3/uL (ref 4.0–10.5)
nRBC: 0 % (ref 0.0–0.2)

## 2020-02-13 LAB — COMPREHENSIVE METABOLIC PANEL
ALT: 24 U/L (ref 0–44)
AST: 31 U/L (ref 15–41)
Albumin: 1.8 g/dL — ABNORMAL LOW (ref 3.5–5.0)
Alkaline Phosphatase: 160 U/L — ABNORMAL HIGH (ref 38–126)
Anion gap: 10 (ref 5–15)
BUN: 31 mg/dL — ABNORMAL HIGH (ref 6–20)
CO2: 24 mmol/L (ref 22–32)
Calcium: 8.5 mg/dL — ABNORMAL LOW (ref 8.9–10.3)
Chloride: 98 mmol/L (ref 98–111)
Creatinine, Ser: 1.27 mg/dL — ABNORMAL HIGH (ref 0.61–1.24)
GFR calc Af Amer: >60 mL/min (ref >60)
GFR calc non Af Amer: >60 mL/min (ref >60)
Glucose, Bld: 215 mg/dL — ABNORMAL HIGH (ref 70–99)
Potassium: 4.5 mmol/L (ref 3.5–5.1)
Sodium: 132 mmol/L — ABNORMAL LOW (ref 135–145)
Total Bilirubin: 0.3 mg/dL (ref 0.3–1.2)
Total Protein: 6.8 g/dL (ref 6.5–8.1)

## 2020-02-13 LAB — GLUCOSE, CAPILLARY
Glucose-Capillary: 209 mg/dL — ABNORMAL HIGH (ref 70–99)
Glucose-Capillary: 153 mg/dL — ABNORMAL HIGH (ref 70–99)
Glucose-Capillary: 197 mg/dL — ABNORMAL HIGH (ref 70–99)
Glucose-Capillary: 181 mg/dL — ABNORMAL HIGH (ref 70–99)

## 2020-02-13 NOTE — Progress Notes (Signed)
[facility]  Date of Admission:  02/01/2020     Total days of antibiotics 13         ASSESSMENT:  Mr. Toomey continues to progress and has remained afebrile without complications. Repeat blood cultures remain without growth to date. Discussed the plan of care and neck pain likely related to infection.  Will continue with previous OPAT plan for 6 weeks of Cefazolin with end date of 03/16/20. Continue wound care per Dr. Sharol Given with follow up scheduled with ID clinic in 4 weeks.   ID will sign off.   PLAN:  1. Continue Cefazolin through 03/16/20. 2. OPAT orders placed.  3. Wound care per Dr. Sharol Given. 4. Follow up with Dr. Baxter Flattery on 03/06/20 as planned.   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 Chlorhexidine Gluconate Cloth  6 each Topical Daily  . feeding supplement (GLUCERNA SHAKE)  237 mL Oral TID BM  . feeding supplement (PRO-STAT SUGAR FREE 64)  30 mL Oral BID  . insulin aspart  0-15 Units Subcutaneous TID WC  . insulin aspart  0-5 Units Subcutaneous QHS  . insulin aspart protamine- aspart  8 Units Subcutaneous BID WC  . insulin starter kit- pen needles  1 kit Other Once  . multivitamin with minerals  1 tablet Oral Daily  . sodium chloride flush  3 mL Intravenous Q12H  . tamsulosin  0.4 mg Oral Daily  . vitamin B-12  1,000 mcg Oral Daily    SUBJECTIVE:  Afebrile overnight with no acute events. He does have some neck muscle pain today. Has several concerns about time blocking hospital visits by providers and other services.    Allergies  Allergen Reactions  . Codeine     Kept sedated      Review of Systems: Review of Systems  Constitutional: Negative for chills, fever and weight loss.  Respiratory: Negative for cough, shortness of breath and wheezing.   Cardiovascular: Negative for chest pain and leg swelling.  Gastrointestinal: Negative for abdominal pain, constipation, diarrhea, nausea and vomiting.  Skin: Negative for rash.      OBJECTIVE: Vitals:   02/12/20 1626 02/12/20 2009 02/13/20 0431 02/13/20 0855  BP: (!) 142/72 117/70 128/71 123/61  Pulse: 83 86 79 87  Resp: 18 15 16 18   Temp: 99.6 F (37.6 C) 98.1 F (36.7 C) 97.6 F (36.4 C) 97.8 F (36.6 C)  TempSrc: Oral  Oral Oral  SpO2: 95% 96% 97% 99%  Weight:      Height:       Body mass index is 30.1 kg/m.  Physical Exam Constitutional:      General: He is not in acute distress.    Appearance: He is well-developed.  Cardiovascular:     Rate and Rhythm: Normal rate and regular rhythm.     Heart sounds: Normal heart sounds.  Pulmonary:     Effort: Pulmonary effort is normal.     Breath sounds: Normal breath sounds.  Skin:    General: Skin is warm and dry.  Neurological:     Mental Status: He is alert and oriented to person, place, and time.  Psychiatric:        Behavior: Behavior normal.        Thought Content: Thought content normal.        Judgment: Judgment normal.     Lab Results Lab Results  Component Value Date   WBC 10.1 02/13/2020   HGB 9.0 (L) 02/13/2020   HCT 29.2 (L) 02/13/2020   MCV 71.9 (L) 02/13/2020   PLT 442 (H) 02/13/2020    Lab Results  Component Value Date   CREATININE 1.27 (H) 02/13/2020   BUN 31 (H) 02/13/2020   NA 132 (L) 02/13/2020   K 4.5 02/13/2020   CL 98 02/13/2020   CO2 24 02/13/2020    Lab Results  Component Value Date   ALT 24 02/13/2020   AST 31 02/13/2020   ALKPHOS 160 (H) 02/13/2020   BILITOT 0.3 02/13/2020     Microbiology: Recent Results (from the past 240 hour(s))   Culture, blood (routine x 2)     Status: None   Collection Time: 02/03/20  2:24 PM   Specimen: BLOOD  Result Value Ref Range Status   Specimen Description BLOOD LEFT ARM  Final   Special Requests   Final    BOTTLES DRAWN AEROBIC AND ANAEROBIC Blood Culture adequate volume   Culture   Final    NO GROWTH 5 DAYS Performed at [facility], [address], [place], [place] [address]    Report Status 02/08/2020 FINAL  Final  Culture, blood (routine x 2)     Status: None   Collection Time: 02/03/20  2:26 PM   Specimen: BLOOD  Result Value Ref Range Status   Specimen Description BLOOD LEFT HAND  Final   Special Requests   Final    BOTTLES DRAWN AEROBIC AND ANAEROBIC Blood Culture adequate volume   Culture   Final    NO GROWTH 5 DAYS Performed at [facility], [place] [address], [place], [place] [address]    Report Status 02/08/2020 FINAL  Final  Culture, blood (routine x 2)     Status: None (Preliminary result)   Collection Time: 02/09/20  1:38 PM   Specimen: BLOOD  Result Value Ref Range Status   Specimen Description BLOOD LEFT ANTECUBITAL  Final   Special Requests AEROBIC BOTTLE ONLY Blood Culture adequate volume  Final   Culture   Final    NO GROWTH 4 DAYS Performed at [facility], [place] [address] [place], [place] [address]    Report Status PENDING  Incomplete  Culture, blood (routine x 2)     Status: None (Preliminary result)   Collection Time: 02/09/20  1:39 PM   Specimen: BLOOD LEFT HAND  Result Value Ref Range Status   Specimen Description BLOOD LEFT HAND  Final   Special Requests   Final    BOTTLES DRAWN AEROBIC AND ANAEROBIC Blood Culture adequate volume   Culture   Final    NO GROWTH 4 DAYS Performed at [facility], [place] [address] [place],  [address]    Report Status PENDING  Incomplete     Terri Piedra, [facility]   02/13/2020  11:12 AM

## 2020-02-13 NOTE — Progress Notes (Addendum)
OT Cancellation Note  Patient Details Name: Chris Bowman MRN: [id number] DOB: 1961-03-16   Cancelled Treatment:    Reason Eval/Treat Not Completed: Patient declined, no reason specified. Pt states that he is waiting for NT to get him on bed pan and it's not a good time for therapy. Therapist explained pt that OT could assist him onto [facility] where he would be practicing his functional mobility/transfers, however he continued to politely decline but stated that he'd think about it for next time  Galen Manila 02/13/2020, 2:00 PM

## 2020-02-13 NOTE — Plan of Care (Signed)
  Problem: Health Behavior/Discharge Planning: Goal: Ability to manage health-related needs will improve Outcome: Progressing   Problem: Clinical Measurements: Goal: Will remain free from infection Outcome: Progressing   

## 2020-02-13 NOTE — Progress Notes (Signed)
PROGRESS NOTE    Chris Bowman  PPJ:[id number] DOB: 11-18-1961 DOA: 02/01/2020 PCP: Patient, No Pcp Per   Brief Narrative: 59 year old male with UNknown medical history, who has not seen a doctor since thousand 18 presented to the ER on 3/4 with weakness and falls x2 weeks and associated fever dyspnea cough, chronic buttock pain since the fall along with limitation of range of motion, right arm pain.  Apparently diagnosed with diabetes in thousand and but did not think he needed medicine for it, reported history of taking too many baby aspirin and having stomach pain" I have a little incision is scar from that" in the ED afebrile Covid negative WBC 20.4K glucose 300 A1c 11 sodium 120,patient was admitted under hospitalist.Work-up showed MSSA UTI, bacteremia bilateral foot osteomyelitis with associated sepsis and concern for cervical spine epidural abscess/phlegmon C3-C5 no cord compression, no evidence of discitis or osteomyelitis.Patient was seen by neurosurgery, infectious disease.Patient underwent TEE-negative for endocarditis, MRI spine discitis/osteo-/small phlegmon, is a status post I&D of the left toe, 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  Patient is on Ancef as per ID and will need 6 weeks of IV antibiotic, repeat blood culture on 3/6 and PICC line 3/10.On 3/8 urine retention needing in and out cath and subsequently needing Foley catheter. 3/11 temp spike 101 and 3/10 100.5. ID reconsulted and Blood cx/MRI c-t spine ordered.  Subjective:  Seen with sister at the bedside. Afebrile more than  72 hours. No new complaints. WBC improved to 10.K.  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 S/p TEE negative for endocarditis, normal EF with tiny PFO with right to left shunt, status post I&D of ulcer on the left great toe, s/p TMA ray amputation of right  foot with wound VAC placement 3/6-wound VAC out 3/11 and healing well on daily dry dressing. Neurosurgery recommends 6 weeks to 3 months and repeat MRI C-spine with and without contrast in 6 weeks or sooner if progressive myelopathy develops or if develops worse back pain will need MRI lumbar spine with and without contrast.  Continue Ancef plan for 6 weeks or longer will need to follow-up with infectious disease.repeat blood culture and ID eval 3/12-negative.  Repeat CAT MRI with and without contrast no extension into thoracic area, some expected progression this early in cervical area and note changes otherwise in treatment except IV antibiotics.I had discussed with neurosurgery PA Costella on-call 3/14 and notifed Dr. Marcello Moores from neurosurgery too.  Multiple bowel movements, stopped laxatives, addedas needed Imodium. Improved  Scalene Myositis: Prevertebral edema with right-sided paravertebral fluid collections in the mid to lower cervical spine concerning for infection and abscesses with suspected myositis involving the right-sided scalene musculature noted on MRI. Case discussed with ENT Dr. Wilburn Cornelia who recommend antibiotic treatment,no surgical intervention needed  Urine retention continue supportive care.Placed on indwelling Foley- placed 3/8 when he had 700 ml urine on bladder scan.  Wants to keep the Foley for now, request to be removed tomorrow or day after as he is not much mobile.  Will discuss again tomorrow  AKI secondary to ATN in the setting of sepsis.No obstruction in the ultrasound.  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  Monitor renal function closely. Creat at stable at 1.2 monitor  Type 2 diabetes mellitus with hyperglycemia, without long-term current use of insulin: hba1c uncontrolled at 11.3.  Started on Levemir and changed to insulin  70/30,Sugar fairly controlled. Appreciate diabetic coordinator follow-up. monitor and adjust insulin.Marland Kitchen   Recent Labs  Lab 02/12/20 1131  02/12/20 1627 02/12/20 2008 02/13/20 0723 02/13/20 1127  GLUCAP 154* 175* 188* 209* 153*   Ulcer of left foot due to type 2 diabetes mellitus cont wound care  Hyponatremia: Likely from hypovolemia. Sodium improved,  Generalized weakness/frequent fall : Continue PT OT and will need a skilled nursing facility placement.  SW working on it. Discussed with Education officer, museum.  Seen by neurology sent neuropathy,  B1 B6 level, which are WNL, MMA slightly up at 403,.  Transaminitis with ALP elevation. AST/ALT also elevated mildly in 50s now, improving.Hepatitis panel negative.  Likely from sepsis.  Ultrasound shows cholelithiasis but no obstruction. Monitor.  Microcytic anemia B12 normal folate normal TSH normal iron panel, with elevated ferritin 543, iron low at 12, TIBC low at 174.  Delusional disorder/impaired memory MRI brain showed mild cerebral atrophy and chronic small vessel ischemia.Currently patient is alert awake oriented, follows commands.  Has some tangential thought process.  HIV screen negative. he reports he is feeling much more clear and able to think better.  He directs towards his sister for any kind of decision.  Nutrition: Nutrition Problem: Increased nutrient needs Etiology: wound healing Signs/Symptoms: estimated needs Interventions: Glucerna shake, MVI Body mass index is 30.1 kg/m.   DVT prophylaxis:SCD Code Status:full Family Communication: plan of care discussed with patient at bedside.updated sister over the phone and in person.She is working on placement- vs home w/ h, working with Development worker, community. Disposition Plan: Patient is from:Home Anticipated Disposition: to skilled nursing facility Barriers to discharge or conditions that needs to be met prior to discharge: Patient admitted with MSSA sepsis foot osteomyelitis, cervical osteo/discitis, remains hospitalized for IV antibiotics.  Having intermittent fever completed MRI C and T-spine obtained and no extension of  epidural abscess. Plan for SNF  for IV antibiotics x6 weeks or longer. Awaiting medicaid application-sister working with a Development worker, community, awaiting placement.  Consultants:ID, neurology, cardiology for TEE, neurosurgery, orthopedics  Procedures:  MRI C and T-spine with and without contrast 3/14 1. Progressive enhancing epidural collection in the cervical spine centered at C4-5. Abnormal disc signal, endplate signal, and enhancement suggest disc osteomyelitis at this level. 2. Abnormal epidural fluid enhancement extending superiorly to C2-3 and inferiorly to C5. 3. Additional dural enhancement extends superiorly to the C1-2 level. 4. Subtle signal changes may represent early discitis at C6-7. 5. Cervical paraspinal disease on the right with enhancement of paraspinous soft tissues including paraspinous musculature consistent with diffuse infection. 6. Abnormal signal and enhancement within the right cervical foramina at C3-4, C4-5, and C5-6.    Microbiology: Urine culture MSSA, blood culture MSSA, repeat BLOOD CX 3/6 - so far   Medications: Scheduled Meds: . Chlorhexidine Gluconate Cloth  6 each Topical Daily  . feeding supplement (GLUCERNA SHAKE)  237 mL Oral TID BM  . feeding supplement (PRO-STAT SUGAR FREE 64)  30 mL Oral BID  . insulin aspart  0-15 Units Subcutaneous TID WC  . insulin aspart  0-5 Units Subcutaneous QHS  . insulin aspart protamine- aspart  8 Units Subcutaneous BID WC  . insulin starter kit- pen needles  1 kit Other Once  . multivitamin with minerals  1 tablet Oral Daily  . sodium chloride flush  3 mL Intravenous Q12H  . tamsulosin  0.4 mg Oral Daily  . vitamin B-12  1,000 mcg Oral Daily   Continuous Infusions: . sodium chloride 10 mL/hr at 02/09/20  1017  .  ceFAZolin (ANCEF) IV 2 g (02/13/20 0542)  . lactated ringers Stopped (02/03/20 1146)  . methocarbamol (ROBAXIN) IV      Antimicrobials: Anti-infectives (From admission, onward)   Start      Dose/Rate Route Frequency Ordered Stop   02/03/20 1130  ceFAZolin (ANCEF) IVPB 1 g/50 mL premix  Status:  Discontinued     1 g 100 mL/hr over 30 Minutes Intravenous Every 6 hours 02/03/20 1124 02/03/20 1139   02/02/20 1100  vancomycin (VANCOREADY) IVPB 750 mg/150 mL  Status:  Discontinued     750 mg 150 mL/hr over 60 Minutes Intravenous Every 24 hours 02/01/20 1218 02/02/20 0221   02/02/20 0600  ceFAZolin (ANCEF) IVPB 2g/100 mL premix     2 g 200 mL/hr over 30 Minutes Intravenous Every 8 hours 02/02/20 0221     02/01/20 2230  ceFEPIme (MAXIPIME) 2 g in sodium chloride 0.9 % 100 mL IVPB  Status:  Discontinued     2 g 200 mL/hr over 30 Minutes Intravenous Every 12 hours 02/01/20 1218 02/02/20 0221   02/01/20 1200  metroNIDAZOLE (FLAGYL) IVPB 500 mg  Status:  Discontinued     500 mg 100 mL/hr over 60 Minutes Intravenous Every 8 hours 02/01/20 1149 02/02/20 0221   02/01/20 1015  ceFEPIme (MAXIPIME) 2 g in sodium chloride 0.9 % 100 mL IVPB     2 g 200 mL/hr over 30 Minutes Intravenous  Once 02/01/20 1008 02/01/20 1056   02/01/20 1015  vancomycin (VANCOREADY) IVPB 1750 mg/350 mL     1,750 mg 175 mL/hr over 120 Minutes Intravenous  Once 02/01/20 1008 02/01/20 1336     Objective: Vitals: Today's Vitals   02/13/20 0431 02/13/20 0844 02/13/20 0855 02/13/20 1000  BP: 128/71  123/61   Pulse: 79  87   Resp: 16  18   Temp: 97.6 F (36.4 C)  97.8 F (36.6 C)   TempSrc: Oral  Oral   SpO2: 97%  99%   Weight:      Height:      PainSc:  0-No pain  0-No pain    Intake/Output Summary (Last 24 hours) at 02/13/2020 1133 Last data filed at 02/13/2020 0900 Gross per 24 hour  Intake 980 ml  Output 1100 ml  Net -120 ml   Filed Weights   02/09/20 2101 02/10/20 2038 02/11/20 2127  Weight: 89.8 kg 89.8 kg 89.8 kg   Weight change:    Intake/Output from previous day: 03/15 0701 - 03/16 0700 In: 800 [P.O.:600; IV Piggyback:200] Out: 1150 [Urine:1150] Intake/Output this shift: Total I/O In:  300 [P.O.:300] Out: 750 [Urine:750]  Examination:  General exam: AAOX3, not in distress HEENT:Oral mucosa moist, Ear/Nose WNL grossly, dentition normal. Respiratory system: bilaterally clear,no wheezing or crackles,no use of accessory muscle Cardiovascular system: S1 & S2 +, No JVD,. Gastrointestinal system: Abdomen soft, NT,ND, BS+ Nervous System:Alert, awake, moving extremities and grossly nonfocal Extremities: No edema, distal peripheral pulses palpable.  Right foot wound with dressing intact, left grt toe base with small dressing+ Skin: No rashes,no icterus. MSK: Normal muscle bulk,tone, power  Data Reviewed: I have personally reviewed following labs and imaging studies. CBC: Recent Labs  Lab 02/09/20 0458 02/10/20 0404 02/11/20 0437 02/12/20 0300 02/13/20 0335  WBC 9.7 11.1* 12.6* 10.8* 10.1  NEUTROABS 6.3 8.1* 9.1* 6.9 6.3  HGB 9.2* 9.0* 8.8* 9.1* 9.0*  HCT 29.3* 28.9* 28.2* 29.7* 29.2*  MCV 71.8* 71.7* 72.1* 73.0* 71.9*  PLT 469* 447* 422* 457* 442*  Basic Metabolic Panel: Recent Labs  Lab 02/07/20 0347 02/08/20 0430 02/09/20 0458 02/10/20 0404 02/13/20 0335  NA 127* 129* 131* 131* 132*  K 4.7 4.5 4.5 4.2 4.5  CL 91* 93* 96* 97* 98  CO2 _0 GLUCOSE 178* 188* 205* 191* 215*  BUN 26* 27* 31* 32* 31*  CREATININE 1.35* 1.36* 1.27* 1.21 1.27*  CALCIUM 8.3* 8.1* 8.5* 8.5* 8.5*   GFR: Estimated Creatinine Clearance: 69.9 mL/min (A) (by C-G formula based on SCr of 1.27 mg/dL (H)). Liver Function Tests: Recent Labs  Lab 02/07/20 0347 02/08/20 0430 02/09/20 0458 02/10/20 0404 02/13/20 0335  AST 212* 171* 102* 56* 31  ALT 100* 102* 78* 50* 24  ALKPHOS 241* 256* 246* 208* 160*  BILITOT 0.4 0.3 0.2* 0.3 0.3  PROT 6.3* 6.2* 6.5 6.5 6.8  ALBUMIN 1.6* 1.7* 1.8* 1.8* 1.8*   No results for input(s): LIPASE, AMYLASE in the last 168 hours. No results for input(s): AMMONIA in the last 168 hours. Coagulation Profile: No results for input(s): INR,  PROTIME in the last 168 hours. Cardiac Enzymes: No results for input(s): CKTOTAL, CKMB, CKMBINDEX, TROPONINI in the last 168 hours. BNP (last 3 results) No results for input(s): PROBNP in the last 8760 hours. HbA1C: No results for input(s): HGBA1C in the last 72 hours. CBG: Recent Labs  Lab 02/12/20 1131 02/12/20 1627 02/12/20 2008 02/13/20 0723 02/13/20 1127  GLUCAP 154* 175* 188* 209* 153*   Lipid Profile: No results for input(s): CHOL, HDL, LDLCALC, TRIG, CHOLHDL, LDLDIRECT in the last 72 hours. Thyroid Function Tests: No results for input(s): TSH, T4TOTAL, FREET4, T3FREE, THYROIDAB in the last 72 hours. Anemia Panel: No results for input(s): VITAMINB12, FOLATE, FERRITIN, TIBC, IRON, RETICCTPCT in the last 72 hours. Sepsis Labs: No results for input(s): PROCALCITON, LATICACIDVEN in the last 168 hours.  Recent Results (from the past 240 hour(s))  Culture, blood (routine x 2)     Status: None   Collection Time: 02/03/20  2:24 PM   Specimen: BLOOD  Result Value Ref Range Status   Specimen Description BLOOD LEFT ARM  Final   Special Requests   Final    BOTTLES DRAWN AEROBIC AND ANAEROBIC Blood Culture adequate volume   Culture   Final    NO GROWTH 5 DAYS Performed at [facility], [address], [place], [place] [address]    Report Status 02/08/2020 FINAL  Final  Culture, blood (routine x 2)     Status: None   Collection Time: 02/03/20  2:26 PM   Specimen: BLOOD  Result Value Ref Range Status   Specimen Description BLOOD LEFT HAND  Final   Special Requests   Final    BOTTLES DRAWN AEROBIC AND ANAEROBIC Blood Culture adequate volume   Culture   Final    NO GROWTH 5 DAYS Performed at [facility], [place] [address], [place], [place] [address]    Report Status 02/08/2020 FINAL  Final  Culture, blood (routine x 2)     Status: None (Preliminary result)   Collection Time: 02/09/20  1:38 PM   Specimen: BLOOD  Result Value Ref Range Status   Specimen  Description BLOOD LEFT ANTECUBITAL  Final   Special Requests AEROBIC BOTTLE ONLY Blood Culture adequate volume  Final   Culture   Final    NO GROWTH 4 DAYS Performed at [facility], [place] [address] [place], [place] [address]    Report Status PENDING  Incomplete  Culture, blood (routine x 2)     Status: None (Preliminary result)   Collection Time: 02/09/20  1:39 PM   Specimen: BLOOD LEFT HAND  Result Value Ref Range Status   Specimen Description BLOOD LEFT HAND  Final   Special Requests   Final    BOTTLES DRAWN AEROBIC AND ANAEROBIC Blood Culture adequate volume   Culture   Final    NO GROWTH 4 DAYS Performed at [facility], [place] [address], [place], [place] [address]    Report Status PENDING  Incomplete      Radiology Studies: MR CERVICAL SPINE W WO CONTRAST  Result Date: 02/11/2020 CLINICAL DATA:  Question epidural abscess. Recent falls and weakness. Hyperglycemia. MSSA bacteremia. Osteomyelitis of the foot. Previous abnormal MRI of the cervical spine with possible epidural abscess. EXAM: MRI CERVICAL AND LUMBAR SPINE WITHOUT AND WITH CONTRAST TECHNIQUE: Multiplanar and multiecho pulse sequences of the cervical spine, to include the craniocervical junction and cervicothoracic junction, and lumbar spine, were obtained without and with intravenous contrast. CONTRAST:  65m GADAVIST GADOBUTROL 1 MMOL/ML IV SOLN COMPARISON:  MRI of the cervical spine without contrast 02/01/2020. FINDINGS: MRI CERVICAL SPINE FINDINGS Alignment: Alignment is anatomic Vertebrae: Edema and enhancement is present at C4-5, most prominent on the right and posteriorly. Vertebral body heights are maintained. More subtle changes are present on the right at C6-7. Cord: Normal signal is present in the cervical and upper thoracic spinal cord. Cord morphology is preserved. Posterior Fossa, vertebral arteries, paraspinal tissues: Craniocervical junction is normal. Flow is present in the vertebral arteries  bilaterally. Visualized intracranial contents are normal. Disc levels: Previously noted ventral epidural collection on the right has expanded. There is diffuse enhancement associated with the collection. Collection is centered at C4-5 but extends superiorly to the C2 level. The collection extends inferiorly to C5-6. Abnormal enhancement extends to the C1-2 level on the right into C7 inferiorly. Abnormal signal and enhancement is present within the right neural foramina at C3-4, C4-5, and C5-6. Abnormal signal and enhancement is present about the C5-6 facet joint on the right. Dorsal epidural enhancement is present at C3-4 and C4-5. MRI THORACIC SPINE FINDINGS Alignment:  No significant listhesis is present. Vertebrae: Marrow signal is diffusely depressed. No discrete lesions are present. No pathologic enhancement is present. Conus medullaris and cauda equina: Conus extends to the L1 level. Conus and cauda equina appear normal. Paraspinal and other soft tissues: Paraspinous soft tissues are within normal limits. No enhancing soft tissue is present. Visualized lung fields are clear. Disc levels: No focal disc protrusion or stenosis is present. The central canal and foramina are patent throughout the thoracic spine. IMPRESSION: 1. Progressive enhancing epidural collection in the cervical spine centered at C4-5. Abnormal disc signal, endplate signal, and enhancement suggest disc osteomyelitis at this level. 2. Abnormal epidural fluid enhancement extending superiorly to C2-3 and inferiorly to C5. 3. Additional dural enhancement extends superiorly to the C1-2 level. 4. Subtle signal changes may represent early discitis at C6-7. 5. Cervical paraspinal disease on the right with enhancement of paraspinous soft tissues including paraspinous musculature consistent with diffuse infection. 6. Abnormal signal and enhancement within the right cervical foramina at C3-4, C4-5, and C5-6. Electronically Signed   By: CSan MorelleM.D.   On: 02/11/2020 17:16   MR THORACIC SPINE W WO CONTRAST  Result Date: 02/11/2020 CLINICAL DATA:  Question epidural abscess. Recent falls and weakness. Hyperglycemia. MSSA bacteremia. Osteomyelitis of the foot. Previous abnormal MRI of the cervical spine  with possible epidural abscess. EXAM: MRI CERVICAL AND LUMBAR SPINE WITHOUT AND WITH CONTRAST TECHNIQUE: Multiplanar and multiecho pulse sequences of the cervical spine, to include the craniocervical junction and cervicothoracic junction, and lumbar spine, were obtained without and with intravenous contrast. CONTRAST:  59m GADAVIST GADOBUTROL 1 MMOL/ML IV SOLN COMPARISON:  MRI of the cervical spine without contrast 02/01/2020. FINDINGS: MRI CERVICAL SPINE FINDINGS Alignment: Alignment is anatomic Vertebrae: Edema and enhancement is present at C4-5, most prominent on the right and posteriorly. Vertebral body heights are maintained. More subtle changes are present on the right at C6-7. Cord: Normal signal is present in the cervical and upper thoracic spinal cord. Cord morphology is preserved. Posterior Fossa, vertebral arteries, paraspinal tissues: Craniocervical junction is normal. Flow is present in the vertebral arteries bilaterally. Visualized intracranial contents are normal. Disc levels: Previously noted ventral epidural collection on the right has expanded. There is diffuse enhancement associated with the collection. Collection is centered at C4-5 but extends superiorly to the C2 level. The collection extends inferiorly to C5-6. Abnormal enhancement extends to the C1-2 level on the right into C7 inferiorly. Abnormal signal and enhancement is present within the right neural foramina at C3-4, C4-5, and C5-6. Abnormal signal and enhancement is present about the C5-6 facet joint on the right. Dorsal epidural enhancement is present at C3-4 and C4-5. MRI THORACIC SPINE FINDINGS Alignment:  No significant listhesis is present. Vertebrae: Marrow signal  is diffusely depressed. No discrete lesions are present. No pathologic enhancement is present. Conus medullaris and cauda equina: Conus extends to the L1 level. Conus and cauda equina appear normal. Paraspinal and other soft tissues: Paraspinous soft tissues are within normal limits. No enhancing soft tissue is present. Visualized lung fields are clear. Disc levels: No focal disc protrusion or stenosis is present. The central canal and foramina are patent throughout the thoracic spine. IMPRESSION: 1. Progressive enhancing epidural collection in the cervical spine centered at C4-5. Abnormal disc signal, endplate signal, and enhancement suggest disc osteomyelitis at this level. 2. Abnormal epidural fluid enhancement extending superiorly to C2-3 and inferiorly to C5. 3. Additional dural enhancement extends superiorly to the C1-2 level. 4. Subtle signal changes may represent early discitis at C6-7. 5. Cervical paraspinal disease on the right with enhancement of paraspinous soft tissues including paraspinous musculature consistent with diffuse infection. 6. Abnormal signal and enhancement within the right cervical foramina at C3-4, C4-5, and C5-6. Electronically Signed   By: CSan MorelleM.D.   On: 02/11/2020 17:16     LOS: 12 days   Time spent: More than 50% of that time was spent in counseling and/or coordination of care.  RAntonieta Pert MD [facility]  02/13/2020, 11:33 AM

## 2020-02-13 NOTE — Plan of Care (Signed)
  Problem: Health Behavior/Discharge Planning: Goal: Ability to manage health-related needs will improve Outcome: Progressing   Problem: Clinical Measurements: Goal: Will remain free from infection Outcome: Progressing   Problem: Activity: Goal: Risk for activity intolerance will decrease Outcome: Progressing   

## 2020-02-14 LAB — CBC WITH DIFFERENTIAL/PLATELET
Abs Immature Granulocytes: 0.05 10*3/uL (ref 0.00–0.07)
Basophils Absolute: 0.1 10*3/uL (ref 0.0–0.1)
Basophils Relative: 1 %
Eosinophils Absolute: 0.2 10*3/uL (ref 0.0–0.5)
Eosinophils Relative: 3 %
HCT: 31 % — ABNORMAL LOW (ref 39.0–52.0)
Hemoglobin: 9.5 g/dL — ABNORMAL LOW (ref 13.0–17.0)
Immature Granulocytes: 1 %
Lymphocytes Relative: 23 %
Lymphs Abs: 2.1 10*3/uL (ref 0.7–4.0)
MCH: 22.3 pg — ABNORMAL LOW (ref 26.0–34.0)
MCHC: 30.6 g/dL (ref 30.0–36.0)
MCV: 72.8 fL — ABNORMAL LOW (ref 80.0–100.0)
Monocytes Absolute: 0.7 10*3/uL (ref 0.1–1.0)
Monocytes Relative: 8 %
Neutro Abs: 5.8 10*3/uL (ref 1.7–7.7)
Neutrophils Relative %: 64 %
Platelets: 454 10*3/uL — ABNORMAL HIGH (ref 150–400)
RBC: 4.26 MIL/uL (ref 4.22–5.81)
RDW: 15.5 % (ref 11.5–15.5)
WBC: 8.9 10*3/uL (ref 4.0–10.5)
nRBC: 0 % (ref 0.0–0.2)

## 2020-02-14 LAB — GLUCOSE, CAPILLARY
Glucose-Capillary: 191 mg/dL — ABNORMAL HIGH (ref 70–99)
Glucose-Capillary: 178 mg/dL — ABNORMAL HIGH (ref 70–99)
Glucose-Capillary: 243 mg/dL — ABNORMAL HIGH (ref 70–99)
Glucose-Capillary: 204 mg/dL — ABNORMAL HIGH (ref 70–99)

## 2020-02-14 LAB — CULTURE, BLOOD (ROUTINE X 2)
Culture: NO GROWTH
Culture: NO GROWTH
Special Requests: ADEQUATE
Special Requests: ADEQUATE

## 2020-02-14 MED ORDER — SODIUM CHLORIDE 0.9 % IV BOLUS
1000.0000 mL | Freq: Once | INTRAVENOUS | Status: AC
  Administered 2020-02-14: 1000 mL via INTRAVENOUS

## 2020-02-14 NOTE — TOC Progression Note (Addendum)
Transition of Care [facility] - Progression Note    Patient Details  Name: Chris Bowman MRN: [id number] Date of Birth: [birth year]  [facility] CM/SW Contact  Okey Dupre Lazaro Arms, LCSW Phone Number: 02/14/2020, 11:19 AM  Clinical Narrative:  Patient information now shows that he has snow shows as having insurance - [employer] - [employer] - [id number]. Call made to April at the [facility] [phone], ext. [phone]) regarding patient. She indicated that patient has not gotten services from the [place] in a long time and provided CSW with name and number for [facility], ext. [phone]. Call made to Ms. Okibedi-Mott and message left regarding patient.   4:10 pm: Received call from Ms. Adaku-Okibedi-Mott. She reported that patient has not been to the [place] since 2008. CSW advised that every year the veteran has to do a means test, and right now he has a high co-pay, which is probably not accurate since he has [facility] with the [facility] in several years. Ms. Nelwyn Salisbury indicated that patient has to do new paperwork: [facility] 10-10EZR Medical form (means test) and form 1010 EC for extended care services. CSW advised: can [employer] to get forms, print off - don't complete online. She added that patient will also need a primary care team with the [facility] before going to rehab. She can be contacted at: [phone], ext. [phone] if any questions.     Expected Discharge Plan: Skilled Nursing Facility Barriers to Discharge: Continued Medical Work up  Expected Discharge Plan and Services Expected Discharge Plan: Skilled Nursing Facility In-house Referral: Clinical Social Work     Living arrangements for the past 2 months: Apartment                                     Social Determinants of Health (SDOH) Interventions     Readmission Risk Interventions No flowsheet data found.

## 2020-02-14 NOTE — Progress Notes (Signed)
PROGRESS NOTE    Chris Bowman  XQJ:[id number] DOB: 11-11-1961 DOA: 02/01/2020 PCP: Patient, No Pcp Per   Brief Narrative: 59 year old male with UNknown medical history, who has not seen a doctor since thousand 18 presented to the ER on 3/4 with weakness and falls x2 weeks and associated fever dyspnea cough, chronic buttock pain since the fall along with limitation of range of motion, right arm pain.  Apparently diagnosed with diabetes in thousand and but did not think he needed medicine for it, reported history of taking too many baby aspirin and having stomach pain" I have a little incision is scar from that" in the ED afebrile Covid negative WBC 20.4K glucose 300 A1c 11 sodium 120,patient was admitted under hospitalist.Work-up showed MSSA UTI, bacteremia bilateral foot osteomyelitis with associated sepsis and concern for cervical spine epidural abscess/phlegmon C3-C5 no cord compression, no evidence of discitis or osteomyelitis.Patient was seen by neurosurgery, infectious disease.Patient underwent TEE-negative for endocarditis, MRI spine discitis/osteo-/small phlegmon, is a status post I&D of the left toe, 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  Patient is on Ancef as per ID and will need 6 weeks of IV antibiotic, repeat blood culture on 3/6 and PICC line 3/10.On 3/8 urine retention needing in and out cath and subsequently needing Foley catheter. 3/11 temp spike 101 and 3/10 100.5. ID reconsulted and Blood cx/MRI c-t spine ordered.  Subjective: Seen this morning He was placed on bedside chair and complaint of dizziness.  Blood pressure was soft and 67. Gave him a liter bolus normal saline and blood pressure has stabilized. Overnight no fever. Denies nausea vomiting neck pain back pain focal weakness. Does not want to have the Foley taken out today.  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 S/p TEE negative for endocarditis, normal EF with tiny PFO with right to left shunt, status post I&D of ulcer on the left great toe, s/p TMA ray amputation of right foot with wound VAC placement 3/6-wound VAC out 3/11 and healing well on daily dry dressing. Neurosurgery recommends 6 weeks to 3 months and repeat MRI C-spine with and without contrast in 6 weeks or sooner if progressive myelopathy develops or if develops worse back pain will need MRI lumbar spine with and without contrast.repeat blood culture and ID eval 3/12-negative.  Repeat CAT MRI with and without contrast no extension into thoracic area, some expected progression this early in cervical area and no changes otherwise in treatment except IV antibiotics.I had discussed with neurosurgery PA Costella on-call 3/14 and notifed Dr. Marcello Moores from neurosurgery too. Continue Ancef plan for 6 weeks and reassess- he will need to follow-up with infectious disease.  Episode of hypotension: ?  Etiology likely from poor intake.  Gave him bolus normal saline 1 L with improvement in the blood pressure.  We will keep him on normal saline hydration for now.  CBC stable will order chemistry panel.  Multiple bowel movements, stopped laxatives, addedas needed Imodium. Improved  Scalene Myositis: Prevertebral edema with right-sided paravertebral fluid collections in the mid to lower cervical spine concerning for infection and abscesses with suspected myositis involving the right-sided scalene musculature noted on MRI. Case discussed with ENT Dr. Wilburn Cornelia who recommend antibiotic treatment,no surgical intervention needed  Urine retention continue supportive care.Placed on indwelling Foley- placed 3/8 when he had 700 ml urine on bladder scan.  Wants to keep the  Foley for now, he is reluctant to have it removed today we will try tomorrow.  AKI secondary to ATN in the setting of sepsis.No obstruction in the ultrasound.   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  Monitor renal function closely. Creat at stable at 1.2 monitor  Type 2 diabetes mellitus with hyperglycemia, without long-term current use of insulin: hba1c uncontrolled at 11.3.  Started on Levemir and changed to insulin 70/30,Sugar fairly controlled. Appreciate diabetic coordinator follow-up. monitor and adjust insulin.Marland Kitchen   Recent Labs  Lab 02/13/20 1127 02/13/20 1621 02/13/20 2018 02/14/20 0727 02/14/20 1122  GLUCAP 153* 197* 181* 191* 178*   Ulcer of left foot due to type 2 diabetes mellitus cont wound care  Hyponatremia: Likely from hypovolemia. Sodium improved,  Generalized weakness/frequent fall : Continue PT OT and will need a skilled nursing facility placement.  SW working on it. Discussed with Education officer, museum.  Seen by neurology sent neuropathy,  B1 B6 level, which are WNL, MMA slightly up at 403,.  Transaminitis with ALP elevation. AST/ALT also elevated mildly in 50s now, improving.Hepatitis panel negative.  Likely from sepsis.  Ultrasound shows cholelithiasis but no obstruction. Monitor.  Microcytic anemia B12 normal folate normal TSH normal iron panel, with elevated ferritin 543, iron low at 12, TIBC low at 174.  Delusional disorder/impaired memory MRI brain showed mild cerebral atrophy and chronic small vessel ischemia.Currently patient is alert awake oriented, follows commands.  Has some tangential thought process.  HIV screen negative. he reports he is feeling much more clear and able to think better.  He directs towards his sister for any kind of decision.  Nutrition: Nutrition Problem: Increased nutrient needs Etiology: wound healing Signs/Symptoms: estimated needs Interventions: Glucerna shake, MVI Body mass index is 30.1 kg/m.   DVT prophylaxis:SCD Code Status:full Family Communication: plan of care discussed with patient at bedside.updated sister over the phone and in person.She is working on placement- vs home w/ h, working  with Development worker, community. Disposition Plan: Patient is from:Home Anticipated Disposition: to skilled nursing facility Barriers to discharge or conditions that needs to be met prior to discharge: Patient admitted with MSSA sepsis foot osteomyelitis, cervical osteo/discitis, remains hospitalized for IV antibiotics.  Having intermittent fever completed MRI C and T-spine obtained and no extension of epidural abscess. Plan for SNF  for IV antibiotics x6 weeks or longer. Awaiting medicaid application-sister working with a Development worker, community, awaiting placement.  Consultants:ID, neurology, cardiology for TEE, neurosurgery, orthopedics  Procedures:  MRI C and T-spine with and without contrast 3/14 1. Progressive enhancing epidural collection in the cervical spine centered at C4-5. Abnormal disc signal, endplate signal, and enhancement suggest disc osteomyelitis at this level. 2. Abnormal epidural fluid enhancement extending superiorly to C2-3 and inferiorly to C5. 3. Additional dural enhancement extends superiorly to the C1-2 level. 4. Subtle signal changes may represent early discitis at C6-7. 5. Cervical paraspinal disease on the right with enhancement of paraspinous soft tissues including paraspinous musculature consistent with diffuse infection. 6. Abnormal signal and enhancement within the right cervical foramina at C3-4, C4-5, and C5-6.    Microbiology: Urine culture MSSA, blood culture MSSA, repeat BLOOD CX 3/6 - so far   Medications: Scheduled Meds: . Chlorhexidine Gluconate Cloth  6 each Topical Daily  . feeding supplement (GLUCERNA SHAKE)  237 mL Oral TID BM  . feeding supplement (PRO-STAT SUGAR FREE 64)  30 mL Oral BID  . insulin aspart  0-15 Units Subcutaneous TID WC  . insulin aspart  0-5 Units Subcutaneous QHS  .  insulin aspart protamine- aspart  8 Units Subcutaneous BID WC  . insulin starter kit- pen needles  1 kit Other Once  . multivitamin with minerals  1 tablet Oral  Daily  . tamsulosin  0.4 mg Oral Daily  . vitamin B-12  1,000 mcg Oral Daily   Continuous Infusions: . sodium chloride 100 mL/hr at 02/14/20 1149  .  ceFAZolin (ANCEF) IV 2 g (02/14/20 0517)  . lactated ringers Stopped (02/03/20 1146)  . methocarbamol (ROBAXIN) IV      Antimicrobials: Anti-infectives (From admission, onward)   Start     Dose/Rate Route Frequency Ordered Stop   02/03/20 1130  ceFAZolin (ANCEF) IVPB 1 g/50 mL premix  Status:  Discontinued     1 g 100 mL/hr over 30 Minutes Intravenous Every 6 hours 02/03/20 1124 02/03/20 1139   02/02/20 1100  vancomycin (VANCOREADY) IVPB 750 mg/150 mL  Status:  Discontinued     750 mg 150 mL/hr over 60 Minutes Intravenous Every 24 hours 02/01/20 1218 02/02/20 0221   02/02/20 0600  ceFAZolin (ANCEF) IVPB 2g/100 mL premix     2 g 200 mL/hr over 30 Minutes Intravenous Every 8 hours 02/02/20 0221     02/01/20 2230  ceFEPIme (MAXIPIME) 2 g in sodium chloride 0.9 % 100 mL IVPB  Status:  Discontinued     2 g 200 mL/hr over 30 Minutes Intravenous Every 12 hours 02/01/20 1218 02/02/20 0221   02/01/20 1200  metroNIDAZOLE (FLAGYL) IVPB 500 mg  Status:  Discontinued     500 mg 100 mL/hr over 60 Minutes Intravenous Every 8 hours 02/01/20 1149 02/02/20 0221   02/01/20 1015  ceFEPIme (MAXIPIME) 2 g in sodium chloride 0.9 % 100 mL IVPB     2 g 200 mL/hr over 30 Minutes Intravenous  Once 02/01/20 1008 02/01/20 1056   02/01/20 1015  vancomycin (VANCOREADY) IVPB 1750 mg/350 mL     1,750 mg 175 mL/hr over 120 Minutes Intravenous  Once 02/01/20 1008 02/01/20 1336     Objective: Vitals: Today's Vitals   02/14/20 0429 02/14/20 0738 02/14/20 0854 02/14/20 1015  BP: 130/78  (!) 67/46 (!) 149/76  Pulse: 84  86 86  Resp: 18  18   Temp: 97.9 F (36.6 C)  97.7 F (36.5 C)   TempSrc: Oral  Oral   SpO2: 96%  98%   Weight:      Height:      PainSc:  0-No pain      Intake/Output Summary (Last 24 hours) at 02/14/2020 1331 Last data filed at  02/14/2020 1200 Gross per 24 hour  Intake 1580 ml  Output 1100 ml  Net 480 ml   Filed Weights   02/09/20 2101 02/10/20 2038 02/11/20 2127  Weight: 89.8 kg 89.8 kg 89.8 kg   Weight change:    Intake/Output from previous day: 03/16 0701 - 03/17 0700 In: 8469 [P.O.:1380; IV Piggyback:200] Out: 1400 [Urine:1400] Intake/Output this shift: Total I/O In: 600 [P.O.:600] Out: 450 [Urine:450]  Examination:  General exam: Alert awake oriented, not in acute distress, mildly lightheaded.   HEENT:Oral mucosa moist, Ear/Nose WNL grossly, dentition normal. Respiratory system: bilaterally clear,no wheezing or crackles,no use of accessory muscle Cardiovascular system: S1 & S2 +, No JVD,. Gastrointestinal system: Abdomen soft, NT,ND, BS+ Nervous System:Alert, awake, moving extremities and grossly nonfocal Extremities: No edema, distal peripheral pulses palpable.Right foot with intact dressing,  Skin: No rashes,no icterus. MSK: Normal muscle bulk,tone, power.  Data Reviewed:I have personally reviewed following labs and imaging  studies. CBC: Recent Labs  Lab 02/10/20 0404 02/11/20 0437 02/12/20 0300 02/13/20 0335 02/14/20 0201  WBC 11.1* 12.6* 10.8* 10.1 8.9  NEUTROABS 8.1* 9.1* 6.9 6.3 5.8  HGB 9.0* 8.8* 9.1* 9.0* 9.5*  HCT 28.9* 28.2* 29.7* 29.2* 31.0*  MCV 71.7* 72.1* 73.0* 71.9* 72.8*  PLT 447* 422* 457* 442* 182*   Basic Metabolic Panel: Recent Labs  Lab 02/08/20 0430 02/09/20 0458 02/10/20 0404 02/13/20 0335  NA 129* 131* 131* 132*  K 4.5 4.5 4.2 4.5  CL 93* 96* 97* 98  CO2 24 23 24 24   GLUCOSE 188* 205* 191* 215*  BUN 27* 31* 32* 31*  CREATININE 1.36* 1.27* 1.21 1.27*  CALCIUM 8.1* 8.5* 8.5* 8.5*   GFR: Estimated Creatinine Clearance: 69.9 mL/min (A) (by C-G formula based on SCr of 1.27 mg/dL (H)). Liver Function Tests: Recent Labs  Lab 02/08/20 0430 02/09/20 0458 02/10/20 0404 02/13/20 0335  AST 171* 102* 56* 31  ALT 102* 78* 50* 24  ALKPHOS 256* 246*  208* 160*  BILITOT 0.3 0.2* 0.3 0.3  PROT 6.2* 6.5 6.5 6.8  ALBUMIN 1.7* 1.8* 1.8* 1.8*   No results for input(s): LIPASE, AMYLASE in the last 168 hours. No results for input(s): AMMONIA in the last 168 hours. Coagulation Profile: No results for input(s): INR, PROTIME in the last 168 hours. Cardiac Enzymes: No results for input(s): CKTOTAL, CKMB, CKMBINDEX, TROPONINI in the last 168 hours. BNP (last 3 results) No results for input(s): PROBNP in the last 8760 hours. HbA1C: No results for input(s): HGBA1C in the last 72 hours. CBG: Recent Labs  Lab 02/13/20 1127 02/13/20 1621 02/13/20 2018 02/14/20 0727 02/14/20 1122  GLUCAP 153* 197* 181* 191* 178*   Lipid Profile: No results for input(s): CHOL, HDL, LDLCALC, TRIG, CHOLHDL, LDLDIRECT in the last 72 hours. Thyroid Function Tests: No results for input(s): TSH, T4TOTAL, FREET4, T3FREE, THYROIDAB in the last 72 hours. Anemia Panel: No results for input(s): VITAMINB12, FOLATE, FERRITIN, TIBC, IRON, RETICCTPCT in the last 72 hours. Sepsis Labs: No results for input(s): PROCALCITON, LATICACIDVEN in the last 168 hours.  Recent Results (from the past 240 hour(s))  Culture, blood (routine x 2)     Status: None   Collection Time: 02/09/20  1:38 PM   Specimen: BLOOD  Result Value Ref Range Status   Specimen Description BLOOD LEFT ANTECUBITAL  Final   Special Requests AEROBIC BOTTLE ONLY Blood Culture adequate volume  Final   Culture   Final    NO GROWTH 5 DAYS Performed at [facility], [address] [place], [place] [address]    Report Status 02/14/2020 FINAL  Final  Culture, blood (routine x 2)     Status: None   Collection Time: 02/09/20  1:39 PM   Specimen: BLOOD LEFT HAND  Result Value Ref Range Status   Specimen Description BLOOD LEFT HAND  Final   Special Requests   Final    BOTTLES DRAWN AEROBIC AND ANAEROBIC Blood Culture adequate volume   Culture   Final    NO GROWTH 5 DAYS Performed at [place], [place] [address], [place], [place] [address]    Report Status 02/14/2020 FINAL  Final      Radiology Studies: No results found.  LOS: 13 days  Time spent: More than 50% of that time was spent in counseling and/or coordination of care.  Antonieta Pert, MD [facility]  02/14/2020, 1:31 PM

## 2020-02-14 NOTE — Plan of Care (Signed)
  Problem: Health Behavior/Discharge Planning: Goal: Ability to manage health-related needs will improve Outcome: Progressing   

## 2020-02-15 LAB — CBC WITH DIFFERENTIAL/PLATELET
Abs Immature Granulocytes: 0.04 10*3/uL (ref 0.00–0.07)
Basophils Absolute: 0.1 10*3/uL (ref 0.0–0.1)
Basophils Relative: 1 %
Eosinophils Absolute: 0.3 10*3/uL (ref 0.0–0.5)
Eosinophils Relative: 3 %
HCT: 28.3 % — ABNORMAL LOW (ref 39.0–52.0)
Hemoglobin: 8.8 g/dL — ABNORMAL LOW (ref 13.0–17.0)
Immature Granulocytes: 1 %
Lymphocytes Relative: 30 %
Lymphs Abs: 2.5 10*3/uL (ref 0.7–4.0)
MCH: 22.8 pg — ABNORMAL LOW (ref 26.0–34.0)
MCHC: 31.1 g/dL (ref 30.0–36.0)
MCV: 73.3 fL — ABNORMAL LOW (ref 80.0–100.0)
Monocytes Absolute: 0.7 10*3/uL (ref 0.1–1.0)
Monocytes Relative: 9 %
Neutro Abs: 4.6 10*3/uL (ref 1.7–7.7)
Neutrophils Relative %: 56 %
Platelets: 410 10*3/uL — ABNORMAL HIGH (ref 150–400)
RBC: 3.86 MIL/uL — ABNORMAL LOW (ref 4.22–5.81)
RDW: 15.4 % (ref 11.5–15.5)
WBC: 8.2 10*3/uL (ref 4.0–10.5)
nRBC: 0 % (ref 0.0–0.2)

## 2020-02-15 LAB — COMPREHENSIVE METABOLIC PANEL
ALT: 17 U/L (ref 0–44)
AST: 23 U/L (ref 15–41)
Albumin: 1.9 g/dL — ABNORMAL LOW (ref 3.5–5.0)
Alkaline Phosphatase: 154 U/L — ABNORMAL HIGH (ref 38–126)
Anion gap: 9 (ref 5–15)
BUN: 31 mg/dL — ABNORMAL HIGH (ref 6–20)
CO2: 22 mmol/L (ref 22–32)
Calcium: 8.5 mg/dL — ABNORMAL LOW (ref 8.9–10.3)
Chloride: 103 mmol/L (ref 98–111)
Creatinine, Ser: 1.2 mg/dL (ref 0.61–1.24)
GFR calc Af Amer: >60 mL/min (ref >60)
GFR calc non Af Amer: >60 mL/min (ref >60)
Glucose, Bld: 195 mg/dL — ABNORMAL HIGH (ref 70–99)
Potassium: 4.3 mmol/L (ref 3.5–5.1)
Sodium: 134 mmol/L — ABNORMAL LOW (ref 135–145)
Total Bilirubin: 0.2 mg/dL — ABNORMAL LOW (ref 0.3–1.2)
Total Protein: 6.3 g/dL — ABNORMAL LOW (ref 6.5–8.1)

## 2020-02-15 LAB — GLUCOSE, CAPILLARY
Glucose-Capillary: 191 mg/dL — ABNORMAL HIGH (ref 70–99)
Glucose-Capillary: 143 mg/dL — ABNORMAL HIGH (ref 70–99)
Glucose-Capillary: 205 mg/dL — ABNORMAL HIGH (ref 70–99)
Glucose-Capillary: 196 mg/dL — ABNORMAL HIGH (ref 70–99)

## 2020-02-15 MED ORDER — SODIUM CHLORIDE 0.9 % IV BOLUS
500.0000 mL | Freq: Once | INTRAVENOUS | Status: AC
  Administered 2020-02-15: 500 mL via INTRAVENOUS

## 2020-02-15 NOTE — Progress Notes (Signed)
Spoke with Patient nurse regarding AD.  Nurse gave patient document and will notify spiritual care when ready.  Will follow as needed.   Venida Jarvis, [place], [facility], Pager [phone]

## 2020-02-15 NOTE — Progress Notes (Signed)
PROGRESS NOTE    Raj Landress  GQB:[id number] DOB: [birth year] DOA: 02/01/2020 PCP: Patient, No Pcp Per   Brief Narrative: 59 year old male with UNknown medical history, who has not seen a doctor since thousand 18 presented to the ER on 3/4 with weakness and falls x2 weeks and associated fever dyspnea cough, chronic buttock pain since the fall along with limitation of range of motion, right arm pain.  Apparently diagnosed with diabetes in thousand and but did not think he needed medicine for it, reported history of taking too many baby aspirin and having stomach pain" I have a little incision is scar from that" in the ED afebrile Covid negative WBC 20.4K glucose 300 A1c 11 sodium 120,patient was admitted under hospitalist.Work-up showed MSSA UTI, bacteremia bilateral foot osteomyelitis with associated sepsis and concern for cervical spine epidural abscess/phlegmon C3-C5 no cord compression, no evidence of discitis or osteomyelitis.Patient was seen by neurosurgery, infectious disease.Patient underwent TEE-negative for endocarditis, MRI spine discitis/osteo-/small phlegmon, is a status post I&D of the left toe, 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  Patient is on Ancef as per ID and will need 6 weeks of IV antibiotic, repeat blood culture on 3/6 and PICC line 3/10.On 3/8 urine retention needing in and out cath and subsequently needing Foley catheter. 3/11 temp spike 101 and 3/10 100.5. ID reconsulted and Blood cx/MRI c-t spine ordered.  Subjective:  bp 84 from 113 on standing this am and mildly dizzy. Agrees for foley removal today No acute events overnight.  Remains afebrile-T-max 99.3 3/17-patient had of hypertension visual with bolus normal saline. WBC count stable at 8.2K.  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 S/p TEE negative for endocarditis,  normal EF with tiny PFO with right to left shunt, status post I&D of ulcer on the left great toe, s/p TMA ray amputation of right foot with wound VAC placement 3/6-wound VAC out 3/11 and healing well on daily dry dressing. Neurosurgery recommends 6 weeks to 3 months and repeat MRI C-spine with and without contrast in 6 weeks or sooner if progressive myelopathy develops or if develops worse back pain will need MRI lumbar spine with and without contrast.repeat blood culture and ID eval 3/12-negative.  Repeat CAT MRI with and without contrast no extension into thoracic area, some expected progression this early in cervical area and no changes otherwise in treatment except IV antibiotics.I had discussed with neurosurgery PA Costella on-call 3/14 and notifed Dr. Marcello Moores from neurosurgery too. Continue Ancef plan for 6 weeks and reassess- so, he will need to follow-up with infectious disease.  Episode of hypotension: ?  Etiology likely from poor intake and orthostatic this morning.  Will order 500 mill bolus again.  Continue on normal saline hydration. Monitor.   Multiple bowel movements, stopped laxatives, addedas needed Imodium. Improved  Scalene Myositis: Prevertebral edema with right-sided paravertebral fluid collections in the mid to lower cervical spine concerning for infection and abscesses with suspected myositis involving the right-sided scalene musculature noted on MRI. Case discussed with ENT Dr. Wilburn Cornelia who recommend antibiotic treatment,no surgical intervention needed  Urine retention continue supportive care.Placed on indwelling Foley- placed 3/8 when he had 700 ml urine on bladder scan.  Will attempt to remove Foley catheter today perform voiding trial. This willl be his 2nd trial  AKI secondary to ATN in the setting of sepsis.No obstruction in the ultrasound.  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  Monitor renal function closely. Creat at stable at 1.2 monitor. Recent Labs  Lab  02/09/20 0458 02/10/20 0404 02/13/20 0335 02/15/20 0325  BUN 31* 32* 31* 31*  CREATININE 1.27* 1.21 1.27* 1.20    Type 2 diabetes mellitus with hyperglycemia, without long-term current use of insulin: hba1c uncontrolled at 11.3.  Started on Levemir and changed to insulin 70/30,Sugar fairly controlled. Appreciate diabetic coordinator follow-up. monitor and adjust insulin.Marland Kitchen   Recent Labs  Lab 02/14/20 0727 02/14/20 1122 02/14/20 1712 02/14/20 2035 02/15/20 0729  GLUCAP 191* 178* 243* 204* 191*   Ulcer of left foot due to type 2 diabetes mellitus cont wound care  Hyponatremia: Likely from hypovolemia. Sodium improved,  Generalized weakness/frequent fall : Continue PT OT and will need a skilled nursing facility placement.  SW working on it. Discussed with Education officer, museum.  Seen by neurology sent neuropathy,  B1 B6 level, which are WNL, MMA slightly up at 403,.  Transaminitis with ALP elevation. AST/ALT also elevated mildly in 50s now, improving.Hepatitis panel negative.  Likely from sepsis.  Ultrasound shows cholelithiasis but no obstruction. Monitor.  Microcytic anemia B12 normal folate normal TSH normal iron panel, with elevated ferritin 543, iron low at 12, TIBC low at 174.  Delusional disorder/impaired memory MRI brain showed mild cerebral atrophy and chronic small vessel ischemia.Currently patient is alert awake oriented, follows commands.  Has some tangential thought process.  HIV screen negative. he reports he is feeling much more clear and able to think better.  He directs towards his sister for any kind of decision.  Nutrition: Nutrition Problem: Increased nutrient needs Etiology: wound healing Signs/Symptoms: estimated needs Interventions: Glucerna shake, MVI Body mass index is 30.1 kg/m.   DVT prophylaxis:SCD Code Status:full Family Communication: plan of care discussed with patient at bedside.I had updated sister over the phone and in person.She is working on placement,  working with Development worker, community. Disposition Plan: Patient is from:Home Anticipated Disposition: to skilled nursing facility Barriers to discharge or conditions that needs to be met prior to discharge: Patient admitted with MSSA sepsis foot osteomyelitis, cervical osteo/discitis, remains hospitalized for IV antibiotics.  Having intermittent fever completed MRI C and T-spine obtained and no extension of epidural abscess. Plan for SNF  for IV antibiotics x6 weeks or longer. Awaiting medicaid application-sister working with a Development worker, community, awaiting placement.  Consultants:ID, neurology, cardiology for TEE, neurosurgery, orthopedics  Procedures:  MRI C and T-spine with and without contrast 3/14 1. Progressive enhancing epidural collection in the cervical spine centered at C4-5. Abnormal disc signal, endplate signal, and enhancement suggest disc osteomyelitis at this level. 2. Abnormal epidural fluid enhancement extending superiorly to C2-3 and inferiorly to C5. 3. Additional dural enhancement extends superiorly to the C1-2 level. 4. Subtle signal changes may represent early discitis at C6-7. 5. Cervical paraspinal disease on the right with enhancement of paraspinous soft tissues including paraspinous musculature consistent with diffuse infection. 6. Abnormal signal and enhancement within the right cervical foramina at C3-4, C4-5, and C5-6.    Microbiology: Urine culture MSSA, blood culture MSSA, repeat BLOOD CX 3/6 - so far   Medications: Scheduled Meds: . Chlorhexidine Gluconate Cloth  6 each Topical Daily  . feeding supplement (GLUCERNA SHAKE)  237 mL Oral TID BM  . feeding supplement (PRO-STAT SUGAR FREE 64)  30 mL Oral BID  . insulin aspart  0-15 Units Subcutaneous TID WC  . insulin aspart  0-5 Units Subcutaneous QHS  . insulin aspart protamine- aspart  8  Units Subcutaneous BID WC  . insulin starter kit- pen needles  1 kit Other Once  . multivitamin with minerals  1  tablet Oral Daily  . tamsulosin  0.4 mg Oral Daily  . vitamin B-12  1,000 mcg Oral Daily   Continuous Infusions: . sodium chloride 100 mL/hr at 02/14/20 2249  .  ceFAZolin (ANCEF) IV 2 g (02/15/20 0520)  . lactated ringers Stopped (02/03/20 1146)  . methocarbamol (ROBAXIN) IV      Antimicrobials: Anti-infectives (From admission, onward)   Start     Dose/Rate Route Frequency Ordered Stop   02/03/20 1130  ceFAZolin (ANCEF) IVPB 1 g/50 mL premix  Status:  Discontinued     1 g 100 mL/hr over 30 Minutes Intravenous Every 6 hours 02/03/20 1124 02/03/20 1139   02/02/20 1100  vancomycin (VANCOREADY) IVPB 750 mg/150 mL  Status:  Discontinued     750 mg 150 mL/hr over 60 Minutes Intravenous Every 24 hours 02/01/20 1218 02/02/20 0221   02/02/20 0600  ceFAZolin (ANCEF) IVPB 2g/100 mL premix     2 g 200 mL/hr over 30 Minutes Intravenous Every 8 hours 02/02/20 0221     02/01/20 2230  ceFEPIme (MAXIPIME) 2 g in sodium chloride 0.9 % 100 mL IVPB  Status:  Discontinued     2 g 200 mL/hr over 30 Minutes Intravenous Every 12 hours 02/01/20 1218 02/02/20 0221   02/01/20 1200  metroNIDAZOLE (FLAGYL) IVPB 500 mg  Status:  Discontinued     500 mg 100 mL/hr over 60 Minutes Intravenous Every 8 hours 02/01/20 1149 02/02/20 0221   02/01/20 1015  ceFEPIme (MAXIPIME) 2 g in sodium chloride 0.9 % 100 mL IVPB     2 g 200 mL/hr over 30 Minutes Intravenous  Once 02/01/20 1008 02/01/20 1056   02/01/20 1015  vancomycin (VANCOREADY) IVPB 1750 mg/350 mL     1,750 mg 175 mL/hr over 120 Minutes Intravenous  Once 02/01/20 1008 02/01/20 1336     Objective: Vitals: Today's Vitals   02/14/20 1015 02/14/20 1811 02/14/20 2034 02/15/20 0314  BP: (!) 149/76 124/66 (!) 143/82 (!) 147/88  Pulse: 86 91 91 87  Resp:  18  18  Temp:  98.6 F (37 C) 99.3 F (37.4 C) 98 F (36.7 C)  TempSrc:  Oral Oral Oral  SpO2:  97% 95% 96%  Weight:      Height:      PainSc:   0-No pain     Intake/Output Summary (Last 24 hours)  at 02/15/2020 0757 Last data filed at 02/15/2020 0600 Gross per 24 hour  Intake 2682.2 ml  Output 3001 ml  Net -318.8 ml   Filed Weights   02/09/20 2101 02/10/20 2038 02/11/20 2127  Weight: 89.8 kg 89.8 kg 89.8 kg   Weight change:    Intake/Output from previous day: 03/17 0701 - 03/18 0700 In: 2682.2 [P.O.:1380; I.V.:1102.2; IV Piggyback:200] Out: 3001 [Urine:3000; Stool:1] Intake/Output this shift: No intake/output data recorded.  Examination:  General exam: Alert awake oriented X3, not in acute distress.  On room air.   HEENT:Oral mucosa moist, Ear/Nose WNL grossly, dentition normal. Respiratory system: B/L Clear, no wheezing or crackles,no use of accessory muscle Cardiovascular system: S1 & S2 +, No JVD. Gastrointestinal system: Abdomen soft, NT,ND, BS+. Nervous System:Alert, awake, nonfocal on exam intact sensation.   Extremities: No edema, dressing intact on the right foot,  Skin: No rashes,no icterus. MSK: Normal muscle bulk,tone, power. Data Reviewed:I have personally reviewed following labs and imaging studies.  CBC: Recent Labs  Lab 02/11/20 0437 02/12/20 0300 02/13/20 0335 02/14/20 0201 02/15/20 0325  WBC 12.6* 10.8* 10.1 8.9 8.2  NEUTROABS 9.1* 6.9 6.3 5.8 4.6  HGB 8.8* 9.1* 9.0* 9.5* 8.8*  HCT 28.2* 29.7* 29.2* 31.0* 28.3*  MCV 72.1* 73.0* 71.9* 72.8* 73.3*  PLT 422* 457* 442* 454* 964*   Basic Metabolic Panel: Recent Labs  Lab 02/09/20 0458 02/10/20 0404 02/13/20 0335 02/15/20 0325  NA 131* 131* 132* 134*  K 4.5 4.2 4.5 4.3  CL 96* 97* 98 103  CO2 _0 GLUCOSE 205* 191* 215* 195*  BUN 31* 32* 31* 31*  CREATININE 1.27* 1.21 1.27* 1.20  CALCIUM 8.5* 8.5* 8.5* 8.5*   GFR: Estimated Creatinine Clearance: 74 mL/min (by C-G formula based on SCr of 1.2 mg/dL). Liver Function Tests: Recent Labs  Lab 02/09/20 0458 02/10/20 0404 02/13/20 0335 02/15/20 0325  AST 102* 56* 31 23  ALT 78* 50* 24 17  ALKPHOS 246* 208* 160* 154*  BILITOT  0.2* 0.3 0.3 0.2*  PROT 6.5 6.5 6.8 6.3*  ALBUMIN 1.8* 1.8* 1.8* 1.9*   No results for input(s): LIPASE, AMYLASE in the last 168 hours. No results for input(s): AMMONIA in the last 168 hours. Coagulation Profile: No results for input(s): INR, PROTIME in the last 168 hours. Cardiac Enzymes: No results for input(s): CKTOTAL, CKMB, CKMBINDEX, TROPONINI in the last 168 hours. BNP (last 3 results) No results for input(s): PROBNP in the last 8760 hours. HbA1C: No results for input(s): HGBA1C in the last 72 hours. CBG: Recent Labs  Lab 02/14/20 0727 02/14/20 1122 02/14/20 1712 02/14/20 2035 02/15/20 0729  GLUCAP 191* 178* 243* 204* 191*   Lipid Profile: No results for input(s): CHOL, HDL, LDLCALC, TRIG, CHOLHDL, LDLDIRECT in the last 72 hours. Thyroid Function Tests: No results for input(s): TSH, T4TOTAL, FREET4, T3FREE, THYROIDAB in the last 72 hours. Anemia Panel: No results for input(s): VITAMINB12, FOLATE, FERRITIN, TIBC, IRON, RETICCTPCT in the last 72 hours. Sepsis Labs: No results for input(s): PROCALCITON, LATICACIDVEN in the last 168 hours.  Recent Results (from the past 240 hour(s))  Culture, blood (routine x 2)     Status: None   Collection Time: 02/09/20  1:38 PM   Specimen: BLOOD  Result Value Ref Range Status   Specimen Description BLOOD LEFT ANTECUBITAL  Final   Special Requests AEROBIC BOTTLE ONLY Blood Culture adequate volume  Final   Culture   Final    NO GROWTH 5 DAYS Performed at [facility], [address] [place], [place] [address]    Report Status 02/14/2020 FINAL  Final  Culture, blood (routine x 2)     Status: None   Collection Time: 02/09/20  1:39 PM   Specimen: BLOOD LEFT HAND  Result Value Ref Range Status   Specimen Description BLOOD LEFT HAND  Final   Special Requests   Final    BOTTLES DRAWN AEROBIC AND ANAEROBIC Blood Culture adequate volume   Culture   Final    NO GROWTH 5 DAYS Performed at [facility], [place] [address], [place], [place] [address]    Report Status 02/14/2020 FINAL  Final      Radiology Studies: No results found.  LOS: 14 days  Time spent: More than 50% of that time was spent in counseling and/or coordination of care.  Antonieta Pert, MD [facility]  02/15/2020, 7:57 AM

## 2020-02-15 NOTE — Progress Notes (Signed)
Physical Therapy Treatment Patient Details Name: Chris Bowman MRN: [id number] DOB: July 09, 1961 Today's Date: 02/15/2020    History of Present Illness 59 y.o. male was admitted with bacteremia and received TEE to rule out endocarditis causing sepsis.  His R foot received transmet amp on 02/03/20 and L great toe wound I and D. PMHx:  uncontrolled diabetes, essential HTN, delusional disorders. bilateral feet wounds with early indication of osteomyelitis per MRI results from 3/4    PT Comments    Pt progressing with therapy.  Required increased time for cues and for orthostatic BP assessment.  Pt did have slight drop in BP but was asymptomatic (see vitals below).  Pt prefers step by step instructions with clear plan from therapy for transfers.  Does well with NWB for short distance.  Remains unsteady - cont to recommend SNF>   Follow Up Recommendations  SNF     Equipment Recommendations  None recommended by PT    Recommendations for Other Services       Precautions / Restrictions Precautions Precautions: Fall;Other (comment) Precaution Comments: picc line, R foot ray amputation - NWB Required Braces or Orthoses: Other Brace Other Brace: R post-op shoe Restrictions RLE Weight Bearing: Non weight bearing LLE Weight Bearing: Weight bearing as tolerated    Mobility  Bed Mobility Overal bed mobility: Needs Assistance Bed Mobility: Supine to Sit     Supine to sit: Min guard     General bed mobility comments: Supervision for safety and cues for sequencing.   Transfers Overall transfer level: Needs assistance Equipment used: Rolling walker (2 wheeled) Transfers: Sit to/from [employer] Sit to Stand: Mod assist;From elevated surface Stand pivot transfers: Min assist;From elevated surface       General transfer comment: cues for safe hand placement for sit to stand and for use of RW with stand pivot.  Pt used momentum to assist with  transfers.  Ambulation/Gait Ambulation/Gait assistance: Min assist Gait Distance (Feet): 3 Feet Assistive device: Rolling walker (2 wheeled)       General Gait Details: hopped on L foot to chair with min a for balance and cues for RW; fatigued easily   Stairs             Wheelchair Mobility    Modified Rankin (Stroke Patients Only)       Balance Overall balance assessment: Needs assistance Sitting-balance support: No upper extremity supported;Feet supported Sitting balance-Leahy Scale: Good     Standing balance support: Bilateral upper extremity supported Standing balance-Leahy Scale: Poor                              Cognition Arousal/Alertness: Awake/alert Behavior During Therapy: WFL for tasks assessed/performed;Anxious Overall Cognitive Status: No family/caregiver present to determine baseline cognitive functioning                                 General Comments: pt is anxious, more comfortable with one step commands, appreciates information regarding the purpose of each activity/steps      Exercises      General Comments General comments (skin integrity, edema, etc.): Pt had orthostatic BP with OT this am but has received bolus of fluid since that time and is ready to attempt OOB activity.  Orthostatic BP as follows: Supine 143/73, Sit 126/63, stand 115/62.  Pt asymptomatic.      Pertinent Vitals/Pain Pain Assessment: No/denies  pain    Home Living                      Prior Function            PT Goals (current goals can now be found in the care plan section) Progress towards PT goals: Progressing toward goals    Frequency    Min 2X/week      PT Plan Frequency needs to be updated    Co-evaluation              AM-PAC PT "6 Clicks" Mobility   Outcome Measure  Help needed turning from your back to your side while in a flat bed without using bedrails?: A Little Help needed moving from lying on  your back to sitting on the side of a flat bed without using bedrails?: A Little Help needed moving to and from a bed to a chair (including a wheelchair)?: A Little Help needed standing up from a chair using your arms (e.g., wheelchair or bedside chair)?: A Lot Help needed to walk in hospital room?: A Lot Help needed climbing 3-5 steps with a railing? : Total 6 Click Score: 14    End of Session Equipment Utilized During Treatment: Gait belt Activity Tolerance: Patient tolerated treatment well Patient left: in chair;with call bell/phone within reach;with chair alarm set Nurse Communication: Mobility status PT Visit Diagnosis: Unsteadiness on feet (R26.81);Muscle weakness (generalized) (M62.81);Difficulty in walking, not elsewhere classified (R26.2);Adult, failure to thrive (R62.7);Repeated falls (R29.6);Pain Pain - Right/Left: Right Pain - part of body: Ankle and joints of foot     Time: 1240-1307 PT Time Calculation (min) (ACUTE ONLY): 27 min  Charges:  $Gait Training: 8-22 mins $Therapeutic Activity: 8-22 mins                     Maggie Font, PT Acute Rehab Services Pager [phone] [facility] [phone]    [name] 02/15/2020, 2:06 PM

## 2020-02-15 NOTE — TOC Progression Note (Signed)
Transition of Care [facility]) - Progression Note    Patient Details  Name: Chris Bowman MRN: [id number] Date of Birth: [birth year]  Transition of Care [facility]) CM/SW Contact  Terrilee Croak, Student-Social Work Phone Number: 02/15/2020, 12:08 PM  Clinical Narrative:    MSW Intern spoke with pt's sister Chris Bowman regarding the paperwork that the [place] needs to reinstate the pt. She noted understanding and asked about the paperwork. MSW Intern printed off paperwork and delivered it to bedside, so she and her brother can fill it out. Information to the [place] contact was given. She also stated that she needs her POA paperwork filled out, but cannot get in contact with Chaplain services. MSW Intern messaged pt's nurse to ask for chaplain services to be re- consulted. SW will continue to follow.   Expected Discharge Plan: Skilled Nursing Facility Barriers to Discharge: Continued Medical Work up  Expected Discharge Plan and Services Expected Discharge Plan: Skilled Nursing Facility In-house Referral: Clinical Social Work     Living arrangements for the past 2 months: Apartment                                       Social Determinants of Health (SDOH) Interventions    Readmission Risk Interventions No flowsheet data found.

## 2020-02-15 NOTE — Progress Notes (Signed)
Occupational Therapy Treatment Patient Details Name: Chris Bowman MRN: [id number] DOB: [birth year] Today's Date: 02/15/2020    History of present illness 59 y.o. male was admitted with bacteremia and received TEE to rule out endocarditis causing sepsis.  His R foot received transmet amp on 02/03/20 and L great toe wound I and D.  Has vac on R foot now, referred to PT for mobility.  PMHx:  uncontrolled diabetes, essential HTN, delusional disorders. bilateral feet wounds with early indication of osteomyelitis per MRI results from 3/4   OT comments  Pt progressing with OT goals. Pt received in bed and agreeable to participate in therapy. Pt conversing throughout session (anxious?). Time spent answering pt's questions/concerns and educating on safety precautions in regards to overall rehab care. Pt Supervision for bed mobility supine <> sit with HOB elevated and increased time. Pt complete one sit to stand trial with RW and elevated bed at Max A x 1 person. Assessed BP readings throughout transitional movements with orthostatic hypotension signs noted (135/77 at rest, 113/60 sitting EOB, 84/55 standing 1 min with RW). Pt able to demonstrate donning/doffing of L tennis shoe with figure four position EOB. Provided education and instruction on post op shoe mgmt. Pt reports need for BM, but declined to attempt [facility] transfer with OT at this - provided education on benefits and plans to assess during next session. OT assisted pt with bedpan placement per pt request, though pt reports he is able to remove/replace bedpan prn. Multiple times during session, pt reports need to speak with "trauma counselor". Provided pt encouragement and notified staff. Continue to recommend SNF for short term rehab. Will continue to follow acutely.    Follow Up Recommendations  SNF    Equipment Recommendations  3 in 1 bedside commode    Recommendations for Other Services      Precautions / Restrictions Precautions Precautions:  Fall;Other (comment) Precaution Comments: picc line, R foot ray amputation - NWB Required Braces or Orthoses: Other Brace Other Brace: R post-op shoe Restrictions Weight Bearing Restrictions: Yes RLE Weight Bearing: Non weight bearing LLE Weight Bearing: Weight bearing as tolerated Other Position/Activity Restrictions: ortho shoe R foot       Mobility Bed Mobility Overal bed mobility: Needs Assistance Bed Mobility: Supine to Sit;Sit to Supine Rolling: Supervision   Supine to sit: Supervision Sit to supine: Supervision   General bed mobility comments: Supervision for safety and cues for sequencing.   Transfers Overall transfer level: Needs assistance Equipment used: Rolling walker (2 wheeled) Transfers: Sit to/from Stand Sit to Stand: Max assist;From elevated surface         General transfer comment: Max A for initial sit to stand with RW to assess BP. Cued for use of momentum to assist in task    Balance Overall balance assessment: Needs assistance Sitting-balance support: Single extremity supported;Bilateral upper extremity supported Sitting balance-Leahy Scale: Good     Standing balance support: Bilateral upper extremity supported Standing balance-Leahy Scale: Poor Standing balance comment: reliance on B UE support on RW, able to demo hopping forward/back at bedside                            ADL either performed or assessed with clinical judgement   ADL Overall ADL's : Needs assistance/impaired                     Lower Body Dressing: Set up;Sitting/lateral leans Lower Body Dressing Details (indicate  cue type and reason): Setup to don/doff tennis shoe, cues and education provided with OT assist with post op shoe    Toilet Transfer Details (indicate cue type and reason): Pt declined BSC transfer today, requested placement of bed pan                 Vision       Perception     Praxis      Cognition Arousal/Alertness:  Awake/alert Behavior During Therapy: [facility] for tasks assessed/performed;Anxious Overall Cognitive Status: No family/caregiver present to determine baseline cognitive functioning                                 General Comments: pt is anxious, more comfortable with one step commands, appreciates information regarding the purpose of each activity/steps        Exercises     Shoulder Instructions       General Comments Assessed orthostatic BPs at: 135/77 (rest), 113/60 (sitting EOB), 84/55 (standing for 1 min with RW)    Pertinent Vitals/ Pain       Pain Assessment: No/denies pain  Home Living                                          Prior Functioning/Environment              Frequency  Min 2X/week        Progress Toward Goals  OT Goals(current goals can now be found in the care plan section)  Progress towards OT goals: Progressing toward goals  Acute Rehab OT Goals Patient Stated Goal: to get home OT Goal Formulation: With patient Time For Goal Achievement: 03/01/20 Potential to Achieve Goals: Good ADL Goals Pt Will Perform Grooming: with min guard assist;standing Pt Will Perform Upper Body Bathing: with modified independence;sitting Pt Will Perform Lower Body Bathing: with min assist;sit to/from stand Pt Will Transfer to Toilet: with min assist;stand pivot transfer Pt Will Perform Toileting - Clothing Manipulation and hygiene: with min assist;sit to/from stand  Plan Discharge plan remains appropriate    Co-evaluation          OT goals addressed during session: ADL's and self-care;Other (comment)(ADL transfers )      AM-PAC OT "6 Clicks" Daily Activity     Outcome Measure   Help from another person eating meals?: None Help from another person taking care of personal grooming?: A Little Help from another person toileting, which includes using toliet, bedpan, or urinal?: A Lot Help from another person bathing (including  washing, rinsing, drying)?: A Lot Help from another person to put on and taking off regular upper body clothing?: A Little Help from another person to put on and taking off regular lower body clothing?: A Lot 6 Click Score: 16    End of Session Equipment Utilized During Treatment: Gait belt;Rolling walker  OT Visit Diagnosis: Unsteadiness on feet (R26.81);Other abnormalities of gait and mobility (R26.89);History of falling (Z91.81);Muscle weakness (generalized) (M62.81);Other symptoms and signs involving cognitive function   Activity Tolerance Patient tolerated treatment well   Patient Left in bed;with call bell/phone within reach;with bed alarm set   Nurse Communication Mobility status;Other (comment)(anxiety, BPs, and pt requesting trauma counselor)        Time: 0350-0938 OT Time Calculation (min): 49 min  Charges: OT General Charges $OT Visit:  1 Visit OT Treatments $Self Care/Home Management : 8-22 mins $Therapeutic Activity: 23-37 mins  Lorre Munroe, OTR/L   Lorre Munroe 02/15/2020, 10:26 AM

## 2020-02-15 NOTE — Progress Notes (Signed)
Nutrition Follow-up  DOCUMENTATION CODES:   Not applicable  INTERVENTION:   Glucerna Shake po TID, each supplement provides 220 kcal and 10 grams of protein  30 ml Prostat BID, each supplement provides 100 kcals and 15 grams protein.   Continue MVI with Minerals  NUTRITION DIAGNOSIS:   Increased nutrient needs related to wound healing as evidenced by estimated needs.  Being addressed via supplements, MVI  GOAL:   Patient will meet greater than or equal to 90% of their needs  Meeting with interventions   MONITOR:   PO intake, Supplement acceptance, Weight trends, Labs, I & O's, Skin  REASON FOR ASSESSMENT:   Consult Assessment of nutrition requirement/status  ASSESSMENT:   Patient with PMH significant for delusional disorder, essential HTN, and uncontrolled DM. Presents this admission with sepsis likely 2/2 to bilateral foot ulcers.    3/06 Transmetatarsal amputation of right foot with wound vac placement, I&D of left great toe  Pt awaits SNF placement.  Meal Completion: 100% Per MD pt with soft BP, 1 L fluid bolus given.   Labs: sodium 134 (L) CBGs [phone] Meds: ss novolog, novolog 70/30 BID, MVI with Minerals, B-12   Diet Order:   Diet Order            Diet Carb Modified Fluid consistency: Thin; Room service appropriate? Yes  Diet effective now              EDUCATION NEEDS:   Not appropriate for education at this time  Skin:  Skin Assessment: Skin Integrity Issues: Skin Integrity Issues:: Other (Comment), Wound VAC Wound Vac: transmetatarsal amputation on right foot on 3/06 Diabetic Ulcer: bilateral feet Other: I&D of L. great toe on 3/06  Last BM:  3/17 type 4, medium  Height:   Ht Readings from Last 1 Encounters:  02/01/20 5\' 8"  (1.727 m)    Weight:   Wt Readings from Last 1 Encounters:  02/11/20 89.8 kg    Ideal Body Weight:  70 kg  BMI:  Body mass index is 30.1 kg/m.  Estimated Nutritional Needs:   Kcal:   2100-2300 kcal  Protein:  105-120 grams  Fluid:  >/= 2.1 L/day  02/13/20., RD, LDN, CNSC See AMiON for contact information

## 2020-02-16 LAB — CBC WITH DIFFERENTIAL/PLATELET
Abs Immature Granulocytes: 0.03 10*3/uL (ref 0.00–0.07)
Basophils Absolute: 0.1 10*3/uL (ref 0.0–0.1)
Basophils Relative: 1 %
Eosinophils Absolute: 0.1 10*3/uL (ref 0.0–0.5)
Eosinophils Relative: 1 %
HCT: 28.7 % — ABNORMAL LOW (ref 39.0–52.0)
Hemoglobin: 8.7 g/dL — ABNORMAL LOW (ref 13.0–17.0)
Immature Granulocytes: 0 %
Lymphocytes Relative: 27 %
Lymphs Abs: 2.2 10*3/uL (ref 0.7–4.0)
MCH: 22.2 pg — ABNORMAL LOW (ref 26.0–34.0)
MCHC: 30.3 g/dL (ref 30.0–36.0)
MCV: 73.2 fL — ABNORMAL LOW (ref 80.0–100.0)
Monocytes Absolute: 0.7 10*3/uL (ref 0.1–1.0)
Monocytes Relative: 9 %
Neutro Abs: 5 10*3/uL (ref 1.7–7.7)
Neutrophils Relative %: 62 %
Platelets: 387 10*3/uL (ref 150–400)
RBC: 3.92 MIL/uL — ABNORMAL LOW (ref 4.22–5.81)
RDW: 15.7 % — ABNORMAL HIGH (ref 11.5–15.5)
WBC: 8.2 10*3/uL (ref 4.0–10.5)
nRBC: 0 % (ref 0.0–0.2)

## 2020-02-16 LAB — GLUCOSE, CAPILLARY
Glucose-Capillary: 204 mg/dL — ABNORMAL HIGH (ref 70–99)
Glucose-Capillary: 136 mg/dL — ABNORMAL HIGH (ref 70–99)
Glucose-Capillary: 143 mg/dL — ABNORMAL HIGH (ref 70–99)
Glucose-Capillary: 136 mg/dL — ABNORMAL HIGH (ref 70–99)

## 2020-02-16 NOTE — Progress Notes (Signed)
PROGRESS NOTE    Chris Bowman  ZOX:[id number] DOB: [birth year] DOA: 02/01/2020 PCP: Patient, No Pcp Per   Brief Narrative: 59 year old male with UNknown medical history, who has not seen a doctor since thousand 18 presented to the ER on 3/4 with weakness and falls x2 weeks and associated fever dyspnea cough, chronic buttock pain since the fall along with limitation of range of motion, right arm pain.  Apparently diagnosed with diabetes in thousand and but did not think he needed medicine for it, reported history of taking too many baby aspirin and having stomach pain" I have a little incision is scar from that" in the ED afebrile Covid negative WBC 20.4K glucose 300 A1c 11 sodium 120,patient was admitted under hospitalist.Work-up showed MSSA UTI, bacteremia bilateral foot osteomyelitis with associated sepsis and concern for cervical spine epidural abscess/phlegmon C3-C5 no cord compression, no evidence of discitis or osteomyelitis.Patient was seen by neurosurgery, infectious disease.Patient underwent TEE-negative for endocarditis, MRI spine discitis/osteo-/small phlegmon, is a status post I&D of the left toe, 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  Patient is on Ancef as per ID and will need 6 weeks of IV antibiotic, repeat blood culture on 3/6 and PICC line 3/10.On 3/8 urine retention needing in and out cath and subsequently needing Foley catheter. 3/11 temp spike 101 and 3/10 100.5. ID reconsulted and Blood cx/MRI c-t spine ordered no extension of infection in thoracic area advised to continue antibiotic. 3/17, 3/18-episodes of orthostatic hypotension needing IV boluses.  Subjective: Seen and examined this morning.  Sitting in the bedside chair no more dizziness no more hypotension. No more neck pain but at times feels like it may come back. No fever spikes since 3/13. T-max 99. WBC count stable at 8.2K. Reports he is more active and able to get up to the  bedside walker and ambulate. Foley catheter out 3/18 and has been voiding well.  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 S/p TEE negative for endocarditis, normal EF with tiny PFO with right to left shunt, status post I&D of ulcer on the left great toe, s/p TMA ray amputation of right foot with wound VAC placement 3/6-wound VAC out 3/11 and healing well on daily dry dressing. Neurosurgery recommends 6 weeks to 3 months and repeat MRI C-spine with and without contrast in 6 weeks or sooner if progressive myelopathy develops or if develops worse back pain will need MRI lumbar spine with and without contrast.repeat blood culture and ID eval 3/12-negative.  Repeat CAT MRI with and without contrast no extension into thoracic area, some expected progression this early in cervical area and no changes otherwise in treatment except IV antibiotics.I had discussed with neurosurgery PA Costella on-call 3/14 and notifed Dr. Marcello Moores from neurosurgery too. Continue Ancef plan for 6 weeks and reassess- so, he will need to follow-up with infectious disease.  Episode of hypotension: ?  Etiology likely from poor intake and orthostatic this morning.  Status post boluses IV fluid 3/17, 3/18.  Continue gentle normal saline.  Pressure is stable.  Monitor orthostatics intermittently  Multiple bowel movements, stopped laxatives, addedas needed Imodium. Improved  Scalene Myositis: Prevertebral edema with right-sided paravertebral fluid collections in the mid to lower cervical spine concerning for infection and abscesses with suspected myositis involving the right-sided scalene musculature noted on MRI. Case discussed with ENT Dr. Wilburn Cornelia who recommend antibiotic treatment,no surgical intervention  needed  Urine retention continue supportive care.Placed on indwelling Foley- placed 3/8 when he had 700 ml urine on bladder  scan. Foley catheter discontinued 3/18-on voiding trial. This is 2nd removal.  AKI secondary to ATN in the setting of sepsis.No obstruction in the ultrasound.  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  Monitor renal function closely. Creat at stable at 1.2 monitor. Recent Labs  Lab 02/10/20 0404 02/13/20 0335 02/15/20 0325  BUN 32* 31* 31*  CREATININE 1.21 1.27* 1.20    Type 2 diabetes mellitus with hyperglycemia, without long-term current use of insulin: hba1c uncontrolled at 11.3.  Started on Levemir and changed to insulin 70/30,Sugar fairly controlled. Appreciate diabetic coordinator follow-up. monitor and adjust insulin.  pt/sister has been educated on insulin use.Marland Kitchen   Recent Labs  Lab 02/15/20 0729 02/15/20 1137 02/15/20 1650 02/15/20 2116 02/16/20 0638  GLUCAP 191* 143* 205* 196* 204*   Ulcer of left foot due to type 2 diabetes mellitus cont wound care  Hyponatremia: Likely from hypovolemia. Sodium improved,  Generalized weakness/frequent fall : Continue PT OT and will need a skilled nursing facility placement.  SW working on it. Discussed with Education officer, museum.  Seen by neurology sent neuropathy,  B1 B6 level, which are WNL, MMA slightly up at 403,.  Transaminitis with ALP elevation. AST/ALT also elevated mildly in 50s now, improving.Hepatitis panel negative.  Likely from sepsis.  Ultrasound shows cholelithiasis but no obstruction. Monitor.  Microcytic anemia B12 normal folate normal TSH normal iron panel, with elevated ferritin 543, iron low at 12, TIBC low at 174.  Delusional disorder/impaired memory MRI brain showed mild cerebral atrophy and chronic small vessel ischemia.Currently patient is alert awake oriented, follows commands.  Has some tangential thought process.  HIV screen negative. he reports he is feeling much more clear and able to think better.  He directs towards his sister for any kind of decision.  Nutrition: Nutrition Problem: Increased nutrient needs Etiology:  wound healing Signs/Symptoms: estimated needs Interventions: Glucerna shake, MVI Body mass index is 25.84 kg/m.   DVT prophylaxis:SCD Code Status:full Family Communication: plan of care discussed with patient at bedside.I had updated sister over the phone and in person.She is working on placement, working with Development worker, community. Disposition Plan: Patient is from:Home Anticipated Disposition: to skilled nursing facility Barriers to discharge or conditions that needs to be met prior to discharge: Patient admitted with MSSA sepsis foot osteomyelitis, cervical osteo/discitis, remains hospitalized for IV antibiotics.  Having intermittent fever completed MRI C and T-spine obtained and no extension of epidural abscess. Plan for SNF , for IV antibiotics x6 weeks or longer. Awaiting medicaid application-sister working with a Development worker, community, awaiting placement.  Overall difficult disposition.  Consultants:ID, neurology, cardiology for TEE, neurosurgery, orthopedics  Procedures:  MRI C and T-spine with and without contrast 3/14 1. Progressive enhancing epidural collection in the cervical spine centered at C4-5. Abnormal disc signal, endplate signal, and enhancement suggest disc osteomyelitis at this level. 2. Abnormal epidural fluid enhancement extending superiorly to C2-3 and inferiorly to C5. 3. Additional dural enhancement extends superiorly to the C1-2 level. 4. Subtle signal changes may represent early discitis at C6-7. 5. Cervical paraspinal disease on the right with enhancement of paraspinous soft tissues including paraspinous musculature consistent with diffuse infection. 6. Abnormal signal and enhancement within the right cervical foramina at C3-4, C4-5, and C5-6.    Microbiology: Urine culture MSSA, blood culture MSSA, repeat BLOOD CX 3/6 - so far   Medications: Scheduled Meds: . Chlorhexidine Gluconate Cloth  6 each  Topical Daily  . feeding supplement (GLUCERNA SHAKE)   237 mL Oral TID BM  . feeding supplement (PRO-STAT SUGAR FREE 64)  30 mL Oral BID  . insulin aspart  0-15 Units Subcutaneous TID WC  . insulin aspart  0-5 Units Subcutaneous QHS  . insulin aspart protamine- aspart  8 Units Subcutaneous BID WC  . insulin starter kit- pen needles  1 kit Other Once  . multivitamin with minerals  1 tablet Oral Daily  . tamsulosin  0.4 mg Oral Daily  . vitamin B-12  1,000 mcg Oral Daily   Continuous Infusions: . sodium chloride 50 mL/hr at 02/15/20 2307  .  ceFAZolin (ANCEF) IV 2 g (02/16/20 0659)  . lactated ringers Stopped (02/03/20 1146)  . methocarbamol (ROBAXIN) IV      Antimicrobials: Anti-infectives (From admission, onward)   Start     Dose/Rate Route Frequency Ordered Stop   02/03/20 1130  ceFAZolin (ANCEF) IVPB 1 g/50 mL premix  Status:  Discontinued     1 g 100 mL/hr over 30 Minutes Intravenous Every 6 hours 02/03/20 1124 02/03/20 1139   02/02/20 1100  vancomycin (VANCOREADY) IVPB 750 mg/150 mL  Status:  Discontinued     750 mg 150 mL/hr over 60 Minutes Intravenous Every 24 hours 02/01/20 1218 02/02/20 0221   02/02/20 0600  ceFAZolin (ANCEF) IVPB 2g/100 mL premix     2 g 200 mL/hr over 30 Minutes Intravenous Every 8 hours 02/02/20 0221     02/01/20 2230  ceFEPIme (MAXIPIME) 2 g in sodium chloride 0.9 % 100 mL IVPB  Status:  Discontinued     2 g 200 mL/hr over 30 Minutes Intravenous Every 12 hours 02/01/20 1218 02/02/20 0221   02/01/20 1200  metroNIDAZOLE (FLAGYL) IVPB 500 mg  Status:  Discontinued     500 mg 100 mL/hr over 60 Minutes Intravenous Every 8 hours 02/01/20 1149 02/02/20 0221   02/01/20 1015  ceFEPIme (MAXIPIME) 2 g in sodium chloride 0.9 % 100 mL IVPB     2 g 200 mL/hr over 30 Minutes Intravenous  Once 02/01/20 1008 02/01/20 1056   02/01/20 1015  vancomycin (VANCOREADY) IVPB 1750 mg/350 mL     1,750 mg 175 mL/hr over 120 Minutes Intravenous  Once 02/01/20 1008 02/01/20 1336     Objective: Vitals: Today's Vitals    02/15/20 0837 02/15/20 0900 02/15/20 2113 02/16/20 0434  BP:  132/85 129/80   Pulse:  88 90   Resp:  16 16   Temp:  98.6 F (37 C) 99 F (37.2 C)   TempSrc:  Oral Oral   SpO2:  98% 96%   Weight:   77.1 kg   Height:      PainSc: 0-No pain   Asleep    Intake/Output Summary (Last 24 hours) at 02/16/2020 0820 Last data filed at 02/16/2020 0500 Gross per 24 hour  Intake 2483.36 ml  Output 1075 ml  Net 1408.36 ml   Filed Weights   02/10/20 2038 02/11/20 2127 02/15/20 2113  Weight: 89.8 kg 89.8 kg 77.1 kg   Weight change:    Intake/Output from previous day: 03/18 0701 - 03/19 0700 In: 2483.4 [P.O.:700; I.V.:1083.4; IV Piggyback:700] Out: 1075 [Urine:1075] Intake/Output this shift: No intake/output data recorded.  Examination:  General exam: AAO X3, NAD, on room air. HEENT:Oral mucosa moist, Ear/Nose WNL grossly, dentition normal. Respiratory system: B/L clear not in acute distress, no use of accessory muscle. Cardiovascular system: S1 & S2 +, No JVD. Gastrointestinal system: Abdomen soft,  NT,ND, BS+. Nervous System:Alert, awake, nonfocal on exam intact sensation.  Moving all his extremities well.  No neck or back tenderness. Extremities: No edema, dressing intact on the right foot,  Skin: No rashes,no icterus. MSK: Normal muscle bulk,tone, power. Data Reviewed:I have personally reviewed following labs and imaging studies. CBC: Recent Labs  Lab 02/12/20 0300 02/13/20 0335 02/14/20 0201 02/15/20 0325 02/16/20 0402  WBC 10.8* 10.1 8.9 8.2 8.2  NEUTROABS 6.9 6.3 5.8 4.6 5.0  HGB 9.1* 9.0* 9.5* 8.8* 8.7*  HCT 29.7* 29.2* 31.0* 28.3* 28.7*  MCV 73.0* 71.9* 72.8* 73.3* 73.2*  PLT 457* 442* 454* 410* 852   Basic Metabolic Panel: Recent Labs  Lab 02/10/20 0404 02/13/20 0335 02/15/20 0325  NA 131* 132* 134*  K 4.2 4.5 4.3  CL 97* 98 103  CO2 24 24 22   GLUCOSE 191* 215* 195*  BUN 32* 31* 31*  CREATININE 1.21 1.27* 1.20  CALCIUM 8.5* 8.5* 8.5*   GFR: Estimated  Creatinine Clearance: 65.7 mL/min (by C-G formula based on SCr of 1.2 mg/dL). Liver Function Tests: Recent Labs  Lab 02/10/20 0404 02/13/20 0335 02/15/20 0325  AST 56* 31 23  ALT 50* 24 17  ALKPHOS 208* 160* 154*  BILITOT 0.3 0.3 0.2*  PROT 6.5 6.8 6.3*  ALBUMIN 1.8* 1.8* 1.9*   No results for input(s): LIPASE, AMYLASE in the last 168 hours. No results for input(s): AMMONIA in the last 168 hours. Coagulation Profile: No results for input(s): INR, PROTIME in the last 168 hours. Cardiac Enzymes: No results for input(s): CKTOTAL, CKMB, CKMBINDEX, TROPONINI in the last 168 hours. BNP (last 3 results) No results for input(s): PROBNP in the last 8760 hours. HbA1C: No results for input(s): HGBA1C in the last 72 hours. CBG: Recent Labs  Lab 02/15/20 0729 02/15/20 1137 02/15/20 1650 02/15/20 2116 02/16/20 0638  GLUCAP 191* 143* 205* 196* 204*   Lipid Profile: No results for input(s): CHOL, HDL, LDLCALC, TRIG, CHOLHDL, LDLDIRECT in the last 72 hours. Thyroid Function Tests: No results for input(s): TSH, T4TOTAL, FREET4, T3FREE, THYROIDAB in the last 72 hours. Anemia Panel: No results for input(s): VITAMINB12, FOLATE, FERRITIN, TIBC, IRON, RETICCTPCT in the last 72 hours. Sepsis Labs: No results for input(s): PROCALCITON, LATICACIDVEN in the last 168 hours.  Recent Results (from the past 240 hour(s))  Culture, blood (routine x 2)     Status: None   Collection Time: 02/09/20  1:38 PM   Specimen: BLOOD  Result Value Ref Range Status   Specimen Description BLOOD LEFT ANTECUBITAL  Final   Special Requests AEROBIC BOTTLE ONLY Blood Culture adequate volume  Final   Culture   Final    NO GROWTH 5 DAYS Performed at [facility], [address] [place], [place] [address]    Report Status 02/14/2020 FINAL  Final  Culture, blood (routine x 2)     Status: None   Collection Time: 02/09/20  1:39 PM   Specimen: BLOOD LEFT HAND  Result Value Ref Range Status   Specimen  Description BLOOD LEFT HAND  Final   Special Requests   Final    BOTTLES DRAWN AEROBIC AND ANAEROBIC Blood Culture adequate volume   Culture   Final    NO GROWTH 5 DAYS Performed at [facility], [place] [address], [place], [place] [address]    Report Status 02/14/2020 FINAL  Final      Radiology Studies: No results found.  LOS: 15 days  Time spent: More than 50% of  that time was spent in counseling and/or coordination of care.  Antonieta Pert, MD [facility]  02/16/2020, 8:20 AM

## 2020-02-16 NOTE — Progress Notes (Signed)
Chaplain engaged in initial visit with Chris Bowman and his sister.  Chaplain provided education around Scientist, water quality.  Chaplain worked to find witnesses, but none were available at this time. Chris Bowman knows that chaplain is working to find notary and witnesses.   Chaplain will pass along Advanced Directive completion to unit chaplain.

## 2020-02-16 NOTE — Plan of Care (Signed)
  Problem: Activity: Goal: Risk for activity intolerance will decrease Outcome: Progressing   

## 2020-02-17 DIAGNOSIS — R509 Fever, unspecified: Secondary | ICD-10-CM

## 2020-02-17 LAB — CBC WITH DIFFERENTIAL/PLATELET
Abs Immature Granulocytes: 0.03 10*3/uL (ref 0.00–0.07)
Basophils Absolute: 0.1 10*3/uL (ref 0.0–0.1)
Basophils Relative: 1 %
Eosinophils Absolute: 0.2 10*3/uL (ref 0.0–0.5)
Eosinophils Relative: 3 %
HCT: 28.7 % — ABNORMAL LOW (ref 39.0–52.0)
Hemoglobin: 8.6 g/dL — ABNORMAL LOW (ref 13.0–17.0)
Immature Granulocytes: 0 %
Lymphocytes Relative: 27 %
Lymphs Abs: 2 10*3/uL (ref 0.7–4.0)
MCH: 22 pg — ABNORMAL LOW (ref 26.0–34.0)
MCHC: 30 g/dL (ref 30.0–36.0)
MCV: 73.4 fL — ABNORMAL LOW (ref 80.0–100.0)
Monocytes Absolute: 0.6 10*3/uL (ref 0.1–1.0)
Monocytes Relative: 8 %
Neutro Abs: 4.4 10*3/uL (ref 1.7–7.7)
Neutrophils Relative %: 61 %
Platelets: 366 10*3/uL (ref 150–400)
RBC: 3.91 MIL/uL — ABNORMAL LOW (ref 4.22–5.81)
RDW: 15.9 % — ABNORMAL HIGH (ref 11.5–15.5)
WBC: 7.3 10*3/uL (ref 4.0–10.5)
nRBC: 0 % (ref 0.0–0.2)

## 2020-02-17 LAB — GLUCOSE, CAPILLARY
Glucose-Capillary: 185 mg/dL — ABNORMAL HIGH (ref 70–99)
Glucose-Capillary: 151 mg/dL — ABNORMAL HIGH (ref 70–99)
Glucose-Capillary: 228 mg/dL — ABNORMAL HIGH (ref 70–99)
Glucose-Capillary: 101 mg/dL — ABNORMAL HIGH (ref 70–99)

## 2020-02-17 NOTE — Progress Notes (Addendum)
PROGRESS NOTE    Chris Bowman  IWL:[id number] DOB: 02/07/1961 DOA: 02/01/2020 PCP: Patient, No Pcp Per   Brief Narrative:  59 year old male with UNknown medical history, who has not seen a doctor since 2018 presented to the ER on 3/4 with weakness and falls x2 weeks and associated fever dyspnea cough, chronic buttock pain since the fall along with limitation of range of motion, right arm pain.  Apparently diagnosed with diabetes in thousand and but did not think he needed medicine for it, reported history of taking too many baby aspirin and having stomach pain" I have a little incision is scar from that" in the ED afebrile Covid negative WBC 20.4K glucose 300 A1c 11 sodium 120,patient was admitted under hospitalist.Work-up showed MSSA UTI, bacteremia bilateral foot osteomyelitis with associated sepsis and concern for cervical spine epidural abscess/phlegmon C3-C5 no cord compression, no evidence of discitis or osteomyelitis.Patient was seen by neurosurgery, infectious disease.Patient underwent TEE-negative for endocarditis, MRI spine discitis/osteo-/small phlegmon, is a status post I&D of the left toe, 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  Patient is on Ancef as per ID and will need 6 weeks of IV antibiotic, repeat blood culture on 3/6 and PICC line 3/10.On 3/8 urine retention needing in and out cath and subsequently needing Foley catheter. 3/11 temp spike 101 and 3/10 100.5. ID reconsulted and Blood cx/MRI c-t spine ordered no extension of infection in thoracic area advised to continue antibiotic. 3/17, 3/18-episodes of orthostatic hypotension needing IV boluses.   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 S/p TEE negative for endocarditis, normal EF with tiny PFO with right to left shunt, status post I&D of ulcer on the left great toe, s/p TMA ray amputation of right foot with wound VAC placement 3/6-wound VAC out 3/11 and healing well on daily dry dressing. Neurosurgery recommends 6 weeks to 3 months and repeat MRI C-spine with and without contrast in 6 weeks or sooner if progressive myelopathy develops or if develops worse back pain will need MRI lumbar spine with and without contrast.repeat blood culture and ID eval 3/12-negative.  Repeat CAT MRI with and without contrast no extension into thoracic area, some expected progression this early in cervical area and no changes otherwise in treatment except IV antibiotics.I had discussed with neurosurgery PA Costella on-call 3/14 and notifed Dr. Marcello Moores from neurosurgery too. NO CHANGES-Continue Ancef plan for 6 weeks and reassess- so, he will need to follow-up with infectious disease.  Episode of hypotension: ?  Etiology likely from poor intake.  Status post boluses IV fluid 3/17, 3/18.  Continue gentle normal saline an additional 24 hrs.  Pressure is stable.    Multiple bowel movements, stopped laxatives, c/w as needed Imodium. Improved  Scalene Myositis: Prevertebral edema with right-sided paravertebral fluid collections in the mid to lower cervical spine concerning for infection and abscesses with suspected myositis involving the  right-sided scalene musculature noted on MRI. Case discussed with ENT Dr. Wilburn Cornelia who recommend antibiotic treatment,no surgical intervention needed  Urine retention continue supportive  care.Placed on indwelling Foley- placed 3/8 when he had 700 ml urine on bladder scan. Foley catheter discontinued 3/18-on voiding trial. This is 2nd removal.  AKI secondary to ATN in the setting of sepsis.No obstruction in the ultrasound.  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  Monitor renal function closely. Creat at stable at 1.2 monitor.   Type 2 diabetes mellitus with hyperglycemia, without long-term current use of insulin: hba1c uncontrolled at 11.3.  Started on Levemir and changed to insulin 70/30,Sugar fairly controlled. Appreciate diabetic coordinator follow-up. monitor and adjust insulin.  pt/sister has been educated on insulin use.Marland Kitchen    Ulcer of left foot due to type 2 diabetes mellitus cont wound care  Hyponatremia: Likely from hypovolemia. Sodium improved,  Generalized weakness/frequent fall : Continue PT OT and will need a skilled nursing facility placement.  SW working on it.  Seen by neurology sent  B1 B6 level, which are WNL, MMA slightly up at 403,.  Transaminitis with ALP elevation. AST/ALT also elevated mildly in 50s now, improving.Hepatitis panel negative.  Likely from sepsis.  Ultrasound shows cholelithiasis but no obstruction. Monitor.  Microcytic anemia B12 normal folate normal TSH normal iron panel, with elevated ferritin 543, iron low at 12, TIBC low at 174.  Delusional disorder/impaired memory MRI brain showed mild cerebral atrophy and chronic small vessel ischemia.Currently patient is alert awake oriented, follows commands.  Has some tangential thought process.  HIV screen negative. he reports he is feeling much more clear and able to think better.  .  Nutrition: Nutrition Problem: Increased nutrient needs Etiology: wound healing Signs/Symptoms: estimated needs Interventions: Glucerna shake, MVI Body mass index is 25.84 kg/m.    DVT prophylaxis: SCD/Compression stockings  Code Status: full    Code Status Orders  (From admission, onward)          Start     Ordered   02/01/20 1146  Full code  Continuous     02/01/20 1149        Code Status History    This patient has a current code status but no historical code status.   Advance Care Planning Activity     Family Communication: Plan of care discussed with patient at bedside Disposition Plan:   Patient with sepsis and foot osteomyelitis no safe discharge plan get available, family working on [employer] application with Development worker, community. Consults called: None Admission status: Inpatient   Consultants:   Infectious disease, neurology, cardiology for TEE, neurosurgery, orthopedics  Procedures:  DG Shoulder Right  Result Date: 02/01/2020 CLINICAL DATA:  59 year old male with fall and right shoulder pain. EXAM: RIGHT SHOULDER - 2+ VIEW COMPARISON:  Chest radiograph dated 02/01/2020. FINDINGS: There is no acute fracture or dislocation. No significant arthritic changes. Mild elevation of the right humeral head may represent chronic rotator cuff injury. There is degenerative changes of the right AC joint. The soft tissues are unremarkable. IMPRESSION: No acute fracture or dislocation. Electronically Signed   By: Anner Crete M.D.   On: 02/01/2020 19:17   MR BRAIN WO CONTRAST  Result Date: 02/01/2020 CLINICAL DATA:  Generalized weakness, gait instability, and multiple falls. EXAM: MRI HEAD WITHOUT CONTRAST MRI CERVICAL SPINE WITHOUT CONTRAST TECHNIQUE: Multiplanar, multiecho pulse sequences of the brain and surrounding structures, and cervical spine, to include the craniocervical junction and cervicothoracic junction, were obtained without intravenous contrast. COMPARISON:  None. FINDINGS: MRI HEAD  FINDINGS The study is mildly motion degraded. Brain: There is no evidence of acute infarct, intracranial hemorrhage, mass, midline shift, or extra-axial fluid collection. There is mild cerebral atrophy. Periventricular white matter T2 hyperintensities are nonspecific but compatible with minimal  chronic small vessel ischemic disease. Vascular: Major intracranial vascular flow voids are preserved. Skull and upper cervical spine: No suspicious marrow lesion. Sinuses/Orbits: Unremarkable orbits. Minimal left ethmoid air cell mucosal thickening. At most trace right mastoid fluid. Other: None. MRI CERVICAL SPINE FINDINGS The study is motion degraded throughout including severe motion on the axial sequences. Alignment: No significant listhesis. Vertebrae: No fracture or suspicious marrow lesion. Chronic degenerative endplate changes at G2-6 associated with severe disc space narrowing. Moderate disc space narrowing at C4-5. No discal fluid signal or frank vertebral marrow edema to indicate discitis or osteomyelitis. Small right facet joint effusion at C5-6 without substantial facet marrow edema. Cord: No cord signal abnormality identified within limitations of motion artifact. Posterior Fossa, vertebral arteries, paraspinal tissues: There is prevertebral edema, and there are apparent fluid collections along the right anterolateral aspects of the C3-C5 vertebral bodies measuring up to approximately 4 cm in craniocaudal length and with a maximal transverse diameter of approximately 1.3 cm. There is a diffusely edematous appearance of the right scalene musculature more inferiorly in the neck. The vertebral artery flow voids are grossly preserved bilaterally. Disc levels: A small ventral epidural fluid collection is suspected asymmetrically to the right of midline extending from the C3 inferior to C5 superior endplate levels (for example series 5, image 7 and series 10, image 22). This measures 2-2.5 cm in craniocaudal length and less than 1 cm in transverse dimension and contributes to mild spinal stenosis at these levels without frank cord compression. Detailed assessment of degenerative changes is limited by motion, however there is no significant spinal stenosis elsewhere. Disc bulging and uncovertebral spurring  result in neural foraminal stenosis which is likely moderate bilaterally at C3-4, severe on the right and moderate to severe on the left at C4-5, mild on the right at C5-6, and moderate on the right at C6-7. IMPRESSION: MRI HEAD: 1. No acute intracranial abnormality. 2. Mild cerebral atrophy and chronic small vessel ischemia. MRI CERVICAL SPINE: 1. Severely motion degraded examination. 2. Prevertebral edema with right-sided paravertebral fluid collections in the mid to lower cervical spine concerning for infection and abscesses with suspected myositis involving the right-sided scalene musculature. 3. Suspected small ventral epidural abscess or phlegmon from C3-C5 with mild spinal stenosis. No cord compression. 4. No definite evidence of discitis or osteomyelitis. 5. Small volume facet joint fluid on the right at C5-6 is favored to be degenerative although septic arthritis is not excluded. These results will be called to the ordering clinician or representative by the Radiologist Assistant, and communication documented in the PACS or zVision Dashboard. Electronically Signed   By: Logan Bores M.D.   On: 02/01/2020 19:38   MR CERVICAL SPINE WO CONTRAST  Result Date: 02/01/2020 CLINICAL DATA:  Generalized weakness, gait instability, and multiple falls. EXAM: MRI HEAD WITHOUT CONTRAST MRI CERVICAL SPINE WITHOUT CONTRAST TECHNIQUE: Multiplanar, multiecho pulse sequences of the brain and surrounding structures, and cervical spine, to include the craniocervical junction and cervicothoracic junction, were obtained without intravenous contrast. COMPARISON:  None. FINDINGS: MRI HEAD FINDINGS The study is mildly motion degraded. Brain: There is no evidence of acute infarct, intracranial hemorrhage, mass, midline shift, or extra-axial fluid collection. There is mild cerebral atrophy. Periventricular white matter T2 hyperintensities are nonspecific but compatible  with minimal chronic small vessel ischemic disease. Vascular:  Major intracranial vascular flow voids are preserved. Skull and upper cervical spine: No suspicious marrow lesion. Sinuses/Orbits: Unremarkable orbits. Minimal left ethmoid air cell mucosal thickening. At most trace right mastoid fluid. Other: None. MRI CERVICAL SPINE FINDINGS The study is motion degraded throughout including severe motion on the axial sequences. Alignment: No significant listhesis. Vertebrae: No fracture or suspicious marrow lesion. Chronic degenerative endplate changes at Z6-1 associated with severe disc space narrowing. Moderate disc space narrowing at C4-5. No discal fluid signal or frank vertebral marrow edema to indicate discitis or osteomyelitis. Small right facet joint effusion at C5-6 without substantial facet marrow edema. Cord: No cord signal abnormality identified within limitations of motion artifact. Posterior Fossa, vertebral arteries, paraspinal tissues: There is prevertebral edema, and there are apparent fluid collections along the right anterolateral aspects of the C3-C5 vertebral bodies measuring up to approximately 4 cm in craniocaudal length and with a maximal transverse diameter of approximately 1.3 cm. There is a diffusely edematous appearance of the right scalene musculature more inferiorly in the neck. The vertebral artery flow voids are grossly preserved bilaterally. Disc levels: A small ventral epidural fluid collection is suspected asymmetrically to the right of midline extending from the C3 inferior to C5 superior endplate levels (for example series 5, image 7 and series 10, image 22). This measures 2-2.5 cm in craniocaudal length and less than 1 cm in transverse dimension and contributes to mild spinal stenosis at these levels without frank cord compression. Detailed assessment of degenerative changes is limited by motion, however there is no significant spinal stenosis elsewhere. Disc bulging and uncovertebral spurring result in neural foraminal stenosis which is  likely moderate bilaterally at C3-4, severe on the right and moderate to severe on the left at C4-5, mild on the right at C5-6, and moderate on the right at C6-7. IMPRESSION: MRI HEAD: 1. No acute intracranial abnormality. 2. Mild cerebral atrophy and chronic small vessel ischemia. MRI CERVICAL SPINE: 1. Severely motion degraded examination. 2. Prevertebral edema with right-sided paravertebral fluid collections in the mid to lower cervical spine concerning for infection and abscesses with suspected myositis involving the right-sided scalene musculature. 3. Suspected small ventral epidural abscess or phlegmon from C3-C5 with mild spinal stenosis. No cord compression. 4. No definite evidence of discitis or osteomyelitis. 5. Small volume facet joint fluid on the right at C5-6 is favored to be degenerative although septic arthritis is not excluded. These results will be called to the ordering clinician or representative by the Radiologist Assistant, and communication documented in the PACS or zVision Dashboard. Electronically Signed   By: Logan Bores M.D.   On: 02/01/2020 19:38   MR CERVICAL SPINE W WO CONTRAST  Result Date: 02/11/2020 CLINICAL DATA:  Question epidural abscess. Recent falls and weakness. Hyperglycemia. MSSA bacteremia. Osteomyelitis of the foot. Previous abnormal MRI of the cervical spine with possible epidural abscess. EXAM: MRI CERVICAL AND LUMBAR SPINE WITHOUT AND WITH CONTRAST TECHNIQUE: Multiplanar and multiecho pulse sequences of the cervical spine, to include the craniocervical junction and cervicothoracic junction, and lumbar spine, were obtained without and with intravenous contrast. CONTRAST:  58m GADAVIST GADOBUTROL 1 MMOL/ML IV SOLN COMPARISON:  MRI of the cervical spine without contrast 02/01/2020. FINDINGS: MRI CERVICAL SPINE FINDINGS Alignment: Alignment is anatomic Vertebrae: Edema and enhancement is present at C4-5, most prominent on the right and posteriorly. Vertebral body  heights are maintained. More subtle changes are present on the right at C6-7. Cord: Normal signal  is present in the cervical and upper thoracic spinal cord. Cord morphology is preserved. Posterior Fossa, vertebral arteries, paraspinal tissues: Craniocervical junction is normal. Flow is present in the vertebral arteries bilaterally. Visualized intracranial contents are normal. Disc levels: Previously noted ventral epidural collection on the right has expanded. There is diffuse enhancement associated with the collection. Collection is centered at C4-5 but extends superiorly to the C2 level. The collection extends inferiorly to C5-6. Abnormal enhancement extends to the C1-2 level on the right into C7 inferiorly. Abnormal signal and enhancement is present within the right neural foramina at C3-4, C4-5, and C5-6. Abnormal signal and enhancement is present about the C5-6 facet joint on the right. Dorsal epidural enhancement is present at C3-4 and C4-5. MRI THORACIC SPINE FINDINGS Alignment:  No significant listhesis is present. Vertebrae: Marrow signal is diffusely depressed. No discrete lesions are present. No pathologic enhancement is present. Conus medullaris and cauda equina: Conus extends to the L1 level. Conus and cauda equina appear normal. Paraspinal and other soft tissues: Paraspinous soft tissues are within normal limits. No enhancing soft tissue is present. Visualized lung fields are clear. Disc levels: No focal disc protrusion or stenosis is present. The central canal and foramina are patent throughout the thoracic spine. IMPRESSION: 1. Progressive enhancing epidural collection in the cervical spine centered at C4-5. Abnormal disc signal, endplate signal, and enhancement suggest disc osteomyelitis at this level. 2. Abnormal epidural fluid enhancement extending superiorly to C2-3 and inferiorly to C5. 3. Additional dural enhancement extends superiorly to the C1-2 level. 4. Subtle signal changes may represent  early discitis at C6-7. 5. Cervical paraspinal disease on the right with enhancement of paraspinous soft tissues including paraspinous musculature consistent with diffuse infection. 6. Abnormal signal and enhancement within the right cervical foramina at C3-4, C4-5, and C5-6. Electronically Signed   By: San Morelle M.D.   On: 02/11/2020 17:16   MR THORACIC SPINE W WO CONTRAST  Result Date: 02/11/2020 CLINICAL DATA:  Question epidural abscess. Recent falls and weakness. Hyperglycemia. MSSA bacteremia. Osteomyelitis of the foot. Previous abnormal MRI of the cervical spine with possible epidural abscess. EXAM: MRI CERVICAL AND LUMBAR SPINE WITHOUT AND WITH CONTRAST TECHNIQUE: Multiplanar and multiecho pulse sequences of the cervical spine, to include the craniocervical junction and cervicothoracic junction, and lumbar spine, were obtained without and with intravenous contrast. CONTRAST:  34m GADAVIST GADOBUTROL 1 MMOL/ML IV SOLN COMPARISON:  MRI of the cervical spine without contrast 02/01/2020. FINDINGS: MRI CERVICAL SPINE FINDINGS Alignment: Alignment is anatomic Vertebrae: Edema and enhancement is present at C4-5, most prominent on the right and posteriorly. Vertebral body heights are maintained. More subtle changes are present on the right at C6-7. Cord: Normal signal is present in the cervical and upper thoracic spinal cord. Cord morphology is preserved. Posterior Fossa, vertebral arteries, paraspinal tissues: Craniocervical junction is normal. Flow is present in the vertebral arteries bilaterally. Visualized intracranial contents are normal. Disc levels: Previously noted ventral epidural collection on the right has expanded. There is diffuse enhancement associated with the collection. Collection is centered at C4-5 but extends superiorly to the C2 level. The collection extends inferiorly to C5-6. Abnormal enhancement extends to the C1-2 level on the right into C7 inferiorly. Abnormal signal and  enhancement is present within the right neural foramina at C3-4, C4-5, and C5-6. Abnormal signal and enhancement is present about the C5-6 facet joint on the right. Dorsal epidural enhancement is present at C3-4 and C4-5. MRI THORACIC SPINE FINDINGS Alignment:  No significant listhesis  is present. Vertebrae: Marrow signal is diffusely depressed. No discrete lesions are present. No pathologic enhancement is present. Conus medullaris and cauda equina: Conus extends to the L1 level. Conus and cauda equina appear normal. Paraspinal and other soft tissues: Paraspinous soft tissues are within normal limits. No enhancing soft tissue is present. Visualized lung fields are clear. Disc levels: No focal disc protrusion or stenosis is present. The central canal and foramina are patent throughout the thoracic spine. IMPRESSION: 1. Progressive enhancing epidural collection in the cervical spine centered at C4-5. Abnormal disc signal, endplate signal, and enhancement suggest disc osteomyelitis at this level. 2. Abnormal epidural fluid enhancement extending superiorly to C2-3 and inferiorly to C5. 3. Additional dural enhancement extends superiorly to the C1-2 level. 4. Subtle signal changes may represent early discitis at C6-7. 5. Cervical paraspinal disease on the right with enhancement of paraspinous soft tissues including paraspinous musculature consistent with diffuse infection. 6. Abnormal signal and enhancement within the right cervical foramina at C3-4, C4-5, and C5-6. Electronically Signed   By: San Morelle M.D.   On: 02/11/2020 17:16   US Abdomen Complete  Result Date: 02/02/2020 CLINICAL DATA:  Elevated liver function tests. Renal insufficiency with elevated creatinine. EXAM: ABDOMEN ULTRASOUND COMPLETE COMPARISON:  None. FINDINGS: Gallbladder: Several gallstones are seen, largest measuring 1.2 cm. Gallbladder is incompletely distended. No abnormal gallbladder wall thickening or pericholecystic fluid noted. No  sonographic Murphy sign noted by sonographer. Common bile duct: Diameter: 3 mm, within normal limits. Liver: No focal lesion identified. Within normal limits in parenchymal echogenicity. Portal vein is patent on color Doppler imaging with normal direction of blood flow towards the liver. IVC: No abnormality visualized. Pancreas: Pancreas is not visualized due to overlying bowel gas. Spleen: Size and appearance within normal limits. Right Kidney: Length: 11.3 cm. Mildly increased parenchymal echogenicity noted. No mass or hydronephrosis visualized. Left Kidney: Length: 12.3 cm. Mildly increased parenchymal echogenicity noted. No mass or hydronephrosis visualized. Abdominal aorta: No aneurysm visualized, although distal abdominal aorta obscured by overlying bowel gas. Other findings: None. IMPRESSION: Cholelithiasis. No sonographic evidence of cholecystitis or biliary ductal dilatation. Mildly increased renal parenchymal echogenicity, consistent with medical renal disease. No evidence of hydronephrosis. Electronically Signed   By: Marlaine Hind M.D.   On: 02/02/2020 16:48   DG Hand 2 View Right  Result Date: 02/04/2020 CLINICAL DATA:  Pain EXAM: RIGHT HAND - 2 VIEW COMPARISON:  None. FINDINGS: There are degenerative changes at the proximal interphalangeal joints of the second, fourth, and fifth digits. The changes appear to be both erosive and productive. There are degenerative changes at the radiocarpal joint with subchondral cystic changes involving the scaphoid and lunate. Degenerative changes are noted at the first [facility]. Nonspecific flexion deformities are noted at the proximal interphalangeal joints of the second, fourth, and fifth digits. There is no acute displaced fracture or dislocation. IMPRESSION: 1. No acute displaced fracture or dislocation. 2. 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 3. Degenerative changes at the radiocarpal joint.  Electronically Signed   By: Constance Holster M.D.   On: 02/04/2020 20:23   DG Hand 2 View Left  Result Date: 02/04/2020 CLINICAL DATA:  Pain EXAM: LEFT HAND - 2 VIEW COMPARISON:  None. FINDINGS: There are mixed erosive and productive changes of the proximal interphalangeal joints of the third through fifth digits. There are degenerative changes at the fifth metacarpophalangeal joint. Degenerative changes are noted at the radiocarpal joint. There is no acute  displaced fracture or dislocation. IMPRESSION: 1. No acute displaced fracture or dislocation. 2. 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 Electronically Signed   By: Constance Holster M.D.   On: 02/04/2020 20:24   MR FOOT RIGHT WO CONTRAST  Result Date: 02/01/2020 CLINICAL DATA:  Osteomyelitis EXAM: MRI OF THE RIGHT FOREFOOT WITHOUT CONTRAST TECHNIQUE: Multiplanar, multisequence MR imaging of the right was performed. No intravenous contrast was administered. COMPARISON:  Radiograph November 29, 2007 FINDINGS: Bones/Joint/Cartilage There is increased marrow signal seen through the fifth metatarsal head, proximal fifth phalanx, fourth metatarsal head, and proximal fourth phalanx with associated T1 hypointensity. This could be due to early osteomyelitis. Increased marrow signal seen within the distal fourth and fifth phalanges, mid fifth metatarsal shaft, second and third proximal phalanges, likely due to reactive marrow. There is large cystic type lucency seen within the navicular as on prior exam. 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 There is also osseous coalition between the second metatarsal and intermediate cuneiform as well as the medial and lateral cuneiform is. 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 Ligaments The Lisfranc ligaments and collateral ligaments are  intact. Muscles and Tendons Diffusely increased signal seen throughout the muscles, likely due to microvascular disease and denervation atrophy. The flexor and extensor tendons appear to be intact. The plantar fascia is intact. Soft tissues There is focal area of superficial ulceration seen on the plantar surface of the midfoot between the fourth and fifth metatarsal heads measuring 1.7 cm in transverse dimension, series 5, image 28. No definite loculated fluid collection or sinus tract is seen. There is mild dorsal soft tissue swelling. IMPRESSION: 1. Focal area of superficial ulceration on the plantar surface beneath the fourth and fifth metatarsal heads. For no sinus tract or abscess. Findings which could be suggestive of osteomyelitis involving the fourth and fifth metatarsal heads and proximal phalanges. 2. Probable reactive marrow within the distal fourth and fifth phalanges, mid fifth metatarsal shaft, and second and third proximal phalanges. 3. Osseous coalition as described above. Electronically Signed   By: Prudencio Pair M.D.   On: 02/01/2020 22:12   MR FOOT LEFT WO CONTRAST  Result Date: 02/01/2020 CLINICAL DATA:  Osteomyelitis of the foot EXAM: MRI OF THE LEFT FOOT WITHOUT CONTRAST TECHNIQUE: Multiplanar, multisequence MR imaging of the left was performed. No intravenous contrast was administered. COMPARISON:  None. FINDINGS: Bones/Joint/Cartilage There is question of a tiny focal area of early erosive type change seen on the plantar surface of the distal phalanx series 4, image 44. Increased marrow signal seen throughout the distal phalanx of the first digit and the proximal first phalanx at the interphalangeal joint. No definite area of cortical destruction is seen. There is interphalangeal joint and MTP joint osteoarthritis at the first digit with joint space loss and subchondral cystic changes. There is osseous coalition seen at the second metatarsal cuneiform joint and as well with the medial and  intermediate cuneiform. No large joint effusion is noted. There is cystic changes seen at the talonavicular joint with joint space loss as well as at the naviculocuneiform joint. Ligaments The Lisfranc ligaments and collateral ligaments appear to be intact. Muscles and Tendons There is diffusely increased signal seen throughout the muscles, likely due to microvascular disease and denervation atrophy. The flexor and extensor tendons are intact. The plantar fascia is intact. Soft tissues There is  focal area of skin irregularity/superficial ulceration seen on the plantar surface of the distal first digit with skin thickening. There is diffuse subcutaneous edema. No loculated fluid collection or sinus tract however is seen. There is mild dorsal soft tissue swelling. IMPRESSION: Subtle skin ulceration with probable findings of cellulitis involving the distal digit of the first toe. No sinus tract or abscess is seen. There is question of early osteomyelitis involving the plantar surface of the distal first phalanx with a focal area of erosive type change. Probable reactive marrow seen throughout the distal and proximal first phalanx. 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 Osteoarthritis as described above. Electronically Signed   By: Prudencio Pair M.D.   On: 02/01/2020 22:06   DG Chest Portable 1 View  Result Date: 02/01/2020 CLINICAL DATA:  Generalized weakness, shortness of breath EXAM: PORTABLE CHEST 1 VIEW COMPARISON:  None FINDINGS: Heart is normal size. Lingular scarring. Right lung clear. No effusions or acute bony abnormality. IMPRESSION: No active disease. Electronically Signed   By: Rolm Baptise M.D.   On: 02/01/2020 09:49   VAS [place] ABI WITH/WO TBI  Result Date: 02/02/2020 LOWER EXTREMITY DOPPLER STUDY Indications: Ulceration, and bilateral foot ulcers. High Risk Factors: Hypertension, Diabetes.  Comparison Study: no prior Performing  Technologist: Abram Sander RVS  Examination Guidelines: A complete evaluation includes at minimum, Doppler waveform signals and systolic blood pressure reading at the level of bilateral brachial, anterior tibial, and posterior tibial arteries, when vessel segments are accessible. Bilateral testing is considered an integral part of a complete examination. Photoelectric Plethysmograph (PPG) waveforms and toe systolic pressure readings are included as required and additional duplex testing as needed. Limited examinations for reoccurring indications may be performed as noted.  ABI Findings: +---------+------------------+-----+---------+--------+ Right    Rt Pressure (mmHg)IndexWaveform Comment  +---------+------------------+-----+---------+--------+ Brachial 1434                   triphasic         +---------+------------------+-----+---------+--------+ PTA      117               0.82 triphasic         +---------+------------------+-----+---------+--------+ DP       144               1.01 triphasic         +---------+------------------+-----+---------+--------+ Great Toe68                0.05                   +---------+------------------+-----+---------+--------+ +---------+------------------+-----+---------+-------+ Left     Lt Pressure (mmHg)IndexWaveform Comment +---------+------------------+-----+---------+-------+ Brachial 134                    triphasic        +---------+------------------+-----+---------+-------+ PTA      123               0.86 triphasic        +---------+------------------+-----+---------+-------+ DP       127               0.89 triphasic        +---------+------------------+-----+---------+-------+ Great Toe68                0.05                  +---------+------------------+-----+---------+-------+ +-------+-----------+-----------+------------+------------+ ABI/TBIToday's ABIToday's TBIPrevious ABIPrevious TBI  +-------+-----------+-----------+------------+------------+ Right  1.10                                           +-------+-----------+-----------+------------+------------+ Left   0.89                                           +-------+-----------+-----------+------------+------------+  Summary: Right: Resting right ankle-brachial index is within normal range. No evidence of significant right lower extremity arterial disease. The right toe-brachial index is abnormal. Left: Resting left ankle-brachial index indicates mild left lower extremity arterial disease. The left toe-brachial index is abnormal.  *See table(s) above for measurements and observations.  Electronically signed by Monica Martinez MD on 02/02/2020 at 4:35:41 PM.    Final    ECHOCARDIOGRAM COMPLETE  Result Date: 02/02/2020    ECHOCARDIOGRAM REPORT   Patient Name:   [facility] Date of Exam: 02/02/2020 Medical Rec #:  [id number]     Height: Accession #:    [id number]    Weight: Date of Birth:  December 10, 1960    BSA: Patient Age:    79 years      BP:           122/63 mmHg Patient Gender: M             HR:           97 bpm. Exam Location:  Inpatient Procedure: 2D Echo Indications:    bacteremia 790.7  History:        Patient has no prior history of Echocardiogram examinations.                 Risk Factors:Diabetes and Hypertension.  Sonographer:    Jannett Celestine RDCS (AE) Referring Phys: [id number] [place]  1. Left ventricular ejection fraction, by estimation, is 65 to 70%. The left ventricle has normal function. The left ventricle has no regional wall motion abnormalities. There is mild left ventricular hypertrophy. Left ventricular diastolic parameters are consistent with Grade II diastolic dysfunction (pseudonormalization).  2. Right ventricular systolic function is normal. The right ventricular size is normal.  3. Left atrial size was moderately dilated.  4. The mitral valve is normal in structure and function. Trivial mitral valve  regurgitation.  5. The aortic valve is tricuspid. Aortic valve regurgitation is not visualized.  6. The inferior vena cava is dilated in size with >50% respiratory variability, suggesting right atrial pressure of 8 mmHg.  7. Technically limited sutdy due to poor acoustic windows. If suspicion is high for endocarditis would consider TEE to further evalaute. FINDINGS  Left Ventricle: Left ventricular ejection fraction, by estimation, is 65 to 70%. The left ventricle has normal function. The left ventricle has no regional wall motion abnormalities. The left ventricular internal cavity size was normal in size. There is  mild left ventricular hypertrophy. Left ventricular diastolic parameters are consistent with Grade II diastolic dysfunction (pseudonormalization). Right Ventricle: The right ventricular size is normal. No increase in right ventricular wall thickness. Right ventricular systolic function is normal. Left Atrium: Left atrial size was moderately dilated. Right Atrium: Right atrial size was normal in size. Pericardium: There is no evidence of pericardial effusion. Mitral Valve: The mitral valve is normal in structure and function. Trivial mitral valve regurgitation. Tricuspid Valve: The tricuspid valve is normal in structure. Tricuspid  valve regurgitation is trivial. Aortic Valve: The aortic valve is tricuspid. Aortic valve regurgitation is not visualized. There is mild calcification of the aortic valve. Pulmonic Valve: The pulmonic valve was grossly normal. Pulmonic valve regurgitation is trivial. Aorta: The aortic root and ascending aorta are structurally normal, with no evidence of dilitation. Venous: The inferior vena cava is dilated in size with greater than 50% respiratory variability, suggesting right atrial pressure of 8 mmHg. IAS/Shunts: No atrial level shunt detected by color flow Doppler.  LEFT VENTRICLE PLAX 2D LVIDd:         4.00 cm Diastology LV PW:         1.50 cm LV e' lateral:   6.85 cm/s LV  IVS:        1.30 cm LV E/e' lateral: 15.5                        LV e' medial:    7.51 cm/s                        LV E/e' medial:  14.1  RIGHT VENTRICLE RV Basal diam:  4.19 cm RV S prime:     150.00 cm/s TAPSE (M-mode): 1.9 cm LEFT ATRIUM LA diam:      4.70 cm LA Vol (A2C): 11.5 ml LA Vol (A4C): 23.7 ml  AORTIC VALVE LVOT Vmax:   104.00 cm/s LVOT VTI:    19.100 m MITRAL VALVE MV PHT:      2.78 msec MV E velocity: 106.00 cm/s MV A velocity: 8900.00 cm/s MV E/A ratio:  0.1 Glori Bickers MD Electronically signed by Glori Bickers MD Signature Date/Time: 02/02/2020/7:00:59 PM    Final    ECHO TEE  Result Date: 02/05/2020    TRANSESOPHOGEAL ECHO REPORT   Patient Name:   [facility] Date of Exam: 02/05/2020 Medical Rec #:  [id number]     Height:       68.0 in Accession #:    [id number]    Weight:       203.5 lb Date of Birth:  09-15-1961    BSA:          2.059 m Patient Age:    61 years      BP:           92/47 mmHg Patient Gender: M             HR:           81 bpm. Exam Location:  Inpatient Procedure: 2D Echo, Color Doppler and Cardiac Doppler Indications:     bacteremia 790.7  History:         Patient has prior history of Echocardiogram examinations, most                  recent 02/02/2020.  Sonographer:     Philipp Deputy Referring Phys:  [id number] [address] WEAVER Diagnosing Phys: Cherlynn Kaiser MD PROCEDURE: After discussion of the risks and benefits of a TEE, an informed consent was obtained from the patient. The transesophogeal probe was passed without difficulty through the esophogus of the patient. Local oropharyngeal anesthetic was provided with Cetacaine. Sedation performed by different physician. The patient was monitored while under deep sedation. Image quality was adequate. The patient's vital signs; including heart rate, blood pressure, and oxygen saturation; remained stable throughout  the procedure. The patient developed no complications during the procedure. IMPRESSIONS  1. Left ventricular ejection  fraction,  by estimation, is 60 to 65%. The left ventricle has normal function. The left ventricle has no regional wall motion abnormalities.  2. Right ventricular systolic function is normal. The right ventricular size is normal.  3. Left atrial size was mildly dilated. No left atrial/left atrial appendage thrombus was detected.  4. 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 Mild mitral valve regurgitation.  5. The aortic valve is normal in structure. Aortic valve regurgitation is not visualized.  6. There is mild (Grade II) atheroma plaque involving the transverse and descending aorta.  7. Evidence of atrial level shunting detected by color flow Doppler. Agitated saline contrast bubble study was positive with shunting observed within 3-6 cardiac cycles suggestive of interatrial shunt. There is a tiny patent foramen ovale with predominantly right to left shunting across the atrial septum. Conclusion(s)/Recommendation(s): No evidence of vegetation/infective endocarditis on this transesophageal echocardiogram. FINDINGS  Left Ventricle: Left ventricular ejection fraction, by estimation, is 60 to 65%. The left ventricle has normal function. The left ventricle has no regional wall motion abnormalities. The left ventricular internal cavity size was normal in size. There is  no left ventricular hypertrophy. Right Ventricle: The right ventricular size is normal. No increase in right ventricular wall thickness. Right ventricular systolic function is normal. Left Atrium: Left atrial size was mildly dilated. No left atrial/left atrial appendage thrombus was detected. Right Atrium: Right atrial size was normal in size. Pericardium: Trivial pericardial effusion is present. 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 Normal mobility of the mitral valve leaflets. Mild mitral valve regurgitation. Tricuspid Valve: The tricuspid valve is normal in structure. Tricuspid valve regurgitation is trivial. No  evidence of tricuspid stenosis. Aortic Valve: The aortic valve is normal in structure. Aortic valve regurgitation is not visualized. Pulmonic Valve: The pulmonic valve was normal in structure. Pulmonic valve regurgitation is trivial. Aorta: The aortic root and ascending aorta are structurally normal, with no evidence of dilitation. There is mild (Grade II) atheroma plaque involving the transverse and descending aorta. IAS/Shunts: The interatrial septum appears to be lipomatous. Evidence of atrial level shunting detected by color flow Doppler. Agitated saline contrast was given intravenously to evaluate for intracardiac shunting. Agitated saline contrast bubble study was positive with shunting observed within 3-6 cardiac cycles suggestive of interatrial shunt. A tiny patent foramen ovale is detected with predominantly right to left shunting across the atrial septum.   AORTA Ao Asc diam: 3.10 cm Cherlynn Kaiser MD Electronically signed by Cherlynn Kaiser MD Signature Date/Time: 02/05/2020/8:13:15 PM    Final    [place] EKG SITE RITE  Result Date: 02/06/2020 If Site Rite image not attached, placement could not be confirmed due to current cardiac rhythm.    Antimicrobials:   Cefazolin 3/5    Subjective: No events overnight resting comfortably in bed.  Objective: Vitals:   02/16/20 1645 02/16/20 2045 02/17/20 0450 02/17/20 0929  BP: 126/69 (!) 144/83 123/74 136/78  Pulse: 87 89 86 89  Resp:  18  18  Temp: 98.8 F (37.1 C) 98.4 F (36.9 C) 98.4 F (36.9 C) 97.6 F (36.4 C)  TempSrc: Oral Oral Oral Oral  SpO2: 99% 98% 99% 99%  Weight:      Height:        Intake/Output Summary (Last 24 hours) at 02/17/2020 1354 Last data filed at 02/17/2020 0801 Gross per 24 hour  Intake 2137.11 ml  Output 2695 ml  Net -557.89 ml   Filed Weights   02/10/20 2038  02/11/20 2127 02/15/20 2113  Weight: 89.8 kg 89.8 kg 77.1 kg    Examination:  General exam: Appears calm and comfortable  Respiratory system:  Clear to auscultation. Respiratory effort normal. Cardiovascular system: S1 & S2 heard, RRR. No JVD, murmurs, rubs, gallops or clicks. No pedal edema. Gastrointestinal system: Abdomen is nondistended, soft and nontender. No organomegaly or masses felt. Normal bowel sounds heard. Central nervous system: Alert and oriented. No new focal neurological deficits. Extremities: Mild edema. Skin: No new lesions Psychiatry: Judgement and insight appear normal. Mood & affect appropriate.     Data Reviewed: I have personally reviewed following labs and imaging studies  CBC: Recent Labs  Lab 02/13/20 0335 02/14/20 0201 02/15/20 0325 02/16/20 0402 02/17/20 0427  WBC 10.1 8.9 8.2 8.2 7.3  NEUTROABS 6.3 5.8 4.6 5.0 4.4  HGB 9.0* 9.5* 8.8* 8.7* 8.6*  HCT 29.2* 31.0* 28.3* 28.7* 28.7*  MCV 71.9* 72.8* 73.3* 73.2* 73.4*  PLT 442* 454* 410* 387 725   Basic Metabolic Panel: Recent Labs  Lab 02/13/20 0335 02/15/20 0325  NA 132* 134*  K 4.5 4.3  CL 98 103  CO2 24 22  GLUCOSE 215* 195*  BUN 31* 31*  CREATININE 1.27* 1.20  CALCIUM 8.5* 8.5*   GFR: Estimated Creatinine Clearance: 65.7 mL/min (by C-G formula based on SCr of 1.2 mg/dL). Liver Function Tests: Recent Labs  Lab 02/13/20 0335 02/15/20 0325  AST 31 23  ALT 24 17  ALKPHOS 160* 154*  BILITOT 0.3 0.2*  PROT 6.8 6.3*  ALBUMIN 1.8* 1.9*   No results for input(s): LIPASE, AMYLASE in the last 168 hours. No results for input(s): AMMONIA in the last 168 hours. Coagulation Profile: No results for input(s): INR, PROTIME in the last 168 hours. Cardiac Enzymes: No results for input(s): CKTOTAL, CKMB, CKMBINDEX, TROPONINI in the last 168 hours. BNP (last 3 results) No results for input(s): PROBNP in the last 8760 hours. HbA1C: No results for input(s): HGBA1C in the last 72 hours. CBG: Recent Labs  Lab 02/16/20 1127 02/16/20 1643 02/16/20 2047 02/17/20 0635 02/17/20 1119  GLUCAP 136* 143* 136* 185* 151*   Lipid  Profile: No results for input(s): CHOL, HDL, LDLCALC, TRIG, CHOLHDL, LDLDIRECT in the last 72 hours. Thyroid Function Tests: No results for input(s): TSH, T4TOTAL, FREET4, T3FREE, THYROIDAB in the last 72 hours. Anemia Panel: No results for input(s): VITAMINB12, FOLATE, FERRITIN, TIBC, IRON, RETICCTPCT in the last 72 hours. Sepsis Labs: No results for input(s): PROCALCITON, LATICACIDVEN in the last 168 hours.  Recent Results (from the past 240 hour(s))  Culture, blood (routine x 2)     Status: None   Collection Time: 02/09/20  1:38 PM   Specimen: BLOOD  Result Value Ref Range Status   Specimen Description BLOOD LEFT ANTECUBITAL  Final   Special Requests AEROBIC BOTTLE ONLY Blood Culture adequate volume  Final   Culture   Final    NO GROWTH 5 DAYS Performed at [facility], [address], [place], [place] [address]    Report Status 02/14/2020 FINAL  Final  Culture, blood (routine x 2)     Status: None   Collection Time: 02/09/20  1:39 PM   Specimen: BLOOD LEFT HAND  Result Value Ref Range Status   Specimen Description BLOOD LEFT HAND  Final   Special Requests   Final    BOTTLES DRAWN AEROBIC AND ANAEROBIC Blood Culture adequate volume   Culture   Final    NO GROWTH 5 DAYS Performed at  [facility], [place] [address], [place], [place] [address]    Report Status 02/14/2020 FINAL  Final         Radiology Studies: No results found.      Scheduled Meds: . Chlorhexidine Gluconate Cloth  6 each Topical Daily  . feeding supplement (GLUCERNA SHAKE)  237 mL Oral TID BM  . feeding supplement (PRO-STAT SUGAR FREE 64)  30 mL Oral BID  . insulin aspart  0-15 Units Subcutaneous TID WC  . insulin aspart  0-5 Units Subcutaneous QHS  . insulin aspart protamine- aspart  8 Units Subcutaneous BID WC  . insulin starter kit- pen needles  1 kit Other Once  . multivitamin with minerals  1 tablet Oral Daily  . tamsulosin  0.4 mg Oral Daily  . vitamin B-12  1,000 mcg Oral Daily    Continuous Infusions: . sodium chloride 50 mL/hr at 02/16/20 2125  .  ceFAZolin (ANCEF) IV 2 g (02/17/20 0601)  . lactated ringers Stopped (02/03/20 1146)  . methocarbamol (ROBAXIN) IV       LOS: 16 days    Time spent: 35 minutes    Nicolette Bang, MD [facility]  If 7PM-7AM, please contact night-coverage  02/17/2020, 1:55 PM

## 2020-02-18 LAB — BASIC METABOLIC PANEL
Anion gap: 7 (ref 5–15)
BUN: 21 mg/dL — ABNORMAL HIGH (ref 6–20)
CO2: 23 mmol/L (ref 22–32)
Calcium: 8.6 mg/dL — ABNORMAL LOW (ref 8.9–10.3)
Chloride: 107 mmol/L (ref 98–111)
Creatinine, Ser: 0.9 mg/dL (ref 0.61–1.24)
GFR calc Af Amer: >60 mL/min (ref >60)
GFR calc non Af Amer: >60 mL/min (ref >60)
Glucose, Bld: 151 mg/dL — ABNORMAL HIGH (ref 70–99)
Potassium: 4.2 mmol/L (ref 3.5–5.1)
Sodium: 137 mmol/L (ref 135–145)

## 2020-02-18 LAB — GLUCOSE, CAPILLARY
Glucose-Capillary: 156 mg/dL — ABNORMAL HIGH (ref 70–99)
Glucose-Capillary: 165 mg/dL — ABNORMAL HIGH (ref 70–99)
Glucose-Capillary: 167 mg/dL — ABNORMAL HIGH (ref 70–99)
Glucose-Capillary: 164 mg/dL — ABNORMAL HIGH (ref 70–99)

## 2020-02-18 LAB — CBC WITH DIFFERENTIAL/PLATELET
Abs Immature Granulocytes: 0.02 10*3/uL (ref 0.00–0.07)
Basophils Absolute: 0.1 10*3/uL (ref 0.0–0.1)
Basophils Relative: 1 %
Eosinophils Absolute: 0.2 10*3/uL (ref 0.0–0.5)
Eosinophils Relative: 3 %
HCT: 28.2 % — ABNORMAL LOW (ref 39.0–52.0)
Hemoglobin: 8.6 g/dL — ABNORMAL LOW (ref 13.0–17.0)
Immature Granulocytes: 0 %
Lymphocytes Relative: 31 %
Lymphs Abs: 2.1 10*3/uL (ref 0.7–4.0)
MCH: 22.4 pg — ABNORMAL LOW (ref 26.0–34.0)
MCHC: 30.5 g/dL (ref 30.0–36.0)
MCV: 73.4 fL — ABNORMAL LOW (ref 80.0–100.0)
Monocytes Absolute: 0.6 10*3/uL (ref 0.1–1.0)
Monocytes Relative: 9 %
Neutro Abs: 3.7 10*3/uL (ref 1.7–7.7)
Neutrophils Relative %: 56 %
Platelets: 370 10*3/uL (ref 150–400)
RBC: 3.84 MIL/uL — ABNORMAL LOW (ref 4.22–5.81)
RDW: 16 % — ABNORMAL HIGH (ref 11.5–15.5)
WBC: 6.7 10*3/uL (ref 4.0–10.5)
nRBC: 0 % (ref 0.0–0.2)

## 2020-02-18 NOTE — Progress Notes (Signed)
PROGRESS NOTE    Chris Bowman  MOQ:[id number] DOB: 1960-12-02 DOA: 02/01/2020 PCP: Patient, No Pcp Per   Brief Narrative:  59 year old male with UNknown medical history, who has not seen a doctor since 2018 presented to the ER on 3/4 with weakness and falls x2 weeks and associated fever dyspnea cough, chronic buttock pain since the fall along with limitation of range of motion, right arm pain. Apparently diagnosed with diabetes in thousand and but did not think he needed medicine for it, reported history of taking too many baby aspirin and having stomach pain" I have a little incision is scar from that" in the ED afebrile Covid negative WBC 20.4K glucose 300 A1c 11 sodium 120,patient was admitted under hospitalist.Work-up showed MSSA UTI, bacteremia bilateral foot osteomyelitis with associated sepsis and concern for cervical spine epidural abscess/phlegmon C3-C5 no cord compression, no evidence of discitis or osteomyelitis.Patient was seen by neurosurgery, infectious disease.Patient underwent TEE-negative for endocarditis, MRI spine discitis/osteo-/small phlegmon, is a status post I&D of the left toe, 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 Patient is on Ancef as per ID and will need 6 weeks of IV antibiotic, repeat blood culture on 3/6 and PICC line 3/10.On 3/8 urine retention needing in and out cath and subsequently needing Foley catheter. 3/11 temp spike 101 and 3/10 100.5. ID reconsulted and Blood cx/MRI c-t spine orderedno extension of infection inthoracic area advised to continue antibiotic. 3/17, 3/18-episodes of orthostatic hypotension needing IV boluses.   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 S/p TEE negative for endocarditis, normal EF with tiny PFO with right to left shunt, status post I&D of ulcer on the left great toe, s/p TMA ray amputation of right foot with wound VAC placement 3/6-wound VAC out 3/11 and healing well on daily dry dressing. Neurosurgery recommends 6 weeks to 3 months and repeat MRI C-spine with and without contrast in 6 weeks or sooner if progressive myelopathy develops or if develops worse back pain will need MRI lumbar spine with and without contrast.repeat blood culture and ID eval 3/12-negative. Repeat CAT MRI with and without contrast no extension into thoracic area, some expected progression this early in cervical area and no changes otherwise in treatment except IV antibiotics.I had discussed with neurosurgery PA Costella on-call 3/14 and notifed Dr. Marcello Moores from neurosurgery too. NO CHANGES-Continue Ancef plan for 6 weeks and reassess- so, he will need to follow-up with infectious disease.  Episode of hypotension: RESOLVED, Etiology likely from poor intake. Status post boluses IV fluid 3/17, 3/18. Pressure is stable.   Multiple bowel movements, stopped laxatives, c/w as needed Imodium. Improved  Scalene Myositis: Prevertebral edema with right-sided paravertebral fluid collections in the mid to lower cervical spine concerning for infection and abscesses with suspected myositis involving the right-sided scalene musculature noted on MRI. Case discussed with ENT Dr. Wilburn Cornelia who recommend antibiotic  treatment,no surgical intervention needed  Urine retentioncontinue supportive care.Placed on indwelling  Foley- placed 3/8 when he had 700 ml urine on bladder scan. Foley catheter discontinued 3/18-onvoiding trial. Thisis2ndremoval.  AKI secondary to [place] the setting of sepsis.No obstruction in the ultrasound. 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 Monitor renal function closely. Creat at stable at 1.2 monitor.   Type 2 diabetes mellitus with hyperglycemia,without long-term current use of insulin: hba1c uncontrolled at 11.3. Started on Levemir and changed to insulin 70/30,Sugar fairly controlled. Appreciate diabetic coordinator follow-up. monitor and adjust insulin. pt/sister has been educated on insulin use.Marland Kitchen  Ulcer of left foot due to type 2 diabetes mellitus cont wound care  Hyponatremia:Likely from hypovolemia. Sodium improved,  Generalized weakness/frequent fall : Continue PT OT and will need a skilled nursing facility placement. SW working on it. Seen by neurology sent B1 B6 level, which are WNL, MMA slightly up at 403,.  Transaminitis with ALP elevation. AST/ALT also elevated mildly in 50s now, improving.Hepatitis panel negative. Likely from sepsis. Ultrasound shows cholelithiasis but no obstruction. Monitor.  Microcytic anemia B12 normal folate normal TSH normal iron panel, with elevated ferritin 543, iron low at 12, TIBC low at 174.  Delusional disorder/impaired memory MRI brain showed mild cerebral atrophy and chronic small vessel ischemia.Currently patient is alert awake oriented, follows commands. Has some tangential thought process. HIV screen negative. he reports he is feeling much more clear and able to think better. .  Nutrition: Nutrition Problem: Increased nutrient needs Etiology: wound healing Signs/Symptoms: estimated needs Interventions: Glucerna shake, MVI Body mass index is 25.84 kg/m.  DVT prophylaxis: SCD/Compression stockings  Code Status: FULL    Code Status Orders  (From admission, onward)         Start     Ordered    02/01/20 1146  Full code  Continuous     02/01/20 1149        Code Status History    This patient has a current code status but no historical code status.   Advance Care Planning Activity     Family Communication: Plan of care discussed with patient at bedside Disposition Plan:    Patient with sepsis and foot osteomyelitis no safe discharge plan get available, family working on [employer] application with Development worker, community.  Consults called: None Admission status: Inpatient   Consultants:   Infectious disease, neurology, cardiology for TEE, neurosurgery, orthopedics  Procedures:  DG Shoulder Right  Result Date: 02/01/2020 CLINICAL DATA:  59 year old male with fall and right shoulder pain. EXAM: RIGHT SHOULDER - 2+ VIEW COMPARISON:  Chest radiograph dated 02/01/2020. FINDINGS: There is no acute fracture or dislocation. No significant arthritic changes. Mild elevation of the right humeral head may represent chronic rotator cuff injury. There is degenerative changes of the right AC joint. The soft tissues are unremarkable. IMPRESSION: No acute fracture or dislocation. Electronically Signed   By: Anner Crete M.D.   On: 02/01/2020 19:17   MR BRAIN WO CONTRAST  Result Date: 02/01/2020 CLINICAL DATA:  Generalized weakness, gait instability, and multiple falls. EXAM: MRI HEAD WITHOUT CONTRAST MRI CERVICAL SPINE WITHOUT CONTRAST TECHNIQUE: Multiplanar, multiecho pulse sequences of the brain and surrounding structures, and cervical spine, to include the craniocervical junction and cervicothoracic junction, were obtained without intravenous contrast. COMPARISON:  None. FINDINGS: MRI HEAD FINDINGS The study is mildly motion degraded. Brain: There is no evidence of acute infarct, intracranial hemorrhage, mass, midline shift, or extra-axial fluid collection. There is mild cerebral atrophy. Periventricular white matter T2 hyperintensities are nonspecific but  compatible with minimal chronic small  vessel ischemic disease. Vascular: Major intracranial vascular flow voids are preserved. Skull and upper cervical spine: No suspicious marrow lesion. Sinuses/Orbits: Unremarkable orbits. Minimal left ethmoid air cell mucosal thickening. At most trace right mastoid fluid. Other: None. MRI CERVICAL SPINE FINDINGS The study is motion degraded throughout including severe motion on the axial sequences. Alignment: No significant listhesis. Vertebrae: No fracture or suspicious marrow lesion. Chronic degenerative endplate changes at R0-0 associated with severe disc space narrowing. Moderate disc space narrowing at C4-5. No discal fluid signal or frank vertebral marrow edema to indicate discitis or osteomyelitis. Small right facet joint effusion at C5-6 without substantial facet marrow edema. Cord: No cord signal abnormality identified within limitations of motion artifact. Posterior Fossa, vertebral arteries, paraspinal tissues: There is prevertebral edema, and there are apparent fluid collections along the right anterolateral aspects of the C3-C5 vertebral bodies measuring up to approximately 4 cm in craniocaudal length and with a maximal transverse diameter of approximately 1.3 cm. There is a diffusely edematous appearance of the right scalene musculature more inferiorly in the neck. The vertebral artery flow voids are grossly preserved bilaterally. Disc levels: A small ventral epidural fluid collection is suspected asymmetrically to the right of midline extending from the C3 inferior to C5 superior endplate levels (for example series 5, image 7 and series 10, image 22). This measures 2-2.5 cm in craniocaudal length and less than 1 cm in transverse dimension and contributes to mild spinal stenosis at these levels without frank cord compression. Detailed assessment of degenerative changes is limited by motion, however there is no significant spinal stenosis elsewhere. Disc bulging and uncovertebral spurring result in  neural foraminal stenosis which is likely moderate bilaterally at C3-4, severe on the right and moderate to severe on the left at C4-5, mild on the right at C5-6, and moderate on the right at C6-7. IMPRESSION: MRI HEAD: 1. No acute intracranial abnormality. 2. Mild cerebral atrophy and chronic small vessel ischemia. MRI CERVICAL SPINE: 1. Severely motion degraded examination. 2. Prevertebral edema with right-sided paravertebral fluid collections in the mid to lower cervical spine concerning for infection and abscesses with suspected myositis involving the right-sided scalene musculature. 3. Suspected small ventral epidural abscess or phlegmon from C3-C5 with mild spinal stenosis. No cord compression. 4. No definite evidence of discitis or osteomyelitis. 5. Small volume facet joint fluid on the right at C5-6 is favored to be degenerative although septic arthritis is not excluded. These results will be called to the ordering clinician or representative by the Radiologist Assistant, and communication documented in the PACS or zVision Dashboard. Electronically Signed   By: Logan Bores M.D.   On: 02/01/2020 19:38   MR CERVICAL SPINE WO CONTRAST  Result Date: 02/01/2020 CLINICAL DATA:  Generalized weakness, gait instability, and multiple falls. EXAM: MRI HEAD WITHOUT CONTRAST MRI CERVICAL SPINE WITHOUT CONTRAST TECHNIQUE: Multiplanar, multiecho pulse sequences of the brain and surrounding structures, and cervical spine, to include the craniocervical junction and cervicothoracic junction, were obtained without intravenous contrast. COMPARISON:  None. FINDINGS: MRI HEAD FINDINGS The study is mildly motion degraded. Brain: There is no evidence of acute infarct, intracranial hemorrhage, mass, midline shift, or extra-axial fluid collection. There is mild cerebral atrophy. Periventricular white matter T2 hyperintensities are nonspecific but compatible with minimal chronic small vessel ischemic disease. Vascular: Major  intracranial vascular flow voids are preserved. Skull and upper cervical spine: No suspicious marrow lesion. Sinuses/Orbits: Unremarkable orbits. Minimal left ethmoid air cell mucosal thickening. At most  trace right mastoid fluid. Other: None. MRI CERVICAL SPINE FINDINGS The study is motion degraded throughout including severe motion on the axial sequences. Alignment: No significant listhesis. Vertebrae: No fracture or suspicious marrow lesion. Chronic degenerative endplate changes at J1-9 associated with severe disc space narrowing. Moderate disc space narrowing at C4-5. No discal fluid signal or frank vertebral marrow edema to indicate discitis or osteomyelitis. Small right facet joint effusion at C5-6 without substantial facet marrow edema. Cord: No cord signal abnormality identified within limitations of motion artifact. Posterior Fossa, vertebral arteries, paraspinal tissues: There is prevertebral edema, and there are apparent fluid collections along the right anterolateral aspects of the C3-C5 vertebral bodies measuring up to approximately 4 cm in craniocaudal length and with a maximal transverse diameter of approximately 1.3 cm. There is a diffusely edematous appearance of the right scalene musculature more inferiorly in the neck. The vertebral artery flow voids are grossly preserved bilaterally. Disc levels: A small ventral epidural fluid collection is suspected asymmetrically to the right of midline extending from the C3 inferior to C5 superior endplate levels (for example series 5, image 7 and series 10, image 22). This measures 2-2.5 cm in craniocaudal length and less than 1 cm in transverse dimension and contributes to mild spinal stenosis at these levels without frank cord compression. Detailed assessment of degenerative changes is limited by motion, however there is no significant spinal stenosis elsewhere. Disc bulging and uncovertebral spurring result in neural foraminal stenosis which is likely  moderate bilaterally at C3-4, severe on the right and moderate to severe on the left at C4-5, mild on the right at C5-6, and moderate on the right at C6-7. IMPRESSION: MRI HEAD: 1. No acute intracranial abnormality. 2. Mild cerebral atrophy and chronic small vessel ischemia. MRI CERVICAL SPINE: 1. Severely motion degraded examination. 2. Prevertebral edema with right-sided paravertebral fluid collections in the mid to lower cervical spine concerning for infection and abscesses with suspected myositis involving the right-sided scalene musculature. 3. Suspected small ventral epidural abscess or phlegmon from C3-C5 with mild spinal stenosis. No cord compression. 4. No definite evidence of discitis or osteomyelitis. 5. Small volume facet joint fluid on the right at C5-6 is favored to be degenerative although septic arthritis is not excluded. These results will be called to the ordering clinician or representative by the Radiologist Assistant, and communication documented in the PACS or zVision Dashboard. Electronically Signed   By: Logan Bores M.D.   On: 02/01/2020 19:38   MR CERVICAL SPINE W WO CONTRAST  Result Date: 02/11/2020 CLINICAL DATA:  Question epidural abscess. Recent falls and weakness. Hyperglycemia. MSSA bacteremia. Osteomyelitis of the foot. Previous abnormal MRI of the cervical spine with possible epidural abscess. EXAM: MRI CERVICAL AND LUMBAR SPINE WITHOUT AND WITH CONTRAST TECHNIQUE: Multiplanar and multiecho pulse sequences of the cervical spine, to include the craniocervical junction and cervicothoracic junction, and lumbar spine, were obtained without and with intravenous contrast. CONTRAST:  47m GADAVIST GADOBUTROL 1 MMOL/ML IV SOLN COMPARISON:  MRI of the cervical spine without contrast 02/01/2020. FINDINGS: MRI CERVICAL SPINE FINDINGS Alignment: Alignment is anatomic Vertebrae: Edema and enhancement is present at C4-5, most prominent on the right and posteriorly. Vertebral body heights are  maintained. More subtle changes are present on the right at C6-7. Cord: Normal signal is present in the cervical and upper thoracic spinal cord. Cord morphology is preserved. Posterior Fossa, vertebral arteries, paraspinal tissues: Craniocervical junction is normal. Flow is present in the vertebral arteries bilaterally. Visualized intracranial contents are normal.  Disc levels: Previously noted ventral epidural collection on the right has expanded. There is diffuse enhancement associated with the collection. Collection is centered at C4-5 but extends superiorly to the C2 level. The collection extends inferiorly to C5-6. Abnormal enhancement extends to the C1-2 level on the right into C7 inferiorly. Abnormal signal and enhancement is present within the right neural foramina at C3-4, C4-5, and C5-6. Abnormal signal and enhancement is present about the C5-6 facet joint on the right. Dorsal epidural enhancement is present at C3-4 and C4-5. MRI THORACIC SPINE FINDINGS Alignment:  No significant listhesis is present. Vertebrae: Marrow signal is diffusely depressed. No discrete lesions are present. No pathologic enhancement is present. Conus medullaris and cauda equina: Conus extends to the L1 level. Conus and cauda equina appear normal. Paraspinal and other soft tissues: Paraspinous soft tissues are within normal limits. No enhancing soft tissue is present. Visualized lung fields are clear. Disc levels: No focal disc protrusion or stenosis is present. The central canal and foramina are patent throughout the thoracic spine. IMPRESSION: 1. Progressive enhancing epidural collection in the cervical spine centered at C4-5. Abnormal disc signal, endplate signal, and enhancement suggest disc osteomyelitis at this level. 2. Abnormal epidural fluid enhancement extending superiorly to C2-3 and inferiorly to C5. 3. Additional dural enhancement extends superiorly to the C1-2 level. 4. Subtle signal changes may represent early discitis  at C6-7. 5. Cervical paraspinal disease on the right with enhancement of paraspinous soft tissues including paraspinous musculature consistent with diffuse infection. 6. Abnormal signal and enhancement within the right cervical foramina at C3-4, C4-5, and C5-6. Electronically Signed   By: San Morelle M.D.   On: 02/11/2020 17:16   MR THORACIC SPINE W WO CONTRAST  Result Date: 02/11/2020 CLINICAL DATA:  Question epidural abscess. Recent falls and weakness. Hyperglycemia. MSSA bacteremia. Osteomyelitis of the foot. Previous abnormal MRI of the cervical spine with possible epidural abscess. EXAM: MRI CERVICAL AND LUMBAR SPINE WITHOUT AND WITH CONTRAST TECHNIQUE: Multiplanar and multiecho pulse sequences of the cervical spine, to include the craniocervical junction and cervicothoracic junction, and lumbar spine, were obtained without and with intravenous contrast. CONTRAST:  43m GADAVIST GADOBUTROL 1 MMOL/ML IV SOLN COMPARISON:  MRI of the cervical spine without contrast 02/01/2020. FINDINGS: MRI CERVICAL SPINE FINDINGS Alignment: Alignment is anatomic Vertebrae: Edema and enhancement is present at C4-5, most prominent on the right and posteriorly. Vertebral body heights are maintained. More subtle changes are present on the right at C6-7. Cord: Normal signal is present in the cervical and upper thoracic spinal cord. Cord morphology is preserved. Posterior Fossa, vertebral arteries, paraspinal tissues: Craniocervical junction is normal. Flow is present in the vertebral arteries bilaterally. Visualized intracranial contents are normal. Disc levels: Previously noted ventral epidural collection on the right has expanded. There is diffuse enhancement associated with the collection. Collection is centered at C4-5 but extends superiorly to the C2 level. The collection extends inferiorly to C5-6. Abnormal enhancement extends to the C1-2 level on the right into C7 inferiorly. Abnormal signal and enhancement is  present within the right neural foramina at C3-4, C4-5, and C5-6. Abnormal signal and enhancement is present about the C5-6 facet joint on the right. Dorsal epidural enhancement is present at C3-4 and C4-5. MRI THORACIC SPINE FINDINGS Alignment:  No significant listhesis is present. Vertebrae: Marrow signal is diffusely depressed. No discrete lesions are present. No pathologic enhancement is present. Conus medullaris and cauda equina: Conus extends to the L1 level. Conus and cauda equina appear normal. Paraspinal and  other soft tissues: Paraspinous soft tissues are within normal limits. No enhancing soft tissue is present. Visualized lung fields are clear. Disc levels: No focal disc protrusion or stenosis is present. The central canal and foramina are patent throughout the thoracic spine. IMPRESSION: 1. Progressive enhancing epidural collection in the cervical spine centered at C4-5. Abnormal disc signal, endplate signal, and enhancement suggest disc osteomyelitis at this level. 2. Abnormal epidural fluid enhancement extending superiorly to C2-3 and inferiorly to C5. 3. Additional dural enhancement extends superiorly to the C1-2 level. 4. Subtle signal changes may represent early discitis at C6-7. 5. Cervical paraspinal disease on the right with enhancement of paraspinous soft tissues including paraspinous musculature consistent with diffuse infection. 6. Abnormal signal and enhancement within the right cervical foramina at C3-4, C4-5, and C5-6. Electronically Signed   By: San Morelle M.D.   On: 02/11/2020 17:16   US Abdomen Complete  Result Date: 02/02/2020 CLINICAL DATA:  Elevated liver function tests. Renal insufficiency with elevated creatinine. EXAM: ABDOMEN ULTRASOUND COMPLETE COMPARISON:  None. FINDINGS: Gallbladder: Several gallstones are seen, largest measuring 1.2 cm. Gallbladder is incompletely distended. No abnormal gallbladder wall thickening or pericholecystic fluid noted. No sonographic  Murphy sign noted by sonographer. Common bile duct: Diameter: 3 mm, within normal limits. Liver: No focal lesion identified. Within normal limits in parenchymal echogenicity. Portal vein is patent on color Doppler imaging with normal direction of blood flow towards the liver. IVC: No abnormality visualized. Pancreas: Pancreas is not visualized due to overlying bowel gas. Spleen: Size and appearance within normal limits. Right Kidney: Length: 11.3 cm. Mildly increased parenchymal echogenicity noted. No mass or hydronephrosis visualized. Left Kidney: Length: 12.3 cm. Mildly increased parenchymal echogenicity noted. No mass or hydronephrosis visualized. Abdominal aorta: No aneurysm visualized, although distal abdominal aorta obscured by overlying bowel gas. Other findings: None. IMPRESSION: Cholelithiasis. No sonographic evidence of cholecystitis or biliary ductal dilatation. Mildly increased renal parenchymal echogenicity, consistent with medical renal disease. No evidence of hydronephrosis. Electronically Signed   By: Marlaine Hind M.D.   On: 02/02/2020 16:48   DG Hand 2 View Right  Result Date: 02/04/2020 CLINICAL DATA:  Pain EXAM: RIGHT HAND - 2 VIEW COMPARISON:  None. FINDINGS: There are degenerative changes at the proximal interphalangeal joints of the second, fourth, and fifth digits. The changes appear to be both erosive and productive. There are degenerative changes at the radiocarpal joint with subchondral cystic changes involving the scaphoid and lunate. Degenerative changes are noted at the first [facility]. Nonspecific flexion deformities are noted at the proximal interphalangeal joints of the second, fourth, and fifth digits. There is no acute displaced fracture or dislocation. IMPRESSION: 1. No acute displaced fracture or dislocation. 2. 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 3. Degenerative changes at the radiocarpal joint.  Electronically Signed   By: Constance Holster M.D.   On: 02/04/2020 20:23   DG Hand 2 View Left  Result Date: 02/04/2020 CLINICAL DATA:  Pain EXAM: LEFT HAND - 2 VIEW COMPARISON:  None. FINDINGS: There are mixed erosive and productive changes of the proximal interphalangeal joints of the third through fifth digits. There are degenerative changes at the fifth metacarpophalangeal joint. Degenerative changes are noted at the radiocarpal joint. There is no acute displaced fracture or dislocation. IMPRESSION: 1. No acute displaced fracture or dislocation. 2. 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 Electronically Signed   By: Constance Holster M.D.   On: 02/04/2020 20:24   MR FOOT RIGHT  WO CONTRAST  Result Date: 02/01/2020 CLINICAL DATA:  Osteomyelitis EXAM: MRI OF THE RIGHT FOREFOOT WITHOUT CONTRAST TECHNIQUE: Multiplanar, multisequence MR imaging of the right was performed. No intravenous contrast was administered. COMPARISON:  Radiograph November 29, 2007 FINDINGS: Bones/Joint/Cartilage There is increased marrow signal seen through the fifth metatarsal head, proximal fifth phalanx, fourth metatarsal head, and proximal fourth phalanx with associated T1 hypointensity. This could be due to early osteomyelitis. Increased marrow signal seen within the distal fourth and fifth phalanges, mid fifth metatarsal shaft, second and third proximal phalanges, likely due to reactive marrow. There is large cystic type lucency seen within the navicular as on prior exam. 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 There is also osseous coalition between the second metatarsal and intermediate cuneiform as well as the medial and lateral cuneiform is. 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 Ligaments The Lisfranc ligaments and collateral ligaments are  intact. Muscles and Tendons Diffusely increased signal seen throughout the muscles, likely due to microvascular disease and denervation atrophy. The flexor and extensor tendons appear to be intact. The plantar fascia is intact. Soft tissues There is focal area of superficial ulceration seen on the plantar surface of the midfoot between the fourth and fifth metatarsal heads measuring 1.7 cm in transverse dimension, series 5, image 28. No definite loculated fluid collection or sinus tract is seen. There is mild dorsal soft tissue swelling. IMPRESSION: 1. Focal area of superficial ulceration on the plantar surface beneath the fourth and fifth metatarsal heads. For no sinus tract or abscess. Findings which could be suggestive of osteomyelitis involving the fourth and fifth metatarsal heads and proximal phalanges. 2. Probable reactive marrow within the distal fourth and fifth phalanges, mid fifth metatarsal shaft, and second and third proximal phalanges. 3. Osseous coalition as described above. Electronically Signed   By: Prudencio Pair M.D.   On: 02/01/2020 22:12   MR FOOT LEFT WO CONTRAST  Result Date: 02/01/2020 CLINICAL DATA:  Osteomyelitis of the foot EXAM: MRI OF THE LEFT FOOT WITHOUT CONTRAST TECHNIQUE: Multiplanar, multisequence MR imaging of the left was performed. No intravenous contrast was administered. COMPARISON:  None. FINDINGS: Bones/Joint/Cartilage There is question of a tiny focal area of early erosive type change seen on the plantar surface of the distal phalanx series 4, image 44. Increased marrow signal seen throughout the distal phalanx of the first digit and the proximal first phalanx at the interphalangeal joint. No definite area of cortical destruction is seen. There is interphalangeal joint and MTP joint osteoarthritis at the first digit with joint space loss and subchondral cystic changes. There is osseous coalition seen at the second metatarsal cuneiform joint and as well with the medial and  intermediate cuneiform. No large joint effusion is noted. There is cystic changes seen at the talonavicular joint with joint space loss as well as at the naviculocuneiform joint. Ligaments The Lisfranc ligaments and collateral ligaments appear to be intact. Muscles and Tendons There is diffusely increased signal seen throughout the muscles, likely due to microvascular disease and denervation atrophy. The flexor and extensor tendons are intact. The plantar fascia is intact. Soft tissues There is focal area of skin irregularity/superficial ulceration seen on the plantar surface of the distal first digit with skin thickening. There is diffuse subcutaneous edema. No loculated fluid collection or sinus tract however is seen. There is mild  dorsal soft tissue swelling. IMPRESSION: Subtle skin ulceration with probable findings of cellulitis involving the distal digit of the first toe. No sinus tract or abscess is seen. There is question of early osteomyelitis involving the plantar surface of the distal first phalanx with a focal area of erosive type change. Probable reactive marrow seen throughout the distal and proximal first phalanx. 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 Osteoarthritis as described above. Electronically Signed   By: Prudencio Pair M.D.   On: 02/01/2020 22:06   DG Chest Portable 1 View  Result Date: 02/01/2020 CLINICAL DATA:  Generalized weakness, shortness of breath EXAM: PORTABLE CHEST 1 VIEW COMPARISON:  None FINDINGS: Heart is normal size. Lingular scarring. Right lung clear. No effusions or acute bony abnormality. IMPRESSION: No active disease. Electronically Signed   By: Rolm Baptise M.D.   On: 02/01/2020 09:49   VAS [place] ABI WITH/WO TBI  Result Date: 02/02/2020 LOWER EXTREMITY DOPPLER STUDY Indications: Ulceration, and bilateral foot ulcers. High Risk Factors: Hypertension, Diabetes.  Comparison Study: no prior Performing  Technologist: Abram Sander RVS  Examination Guidelines: A complete evaluation includes at minimum, Doppler waveform signals and systolic blood pressure reading at the level of bilateral brachial, anterior tibial, and posterior tibial arteries, when vessel segments are accessible. Bilateral testing is considered an integral part of a complete examination. Photoelectric Plethysmograph (PPG) waveforms and toe systolic pressure readings are included as required and additional duplex testing as needed. Limited examinations for reoccurring indications may be performed as noted.  ABI Findings: +---------+------------------+-----+---------+--------+  Right     Rt Pressure (mmHg) Index Waveform  Comment   +---------+------------------+-----+---------+--------+  Brachial  1434                     triphasic           +---------+------------------+-----+---------+--------+  PTA       117                0.82  triphasic           +---------+------------------+-----+---------+--------+  DP        144                1.01  triphasic           +---------+------------------+-----+---------+--------+  Great Toe 68                 0.05                      +---------+------------------+-----+---------+--------+ +---------+------------------+-----+---------+-------+  Left      Lt Pressure (mmHg) Index Waveform  Comment  +---------+------------------+-----+---------+-------+  Brachial  134                      triphasic          +---------+------------------+-----+---------+-------+  PTA       123                0.86  triphasic          +---------+------------------+-----+---------+-------+  DP        127                0.89  triphasic          +---------+------------------+-----+---------+-------+  Great Toe 68                 0.05                     +---------+------------------+-----+---------+-------+ +-------+-----------+-----------+------------+------------+  ABI/TBI Today's ABI Today's TBI Previous ABI Previous TBI   +-------+-----------+-----------+------------+------------+  Right   1.10                                               +-------+-----------+-----------+------------+------------+  Left    0.89                                               +-------+-----------+-----------+------------+------------+  Summary: Right: Resting right ankle-brachial index is within normal range. No evidence of significant right lower extremity arterial disease. The right toe-brachial index is abnormal. Left: Resting left ankle-brachial index indicates mild left lower extremity arterial disease. The left toe-brachial index is abnormal.  *See table(s) above for measurements and observations.  Electronically signed by Monica Martinez MD on 02/02/2020 at 4:35:41 PM.    Final    ECHOCARDIOGRAM COMPLETE  Result Date: 02/02/2020    ECHOCARDIOGRAM REPORT   Patient Name:   [facility] [place] Date of Exam: 02/02/2020 Medical Rec #:  [id number]     Height: Accession #:    [id number]    Weight: Date of Birth:  1961/11/06    BSA: Patient Age:    55 years      BP:           122/63 mmHg Patient Gender: M             HR:           97 bpm. Exam Location:  Inpatient Procedure: 2D Echo Indications:    bacteremia 790.7  History:        Patient has no prior history of Echocardiogram examinations.                 Risk Factors:Diabetes and Hypertension.  Sonographer:    Jannett Celestine RDCS (AE) Referring Phys: [id number] [place]  1. Left ventricular ejection fraction, by estimation, is 65 to 70%. The left ventricle has normal function. The left ventricle has no regional wall motion abnormalities. There is mild left ventricular hypertrophy. Left ventricular diastolic parameters are consistent with Grade II diastolic dysfunction (pseudonormalization).  2. Right ventricular systolic function is normal. The right ventricular size is normal.  3. Left atrial size was moderately dilated.  4. The mitral valve is normal in structure and function. Trivial mitral valve  regurgitation.  5. The aortic valve is tricuspid. Aortic valve regurgitation is not visualized.  6. The inferior vena cava is dilated in size with >50% respiratory variability, suggesting right atrial pressure of 8 mmHg.  7. Technically limited sutdy due to poor acoustic windows. If suspicion is high for endocarditis would consider TEE to further evalaute. FINDINGS  Left Ventricle: Left ventricular ejection fraction, by estimation, is 65 to 70%. The left ventricle has normal function. The left ventricle has no regional wall motion abnormalities. The left ventricular internal cavity size was normal in size. There is  mild left ventricular hypertrophy. Left ventricular diastolic parameters are consistent with Grade II diastolic dysfunction (pseudonormalization). Right Ventricle: The right ventricular size is normal. No increase in right ventricular wall thickness. Right ventricular systolic function is normal. Left Atrium: Left atrial size was moderately dilated. Right Atrium: Right atrial size was normal in size. Pericardium: There is no evidence of pericardial  effusion. Mitral Valve: The mitral valve is normal in structure and function. Trivial mitral valve regurgitation. Tricuspid Valve: The tricuspid valve is normal in structure. Tricuspid valve regurgitation is trivial. Aortic Valve: The aortic valve is tricuspid. Aortic valve regurgitation is not visualized. There is mild calcification of the aortic valve. Pulmonic Valve: The pulmonic valve was grossly normal. Pulmonic valve regurgitation is trivial. Aorta: The aortic root and ascending aorta are structurally normal, with no evidence of dilitation. Venous: The inferior vena cava is dilated in size with greater than 50% respiratory variability, suggesting right atrial pressure of 8 mmHg. IAS/Shunts: No atrial level shunt detected by color flow Doppler.  LEFT VENTRICLE PLAX 2D LVIDd:         4.00 cm Diastology LV PW:         1.50 cm LV e' lateral:   6.85 cm/s LV  IVS:        1.30 cm LV E/e' lateral: 15.5                        LV e' medial:    7.51 cm/s                        LV E/e' medial:  14.1  RIGHT VENTRICLE RV Basal diam:  4.19 cm RV S prime:     150.00 cm/s TAPSE (M-mode): 1.9 cm LEFT ATRIUM LA diam:      4.70 cm LA Vol (A2C): 11.5 ml LA Vol (A4C): 23.7 ml  AORTIC VALVE LVOT Vmax:   104.00 cm/s LVOT VTI:    19.100 m MITRAL VALVE MV PHT:      2.78 msec MV E velocity: 106.00 cm/s MV A velocity: 8900.00 cm/s MV E/A ratio:  0.1 Glori Bickers MD Electronically signed by Glori Bickers MD Signature Date/Time: 02/02/2020/7:00:59 PM    Final    ECHO TEE  Result Date: 02/05/2020    TRANSESOPHOGEAL ECHO REPORT   Patient Name:   [facility] [place] Date of Exam: 02/05/2020 Medical Rec #:  [id number]     Height:       68.0 in Accession #:    [id number]    Weight:       203.5 lb Date of Birth:  Oct 02, 1961    BSA:          2.059 m Patient Age:    89 years      BP:           92/47 mmHg Patient Gender: M             HR:           81 bpm. Exam Location:  Inpatient Procedure: 2D Echo, Color Doppler and Cardiac Doppler Indications:     bacteremia 790.7  History:         Patient has prior history of Echocardiogram examinations, most                  recent 02/02/2020.  Sonographer:     Philipp Deputy Referring Phys:  [id number] [address]olphin WEAVER Diagnosing Phys: Cherlynn Kaiser MD PROCEDURE: After discussion of the risks and benefits of a TEE, an informed consent was obtained from the patient. The transesophogeal probe was passed without difficulty through the esophogus of the patient. Local oropharyngeal anesthetic was provided with Cetacaine. Sedation performed by different physician. The patient was monitored while under deep sedation. Image quality was adequate. The patient's vital signs; including heart rate, blood  pressure, and oxygen saturation; remained stable throughout  the procedure. The patient developed no complications during the procedure. IMPRESSIONS  1. Left ventricular ejection  fraction, by estimation, is 60 to 65%. The left ventricle has normal function. The left ventricle has no regional wall motion abnormalities.  2. Right ventricular systolic function is normal. The right ventricular size is normal.  3. Left atrial size was mildly dilated. No left atrial/left atrial appendage thrombus was detected.  4. 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 Mild mitral valve regurgitation.  5. The aortic valve is normal in structure. Aortic valve regurgitation is not visualized.  6. There is mild (Grade II) atheroma plaque involving the transverse and descending aorta.  7. Evidence of atrial level shunting detected by color flow Doppler. Agitated saline contrast bubble study was positive with shunting observed within 3-6 cardiac cycles suggestive of interatrial shunt. There is a tiny patent foramen ovale with predominantly right to left shunting across the atrial septum. Conclusion(s)/Recommendation(s): No evidence of vegetation/infective endocarditis on this transesophageal echocardiogram. FINDINGS  Left Ventricle: Left ventricular ejection fraction, by estimation, is 60 to 65%. The left ventricle has normal function. The left ventricle has no regional wall motion abnormalities. The left ventricular internal cavity size was normal in size. There is  no left ventricular hypertrophy. Right Ventricle: The right ventricular size is normal. No increase in right ventricular wall thickness. Right ventricular systolic function is normal. Left Atrium: Left atrial size was mildly dilated. No left atrial/left atrial appendage thrombus was detected. Right Atrium: Right atrial size was normal in size. Pericardium: Trivial pericardial effusion is present. 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 Normal mobility of the mitral valve leaflets. Mild mitral valve regurgitation. Tricuspid Valve: The tricuspid valve is normal in structure. Tricuspid valve regurgitation is trivial. No  evidence of tricuspid stenosis. Aortic Valve: The aortic valve is normal in structure. Aortic valve regurgitation is not visualized. Pulmonic Valve: The pulmonic valve was normal in structure. Pulmonic valve regurgitation is trivial. Aorta: The aortic root and ascending aorta are structurally normal, with no evidence of dilitation. There is mild (Grade II) atheroma plaque involving the transverse and descending aorta. IAS/Shunts: The interatrial septum appears to be lipomatous. Evidence of atrial level shunting detected by color flow Doppler. Agitated saline contrast was given intravenously to evaluate for intracardiac shunting. Agitated saline contrast bubble study was positive with shunting observed within 3-6 cardiac cycles suggestive of interatrial shunt. A tiny patent foramen ovale is detected with predominantly right to left shunting across the atrial septum.   AORTA Ao Asc diam: 3.10 cm Cherlynn Kaiser MD Electronically signed by Cherlynn Kaiser MD Signature Date/Time: 02/05/2020/8:13:15 PM    Final    [place] EKG SITE RITE  Result Date: 02/06/2020 If Site Rite image not attached, placement could not be confirmed due to current cardiac rhythm.    Antimicrobials:   Cefazolin 3/5, recommendation 6 weeks total for IV antibiotics   Subjective: No acute events overnight resting in bed comfortably pleasant cooperative  Objective: Vitals:   02/17/20 1633 02/17/20 2111 02/18/20 0538 02/18/20 0907  BP: (!) 152/90 (!) 150/82 (!) 144/84 116/74  Pulse: 90 91 82 96  Resp: 18 17 17 18   Temp: 98.9 F (37.2 C) 98.8 F (37.1 C) 98.7 F (37.1 C) 97.8 F (36.6 C)  TempSrc: Oral Oral  Oral  SpO2: 98% 97% 92% 100%  Weight:      Height:        Intake/Output  Summary (Last 24 hours) at 02/18/2020 1219 Last data filed at 02/18/2020 1100 Gross per 24 hour  Intake 2308.12 ml  Output 2570 ml  Net -261.88 ml   Filed Weights   02/10/20 2038 02/11/20 2127 02/15/20 2113  Weight: 89.8 kg 89.8 kg 77.1 kg     Examination: General exam: Appears calm and comfortable  Respiratory system: Clear to auscultation. Respiratory effort normal. Cardiovascular system: S1 & S2 heard, RRR. No JVD, murmurs, rubs, gallops or clicks. No pedal edema. Gastrointestinal system: Abdomen is nondistended, soft and nontender. No organomegaly or masses felt. Normal bowel sounds heard. Central nervous system: Alert and oriented. No new focal neurological deficits. Extremities: Mild edema. Skin: No new lesions Psychiatry: Judgement and insight appear normal. Mood & affect appropriate..     Data Reviewed: I have personally reviewed following labs and imaging studies  CBC: Recent Labs  Lab 02/14/20 0201 02/15/20 0325 02/16/20 0402 02/17/20 0427 02/18/20 0319  WBC 8.9 8.2 8.2 7.3 6.7  NEUTROABS 5.8 4.6 5.0 4.4 3.7  HGB 9.5* 8.8* 8.7* 8.6* 8.6*  HCT 31.0* 28.3* 28.7* 28.7* 28.2*  MCV 72.8* 73.3* 73.2* 73.4* 73.4*  PLT 454* 410* 387 366 740   Basic Metabolic Panel: Recent Labs  Lab 02/13/20 0335 02/15/20 0325 02/18/20 0319  NA 132* 134* 137  K 4.5 4.3 4.2  CL 98 103 107  CO2 24 22 23   GLUCOSE 215* 195* 151*  BUN 31* 31* 21*  CREATININE 1.27* 1.20 0.90  CALCIUM 8.5* 8.5* 8.6*   GFR: Estimated Creatinine Clearance: 87.6 mL/min (by C-G formula based on SCr of 0.9 mg/dL). Liver Function Tests: Recent Labs  Lab 02/13/20 0335 02/15/20 0325  AST 31 23  ALT 24 17  ALKPHOS 160* 154*  BILITOT 0.3 0.2*  PROT 6.8 6.3*  ALBUMIN 1.8* 1.9*   No results for input(s): LIPASE, AMYLASE in the last 168 hours. No results for input(s): AMMONIA in the last 168 hours. Coagulation Profile: No results for input(s): INR, PROTIME in the last 168 hours. Cardiac Enzymes: No results for input(s): CKTOTAL, CKMB, CKMBINDEX, TROPONINI in the last 168 hours. BNP (last 3 results) No results for input(s): PROBNP in the last 8760 hours. HbA1C: No results for input(s): HGBA1C in the last 72 hours. CBG: Recent Labs   Lab 02/17/20 1119 02/17/20 1632 02/17/20 2113 02/18/20 0637 02/18/20 1121  GLUCAP 151* 228* 101* 156* 165*   Lipid Profile: No results for input(s): CHOL, HDL, LDLCALC, TRIG, CHOLHDL, LDLDIRECT in the last 72 hours. Thyroid Function Tests: No results for input(s): TSH, T4TOTAL, FREET4, T3FREE, THYROIDAB in the last 72 hours. Anemia Panel: No results for input(s): VITAMINB12, FOLATE, FERRITIN, TIBC, IRON, RETICCTPCT in the last 72 hours. Sepsis Labs: No results for input(s): PROCALCITON, LATICACIDVEN in the last 168 hours.  Recent Results (from the past 240 hour(s))  Culture, blood (routine x 2)     Status: None   Collection Time: 02/09/20  1:38 PM   Specimen: BLOOD  Result Value Ref Range Status   Specimen Description BLOOD LEFT ANTECUBITAL  Final   Special Requests AEROBIC BOTTLE ONLY Blood Culture adequate volume  Final   Culture   Final    NO GROWTH 5 DAYS Performed at [facility], [address], [place], [place] [address]    Report Status 02/14/2020 FINAL  Final  Culture, blood (routine x 2)     Status: None   Collection Time: 02/09/20  1:39 PM   Specimen: BLOOD LEFT HAND  Result Value  Ref Range Status   Specimen Description BLOOD LEFT HAND  Final   Special Requests   Final    BOTTLES DRAWN AEROBIC AND ANAEROBIC Blood Culture adequate volume   Culture   Final    NO GROWTH 5 DAYS Performed at [facility], [address], [place], [place] [address]    Report Status 02/14/2020 FINAL  Final         Radiology Studies: No results found.      Scheduled Meds:  Chlorhexidine Gluconate Cloth  6 each Topical Daily   feeding supplement (GLUCERNA SHAKE)  237 mL Oral TID BM   feeding supplement (PRO-STAT SUGAR FREE 64)  30 mL Oral BID   insulin aspart  0-15 Units Subcutaneous TID WC   insulin aspart  0-5 Units Subcutaneous QHS   insulin aspart protamine- aspart  8 Units Subcutaneous BID WC   insulin starter kit- pen needles  1 kit Other Once    multivitamin with minerals  1 tablet Oral Daily   tamsulosin  0.4 mg Oral Daily   vitamin B-12  1,000 mcg Oral Daily   Continuous Infusions:  sodium chloride Stopped (02/18/20 0539)    ceFAZolin (ANCEF) IV Stopped (02/18/20 9198)   lactated ringers Stopped (02/03/20 1146)   methocarbamol (ROBAXIN) IV       LOS: 17 days    Time spent: [place], MD [facility]  If 7PM-7AM, please contact night-coverage  02/18/2020, 12:19 PM

## 2020-02-19 LAB — BASIC METABOLIC PANEL
Anion gap: 10 (ref 5–15)
BUN: 20 mg/dL (ref 6–20)
CO2: 22 mmol/L (ref 22–32)
Calcium: 8.6 mg/dL — ABNORMAL LOW (ref 8.9–10.3)
Chloride: 104 mmol/L (ref 98–111)
Creatinine, Ser: 1.02 mg/dL (ref 0.61–1.24)
GFR calc Af Amer: >60 mL/min (ref >60)
GFR calc non Af Amer: >60 mL/min (ref >60)
Glucose, Bld: 169 mg/dL — ABNORMAL HIGH (ref 70–99)
Potassium: 3.9 mmol/L (ref 3.5–5.1)
Sodium: 136 mmol/L (ref 135–145)

## 2020-02-19 LAB — CBC WITH DIFFERENTIAL/PLATELET
Abs Immature Granulocytes: 0.01 10*3/uL (ref 0.00–0.07)
Basophils Absolute: 0.1 10*3/uL (ref 0.0–0.1)
Basophils Relative: 1 %
Eosinophils Absolute: 0.1 10*3/uL (ref 0.0–0.5)
Eosinophils Relative: 2 %
HCT: 28.5 % — ABNORMAL LOW (ref 39.0–52.0)
Hemoglobin: 8.7 g/dL — ABNORMAL LOW (ref 13.0–17.0)
Immature Granulocytes: 0 %
Lymphocytes Relative: 33 %
Lymphs Abs: 2.1 10*3/uL (ref 0.7–4.0)
MCH: 22.7 pg — ABNORMAL LOW (ref 26.0–34.0)
MCHC: 30.5 g/dL (ref 30.0–36.0)
MCV: 74.4 fL — ABNORMAL LOW (ref 80.0–100.0)
Monocytes Absolute: 0.6 10*3/uL (ref 0.1–1.0)
Monocytes Relative: 9 %
Neutro Abs: 3.5 10*3/uL (ref 1.7–7.7)
Neutrophils Relative %: 55 %
Platelets: 364 10*3/uL (ref 150–400)
RBC: 3.83 MIL/uL — ABNORMAL LOW (ref 4.22–5.81)
RDW: 16 % — ABNORMAL HIGH (ref 11.5–15.5)
WBC: 6.4 10*3/uL (ref 4.0–10.5)
nRBC: 0 % (ref 0.0–0.2)

## 2020-02-19 LAB — GLUCOSE, CAPILLARY
Glucose-Capillary: 168 mg/dL — ABNORMAL HIGH (ref 70–99)
Glucose-Capillary: 173 mg/dL — ABNORMAL HIGH (ref 70–99)
Glucose-Capillary: 160 mg/dL — ABNORMAL HIGH (ref 70–99)
Glucose-Capillary: 126 mg/dL — ABNORMAL HIGH (ref 70–99)

## 2020-02-19 NOTE — Progress Notes (Signed)
PROGRESS NOTE    Chris Bowman  WUG:[id number] DOB: 06-22-1961 DOA: 02/01/2020 PCP: Patient, No Pcp Per   Brief Narrative: 59 year old male with Unknown medical history, who has not seen a doctor [id number] presented to the ER on 3/4 with weakness and falls x2 weeks and associated fever dyspnea cough, chronic buttock pain since the fall along with limitation of range of motion, right arm pain. 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 Patient is on Ancef as per ID and planned for 6 weeks of IV antibiotic.  PT/OT recommended skilled nursing facility.   Awaiting Medicaid application, sister working with a Development worker, community, awaiting placement.  Had prolonged hospital course. .  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  Seen by ID, orthopedics, neurosurgery.  TEE negative for endocarditis.  Status post I&D of ulcer on the left great toe.   Neurosurgery recommended 6 weeks to 3 months of antibiotic, repeat MRI C-spine with and without contrast in 6 weeks.  Repeat blood cultures have been negative.  ID was following.  Plan for continue Ancef for 6 weeks course.  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  Case discussed with ENT, recommended antibiotic management, no surgical intervention.  AKI: Likely associated to ATN in the setting of sepsis.  No obstruction ultrasound.  Currently kidney function is stable.  Diabetes mellitus with hyperglycemia: A1c uncontrolled at 11.3.  Not on insulin at home.  Currently on 70/30 insulin.  Diabetic coordinator was following.  Left foot ulcer: Continue wound care  Generalized weakness/frequent falls: PT/OT recommended skilled nursing facility.  Elevated liver enzymes: Likely associated with sepsis, much improved.  Ultrasound showed cholelithiasis.  Microcytic anemia: Currently hemoglobin stable.  Delusional disorder/impaired memory: MRI showed mild cerebral atrophy, chronic small vessel ischemia.  Currently alert, awake and oriented, follows commands.     Nutrition Problem: Increased nutrient needs Etiology: wound healing      DVT prophylaxis:SCD Code Status: Full Family Communication: None present at the bedside Disposition Plan: Patient is from home.  Physical therapy/Occupational Therapy recommended skilled nursing facility.  Patient is medically stable for discharge.  Awaiting Medicaid application, sister working with a Development worker, community, awaiting placement.  Consultants: ID, orthopedics, neurosurgery  Procedures: I&D, PICC line placement,TMA right  Antimicrobials:  Anti-infectives (From admission, onward)   Start     Dose/Rate Route Frequency Ordered Stop   02/03/20 1130  ceFAZolin (ANCEF) IVPB 1 g/50 mL premix  Status:  Discontinued     1 g  100 mL/hr over 30 Minutes  Intravenous Every 6 hours 02/03/20 1124 02/03/20 1139   02/02/20 1100  vancomycin (VANCOREADY) IVPB 750 mg/150 mL  Status:  Discontinued     750 mg 150 mL/hr over 60 Minutes Intravenous Every 24 hours 02/01/20 1218 02/02/20 0221   02/02/20 0600  ceFAZolin (ANCEF) IVPB 2g/100 mL premix     2 g 200 mL/hr over 30 Minutes Intravenous Every 8 hours 02/02/20 0221     02/01/20 2230  ceFEPIme (MAXIPIME) 2 g in sodium chloride 0.9 % 100 mL IVPB  Status:  Discontinued     2 g 200 mL/hr over 30 Minutes Intravenous Every 12 hours 02/01/20 1218 02/02/20 0221   02/01/20 1200  metroNIDAZOLE (FLAGYL) IVPB 500 mg  Status:  Discontinued     500 mg 100 mL/hr over 60 Minutes Intravenous Every 8 hours 02/01/20 1149 02/02/20 0221   02/01/20 1015  ceFEPIme (MAXIPIME) 2 g in sodium chloride 0.9 % 100 mL IVPB     2 g 200 mL/hr over 30 Minutes Intravenous  Once 02/01/20 1008 02/01/20 1056   02/01/20 1015  vancomycin (VANCOREADY) IVPB 1750 mg/350 mL     1,750 mg 175 mL/hr over 120 Minutes Intravenous  Once 02/01/20 1008 02/01/20 1336      Subjective: Patient seen and examined at the bedside this afternoon.  Hemodynamically stable.  Complains of mild pain on the back of his right neck.  Waiting for placement.  Objective: Vitals:   02/18/20 1645 02/18/20 2051 02/19/20 0539 02/19/20 0903  BP: 118/68 (!) 146/80 (!) 163/84 (!) 149/80  Pulse: 90 88 87 89  Resp: 18 20 20 18   Temp: 98 F (36.7 C) 98.5 F (36.9 C) 98.2 F (36.8 C) 98 F (36.7 C)  TempSrc: Oral Oral Oral Oral  SpO2: 99% 96% 99% 96%  Weight:      Height:        Intake/Output Summary (Last 24 hours) at 02/19/2020 1330 Last data filed at 02/19/2020 0600 Gross per 24 hour  Intake 828.08 ml  Output 1275 ml  Net -446.92 ml   Filed Weights   02/10/20 2038 02/11/20 2127 02/15/20 2113  Weight: 89.8 kg 89.8 kg 77.1 kg    Examination:  General exam: Appears calm and comfortable ,Not in distress,average built HEENT:PERRL,Oral mucosa moist,  Ear/Nose normal on gross exam. Respiratory system: Bilateral equal air entry, normal vesicular breath sounds, no wheezes or crackles  Cardiovascular system: S1 & S2 heard, RRR. No JVD, murmurs, rubs, gallops or clicks. . Gastrointestinal system: Abdomen is nondistended, soft and nontender. No organomegaly or masses felt. Normal bowel sounds heard. Central nervous system: Alert and oriented. No focal neurological deficits. Extremities: No edema, no clubbing ,no cyanosis, PICC line on the right arm  skin: No rashes, lesions or ulcers,no icterus ,no pallor    Data Reviewed: I have personally reviewed following labs and imaging studies  CBC: Recent Labs  Lab 02/15/20 0325 02/16/20 0402 02/17/20 0427 02/18/20 0319 02/19/20 0407  WBC 8.2 8.2 7.3 6.7 6.4  NEUTROABS 4.6 5.0 4.4 3.7 3.5  HGB 8.8* 8.7* 8.6* 8.6* 8.7*  HCT 28.3* 28.7* 28.7* 28.2* 28.5*  MCV 73.3* 73.2* 73.4* 73.4* 74.4*  PLT 410* 387 366 370 161   Basic Metabolic Panel: Recent Labs  Lab 02/13/20 0335 02/15/20 0325 02/18/20 0319 02/19/20 0407  NA 132* 134* 137 136  K 4.5 4.3 4.2 3.9  CL 98 103 107 104  CO2 24 22 23 22   GLUCOSE 215* 195* 151*  169*  BUN 31* 31* 21* 20  CREATININE 1.27* 1.20 0.90 1.02  CALCIUM 8.5* 8.5* 8.6* 8.6*   GFR: Estimated Creatinine Clearance: 77.3 mL/min (by C-G formula based on SCr of 1.02 mg/dL). Liver Function Tests: Recent Labs  Lab 02/13/20 0335 02/15/20 0325  AST 31 23  ALT 24 17  ALKPHOS 160* 154*  BILITOT 0.3 0.2*  PROT 6.8 6.3*  ALBUMIN 1.8* 1.9*   No results for input(s): LIPASE, AMYLASE in the last 168 hours. No results for input(s): AMMONIA in the last 168 hours. Coagulation Profile: No results for input(s): INR, PROTIME in the last 168 hours. Cardiac Enzymes: No results for input(s): CKTOTAL, CKMB, CKMBINDEX, TROPONINI in the last 168 hours. BNP (last 3 results) No results for input(s): PROBNP in the last 8760 hours. HbA1C: No results for input(s): HGBA1C in  the last 72 hours. CBG: Recent Labs  Lab 02/18/20 1121 02/18/20 1622 02/18/20 2202 02/19/20 0659 02/19/20 1107  GLUCAP 165* 167* 164* 168* 173*   Lipid Profile: No results for input(s): CHOL, HDL, LDLCALC, TRIG, CHOLHDL, LDLDIRECT in the last 72 hours. Thyroid Function Tests: No results for input(s): TSH, T4TOTAL, FREET4, T3FREE, THYROIDAB in the last 72 hours. Anemia Panel: No results for input(s): VITAMINB12, FOLATE, FERRITIN, TIBC, IRON, RETICCTPCT in the last 72 hours. Sepsis Labs: No results for input(s): PROCALCITON, LATICACIDVEN in the last 168 hours.  Recent Results (from the past 240 hour(s))  Culture, blood (routine x 2)     Status: None   Collection Time: 02/09/20  1:38 PM   Specimen: BLOOD  Result Value Ref Range Status   Specimen Description BLOOD LEFT ANTECUBITAL  Final   Special Requests AEROBIC BOTTLE ONLY Blood Culture adequate volume  Final   Culture   Final    NO GROWTH 5 DAYS Performed at [facility], [address] [place], [place] [address]    Report Status 02/14/2020 FINAL  Final  Culture, blood (routine x 2)     Status: None   Collection Time: 02/09/20  1:39 PM   Specimen: BLOOD LEFT HAND  Result Value Ref Range Status   Specimen Description BLOOD LEFT HAND  Final   Special Requests   Final    BOTTLES DRAWN AEROBIC AND ANAEROBIC Blood Culture adequate volume   Culture   Final    NO GROWTH 5 DAYS Performed at [facility], [place] [address], [place], [place] [address]    Report Status 02/14/2020 FINAL  Final         Radiology Studies: No results found.      Scheduled Meds: . Chlorhexidine Gluconate Cloth  6 each Topical Daily  . feeding supplement (GLUCERNA SHAKE)  237 mL Oral TID BM  . feeding supplement (PRO-STAT SUGAR FREE 64)  30 mL Oral BID  . insulin aspart  0-15 Units Subcutaneous TID WC  . insulin aspart  0-5 Units Subcutaneous QHS  . insulin aspart protamine- aspart  8 Units Subcutaneous BID WC  . insulin  starter kit- pen needles  1 kit Other Once  . multivitamin with minerals  1 tablet Oral Daily  . tamsulosin  0.4 mg Oral Daily  . vitamin B-12  1,000 mcg Oral Daily   Continuous Infusions: . sodium chloride 50 mL/hr at 02/19/20 0600  .  ceFAZolin (ANCEF) IV 2 g (02/19/20 1307)  . lactated ringers Stopped (02/03/20 1146)  . methocarbamol (ROBAXIN) IV       LOS: 18 days    Time spent: More  than 50% of that time was spent in counseling and/or coordination of care.      Shelly Coss, MD [facility] P3/22/2021, 1:30 PM

## 2020-02-19 NOTE — Plan of Care (Signed)
  Problem: Activity: Goal: Risk for activity intolerance will decrease Outcome: Progressing   

## 2020-02-20 LAB — GLUCOSE, CAPILLARY
Glucose-Capillary: 164 mg/dL — ABNORMAL HIGH (ref 70–99)
Glucose-Capillary: 146 mg/dL — ABNORMAL HIGH (ref 70–99)
Glucose-Capillary: 103 mg/dL — ABNORMAL HIGH (ref 70–99)
Glucose-Capillary: 156 mg/dL — ABNORMAL HIGH (ref 70–99)

## 2020-02-20 NOTE — Plan of Care (Signed)
  Problem: Activity: Goal: Risk for activity intolerance will decrease Outcome: Progressing   

## 2020-02-20 NOTE — Progress Notes (Signed)
Physical Therapy Treatment Patient Details Name: Chris Bowman MRN: [id number] DOB: [birth year] Today's Date: 02/20/2020    History of Present Illness 59 y.o. male was admitted with bacteremia and received TEE to rule out endocarditis causing sepsis.  His R foot received transmet amp on 02/03/20 and L great toe wound I and D. PMHx:  uncontrolled diabetes, essential HTN, delusional disorders. bilateral feet wounds with early indication of osteomyelitis per MRI results from 3/4    PT Comments    Pt demonstrating good progress.  Needs min A with balance in standing at times and cues for NWB on R.  Required cues for exercise technique and min A for balance with standing activities at times.  Pt remains fall risk and continue to recommend SNF.    Follow Up Recommendations  SNF     Equipment Recommendations  Wheelchair cushion (measurements PT);Wheelchair (measurements PT);3in1 (PT)    Recommendations for Other Services       Precautions / Restrictions Precautions Precautions: Fall;Other (comment) Precaution Comments: R foot ray amputation - NWB Other Brace: R post-op shoe Restrictions Weight Bearing Restrictions: Yes RLE Weight Bearing: Non weight bearing LLE Weight Bearing: Weight bearing as tolerated Other Position/Activity Restrictions: ortho shoe R foot    Mobility  Bed Mobility Overal bed mobility: Needs Assistance Bed Mobility: Supine to Sit;Sit to Supine     Supine to sit: Min guard Sit to supine: Supervision   General bed mobility comments: HOB elevated, use of bedrail  Transfers Overall transfer level: Needs assistance Equipment used: Rolling walker (2 wheeled) Transfers: Sit to/from Stand Sit to Stand: Min guard;From elevated surface         General transfer comment: min guard for balance; cues for R LE NWB; Performed x 5  Ambulation/Gait Ambulation/Gait assistance: Min assist Gait Distance (Feet): 3 Feet Assistive device: Rolling walker (2 wheeled) Gait  Pattern/deviations: Step-to pattern;Decreased stride length Gait velocity: reduced   General Gait Details: hopped on L foot at EOB with min a for balance and cues for RW; fatigued easily   Stairs             Wheelchair Mobility    Modified Rankin (Stroke Patients Only)       Balance Overall balance assessment: Needs assistance Sitting-balance support: No upper extremity supported;Feet supported Sitting balance-Leahy Scale: Good     Standing balance support: Bilateral upper extremity supported Standing balance-Leahy Scale: Poor Standing balance comment: reliance on B UE support on RW, able to demo hopping forward/back at bedside                             Cognition Arousal/Alertness: Awake/alert Behavior During Therapy: [facility] for tasks assessed/performed;Anxious Overall Cognitive Status: Within Functional Limits for tasks assessed                                 General Comments: Pt pleasant and willing to participate in therapy; pt does well with clear plan      Exercises General Exercises - Lower Extremity Ankle Circles/Pumps: AROM;Both;10 reps Hip ABduction/ADduction: AROM;10 reps;Right;Standing Straight Leg Raises: AROM;Right;Standing;10 reps Hip Flexion/Marching: AROM;Right;Standing;10 reps Heel Raises: AROM;Left;10 reps;Standing Mini-Sqauts: AROM;Left;10 reps;Standing    General Comments        Pertinent Vitals/Pain Pain Assessment: No/denies pain    Home Living  Prior Function            PT Goals (current goals can now be found in the care plan section) Progress towards PT goals: Progressing toward goals    Frequency    Min 2X/week      PT Plan Current plan remains appropriate    Co-evaluation              AM-PAC PT "6 Clicks" Mobility   Outcome Measure  Help needed turning from your back to your side while in a flat bed without using bedrails?: None Help needed moving  from lying on your back to sitting on the side of a flat bed without using bedrails?: None Help needed moving to and from a bed to a chair (including a wheelchair)?: A Little Help needed standing up from a chair using your arms (e.g., wheelchair or bedside chair)?: A Little Help needed to walk in hospital room?: A Lot Help needed climbing 3-5 steps with a railing? : Total 6 Click Score: 17    End of Session Equipment Utilized During Treatment: Gait belt Activity Tolerance: Patient tolerated treatment well Patient left: with call bell/phone within reach;in bed;with bed alarm set Nurse Communication: Mobility status PT Visit Diagnosis: Unsteadiness on feet (R26.81);Muscle weakness (generalized) (M62.81);Difficulty in walking, not elsewhere classified (R26.2);Adult, failure to thrive (R62.7);Repeated falls (R29.6);Pain Pain - Right/Left: Right Pain - part of body: Ankle and joints of foot     Time: 1240-1304 PT Time Calculation (min) (ACUTE ONLY): 24 min  Charges:  $Therapeutic Exercise: 8-22 mins $Therapeutic Activity: 8-22 mins                     Maggie Font, PT Acute Rehab Services Pager [phone] [facility] [phone] [facility] [phone]    [name] 02/20/2020, 2:35 PM

## 2020-02-20 NOTE — Progress Notes (Signed)
PROGRESS NOTE    Chris Bowman  JFH:[id number] DOB: 1961-05-26 DOA: 02/01/2020 PCP: Patient, No Pcp Per   Brief Narrative: 59 year old male with Unknown medical history, who has not seen a doctor [id number] presented to the ER on 3/4 with weakness and falls x2 weeks and associated fever dyspnea cough, chronic buttock pain since the fall along with limitation of range of motion, right arm pain. 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 Patient is on Ancef as per ID and planned for 6 weeks of IV antibiotic.  PT/OT recommended skilled nursing facility.   Awaiting Medicaid application, sister working with a Development worker, community, awaiting placement.  Had prolonged hospital course. .  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  Seen by ID, orthopedics, neurosurgery.  TEE negative for endocarditis.  Status post I&D of ulcer on the left great toe.   Neurosurgery recommended 6 weeks to 3 months of antibiotic, repeat MRI C-spine with and without contrast in 6 weeks.  Repeat blood cultures have been negative.  ID was following.  Plan for continue Ancef for 6 weeks course.  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  Case discussed with ENT, recommended antibiotic management, no surgical intervention.  AKI: Likely associated to ATN in the setting of sepsis.  No obstruction ultrasound.  Currently kidney function is stable.  Diabetes mellitus with hyperglycemia: A1c uncontrolled at 11.3.  Not on insulin at home.  Currently on 70/30 insulin.  Diabetic coordinator was following.  Left foot ulcer: Continue wound care.  Status post right TMA  Generalized weakness/frequent falls: PT/OT recommended skilled nursing facility.  Elevated liver enzymes: Likely associated with sepsis, much improved.  Ultrasound showed cholelithiasis.  Microcytic anemia: Currently hemoglobin stable.  Delusional disorder/impaired memory: MRI showed mild cerebral atrophy, chronic small vessel ischemia.  Currently alert, awake and oriented, follows commands.     Nutrition Problem: Increased nutrient needs Etiology: wound healing      DVT prophylaxis:SCD Code Status: Full Family Communication: None present at the bedside.  Discussed the plan with the patient, he understands very well Disposition Plan: Patient is from home.  Physical therapy/Occupational Therapy recommended skilled nursing facility.  Patient is medically stable for discharge.  Awaiting Medicaid application, sister working with a Development worker, community, awaiting placement.  Consultants: ID, orthopedics, neurosurgery  Procedures: I&D, PICC line placement,TMA right  Antimicrobials:  Anti-infectives (From admission, onward)   Start     Dose/Rate Route Frequency Ordered Stop   02/03/20 1130  ceFAZolin (  ANCEF)  IVPB 1 g/50 mL premix  Status:  Discontinued     1 g 100 mL/hr over 30 Minutes Intravenous Every 6 hours 02/03/20 1124 02/03/20 1139   02/02/20 1100  vancomycin (VANCOREADY) IVPB 750 mg/150 mL  Status:  Discontinued     750 mg 150 mL/hr over 60 Minutes Intravenous Every 24 hours 02/01/20 1218 02/02/20 0221   02/02/20 0600  ceFAZolin (ANCEF) IVPB 2g/100 mL premix     2 g 200 mL/hr over 30 Minutes Intravenous Every 8 hours 02/02/20 0221     02/01/20 2230  ceFEPIme (MAXIPIME) 2 g in sodium chloride 0.9 % 100 mL IVPB  Status:  Discontinued     2 g 200 mL/hr over 30 Minutes Intravenous Every 12 hours 02/01/20 1218 02/02/20 0221   02/01/20 1200  metroNIDAZOLE (FLAGYL) IVPB 500 mg  Status:  Discontinued     500 mg 100 mL/hr over 60 Minutes Intravenous Every 8 hours 02/01/20 1149 02/02/20 0221   02/01/20 1015  ceFEPIme (MAXIPIME) 2 g in sodium chloride 0.9 % 100 mL IVPB     2 g 200 mL/hr over 30 Minutes Intravenous  Once 02/01/20 1008 02/01/20 1056   02/01/20 1015  vancomycin (VANCOREADY) IVPB 1750 mg/350 mL     1,750 mg 175 mL/hr over 120 Minutes Intravenous  Once 02/01/20 1008 02/01/20 1336      Subjective: Patient seen and examined the bedside this morning.  Hemodynamically stable.  Sitting on the chair.  About to work with occupational therapy.  Complains of mild pain on his neck but remains comfortable  Objective: Vitals:   02/19/20 0539 02/19/20 0903 02/19/20 2109 02/20/20 0436  BP: (!) 163/84 (!) 149/80 (!) 143/84 139/86  Pulse: 87 89 87 90  Resp: 20 18 16 18   Temp: 98.2 F (36.8 C) 98 F (36.7 C) 98.3 F (36.8 C) (!) 97.3 F (36.3 C)  TempSrc: Oral Oral Oral Oral  SpO2: 99% 96% 98% 100%  Weight:   73 kg   Height:        Intake/Output Summary (Last 24 hours) at 02/20/2020 0840 Last data filed at 02/20/2020 0601 Gross per 24 hour  Intake 2179.18 ml  Output 3025 ml  Net -845.82 ml   Filed Weights   02/11/20 2127 02/15/20 2113 02/19/20 2109  Weight: 89.8 kg 77.1 kg 73  kg    Examination:   General exam: Comfortable Respiratory system:  no wheezes or crackles  Cardiovascular system: S1 & S2 heard, RRR.  Gastrointestinal system: Abdomen is nondistended, soft and nontender. No organomegaly or masses felt. Normal bowel sounds heard. Central nervous system: Alert and oriented. No focal neurological deficits. Extremities: No edema, no clubbing ,no cyanosis, right TMA .  PICC line on the right arm skin: No rashes, lesions or ulcers,no icterus ,no pallor   Data Reviewed: I have personally reviewed following labs and imaging studies  CBC: Recent Labs  Lab 02/15/20 0325 02/16/20 0402 02/17/20 0427 02/18/20 0319 02/19/20 0407  WBC 8.2 8.2 7.3 6.7 6.4  NEUTROABS 4.6 5.0 4.4 3.7 3.5  HGB 8.8* 8.7* 8.6* 8.6* 8.7*  HCT 28.3* 28.7* 28.7* 28.2* 28.5*  MCV 73.3* 73.2* 73.4* 73.4* 74.4*  PLT 410* 387 366 370 458   Basic Metabolic Panel: Recent Labs  Lab 02/15/20 0325 02/18/20 0319 02/19/20 0407  NA 134* 137 136  K 4.3 4.2 3.9  CL 103 107 104  CO2 22 23 22   GLUCOSE 195* 151* 169*  BUN 31* 21* 20  CREATININE 1.20  0.90 1.02  CALCIUM 8.5* 8.6* 8.6*   GFR: Estimated Creatinine Clearance: 77.3 mL/min (by C-G formula based on SCr of 1.02 mg/dL). Liver Function Tests: Recent Labs  Lab 02/15/20 0325  AST 23  ALT 17  ALKPHOS 154*  BILITOT 0.2*  PROT 6.3*  ALBUMIN 1.9*   No results for input(s): LIPASE, AMYLASE in the last 168 hours. No results for input(s): AMMONIA in the last 168 hours. Coagulation Profile: No results for input(s): INR, PROTIME in the last 168 hours. Cardiac Enzymes: No results for input(s): CKTOTAL, CKMB, CKMBINDEX, TROPONINI in the last 168 hours. BNP (last 3 results) No results for input(s): PROBNP in the last 8760 hours. HbA1C: No results for input(s): HGBA1C in the last 72 hours. CBG: Recent Labs  Lab 02/19/20 0659 02/19/20 1107 02/19/20 1607 02/19/20 2110 02/20/20 0640  GLUCAP 168* 173* 160* 126* 164*    Lipid Profile: No results for input(s): CHOL, HDL, LDLCALC, TRIG, CHOLHDL, LDLDIRECT in the last 72 hours. Thyroid Function Tests: No results for input(s): TSH, T4TOTAL, FREET4, T3FREE, THYROIDAB in the last 72 hours. Anemia Panel: No results for input(s): VITAMINB12, FOLATE, FERRITIN, TIBC, IRON, RETICCTPCT in the last 72 hours. Sepsis Labs: No results for input(s): PROCALCITON, LATICACIDVEN in the last 168 hours.  No results found for this or any previous visit (from the past 240 hour(s)).       Radiology Studies: No results found.      Scheduled Meds: . Chlorhexidine Gluconate Cloth  6 each Topical Daily  . feeding supplement (GLUCERNA SHAKE)  237 mL Oral TID BM  . feeding supplement (PRO-STAT SUGAR FREE 64)  30 mL Oral BID  . insulin aspart  0-15 Units Subcutaneous TID WC  . insulin aspart  0-5 Units Subcutaneous QHS  . insulin aspart protamine- aspart  8 Units Subcutaneous BID WC  . insulin starter kit- pen needles  1 kit Other Once  . multivitamin with minerals  1 tablet Oral Daily  . tamsulosin  0.4 mg Oral Daily  . vitamin B-12  1,000 mcg Oral Daily   Continuous Infusions: . sodium chloride 50 mL/hr at 02/19/20 0600  .  ceFAZolin (ANCEF) IV 2 g (02/20/20 0612)  . lactated ringers Stopped (02/03/20 1146)  . methocarbamol (ROBAXIN) IV       LOS: 19 days    Time spent: 15 mins.More than 50% of that time was spent in counseling and/or coordination of care.      Shelly Coss, MD [facility] P3/23/2021, 8:40 AM

## 2020-02-20 NOTE — Progress Notes (Signed)
Occupational Therapy Treatment Patient Details Name: Chris Bowman MRN: [id number] DOB: 07-Feb-1961 Today's Date: 02/20/2020    History of present illness 59 y.o. male was admitted with bacteremia and received TEE to rule out endocarditis causing sepsis.  His R foot received transmet amp on 02/03/20 and L great toe wound I and D. PMHx:  uncontrolled diabetes, essential HTN, delusional disorders. bilateral feet wounds with early indication of osteomyelitis per MRI results from 3/4   OT comments  Pt making progress in therapy demonstrating improved independence with self-care and transfer tasks. Pt utilized figure four pattern to don RLE post op shoe requiring assist to manage velcro straps. Educated pt on safety strategies, fall prevention, and NWB status with fair understanding and poor to fair follow through. Pt able to stand at bedside to use urinal requiring min assist for balance and max cues to maintain NWB. Pt able to hop 3 feet over to bedside chair with RW and min assist for balance. Pt completed TB bathing task requiring min assist for peri care and balance in standing. Increased difficulty maintaining RLE NWB status in standing while attempting to let go of RW with one hand to complete peri care. OT will continue to follow acutely. Continue to recommend SNF placement for additional rehab prior to discharge home.    Follow Up Recommendations  SNF;Supervision/Assistance - 24 hour    Equipment Recommendations  3 in 1 bedside commode    Recommendations for Other Services      Precautions / Restrictions Precautions Precautions: Fall;Other (comment) Precaution Comments: picc line, R foot ray amputation - NWB Required Braces or Orthoses: Other Brace Other Brace: R post-op shoe Restrictions Weight Bearing Restrictions: Yes RLE Weight Bearing: Non weight bearing LLE Weight Bearing: Weight bearing as tolerated Other Position/Activity Restrictions: ortho shoe R foot       Mobility Bed  Mobility Overal bed mobility: Needs Assistance Bed Mobility: Supine to Sit     Supine to sit: Min guard     General bed mobility comments: HOB elevated, use of bedrail  Transfers Overall transfer level: Needs assistance Equipment used: Rolling walker (2 wheeled) Transfers: Sit to/from [facility] Sit to Stand: Min assist Stand pivot transfers: Min assist       General transfer comment: Assist for balance. Cues to maintain RLE NWB    Balance Overall balance assessment: Needs assistance Sitting-balance support: No upper extremity supported;Feet supported Sitting balance-Leahy Scale: Good       Standing balance-Leahy Scale: Poor                             ADL either performed or assessed with clinical judgement   ADL Overall ADL's : Needs assistance/impaired         Upper Body Bathing: Set up;Supervision/ safety;Sitting   Lower Body Bathing: Minimal assistance;Sit to/from stand Lower Body Bathing Details (indicate cue type and reason): Assist for peri care and balance. Pt unable to stand and maintain NWB on RLE while letting go of RW with one hand.     Lower Body Dressing: Minimal assistance;Sitting/lateral leans Lower Body Dressing Details (indicate cue type and reason): to don RLE post op shoe     Toileting- Clothing Manipulation and Hygiene: Minimal assistance Toileting - Clothing Manipulation Details (indicate cue type and reason): Assist for balance while pt stood and used urinal at bedside. Mod to max cues to maintain NWB RLE in standing. Increased difficulty maintaining NWB status while letting  go with one hand to use urinal.      Functional mobility during ADLs: Minimal assistance;Rolling walker General ADL Comments: Pt able to stand and hop 3 feet over to bedside chair with RW and min assist for balance. Noted 0 instances of LOB, however pt unsteady on feet. Pt required mod cues to adhere to NWB.      Vision        Perception     Praxis      Cognition Arousal/Alertness: Awake/alert Behavior During Therapy: WFL for tasks assessed/performed;Anxious Overall Cognitive Status: No family/caregiver present to determine baseline cognitive functioning                                 General Comments: Pt pleasant and willing to participate in therapy        Exercises     Shoulder Instructions       General Comments Pt setup in bedside chair. No signs/symptoms of distress    Pertinent Vitals/ Pain       Pain Assessment: No/denies pain  Home Living                                          Prior Functioning/Environment              Frequency           Progress Toward Goals  OT Goals(current goals can now be found in the care plan section)  Progress towards OT goals: Progressing toward goals  ADL Goals Pt Will Perform Grooming: with min guard assist;standing Pt Will Perform Upper Body Bathing: with modified independence;sitting Pt Will Perform Lower Body Bathing: with min assist;sit to/from stand Pt Will Transfer to Toilet: with min assist;stand pivot transfer Pt Will Perform Toileting - Clothing Manipulation and hygiene: with min assist;sit to/from stand  Plan Discharge plan remains appropriate    Co-evaluation                 AM-PAC OT "6 Clicks" Daily Activity     Outcome Measure   Help from another person eating meals?: None Help from another person taking care of personal grooming?: A Little Help from another person toileting, which includes using toliet, bedpan, or urinal?: A Lot Help from another person bathing (including washing, rinsing, drying)?: A Little Help from another person to put on and taking off regular upper body clothing?: A Little Help from another person to put on and taking off regular lower body clothing?: A Lot 6 Click Score: 17    End of Session Equipment Utilized During Treatment: Gait belt;Rolling  walker  OT Visit Diagnosis: Unsteadiness on feet (R26.81);Other abnormalities of gait and mobility (R26.89);History of falling (Z91.81);Muscle weakness (generalized) (M62.81)   Activity Tolerance Patient tolerated treatment well   Patient Left in chair;with call bell/phone within reach;with chair alarm set   Nurse Communication Mobility status        Time: 8502-7741 OT Time Calculation (min): 33 min  Charges: OT General Charges $OT Visit: 1 Visit OT Treatments $Self Care/Home Management : 8-22 mins $Therapeutic Activity: 8-22 mins  Peterson Ao OTR/L [phone]   Peterson Ao 02/20/2020, 9:42 AM

## 2020-02-20 NOTE — Consult Note (Signed)
WOC Nurse Consult Note: Reason for Consult: assessment  Wound type: Stage 2 Pressure Injury Pressure Injury POA: No Measurement: 0.3cm x 0.2cm x 0.1cm  Wound bed: partial thickness skin loss, 100% clean and pink Drainage (amount, consistency, odor) minimal, serous, no odor Periwound: Intact, blanchable skin  Dressing procedure/placement/frequency: Continue silicone foam as implemented by skin care order set.  Turn and reposition frequently. Explained importance to the patient as well.   Discussed POC with patient and bedside nurse.  Re consult if needed, will not follow at this time. Thanks  Drewey Begue [employer], RN,CWOCN, CNS, CWON-AP [phone])

## 2020-02-20 NOTE — Plan of Care (Signed)
  Problem: Health Behavior/Discharge Planning: Goal: Ability to manage health-related needs will improve Outcome: Progressing   Problem: Clinical Measurements: Goal: Will remain free from infection Outcome: Progressing   Problem: Activity: Goal: Risk for activity intolerance will decrease Outcome: Progressing   

## 2020-02-21 LAB — GLUCOSE, CAPILLARY
Glucose-Capillary: 176 mg/dL — ABNORMAL HIGH (ref 70–99)
Glucose-Capillary: 142 mg/dL — ABNORMAL HIGH (ref 70–99)
Glucose-Capillary: 185 mg/dL — ABNORMAL HIGH (ref 70–99)
Glucose-Capillary: 145 mg/dL — ABNORMAL HIGH (ref 70–99)

## 2020-02-21 NOTE — Progress Notes (Signed)
PROGRESS NOTE    Chris Bowman  LGX:[id number] DOB: 02/13/1961 DOA: 02/01/2020 PCP: Patient, No Pcp Per   Brief Narrative: 59 year old male with Unknown medical history, who has not seen a doctor [id number] presented to the ER on 3/4 with weakness and falls x2 weeks and associated fever dyspnea cough, chronic buttock pain since the fall along with limitation of range of motion, right arm pain. 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 Patient is on Ancef as per ID and planned for 6 weeks of IV antibiotic.  PT/OT recommended skilled nursing facility.   Awaiting Medicaid application, sister working with a Development worker, community, awaiting placement.  Had prolonged hospital course. .  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  Seen by ID, orthopedics, neurosurgery.  TEE negative for endocarditis.  Status post I&D of ulcer on the left great toe.   Neurosurgery recommended 6 weeks to 3 months of antibiotic, repeat MRI C-spine with and without contrast in 6 weeks.  Repeat blood cultures have been negative.  ID was following.  Plan for continue Ancef for 6 weeks course.  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  Case discussed with ENT, recommended antibiotic management, no surgical intervention.  AKI: Likely associated to ATN in the setting of sepsis.  No obstruction ultrasound.  Currently kidney function is stable.  Diabetes mellitus with hyperglycemia: A1c uncontrolled at 11.3.  Not on insulin at home.  Currently on 70/30 insulin.  Diabetic coordinator was following.  Left foot ulcer: Continue wound care.  Status post right TMA  Generalized weakness/frequent falls: PT/OT recommended skilled nursing facility.  Elevated liver enzymes: Likely associated with sepsis, much improved.  Ultrasound showed cholelithiasis.  Microcytic anemia: Currently hemoglobin stable.  Delusional disorder/impaired memory: MRI showed mild cerebral atrophy, chronic small vessel ischemia.  Currently alert, awake and oriented, follows commands.     Nutrition Problem: Increased nutrient needs Etiology: wound healing      DVT prophylaxis:SCD Code Status: Full Family Communication: None present at the bedside.  Discussed the plan with the patient, he understands very well Disposition Plan: Patient is from home.  Physical therapy/Occupational Therapy recommended skilled nursing facility.  Patient is medically stable for discharge.  Awaiting Medicaid application, sister working with a Development worker, community, awaiting placement.  Consultants: ID, orthopedics, neurosurgery  Procedures: I&D, PICC line placement,TMA right  Antimicrobials:  Anti-infectives (From admission, onward)   Start     Dose/Rate Route Frequency Ordered Stop   02/03/20 1130  ceFAZolin (  ANCEF)  IVPB 1 g/50 mL premix  Status:  Discontinued     1 g 100 mL/hr over 30 Minutes Intravenous Every 6 hours 02/03/20 1124 02/03/20 1139   02/02/20 1100  vancomycin (VANCOREADY) IVPB 750 mg/150 mL  Status:  Discontinued     750 mg 150 mL/hr over 60 Minutes Intravenous Every 24 hours 02/01/20 1218 02/02/20 0221   02/02/20 0600  ceFAZolin (ANCEF) IVPB 2g/100 mL premix     2 g 200 mL/hr over 30 Minutes Intravenous Every 8 hours 02/02/20 0221     02/01/20 2230  ceFEPIme (MAXIPIME) 2 g in sodium chloride 0.9 % 100 mL IVPB  Status:  Discontinued     2 g 200 mL/hr over 30 Minutes Intravenous Every 12 hours 02/01/20 1218 02/02/20 0221   02/01/20 1200  metroNIDAZOLE (FLAGYL) IVPB 500 mg  Status:  Discontinued     500 mg 100 mL/hr over 60 Minutes Intravenous Every 8 hours 02/01/20 1149 02/02/20 0221   02/01/20 1015  ceFEPIme (MAXIPIME) 2 g in sodium chloride 0.9 % 100 mL IVPB     2 g 200 mL/hr over 30 Minutes Intravenous  Once 02/01/20 1008 02/01/20 1056   02/01/20 1015  vancomycin (VANCOREADY) IVPB 1750 mg/350 mL     1,750 mg 175 mL/hr over 120 Minutes Intravenous  Once 02/01/20 1008 02/01/20 1336      Subjective: Patient seen and examined the bedside this morning.  Hemodynamically stable.  Comfortable.  Denies any complaints today.  Objective: Vitals:   02/20/20 2033 02/21/20 0504 02/21/20 0524 02/21/20 0856  BP: (!) 149/96 (!) 159/88  (!) 158/88  Pulse: 86 (!) 101 99 96  Resp: 20 (!) 22  16  Temp: 98.7 F (37.1 C) 98.2 F (36.8 C)  98.2 F (36.8 C)  TempSrc: Oral Oral  Oral  SpO2:  96%  100%  Weight:      Height:        Intake/Output Summary (Last 24 hours) at 02/21/2020 1206 Last data filed at 02/21/2020 1046 Gross per 24 hour  Intake 1964.36 ml  Output 3150 ml  Net -1185.64 ml   Filed Weights   02/11/20 2127 02/15/20 2113 02/19/20 2109  Weight: 89.8 kg 77.1 kg 73 kg    Examination:   General exam: Comfortable Respiratory system:  no wheezes or crackles  Cardiovascular  system: S1 & S2 heard, RRR Gastrointestinal system: Abdomen is nondistended, soft and nontender. No organomegaly or masses felt. Normal bowel sounds heard. Central nervous system: Alert and oriented. No focal neurological deficits. Extremities: No edema, no clubbing ,no cyanosis, right TMA PICC line in the right arm Skin: No rashes, lesions or ulcers,no icterus ,no pallor   Data Reviewed: I have personally reviewed following labs and imaging studies  CBC: Recent Labs  Lab 02/15/20 0325 02/16/20 0402 02/17/20 0427 02/18/20 0319 02/19/20 0407  WBC 8.2 8.2 7.3 6.7 6.4  NEUTROABS 4.6 5.0 4.4 3.7 3.5  HGB 8.8* 8.7* 8.6* 8.6* 8.7*  HCT 28.3* 28.7* 28.7* 28.2* 28.5*  MCV 73.3* 73.2* 73.4* 73.4* 74.4*  PLT 410* 387 366 370 196   Basic Metabolic Panel: Recent Labs  Lab 02/15/20 0325 02/18/20 0319 02/19/20 0407  NA 134* 137 136  K 4.3 4.2 3.9  CL 103 107 104  CO2 22 23 22   GLUCOSE 195* 151* 169*  BUN 31* 21* 20  CREATININE 1.20 0.90 1.02  CALCIUM 8.5* 8.6* 8.6*   GFR: Estimated Creatinine Clearance: 77.3 mL/min (by C-G formula based on SCr of  1.02 mg/dL). Liver Function Tests: Recent Labs  Lab 02/15/20 0325  AST 23  ALT 17  ALKPHOS 154*  BILITOT 0.2*  PROT 6.3*  ALBUMIN 1.9*   No results for input(s): LIPASE, AMYLASE in the last 168 hours. No results for input(s): AMMONIA in the last 168 hours. Coagulation Profile: No results for input(s): INR, PROTIME in the last 168 hours. Cardiac Enzymes: No results for input(s): CKTOTAL, CKMB, CKMBINDEX, TROPONINI in the last 168 hours. BNP (last 3 results) No results for input(s): PROBNP in the last 8760 hours. HbA1C: No results for input(s): HGBA1C in the last 72 hours. CBG: Recent Labs  Lab 02/20/20 1122 02/20/20 1555 02/20/20 2151 02/21/20 0657 02/21/20 1118  GLUCAP 146* 103* 156* 176* 142*   Lipid Profile: No results for input(s): CHOL, HDL, LDLCALC, TRIG, CHOLHDL, LDLDIRECT in the last 72 hours. Thyroid  Function Tests: No results for input(s): TSH, T4TOTAL, FREET4, T3FREE, THYROIDAB in the last 72 hours. Anemia Panel: No results for input(s): VITAMINB12, FOLATE, FERRITIN, TIBC, IRON, RETICCTPCT in the last 72 hours. Sepsis Labs: No results for input(s): PROCALCITON, LATICACIDVEN in the last 168 hours.  No results found for this or any previous visit (from the past 240 hour(s)).       Radiology Studies: No results found.      Scheduled Meds: . Chlorhexidine Gluconate Cloth  6 each Topical Daily  . feeding supplement (GLUCERNA SHAKE)  237 mL Oral TID BM  . feeding supplement (PRO-STAT SUGAR FREE 64)  30 mL Oral BID  . insulin aspart  0-15 Units Subcutaneous TID WC  . insulin aspart  0-5 Units Subcutaneous QHS  . insulin aspart protamine- aspart  8 Units Subcutaneous BID WC  . insulin starter kit- pen needles  1 kit Other Once  . multivitamin with minerals  1 tablet Oral Daily  . tamsulosin  0.4 mg Oral Daily  . vitamin B-12  1,000 mcg Oral Daily   Continuous Infusions: . sodium chloride 50 mL/hr at 02/21/20 1045  .  ceFAZolin (ANCEF) IV 2 g (02/21/20 0546)  . lactated ringers Stopped (02/03/20 1146)  . methocarbamol (ROBAXIN) IV       LOS: 20 days    Time spent: 15 mins.More than 50% of that time was spent in counseling and/or coordination of care.      Shelly Coss, MD [facility] P3/24/2021, 12:06 PM

## 2020-02-22 LAB — GLUCOSE, CAPILLARY
Glucose-Capillary: 161 mg/dL — ABNORMAL HIGH (ref 70–99)
Glucose-Capillary: 138 mg/dL — ABNORMAL HIGH (ref 70–99)
Glucose-Capillary: 151 mg/dL — ABNORMAL HIGH (ref 70–99)
Glucose-Capillary: 99 mg/dL (ref 70–99)

## 2020-02-22 NOTE — Progress Notes (Signed)
PROGRESS NOTE    Chris Bowman  SAY:[id number] DOB: [birth year] DOA: 02/01/2020 PCP: Patient, No Pcp Per   Brief Narrative: 59 year old male with Unknown medical history, who has not seen a doctor [id number] presented to the ER on 3/4 with weakness and falls x2 weeks and associated fever dyspnea cough, chronic buttock pain since the fall along with limitation of range of motion, right arm pain. 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 Patient is on Ancef as per ID and planned for 6 weeks of IV antibiotic.  PT/OT recommended skilled nursing facility.   Awaiting Medicaid application, sister working with a Development worker, community, awaiting placement.  Had prolonged hospital course. .  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  Seen by ID, orthopedics, neurosurgery.  TEE negative for endocarditis.  Status post I&D of ulcer on the left great toe.   Neurosurgery recommended 6 weeks to 3 months of antibiotic, repeat MRI C-spine with and without contrast in 6 weeks.  Repeat blood cultures have been negative.  ID was following.  Plan for continue Ancef for 6 weeks course.  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  Case discussed with ENT, recommended antibiotic management, no surgical intervention.  AKI: Likely associated to ATN in the setting of sepsis.  No obstruction ultrasound.  Currently kidney function is stable.  Diabetes mellitus with hyperglycemia: A1c uncontrolled at 11.3.  Not on insulin at home.  Currently on 70/30 insulin.  Diabetic coordinator was following.  Left foot ulcer: Continue wound care.  Status post right TMA  Generalized weakness/frequent falls: PT/OT recommended skilled nursing facility.  Elevated liver enzymes: Likely associated with sepsis, much improved.  Ultrasound showed cholelithiasis.  Microcytic anemia: Currently hemoglobin stable.  Delusional disorder/impaired memory: MRI showed mild cerebral atrophy, chronic small vessel ischemia.  Currently alert, awake and oriented, follows commands.     Nutrition Problem: Increased nutrient needs Etiology: wound healing      DVT prophylaxis:SCD Code Status: Full Family Communication: None present at the bedside.  Discussed the plan with the patient, he understands very well Disposition Plan: Patient is from home.  Physical therapy/Occupational Therapy recommended skilled nursing facility.  Patient is medically stable for discharge.  Awaiting Medicaid application, sister working with a Development worker, community, awaiting placement.  Consultants: ID, orthopedics, neurosurgery  Procedures: I&D, PICC line placement,TMA right  Antimicrobials:  Anti-infectives (From admission, onward)   Start     Dose/Rate Route Frequency Ordered Stop   02/03/20 1130  ceFAZolin (  ANCEF)  IVPB 1 g/50 mL premix  Status:  Discontinued     1 g 100 mL/hr over 30 Minutes Intravenous Every 6 hours 02/03/20 1124 02/03/20 1139   02/02/20 1100  vancomycin (VANCOREADY) IVPB 750 mg/150 mL  Status:  Discontinued     750 mg 150 mL/hr over 60 Minutes Intravenous Every 24 hours 02/01/20 1218 02/02/20 0221   02/02/20 0600  ceFAZolin (ANCEF) IVPB 2g/100 mL premix     2 g 200 mL/hr over 30 Minutes Intravenous Every 8 hours 02/02/20 0221     02/01/20 2230  ceFEPIme (MAXIPIME) 2 g in sodium chloride 0.9 % 100 mL IVPB  Status:  Discontinued     2 g 200 mL/hr over 30 Minutes Intravenous Every 12 hours 02/01/20 1218 02/02/20 0221   02/01/20 1200  metroNIDAZOLE (FLAGYL) IVPB 500 mg  Status:  Discontinued     500 mg 100 mL/hr over 60 Minutes Intravenous Every 8 hours 02/01/20 1149 02/02/20 0221   02/01/20 1015  ceFEPIme (MAXIPIME) 2 g in sodium chloride 0.9 % 100 mL IVPB     2 g 200 mL/hr over 30 Minutes Intravenous  Once 02/01/20 1008 02/01/20 1056   02/01/20 1015  vancomycin (VANCOREADY) IVPB 1750 mg/350 mL     1,750 mg 175 mL/hr over 120 Minutes Intravenous  Once 02/01/20 1008 02/01/20 1336      Subjective: Patient seen and examined at the bedside this morning.  Hemodynamically stable.  Comfortable.  Complains of some neck pain but he says it  is tolerable with Tylenol  Objective: Vitals:   02/21/20 1241 02/21/20 1632 02/21/20 2108 02/22/20 0411  BP: (!) 145/73 127/89 (!) 160/85 106/65  Pulse: 95 89 92 90  Resp:  18 20 20   Temp: 97.7 F (36.5 C) 97.7 F (36.5 C) 97.9 F (36.6 C) 97.8 F (36.6 C)  TempSrc: Oral Oral Oral Oral  SpO2: 100% 100% 98% 98%  Weight:      Height:        Intake/Output Summary (Last 24 hours) at 02/22/2020 4492 Last data filed at 02/22/2020 0601 Gross per 24 hour  Intake 2200.71 ml  Output 2975 ml  Net -774.29 ml   Filed Weights   02/11/20 2127 02/15/20 2113 02/19/20 2109  Weight: 89.8 kg 77.1 kg 73 kg    Examination:   General exam:  Comfortable Respiratory system:  no wheezes or crackles  Cardiovascular system: S1 & S2 heard, RRR Gastrointestinal system: Abdomen is nondistended, soft and nontender. No organomegaly or masses felt. Normal bowel sounds heard. Central nervous system: Alert and oriented. No focal neurological deficits. Extremities: No edema, no clubbing ,no cyanosis, right TMA PICC line in the right arm Skin: No rashes, lesions or ulcers,no icterus ,no pallor   Data Reviewed: I have personally reviewed following labs and imaging studies  CBC: Recent Labs  Lab 02/16/20 0402 02/17/20 0427 02/18/20 0319 02/19/20 0407  WBC 8.2 7.3 6.7 6.4  NEUTROABS 5.0 4.4 3.7 3.5  HGB 8.7* 8.6* 8.6* 8.7*  HCT 28.7* 28.7* 28.2* 28.5*  MCV 73.2* 73.4* 73.4* 74.4*  PLT 387 366 370 010   Basic Metabolic Panel: Recent Labs  Lab 02/18/20 0319 02/19/20 0407  NA 137 136  K 4.2 3.9  CL 107 104  CO2 23 22  GLUCOSE 151* 169*  BUN 21* 20  CREATININE 0.90 1.02  CALCIUM 8.6* 8.6*   GFR: Estimated Creatinine Clearance: 77.3 mL/min (by C-G formula based on SCr of 1.02 mg/dL). Liver Function Tests: No results  for input(s): AST, ALT, ALKPHOS, BILITOT, PROT, ALBUMIN in the last 168 hours. No results for input(s): LIPASE, AMYLASE in the last 168 hours. No results for input(s): AMMONIA in the last 168 hours. Coagulation Profile: No results for input(s): INR, PROTIME in the last 168 hours. Cardiac Enzymes: No results for input(s): CKTOTAL, CKMB, CKMBINDEX, TROPONINI in the last 168 hours. BNP (last 3 results) No results for input(s): PROBNP in the last 8760 hours. HbA1C: No results for input(s): HGBA1C in the last 72 hours. CBG: Recent Labs  Lab 02/21/20 0657 02/21/20 1118 02/21/20 1631 02/21/20 2139 02/22/20 0637  GLUCAP 176* 142* 185* 145* 161*   Lipid Profile: No results for input(s): CHOL, HDL, LDLCALC, TRIG, CHOLHDL, LDLDIRECT in the last 72 hours. Thyroid Function Tests: No results for input(s): TSH,  T4TOTAL, FREET4, T3FREE, THYROIDAB in the last 72 hours. Anemia Panel: No results for input(s): VITAMINB12, FOLATE, FERRITIN, TIBC, IRON, RETICCTPCT in the last 72 hours. Sepsis Labs: No results for input(s): PROCALCITON, LATICACIDVEN in the last 168 hours.  No results found for this or any previous visit (from the past 240 hour(s)).       Radiology Studies: No results found.      Scheduled Meds: . Chlorhexidine Gluconate Cloth  6 each Topical Daily  . feeding supplement (GLUCERNA SHAKE)  237 mL Oral TID BM  . feeding supplement (PRO-STAT SUGAR FREE 64)  30 mL Oral BID  . insulin aspart  0-15 Units Subcutaneous TID WC  . insulin aspart  0-5 Units Subcutaneous QHS  . insulin aspart protamine- aspart  8 Units Subcutaneous BID WC  . insulin starter kit- pen needles  1 kit Other Once  . multivitamin with minerals  1 tablet Oral Daily  . tamsulosin  0.4 mg Oral Daily  . vitamin B-12  1,000 mcg Oral Daily   Continuous Infusions: . sodium chloride 50 mL/hr at 02/21/20 1045  .  ceFAZolin (ANCEF) IV 2 g (02/22/20 0601)  . lactated ringers Stopped (02/03/20 1146)  . methocarbamol (ROBAXIN) IV       LOS: 21 days    Time spent: 15 mins.More than 50% of that time was spent in counseling and/or coordination of care.      Shelly Coss, MD [facility] P3/25/2021, 8:07 AM

## 2020-02-22 NOTE — TOC Progression Note (Addendum)
Transition of Care [facility]) - Progression Note    Patient Details  Name: Chris Bowman MRN: [id number] Date of Birth: 1961/05/29  Transition of [facility]) CM/SW Contact  Okey Dupre Lazaro Arms, LCSW Phone Number: 02/22/2020, 2:16 PM  Clinical Narrative:  Call made to Ms. Calton Dach, SW for unassigned veterans regarding her receipt of VA paperwork and SNF placement for patient. Ms. Nelwyn Salisbury confirmed that she received the paperwork and indicated that she would fax information regarding applying for SNF placement for patient. Paperwork received, completed, signed by MD and faxed to [facility] - Admissions/Discharge Nurse [phone] fax number).   Visited with patient and provided him with update on application being sent to [facility] for SNF placement. Patient expressed understanding and appreciation for CSW's assistance.  Talked with sister, [name] [phone]) by phone to update her on faxing application to the [place] for SNF placement. Ms. [name] reported that she got a call from the [place] regarding something about his insurance coverage she thinks and has to do a follow-up call. Ms. [name] not totally sure what is going on, but will update CSW when she knows more.      Expected Discharge Plan: Skilled Nursing Facility Barriers to Discharge: Inadequate or no insurance, SNF Pending bed offer, Barriers Unresolved (comment)(02/20/20 - Patient and sister working on [place] paperwork)  Expected Discharge Plan and Services Expected Discharge Plan: Skilled Nursing Facility In-house Referral: Clinical Social Work     Living arrangements for the past 2 months: Apartment                                       Social Determinants of Health (SDOH) Interventions  No SDOH interventions requested or needed at this time.  Readmission Risk Interventions No flowsheet data found.

## 2020-02-22 NOTE — Progress Notes (Signed)
Initial Nutrition Assessment  DOCUMENTATION CODES:   Not applicable  INTERVENTION:  Glucerna Shake po TID, each supplement provides 220 kcal and 10 grams of protein  30 ml Prostat BID, each supplement provides 100 kcals and 15 grams protein.   Continue MVI with Minerals  NUTRITION DIAGNOSIS:   Increased nutrient needs related to wound healing as evidenced by estimated needs.  Being addressed via supplements  GOAL:   Patient will meet greater than or equal to 90% of their needs  Met via supplements and improve po   MONITOR:   PO intake, Supplement acceptance, Weight trends, Labs, I & O's, Skin  REASON FOR ASSESSMENT:   Consult Assessment of nutrition requirement/status  ASSESSMENT:   Patient with PMH significant for delusional disorder, essential HTN, and uncontrolled DM. Presents this admission with sepsis likely 2/2 to bilateral foot ulcers.   3/06 Transmetatarsal amputation of right foot with wound vac placement, I&D of left great toe  SNF placement pending  PO intake improving; recorded po intake 100% of all meal yesterday  Labs: reviewed Meds: MVI with Minerals, reglan prn, ss novolog, novolog 70/30, LR at 100 ml/hr, B-12    Diet Order:   Diet Order            Diet Carb Modified Fluid consistency: Thin; Room service appropriate? Yes  Diet effective now              EDUCATION NEEDS:   Not appropriate for education at this time  Skin:  Skin Assessment: Skin Integrity Issues: Skin Integrity Issues:: Other (Comment), Wound VAC Wound Vac: transmetatarsal amputation on right foot on 3/06 Diabetic Ulcer: bilateral feet Other: I&D of L. great toe on 3/06  Last BM:  3/25  Height:   Ht Readings from Last 1 Encounters:  02/01/20 5' 8"  (1.727 m)    Weight:   Wt Readings from Last 1 Encounters:  02/19/20 73 kg    Ideal Body Weight:  70 kg  BMI:  Body mass index is 24.48 kg/m.  Estimated Nutritional Needs:   Kcal:  2100-2300  kcal  Protein:  105-120 grams  Fluid:  >/= 2.1 L/day   Kerman Passey MS, RDN, LDN, CNSC RD Pager Number and Weekend/On-Call After Hours Pager Located in [place]

## 2020-02-23 LAB — GLUCOSE, CAPILLARY
Glucose-Capillary: 168 mg/dL — ABNORMAL HIGH (ref 70–99)
Glucose-Capillary: 151 mg/dL — ABNORMAL HIGH (ref 70–99)
Glucose-Capillary: 153 mg/dL — ABNORMAL HIGH (ref 70–99)
Glucose-Capillary: 143 mg/dL — ABNORMAL HIGH (ref 70–99)

## 2020-02-23 NOTE — Progress Notes (Signed)
Physical Therapy Treatment Patient Details Name: Chris Bowman MRN: [id number] DOB: 11-06-1961 Today's Date: 02/23/2020    History of Present Illness 59 y.o. male was admitted with bacteremia and received TEE to rule out endocarditis causing sepsis.  His R foot received transmet amp on 02/03/20 and L great toe wound I and D. PMHx:  uncontrolled diabetes, essential HTN, delusional disorders. bilateral feet wounds with early indication of osteomyelitis per MRI results from 3/4    PT Comments    Pt motivated to participate in PT and OT today, seen together for mobility progression. Pt ambulated short hallway distance, and required several seated rest breaks throughout session due to fatigue. Pt progressing well, will continue to follow acutely.    Follow Up Recommendations  SNF     Equipment Recommendations  Wheelchair cushion (measurements PT);Wheelchair (measurements PT);3in1 (PT)    Recommendations for Other Services       Precautions / Restrictions Precautions Precautions: Fall Required Braces or Orthoses: Other Brace Other Brace: R post-op shoe Restrictions Weight Bearing Restrictions: Yes RLE Weight Bearing: Non weight bearing    Mobility  Bed Mobility Overal bed mobility: Needs Assistance Bed Mobility: Sit to Supine       Sit to supine: Supervision   General bed mobility comments: supervision for IV line  Transfers Overall transfer level: Needs assistance Equipment used: Rolling walker (2 wheeled) Transfers: Sit to/from Stand Sit to Stand: Min assist         General transfer comment: min assist to steady pt when transitioning UEs from recliner to RW. Verbal cuing for NWB through RLE. STS x4, from recliner x2 and from [facility] x2 during ADL at sink.  Ambulation/Gait Ambulation/Gait assistance: Min assist;+2 safety/equipment Gait Distance (Feet): 25 [phone]) Assistive device: Rolling walker (2 wheeled) Gait Pattern/deviations: Step-through pattern;Decreased  stride length;Trunk flexed Gait velocity: decr   General Gait Details: min assist to steady, verbal cuing for upright posture and placement of RLE within BOS as pt tends to hold RLE out in front of him and use it as momentum. Pt states he prefers having limb in front of BOS.   Stairs             Wheelchair Mobility    Modified Rankin (Stroke Patients Only)       Balance Overall balance assessment: Needs assistance   Sitting balance-Leahy Scale: Good Sitting balance - Comments: no LOB during LB dressing   Standing balance support: During functional activity;Single extremity supported Standing balance-Leahy Scale: Poor Standing balance comment: reliant on external support                            Cognition Arousal/Alertness: Awake/alert Behavior During Therapy: WFL for tasks assessed/performed Overall Cognitive Status: Within Functional Limits for tasks assessed                                 General Comments: pt responds well to choices with regard to order of activities and plan clearly specified      Exercises      General Comments        Pertinent Vitals/Pain Pain Assessment: No/denies pain    Home Living                      Prior Function            PT Goals (current goals can now  be found in the care plan section) Acute Rehab PT Goals Patient Stated Goal: to get home PT Goal Formulation: With patient Time For Goal Achievement: 03/01/20 Potential to Achieve Goals: Good Progress towards PT goals: Progressing toward goals    Frequency    Min 2X/week      PT Plan Current plan remains appropriate    Co-evaluation PT/OT/SLP Co-Evaluation/Treatment: Yes Reason for Co-Treatment: For patient/therapist safety;To address functional/ADL transfers PT goals addressed during session: Mobility/safety with mobility;Proper use of DME;Balance OT goals addressed during session: ADL's and self-care;Proper use of  Adaptive equipment and DME      AM-PAC PT "6 Clicks" Mobility   Outcome Measure  Help needed turning from your back to your side while in a flat bed without using bedrails?: None Help needed moving from lying on your back to sitting on the side of a flat bed without using bedrails?: None Help needed moving to and from a bed to a chair (including a wheelchair)?: A Little Help needed standing up from a chair using your arms (e.g., wheelchair or bedside chair)?: A Little Help needed to walk in hospital room?: A Little Help needed climbing 3-5 steps with a railing? : Total 6 Click Score: 18    End of Session Equipment Utilized During Treatment: Gait belt Activity Tolerance: Patient tolerated treatment well Patient left: with call bell/phone within reach;in bed;with bed alarm set Nurse Communication: Mobility status PT Visit Diagnosis: Unsteadiness on feet (R26.81);Difficulty in walking, not elsewhere classified (R26.2);Repeated falls (R29.6)     Time: 1443-1540 PT Time Calculation (min) (ACUTE ONLY): 30 min  Charges:  $Gait Training: 8-22 mins                     Kaylee Wombles E, PT [place] Pager [phone]  Office [phone]    Rondel Episcopo D Elonda Husky 02/23/2020, 4:03 PM

## 2020-02-23 NOTE — TOC Progression Note (Addendum)
Transition of Care [facility]) - Progression Note    Patient Details  Name: Kadir Azucena MRN: [id number] Date of Birth: 06-09-1961  Transition of [facility]) CM/SW Contact  Okey Dupre Lazaro Arms, LCSW Phone Number: 02/23/2020, 4:26 PM  Clinical Narrative:  CSW received call from [place] with the [place] and was informed that patient has been approved for 32 days at a [place] contracted SNF. Patient's sister Zella Ball contacted CSW and was informed re: [facility] approval and she shared information with patient.  Call made from [facility] SNF list provided: 1. [facility]: Spoke with [place], admissions Interior and spatial designer. They have no beds right now, but CSW advised to call back on Monday.  2. [facility] in [place] [phone]) Spoke with Blanchie Dessert, admissions director. She requested that clinicals be sent to them (fax #[phone]).  3. [facility] [place] [phone]): Spoke with Ezzard Flax, admissions director. She reported no bed at this time, however she requested that clinicals be sent and she will follow-up witih CSW on Monday (fax #[phone]). 4. [facility] in [place] [phone]) Spoke with Carney Living, admissions director and there is a hold on taking VA patient at this time.  Received PASRR number for patient: [id number] E, eff. 3/26 - 03/24/20. CSW will continue search with additional [facility] contracted SNF's.    Expected Discharge Plan: Skilled Nursing Facility Barriers to Discharge: Inadequate or no insurance, SNF Pending bed offer, Barriers Unresolved (comment)(02/20/20 - Patient and sister working on [place] paperwork)  Expected Discharge Plan and Services Expected Discharge Plan: Skilled Nursing Facility In-house Referral: Clinical Social Work     Living arrangements for the past 2 months: Apartment                                       Social Determinants of Health (SDOH) Interventions    Readmission Risk Interventions No flowsheet data found.

## 2020-02-23 NOTE — NC FL2 (Signed)
[place] LEVEL OF CARE SCREENING TOOL     IDENTIFICATION  Patient Name: Chris Bowman Birthdate: Sep 22, 1961 Sex: male Admission Date (Current Location): 02/01/2020  [facility] and [place] Number:  [place] (Pending Medicaid) Facility and Address:  [facility] [place] [address], [place], [place] [address]      Provider Number: [id number]  Attending Physician Name and Address:  Shelly Coss, MD  Relative Name and Phone Number:  Tim Lair -sister; [phone]    Current Level of Care: Hospital Recommended Level of Care: [place] Prior Approval Number:    Date Approved/Denied:   PASRR Number: (PASRR number pending)  Discharge Plan: SNF    Current Diagnoses: Patient Active Problem List   Diagnosis Date Noted  . Bacteremia due to methicillin susceptible Staphylococcus aureus (MSSA) 02/10/2020  . Abscess in epidural space of cervical spine 02/10/2020  . AKI (acute kidney injury) ([place])   . Fever   . Osteomyelitis of foot, right, acute ([place])   . Type 2 diabetes mellitus with hyperglycemia, without long-term current use of insulin ([place]) 02/01/2020  . Ulcer of left foot due to type 2 diabetes mellitus ([place]) 02/01/2020  . Hyponatremia 02/01/2020  . Renal insufficiency 02/01/2020  . Weakness 02/01/2020  . Falls frequently 02/01/2020    Orientation RESPIRATION BLADDER Height & Weight     Self, Time, Situation, Place  Normal Continent Weight: 162 lb (73.5 kg) Height:  5' 8"  (172.7 cm)  BEHAVIORAL SYMPTOMS/MOOD NEUROLOGICAL BOWEL NUTRITION STATUS      Continent Diet(Carb modified)  AMBULATORY STATUS COMMUNICATION OF NEEDS Skin   Limited Assist(Minimal assist per physical therapy) Verbally Other (Comment), Surgical wounds(Abrasion left/right arm, knee, leg, hand; Stage 2 pressure injury to sacrum, incision right foot, incision left foot, diabetic foot ulcer to left medial great toe; daily dressing changes to right toes  amputation site)                       Personal Care Assistance Level of Assistance  Bathing, Feeding, Dressing Bathing Assistance: Limited assistance(Upper body-assistance with set-up; Lower body min assist) Feeding assistance: Independent(Assistance with set-up) Dressing Assistance: Limited assistance(Upper body assistance with set-up; Lower body Min assist)     Functional Limitations Info  Sight, Hearing, Speech Sight Info: Adequate Hearing Info: Adequate Speech Info: Adequate    SPECIAL CARE FACTORS FREQUENCY  PT (By licensed PT), OT (By licensed OT)     PT Frequency: Evaluated 3/5. PT at SNF Eval and Treat, a minimum of 5 days per week OT Frequency: Evaluated 3/5. OT at SNF Eval and Treat, a minimum of 5 days per week            Contractures Contractures Info: Not present    Additional Factors Info  Code Status, Allergies, Insulin Sliding Scale Code Status Info: Full Allergies Info: Codeine   Insulin Sliding Scale Info: 0-5 Units daily at bedtime; 0-15 Units 3 times per day with meals       Current Medications (02/23/2020):  This is the current hospital active medication list Current Facility-Administered Medications  Medication Dose Route Frequency Provider Last Rate Last Admin  . 0.9 %  sodium chloride infusion   Intravenous Continuous Antonieta Pert, MD 50 mL/hr at 02/23/20 0318 New Bag at 02/23/20 0318  . acetaminophen (TYLENOL) tablet 650 mg  650 mg Oral Q6H PRN Newt Minion, MD   650 mg at 02/22/20 1761   Or  . acetaminophen (TYLENOL) suppository 650 mg  650 mg Rectal Q6H PRN Newt Minion, MD      . bisacodyl (DULCOLAX) suppository 10 mg  10 mg Rectal Daily PRN Newt Minion, MD      . ceFAZolin (ANCEF) IVPB 2g/100 mL premix  2 g Intravenous Q8H Newt Minion, MD 200 mL/hr at 02/23/20 1443 2 g at 02/23/20 1443  . Chlorhexidine Gluconate Cloth 2 % PADS 6 each  6 each Topical Daily Newt Minion, MD   6 each at 02/22/20 [phone]  . feeding supplement  (GLUCERNA SHAKE) (GLUCERNA SHAKE) liquid 237 mL  237 mL Oral TID BM Newt Minion, MD   237 mL at 02/13/20 1016  . feeding supplement (PRO-STAT SUGAR FREE 64) liquid 30 mL  30 mL Oral BID Kc, Ramesh, MD   30 mL at 02/23/20 0942  . HYDROmorphone (DILAUDID) injection 0.5-1 mg  0.5-1 mg Intravenous Q4H PRN Newt Minion, MD      . insulin aspart (novoLOG) injection 0-15 Units  0-15 Units Subcutaneous TID WC Newt Minion, MD   3 Units at 02/23/20 1139  . insulin aspart (novoLOG) injection 0-5 Units  0-5 Units Subcutaneous QHS Newt Minion, MD   2 Units at 02/14/20 2141  . insulin aspart protamine- aspart (NOVOLOG MIX 70/30) injection 8 Units  8 Units Subcutaneous BID WC [place] Shi, MD   8 Units at 02/23/20 0850  . insulin starter kit- pen needles (English) 1 kit  1 kit Other Once [place] Shi, MD      . loperamide (IMODIUM) capsule 2 mg  2 mg Oral PRN Antonieta Pert, MD   2 mg at 02/11/20 0845  . magnesium citrate solution 1 Bottle  1 Bottle Oral Once PRN Newt Minion, MD      . methocarbamol (ROBAXIN) tablet 500 mg  500 mg Oral Q6H PRN Newt Minion, MD       Or  . methocarbamol (ROBAXIN) 500 mg in dextrose 5 % 50 mL IVPB  500 mg Intravenous Q6H PRN Newt Minion, MD      . metoCLOPramide (REGLAN) tablet 5-10 mg  5-10 mg Oral Q8H PRN Newt Minion, MD       Or  . metoCLOPramide (REGLAN) injection 5-10 mg  5-10 mg Intravenous Q8H PRN Newt Minion, MD      . multivitamin with minerals tablet 1 tablet  1 tablet Oral Daily Newt Minion, MD   1 tablet at 02/23/20 [phone]  . ondansetron (ZOFRAN) tablet 4 mg  4 mg Oral Q6H PRN Newt Minion, MD   4 mg at 02/04/20 2156   Or  . ondansetron (ZOFRAN) injection 4 mg  4 mg Intravenous Q6H PRN Newt Minion, MD   4 mg at 02/03/20 1020  . oxyCODONE (Oxy IR/ROXICODONE) immediate release tablet 10-15 mg  10-15 mg Oral Q4H PRN Newt Minion, MD      . oxyCODONE (Oxy IR/ROXICODONE) immediate release tablet 5-10 mg  5-10 mg Oral Q4H PRN Newt Minion, MD   5 mg at 02/04/20 2157  . polyethylene glycol (MIRALAX / GLYCOLAX) packet 17 g  17 g Oral Daily PRN Newt Minion, MD   17 g at 02/07/20 1022  . sodium chloride flush (NS) 0.9 % injection 10-40 mL  10-40 mL Intracatheter PRN Antonieta Pert, MD   10 mL at 02/09/20 1341  . tamsulosin (FLOMAX) capsule 0.4 mg  0.4 mg Oral Daily Florencia Reasons, MD   0.4 mg  at 02/23/20 0942  . vitamin B-12 (CYANOCOBALAMIN) tablet 1,000 mcg  1,000 mcg Oral Daily Florencia Reasons, MD   1,000 mcg at 02/23/20 0335     Discharge Medications: Please see discharge summary for a list of discharge medications.  Relevant Imaging Results:  Relevant Lab Results:   Additional Information ss#[id number]  Sable Feil, LCSW

## 2020-02-23 NOTE — Plan of Care (Signed)
  Problem: Health Behavior/Discharge Planning: Goal: Ability to manage health-related needs will improve Outcome: Progressing   

## 2020-02-23 NOTE — Progress Notes (Addendum)
PROGRESS NOTE    Chris Bowman  XTG:[id number] DOB: 1961/09/29 DOA: 02/01/2020 PCP: Patient, No Pcp Per   Brief Narrative: 59 year old male with Unknown medical history, who has not seen a doctor [id number] presented to the ER on 3/4 with weakness and falls x2 weeks and associated fever dyspnea cough, chronic buttock pain since the fall along with limitation of range of motion, right arm pain. 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 Patient is on Ancef as per ID and planned for 6 weeks of IV antibiotic.  PT/OT recommended skilled nursing facility.   Awaiting Medicaid application, sister working with a Development worker, community, awaiting placement.  Had prolonged hospital course. .  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  Seen by ID, orthopedics, neurosurgery.  TEE negative for endocarditis.  Status post I&D of ulcer on the left great toe.   Neurosurgery recommended 6 weeks to 3 months of antibiotic, repeat MRI C-spine with and without contrast in 6 weeks.  Repeat blood cultures have been negative.  ID was following.  Plan for continue Ancef for 6 weeks course.  He needs follow-up with ID as an outpatient Orthopedics following.  Plan is to remove the sutures early next week and then he can begin weightbearing as tolerated.  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  Case discussed with ENT, recommended antibiotic management, no surgical intervention.  AKI: Likely associated to ATN in the setting of sepsis.  No obstruction ultrasound.  Currently kidney function is stable.  Diabetes mellitus with hyperglycemia: A1c uncontrolled at 11.3.  Not on insulin at home.  Currently on 70/30 insulin.  Diabetic coordinator was following.  Left foot ulcer: Continue wound care.  Status post right TMA  Generalized weakness/frequent falls: PT/OT recommended skilled nursing facility.  Elevated liver enzymes: Likely associated with sepsis, much improved.  Ultrasound showed cholelithiasis.  Microcytic anemia: Currently hemoglobin stable.  Delusional disorder/impaired memory: MRI showed mild cerebral atrophy, chronic small vessel ischemia.  Currently alert, awake and oriented, follows commands.     Nutrition Problem: Increased nutrient needs Etiology: wound healing      DVT prophylaxis:SCD Code Status: Full Family Communication: Called sister on phone for update, call not received Disposition Plan: Patient is from home.  Physical therapy/Occupational Therapy recommended skilled nursing facility.  Patient is medically stable for discharge.  Awaiting Medicaid application, sister working with a Development worker, community, awaiting placement.  Consultants: ID, orthopedics, neurosurgery  Procedures: I&D, PICC line placement,TMA right  Antimicrobials:  Anti-infectives (From admission, onward)   Start  Dose/Rate Route Frequency Ordered Stop   02/03/20 1130  ceFAZolin (ANCEF) IVPB 1 g/50 mL premix  Status:  Discontinued     1 g 100 mL/hr over 30 Minutes Intravenous Every 6 hours 02/03/20 1124 02/03/20 1139   02/02/20 1100  vancomycin (VANCOREADY) IVPB 750 mg/150 mL  Status:  Discontinued     750 mg 150 mL/hr over 60 Minutes Intravenous Every 24 hours 02/01/20 1218 02/02/20 0221   02/02/20 0600  ceFAZolin (ANCEF) IVPB 2g/100 mL premix     2 g 200 mL/hr over 30 Minutes Intravenous Every 8 hours 02/02/20 0221     02/01/20 2230  ceFEPIme (MAXIPIME) 2 g in sodium chloride 0.9 % 100 mL IVPB  Status:  Discontinued     2 g 200 mL/hr over 30 Minutes Intravenous Every 12 hours 02/01/20 1218 02/02/20 0221   02/01/20 1200  metroNIDAZOLE (FLAGYL) IVPB 500 mg  Status:  Discontinued     500 mg 100 mL/hr over 60 Minutes Intravenous Every 8 hours 02/01/20 1149 02/02/20 0221   02/01/20 1015  ceFEPIme (MAXIPIME) 2 g in sodium chloride 0.9 % 100 mL IVPB     2 g 200 mL/hr over 30 Minutes Intravenous  Once 02/01/20 1008 02/01/20 1056   02/01/20 1015  vancomycin (VANCOREADY) IVPB 1750 mg/350 mL     1,750 mg 175 mL/hr over 120 Minutes Intravenous  Once 02/01/20 1008 02/01/20 1336      Subjective: Patient seen and examined the bedside this morning.  Hemodynamically stable.  No new changes from yesterday.  Comfortable.  Objective: Vitals:   02/22/20 1634 02/22/20 2107 02/23/20 0448 02/23/20 0859  BP: (!) 155/82 (!) 141/86 (!) 152/82 114/88  Pulse: 88 87 100 98  Resp: _0 Temp:  97.8 F (36.6 C) 98.1 F (36.7 C) 98.7 F (37.1 C)  TempSrc:  Oral Oral Oral  SpO2: 100% 98% 97% 100%  Weight:  73.5 kg    Height:        Intake/Output Summary (Last 24 hours) at 02/23/2020 1214 Last data filed at 02/23/2020 0915 Gross per 24 hour  Intake 1987.15 ml  Output 2350 ml  Net -362.85 ml   Filed Weights   02/15/20 2113 02/19/20 2109 02/22/20 2107  Weight: 77.1 kg 73 kg 73.5 kg     Examination:   General exam: Comfortable Respiratory system:no wheezes or crackles  Cardiovascular system: S1 & S2 heard, RRR. No JVD, murmurs, rubs, gallops or clicks. Gastrointestinal system: Abdomen is nondistended, soft and nontender. No organomegaly or masses felt. Normal bowel sounds heard. Central nervous system: Alert and oriented. No focal neurological deficits. Extremities: No edema, no clubbing ,no cyanosis, PICC line in the right arm .  Status post right TMA, right foot covered with dressing skin: No rashes, lesions or ulcers,no icterus ,no pallor   Data Reviewed: I have personally reviewed following labs and imaging studies  CBC: Recent Labs  Lab 02/17/20 0427 02/18/20 0319 02/19/20 0407  WBC 7.3 6.7 6.4  NEUTROABS 4.4 3.7 3.5  HGB 8.6* 8.6* 8.7*  HCT 28.7* 28.2* 28.5*  MCV 73.4* 73.4* 74.4*  PLT 366 370 409   Basic Metabolic Panel: Recent Labs  Lab 02/18/20 0319 02/19/20 0407  NA 137 136  K 4.2 3.9  CL 107 104  CO2 23 22  GLUCOSE 151* 169*  BUN 21* 20  CREATININE 0.90 1.02  CALCIUM 8.6* 8.6*   GFR: Estimated Creatinine Clearance: 77.3 mL/min (by C-G formula based on SCr of 1.02 mg/dL).  Liver Function Tests: No results for input(s): AST, ALT, ALKPHOS, BILITOT, PROT, ALBUMIN in the last 168 hours. No results for input(s): LIPASE, AMYLASE in the last 168 hours. No results for input(s): AMMONIA in the last 168 hours. Coagulation Profile: No results for input(s): INR, PROTIME in the last 168 hours. Cardiac Enzymes: No results for input(s): CKTOTAL, CKMB, CKMBINDEX, TROPONINI in the last 168 hours. BNP (last 3 results) No results for input(s): PROBNP in the last 8760 hours. HbA1C: No results for input(s): HGBA1C in the last 72 hours. CBG: Recent Labs  Lab 02/22/20 1107 02/22/20 1624 02/22/20 2109 02/23/20 0633 02/23/20 1118  GLUCAP 138* 151* 99 168* 151*   Lipid Profile: No results for input(s): CHOL, HDL, LDLCALC, TRIG, CHOLHDL,  LDLDIRECT in the last 72 hours. Thyroid Function Tests: No results for input(s): TSH, T4TOTAL, FREET4, T3FREE, THYROIDAB in the last 72 hours. Anemia Panel: No results for input(s): VITAMINB12, FOLATE, FERRITIN, TIBC, IRON, RETICCTPCT in the last 72 hours. Sepsis Labs: No results for input(s): PROCALCITON, LATICACIDVEN in the last 168 hours.  No results found for this or any previous visit (from the past 240 hour(s)).       Radiology Studies: No results found.      Scheduled Meds: . Chlorhexidine Gluconate Cloth  6 each Topical Daily  . feeding supplement (GLUCERNA SHAKE)  237 mL Oral TID BM  . feeding supplement (PRO-STAT SUGAR FREE 64)  30 mL Oral BID  . insulin aspart  0-15 Units Subcutaneous TID WC  . insulin aspart  0-5 Units Subcutaneous QHS  . insulin aspart protamine- aspart  8 Units Subcutaneous BID WC  . insulin starter kit- pen needles  1 kit Other Once  . multivitamin with minerals  1 tablet Oral Daily  . tamsulosin  0.4 mg Oral Daily  . vitamin B-12  1,000 mcg Oral Daily   Continuous Infusions: . sodium chloride 50 mL/hr at 02/23/20 0318  .  ceFAZolin (ANCEF) IV 2 g (02/23/20 0541)  . lactated ringers Stopped (02/03/20 1146)  . methocarbamol (ROBAXIN) IV       LOS: 22 days    Time spent: 15 mins.More than 50% of that time was spent in counseling and/or coordination of care.      Shelly Coss, MD [facility] P3/26/2021, 12:14 PM

## 2020-02-23 NOTE — Progress Notes (Signed)
Occupational Therapy Treatment Patient Details Name: Chris Bowman MRN: [id number] DOB: 05/22/1961 Today's Date: 02/23/2020    History of present illness 59 y.o. male was admitted with bacteremia and received TEE to rule out endocarditis causing sepsis.  His R foot received transmet amp on 02/03/20 and L great toe wound I and D. PMHx:  uncontrolled diabetes, essential HTN, delusional disorders. bilateral feet wounds with early indication of osteomyelitis per MRI results from 3/4   OT comments  Focus of session on dressing and grooming. Pt able to don front opening gown and R post op shoe with min assist and his R shoe with set up. Ambulated to sink and completed oral care with min assist and cues to sit to set up toothbrush in sitting as it requires B UE use. Pt fatigues quickly in static standing and needed seated rest break prior to ambulating in the hall. Pt returned to bed at end of session. Progress steadily.  Follow Up Recommendations  SNF;Supervision/Assistance - 24 hour    Equipment Recommendations  3 in 1 bedside commode    Recommendations for Other Services      Precautions / Restrictions Precautions Precautions: Fall Required Braces or Orthoses: Other Brace Other Brace: R post-op shoe Restrictions Weight Bearing Restrictions: Yes RLE Weight Bearing: Non weight bearing       Mobility Bed Mobility Overal bed mobility: Needs Assistance Bed Mobility: Sit to Sidelying       Sit to supine: Supervision   General bed mobility comments: supervision for IV line  Transfers Overall transfer level: Needs assistance Equipment used: Rolling walker (2 wheeled) Transfers: Sit to/from Stand Sit to Stand: Min assist         General transfer comment: min assist to steady as he moves UEs from chair to walker, verbal cues to kick L LE out to avoid weight bearing    Balance Overall balance assessment: Needs assistance   Sitting balance-Leahy Scale: Good Sitting balance -  Comments: no LOB during LB dressing   Standing balance support: Single extremity supported   Standing balance comment: requires at least one hand support on sink during grooming, B UE support during ambulation                           ADL either performed or assessed with clinical judgement   ADL Overall ADL's : Needs assistance/impaired     Grooming: Oral care;Sitting;Standing;Minimal assistance Grooming Details (indicate cue type and reason): cues to sit while setting up toothbrush requiring B UE use, pt fatigues after approximately 2 minutes of static standing         Upper Body Dressing : Minimal assistance;Sitting Upper Body Dressing Details (indicate cue type and reason): min assist due to IV line Lower Body Dressing: Minimal assistance;Sitting/lateral leans Lower Body Dressing Details (indicate cue type and reason): min assist for R LE post op shoe             Functional mobility during ADLs: Minimal assistance;Rolling walker;+2 for safety/equipment(chair follow)       Vision       Perception     Praxis      Cognition Arousal/Alertness: Awake/alert Behavior During Therapy: WFL for tasks assessed/performed Overall Cognitive Status: Within Functional Limits for tasks assessed                                 General Comments: pt  responds well to choices with regard to order of activities and plan clearly specified        Exercises     Shoulder Instructions       General Comments      Pertinent Vitals/ Pain       Pain Assessment: No/denies pain  Home Living                                          Prior Functioning/Environment              Frequency  Min 2X/week        Progress Toward Goals  OT Goals(current goals can now be found in the care plan section)  Progress towards OT goals: Progressing toward goals  Acute Rehab OT Goals Patient Stated Goal: to get home OT Goal Formulation: With  patient Time For Goal Achievement: 03/01/20 Potential to Achieve Goals: Good  Plan Discharge plan remains appropriate    Co-evaluation    PT/OT/SLP Co-Evaluation/Treatment: Yes Reason for Co-Treatment: For patient/therapist safety   OT goals addressed during session: ADL's and self-care;Proper use of Adaptive equipment and DME      AM-PAC OT "6 Clicks" Daily Activity     Outcome Measure   Help from another person eating meals?: None Help from another person taking care of personal grooming?: A Little Help from another person toileting, which includes using toliet, bedpan, or urinal?: A Little Help from another person bathing (including washing, rinsing, drying)?: A Little Help from another person to put on and taking off regular upper body clothing?: A Little Help from another person to put on and taking off regular lower body clothing?: A Little 6 Click Score: 19    End of Session Equipment Utilized During Treatment: Gait belt;Rolling walker(post op shoe)  OT Visit Diagnosis: Unsteadiness on feet (R26.81);Other abnormalities of gait and mobility (R26.89);History of falling (Z91.81);Muscle weakness (generalized) (M62.81)   Activity Tolerance Patient tolerated treatment well   Patient Left in bed;with call bell/phone within reach   Nurse Communication          Time: 9892-1194 OT Time Calculation (min): 34 min  Charges: OT General Charges $OT Visit: 1 Visit OT Treatments $Self Care/Home Management : 8-22 mins  Martie Round, OTR/L Acute Rehabilitation Services Pager: [phone] Office: [phone]   Evern Bio 02/23/2020, 3:15 PM

## 2020-02-23 NOTE — Progress Notes (Signed)
POD20 s/p right TMA. Doing well. Nursing doing daily dressing changes. Dressing removed. Incicsion : well opposed healthy wound edges. Wound is healed centrally. Periphery in final stages. Swelling well controlled, No cellulitis or necrosis. Will plan removel of sutures early next week can then begin WBAT.

## 2020-02-24 LAB — CBC WITH DIFFERENTIAL/PLATELET
Abs Immature Granulocytes: 0.02 10*3/uL (ref 0.00–0.07)
Basophils Absolute: 0.1 10*3/uL (ref 0.0–0.1)
Basophils Relative: 1 %
Eosinophils Absolute: 0.1 10*3/uL (ref 0.0–0.5)
Eosinophils Relative: 2 %
HCT: 29.8 % — ABNORMAL LOW (ref 39.0–52.0)
Hemoglobin: 9 g/dL — ABNORMAL LOW (ref 13.0–17.0)
Immature Granulocytes: 0 %
Lymphocytes Relative: 31 %
Lymphs Abs: 2.3 10*3/uL (ref 0.7–4.0)
MCH: 22.1 pg — ABNORMAL LOW (ref 26.0–34.0)
MCHC: 30.2 g/dL (ref 30.0–36.0)
MCV: 73.2 fL — ABNORMAL LOW (ref 80.0–100.0)
Monocytes Absolute: 0.6 10*3/uL (ref 0.1–1.0)
Monocytes Relative: 8 %
Neutro Abs: 4.3 10*3/uL (ref 1.7–7.7)
Neutrophils Relative %: 58 %
Platelets: 307 10*3/uL (ref 150–400)
RBC: 4.07 MIL/uL — ABNORMAL LOW (ref 4.22–5.81)
RDW: 16.7 % — ABNORMAL HIGH (ref 11.5–15.5)
WBC: 7.3 10*3/uL (ref 4.0–10.5)
nRBC: 0 % (ref 0.0–0.2)

## 2020-02-24 LAB — BASIC METABOLIC PANEL
Anion gap: 10 (ref 5–15)
BUN: 19 mg/dL (ref 6–20)
CO2: 24 mmol/L (ref 22–32)
Calcium: 9 mg/dL (ref 8.9–10.3)
Chloride: 104 mmol/L (ref 98–111)
Creatinine, Ser: 0.92 mg/dL (ref 0.61–1.24)
GFR calc Af Amer: >60 mL/min (ref >60)
GFR calc non Af Amer: >60 mL/min (ref >60)
Glucose, Bld: 165 mg/dL — ABNORMAL HIGH (ref 70–99)
Potassium: 3.9 mmol/L (ref 3.5–5.1)
Sodium: 138 mmol/L (ref 135–145)

## 2020-02-24 LAB — GLUCOSE, CAPILLARY
Glucose-Capillary: 159 mg/dL — ABNORMAL HIGH (ref 70–99)
Glucose-Capillary: 127 mg/dL — ABNORMAL HIGH (ref 70–99)
Glucose-Capillary: 253 mg/dL — ABNORMAL HIGH (ref 70–99)
Glucose-Capillary: 97 mg/dL (ref 70–99)

## 2020-02-24 NOTE — Progress Notes (Signed)
PROGRESS NOTE    Chris Bowman  XTG:[id number] DOB: 02-Jan-1961 DOA: 02/01/2020 PCP: Patient, No Pcp Per   Brief Narrative: 59 year old male with Unknown medical history, who has not seen a doctor [id number] presented to the ER on 3/4 with weakness and falls x2 weeks and associated fever dyspnea cough, chronic buttock pain since the fall along with limitation of range of motion, right arm pain. 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 Patient is on Ancef as per ID and planned for 6 weeks of IV antibiotic.  PT/OT recommended skilled nursing facility.   Awaiting Medicaid application, sister working with a Development worker, community, awaiting placement.  Had prolonged hospital course. .  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  Seen by ID, orthopedics, neurosurgery.  TEE negative for endocarditis.  Status post I&D of ulcer on the left great toe.   Neurosurgery recommended 6 weeks to 3 months of antibiotic, repeat MRI C-spine with and without contrast in 6 weeks.  Repeat blood cultures have been negative.  ID was following.  Plan for continue Ancef for 6 weeks course.  He needs follow-up with ID as an outpatient Orthopedics following.  Plan is to remove the sutures early next week and then he can begin weightbearing as tolerated.  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  Case discussed with ENT, recommended antibiotic management, no surgical intervention.  AKI: Likely associated to ATN in the setting of sepsis.  No obstruction ultrasound.  Currently kidney function is stable.  Diabetes mellitus with hyperglycemia: A1c uncontrolled at 11.3.  Not on insulin at home.  Currently on 70/30 insulin.  Diabetic coordinator was following.  Left foot ulcer: Continue wound care.  Status post right TMA  Generalized weakness/frequent falls: PT/OT recommended skilled nursing facility.  Elevated liver enzymes: Likely associated with sepsis, much improved.  Ultrasound showed cholelithiasis.  Microcytic anemia: Currently hemoglobin stable.  Delusional disorder/impaired memory: MRI showed mild cerebral atrophy, chronic small vessel ischemia.  Currently alert, awake and oriented, follows commands.     Nutrition Problem: Increased nutrient needs Etiology: wound healing      DVT prophylaxis:SCD Code Status: Full Family Communication: Called sister on phone for update, call not received Disposition Plan: Patient is from home.  Physical therapy/Occupational Therapy recommended skilled nursing facility.  Patient is medically stable for discharge.  Awaiting Medicaid application, sister working with a Development worker, community, awaiting placement.  Consultants: ID, orthopedics, neurosurgery  Procedures: I&D, PICC line placement,TMA right  Antimicrobials:  Anti-infectives (From admission, onward)   Start  Dose/Rate Route Frequency Ordered Stop   02/03/20 1130  ceFAZolin (ANCEF) IVPB 1 g/50 mL premix  Status:  Discontinued     1 g 100 mL/hr over 30 Minutes Intravenous Every 6 hours 02/03/20 1124 02/03/20 1139   02/02/20 1100  vancomycin (VANCOREADY) IVPB 750 mg/150 mL  Status:  Discontinued     750 mg 150 mL/hr over 60 Minutes Intravenous Every 24 hours 02/01/20 1218 02/02/20 0221   02/02/20 0600  ceFAZolin (ANCEF) IVPB 2g/100 mL premix     2 g 200 mL/hr over 30 Minutes Intravenous Every 8 hours 02/02/20 0221 03/16/20 2359   02/01/20 2230  ceFEPIme (MAXIPIME) 2 g in sodium chloride 0.9 % 100 mL IVPB  Status:  Discontinued     2 g 200 mL/hr over 30 Minutes Intravenous Every 12 hours 02/01/20 1218 02/02/20 0221   02/01/20 1200  metroNIDAZOLE (FLAGYL) IVPB 500 mg  Status:  Discontinued     500 mg 100 mL/hr over 60 Minutes Intravenous Every 8 hours 02/01/20 1149 02/02/20 0221   02/01/20 1015  ceFEPIme (MAXIPIME) 2 g in sodium chloride 0.9 % 100 mL IVPB     2 g 200 mL/hr over 30 Minutes Intravenous  Once 02/01/20 1008 02/01/20 1056   02/01/20 1015  vancomycin (VANCOREADY) IVPB 1750 mg/350 mL     1,750 mg 175 mL/hr over 120 Minutes Intravenous  Once 02/01/20 1008 02/01/20 1336      Subjective: Seen and examined at the bedside this morning.  Hemodynamically stable.  Sitting in the chair.  Comfortable.  Complains of some neck discomfort  Objective: Vitals:   02/23/20 1638 02/23/20 2021 02/24/20 0436 02/24/20 0913  BP: (!) 156/84 137/82 (!) 147/87 125/74  Pulse: 95 88 91 91  Resp: 18   (!) 22  Temp: 98.1 F (36.7 C) 98 F (36.7 C) 98.5 F (36.9 C) 98.5 F (36.9 C)  TempSrc: Oral Oral Oral Oral  SpO2: 100% 99% 98% 100%  Weight:      Height:        Intake/Output Summary (Last 24 hours) at 02/24/2020 1208 Last data filed at 02/24/2020 0830 Gross per 24 hour  Intake 2250.54 ml  Output 3525 ml  Net -1274.46 ml   Filed Weights   02/15/20 2113 02/19/20 2109 02/22/20 2107   Weight: 77.1 kg 73 kg 73.5 kg    Examination:  General exam: Comfortable Respiratory system:  no wheezes or crackles  Cardiovascular system: S1 & S2 heard, RRR. No JVD, murmurs, rubs, gallops or clicks. Gastrointestinal system: Abdomen is nondistended, soft and nontender. No organomegaly or masses felt. Normal bowel sounds heard. Central nervous system: Alert and oriented. No focal neurological deficits. Extremities: No edema, no clubbing ,no cyanosis, PICC line in the right arm .  Status post right TMA, right foot covered with dressing skin: No rashes, lesions or ulcers,no icterus ,no pallor   Data Reviewed: I have personally reviewed following labs and imaging studies  CBC: Recent Labs  Lab 02/18/20 0319 02/19/20 0407 02/24/20 0416  WBC 6.7 6.4 7.3  NEUTROABS 3.7 3.5 4.3  HGB 8.6* 8.7* 9.0*  HCT 28.2* 28.5* 29.8*  MCV 73.4* 74.4* 73.2*  PLT 370 364 884   Basic Metabolic Panel: Recent Labs  Lab 02/18/20 0319 02/19/20 0407 02/24/20 0416  NA 137 136 138  K 4.2 3.9 3.9  CL 107 104 104  CO2 _0 GLUCOSE 151* 169* 165*  BUN 21* 20 19  CREATININE 0.90 1.02 0.92  CALCIUM 8.6* 8.6*  9.0   GFR: Estimated Creatinine Clearance: 85.7 mL/min (by C-G formula based on SCr of 0.92 mg/dL). Liver Function Tests: No results for input(s): AST, ALT, ALKPHOS, BILITOT, PROT, ALBUMIN in the last 168 hours. No results for input(s): LIPASE, AMYLASE in the last 168 hours. No results for input(s): AMMONIA in the last 168 hours. Coagulation Profile: No results for input(s): INR, PROTIME in the last 168 hours. Cardiac Enzymes: No results for input(s): CKTOTAL, CKMB, CKMBINDEX, TROPONINI in the last 168 hours. BNP (last 3 results) No results for input(s): PROBNP in the last 8760 hours. HbA1C: No results for input(s): HGBA1C in the last 72 hours. CBG: Recent Labs  Lab 02/23/20 1118 02/23/20 1637 02/23/20 2022 02/24/20 0624 02/24/20 1141  GLUCAP 151* 153* 143* 159* 127*    Lipid Profile: No results for input(s): CHOL, HDL, LDLCALC, TRIG, CHOLHDL, LDLDIRECT in the last 72 hours. Thyroid Function Tests: No results for input(s): TSH, T4TOTAL, FREET4, T3FREE, THYROIDAB in the last 72 hours. Anemia Panel: No results for input(s): VITAMINB12, FOLATE, FERRITIN, TIBC, IRON, RETICCTPCT in the last 72 hours. Sepsis Labs: No results for input(s): PROCALCITON, LATICACIDVEN in the last 168 hours.  No results found for this or any previous visit (from the past 240 hour(s)).       Radiology Studies: No results found.      Scheduled Meds: . Chlorhexidine Gluconate Cloth  6 each Topical Daily  . feeding supplement (GLUCERNA SHAKE)  237 mL Oral TID BM  . feeding supplement (PRO-STAT SUGAR FREE 64)  30 mL Oral BID  . insulin aspart  0-15 Units Subcutaneous TID WC  . insulin aspart  0-5 Units Subcutaneous QHS  . insulin aspart protamine- aspart  8 Units Subcutaneous BID WC  . insulin starter kit- pen needles  1 kit Other Once  . multivitamin with minerals  1 tablet Oral Daily  . tamsulosin  0.4 mg Oral Daily  . vitamin B-12  1,000 mcg Oral Daily   Continuous Infusions: . sodium chloride 50 mL/hr at 02/23/20 2214  .  ceFAZolin (ANCEF) IV 2 g (02/24/20 0609)  . methocarbamol (ROBAXIN) IV       LOS: 23 days    Time spent: 15 mins.More than 50% of that time was spent in counseling and/or coordination of care.      Shelly Coss, MD [facility] P3/27/2021, 12:08 PM

## 2020-02-25 LAB — GLUCOSE, CAPILLARY
Glucose-Capillary: 178 mg/dL — ABNORMAL HIGH (ref 70–99)
Glucose-Capillary: 225 mg/dL — ABNORMAL HIGH (ref 70–99)
Glucose-Capillary: 168 mg/dL — ABNORMAL HIGH (ref 70–99)
Glucose-Capillary: 187 mg/dL — ABNORMAL HIGH (ref 70–99)

## 2020-02-25 MED ORDER — SODIUM CHLORIDE 0.9 % IV SOLN
510.0000 mg | Freq: Once | INTRAVENOUS | Status: AC
  Administered 2020-02-25: 510 mg via INTRAVENOUS
  Filled 2020-02-25: qty 17

## 2020-02-25 MED ORDER — PANTOPRAZOLE SODIUM 20 MG PO TBEC
20.0000 mg | DELAYED_RELEASE_TABLET | Freq: Every day | ORAL | Status: DC
  Administered 2020-02-25 – 2020-03-01 (×6): 20 mg via ORAL
  Filled 2020-02-25 (×6): qty 1

## 2020-02-25 MED ORDER — FERROUS SULFATE 325 (65 FE) MG PO TABS
325.0000 mg | ORAL_TABLET | Freq: Every day | ORAL | Status: DC
  Administered 2020-02-26 – 2020-03-01 (×5): 325 mg via ORAL
  Filled 2020-02-25 (×5): qty 1

## 2020-02-25 NOTE — Progress Notes (Addendum)
PROGRESS NOTE    Chris Bowman  ZOX:[id number] DOB: [birth year] DOA: 02/01/2020 PCP: Patient, No Pcp Per   Brief Narrative: 59 year old male with Unknown medical history, who has not seen a doctor [id number] presented to the ER on 3/4 with weakness and falls x2 weeks and associated fever dyspnea cough, chronic buttock pain since the fall along with limitation of range of motion, right arm pain. 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 Patient is on Ancef as per ID and planned for 6 weeks of IV antibiotic.  PT/OT recommended skilled nursing facility.   Awaiting Medicaid application, sister working with a Development worker, community, awaiting placement.  Had prolonged hospital course. .  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  Seen by ID, orthopedics, neurosurgery.  TEE negative for endocarditis.  Status post I&D of ulcer on the left great toe.   Neurosurgery recommended 6 weeks to 3 months of antibiotic, repeat MRI C-spine with and without contrast in 6 weeks.  Repeat blood cultures have been negative.  ID was following.  Plan for continue Ancef for 6 weeks course.  He needs follow-up with ID as an outpatient Orthopedics following.  Plan is to remove the sutures early next week and then he can begin weightbearing as tolerated.  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  Case discussed with ENT, recommended antibiotic management, no surgical intervention.  AKI: Likely associated to ATN in the setting of sepsis.  No obstruction ultrasound.  Currently kidney function is stable.  Diabetes mellitus with hyperglycemia: A1c uncontrolled at 11.3.  Not on insulin at home.  Currently on 70/30 insulin.  Diabetic coordinator was following.  Left foot ulcer: Continue wound care.  Status post right TMA  Generalized weakness/frequent falls: PT/OT recommended skilled nursing facility.  Elevated liver enzymes: Likely associated with sepsis, much improved.  Ultrasound showed cholelithiasis.  Microcytic anemia: Currently hemoglobin stable.  Iron panel shows severe iron deficiency.  We will give him a dose of IV iron.start on iron supplement also  Delusional disorder/impaired memory: MRI showed mild cerebral atrophy, chronic small vessel ischemia.  Currently alert, awake and oriented, follows commands.     Nutrition Problem: Increased nutrient needs Etiology: wound healing      DVT prophylaxis:SCD Code Status: Full Family Communication: Called sister on phone for update on 02/25/20, call not received Disposition Plan: Patient is from home.  Physical therapy/Occupational Therapy recommended skilled nursing facility.  Patient is medically stable for discharge.  Awaiting Medicaid application, sister working with a Development worker, community, awaiting placement.  Consultants: ID, orthopedics,  neurosurgery  Procedures: I&D, PICC line placement,TMA right  Antimicrobials:  Anti-infectives (From admission, onward)   Start     Dose/Rate Route Frequency Ordered Stop   02/03/20 1130  ceFAZolin (ANCEF) IVPB 1 g/50 mL premix  Status:  Discontinued     1 g 100 mL/hr over 30 Minutes Intravenous Every 6 hours 02/03/20 1124 02/03/20 1139   02/02/20 1100  vancomycin (VANCOREADY) IVPB 750 mg/150 mL  Status:  Discontinued     750 mg 150 mL/hr over 60 Minutes Intravenous Every 24 hours 02/01/20 1218 02/02/20 0221   02/02/20 0600  ceFAZolin (ANCEF) IVPB 2g/100 mL premix     2 g 200 mL/hr over 30 Minutes Intravenous Every 8 hours 02/02/20 0221 03/16/20 2359   02/01/20 2230  ceFEPIme (MAXIPIME) 2 g in sodium chloride 0.9 % 100 mL IVPB  Status:  Discontinued     2 g 200 mL/hr over 30 Minutes Intravenous Every 12 hours 02/01/20 1218 02/02/20 0221   02/01/20 1200  metroNIDAZOLE (FLAGYL) IVPB 500 mg  Status:  Discontinued     500 mg 100 mL/hr over 60 Minutes Intravenous Every 8 hours 02/01/20 1149 02/02/20 0221   02/01/20 1015  ceFEPIme (MAXIPIME) 2 g in sodium chloride 0.9 % 100 mL IVPB     2 g 200 mL/hr over 30 Minutes Intravenous  Once 02/01/20 1008 02/01/20 1056   02/01/20 1015  vancomycin (VANCOREADY) IVPB 1750 mg/350 mL     1,750 mg 175 mL/hr over 120 Minutes Intravenous  Once 02/01/20 1008 02/01/20 1336      Subjective: Patient seen and examined the bedside this morning.  Hemodynamically stable.  Complains of some dry cough intermittently but fine for now.  Objective: Vitals:   02/24/20 1628 02/24/20 2202 02/25/20 0615 02/25/20 0844  BP: (!) 150/84 125/73 (!) 165/92 134/78  Pulse: 99 89 93 (!) 101  Resp: 20 20 16 18  Temp: 98.6 F (37 C) 98.2 F (36.8 C) 98.1 F (36.7 C) 98.3 F (36.8 C)  TempSrc: Oral Oral Oral Oral  SpO2: 100% 96% 100% 99%  Weight:      Height:        Intake/Output Summary (Last 24 hours) at 02/25/2020 1154 Last data filed at 02/25/2020 1139 Gross per  24 hour  Intake 1830.02 ml  Output 3700 ml  Net -1869.98 ml   Filed Weights   02/15/20 2113 02/19/20 2109 02/22/20 2107  Weight: 77.1 kg 73 kg 73.5 kg    Examination:  General exam: Confortable Respiratory system:no wheezes or crackles  Cardiovascular system: S1 & S2 heard, RRR. No JVD, murmurs, rubs, gallops or clicks. Gastrointestinal system: Abdomen is nondistended, soft and nontender. No organomegaly or masses felt. Normal bowel sounds heard. Central nervous system: Alert and oriented. No focal neurological deficits. Extremities: No edema, no clubbing ,no cyanosis, PICC line on the right arm, status post TMA, right foot covered with dressing  skin: No rashes, lesions or ulcers,no icterus ,no pallor   Data Reviewed: I have personally reviewed following labs and imaging studies  CBC: Recent Labs  Lab 02/19/20 0407 02/24/20 0416  WBC 6.4 7.3  NEUTROABS 3.5 4.3  HGB 8.7* 9.0*  HCT 28.5* 29.8*  MCV 74.4* 73.2*  PLT 364 307   Basic Metabolic Panel: Recent Labs  Lab 02/19/20 0407 02/24/20 0416  NA 136 138  K 3.9 3.9  CL 104 104  CO2 22 24  GLUCOSE 169* 165*  BUN 20 19  CREATININE 1.02 0.92    CALCIUM 8.6* 9.0   GFR: Estimated Creatinine Clearance: 85.7 mL/min (by C-G formula based on SCr of 0.92 mg/dL). Liver Function Tests: No results for input(s): AST, ALT, ALKPHOS, BILITOT, PROT, ALBUMIN in the last 168 hours. No results for input(s): LIPASE, AMYLASE in the last 168 hours. No results for input(s): AMMONIA in the last 168 hours. Coagulation Profile: No results for input(s): INR, PROTIME in the last 168 hours. Cardiac Enzymes: No results for input(s): CKTOTAL, CKMB, CKMBINDEX, TROPONINI in the last 168 hours. BNP (last 3 results) No results for input(s): PROBNP in the last 8760 hours. HbA1C: No results for input(s): HGBA1C in the last 72 hours. CBG: Recent Labs  Lab 02/24/20 1141 02/24/20 1631 02/24/20 2159 02/25/20 0715 02/25/20 1055  GLUCAP 127*  253* 97 178* 225*   Lipid Profile: No results for input(s): CHOL, HDL, LDLCALC, TRIG, CHOLHDL, LDLDIRECT in the last 72 hours. Thyroid Function Tests: No results for input(s): TSH, T4TOTAL, FREET4, T3FREE, THYROIDAB in the last 72 hours. Anemia Panel: No results for input(s): VITAMINB12, FOLATE, FERRITIN, TIBC, IRON, RETICCTPCT in the last 72 hours. Sepsis Labs: No results for input(s): PROCALCITON, LATICACIDVEN in the last 168 hours.  No results found for this or any previous visit (from the past 240 hour(s)).       Radiology Studies: No results found.      Scheduled Meds: . Chlorhexidine Gluconate Cloth  6 each Topical Daily  . feeding supplement (GLUCERNA SHAKE)  237 mL Oral TID BM  . feeding supplement (PRO-STAT SUGAR FREE 64)  30 mL Oral BID  . insulin aspart  0-15 Units Subcutaneous TID WC  . insulin aspart  0-5 Units Subcutaneous QHS  . insulin aspart protamine- aspart  8 Units Subcutaneous BID WC  . insulin starter kit- pen needles  1 kit Other Once  . multivitamin with minerals  1 tablet Oral Daily  . tamsulosin  0.4 mg Oral Daily  . vitamin B-12  1,000 mcg Oral Daily   Continuous Infusions: . sodium chloride Stopped (02/24/20 2156)  .  ceFAZolin (ANCEF) IV 200 mL/hr at 02/25/20 0600  . methocarbamol (ROBAXIN) IV       LOS: 24 days    Time spent: 15 mins.More than 50% of that time was spent in counseling and/or coordination of care.      Amrit Adhikari, MD [facility]/28/2021, 11:54 AM 

## 2020-02-26 LAB — GLUCOSE, CAPILLARY
Glucose-Capillary: 164 mg/dL — ABNORMAL HIGH (ref 70–99)
Glucose-Capillary: 138 mg/dL — ABNORMAL HIGH (ref 70–99)
Glucose-Capillary: 142 mg/dL — ABNORMAL HIGH (ref 70–99)
Glucose-Capillary: 111 mg/dL — ABNORMAL HIGH (ref 70–99)

## 2020-02-26 NOTE — TOC Progression Note (Signed)
Transition of Care [facility]) - Progression Note    Patient Details  Name: Chris Bowman MRN: [id number] Date of Birth: May 24, 1961  Transition of Care [facility]) CM/SW Contact  Okey Dupre Lazaro Arms, LCSW Phone Number: 02/26/2020, 1:44 PM  Clinical Narrative:   The search for a [facility] contracted facility for patient continues. The following facilities were contacted today:  1. [facility]: Per Whitney No beds today. 2. [facility]: [phone]) Talked with Wilkie Aye and right now that don't have any rehab beds, however patient's information can be sent and she will hold on to it. Fax #[phone] - clinicals faxed.  3. [facility]: [phone]) Admissions director Ulyses Southward out today. Left message regarding patient and need for a rehab bed.     4. [facility] [phone]) Talked with Seward Grater in admissions and there is a long wait-list. CSW can check back next week.  5. [facility] [phone]) Talked with admissions director Rontelia and patient's information can be faxed to facility. Clinicals faxed - [phone]. 6. [facility] [phone]) Left message for admissions director Karsten Fells regarding patient and the need for a rehab bed.   CSW will follow-up with [facility] and [facility] Rolene Arbour.      Expected Discharge Plan: Skilled Nursing Facility Barriers to Discharge: Inadequate or no insurance, SNF Pending bed offer, Barriers Unresolved (comment)(02/20/20 - Patient and sister working on [place] paperwork)  Expected Discharge Plan and Services Expected Discharge Plan: Skilled Nursing Facility In-house Referral: Clinical Social Work     Living arrangements for the past 2 months: Apartment                                       Social Determinants of Health (SDOH) Interventions    Readmission Risk Interventions No flowsheet data found.

## 2020-02-26 NOTE — Progress Notes (Signed)
PROGRESS NOTE    Lynette Simmerman  MRN:[id number] DOB: 02/12/1961 DOA: 02/01/2020 PCP: Patient, No Pcp Per   Brief Narrative: 59-year old male with Unknown medical history, who has not seen a doctor since2018 presented to the ER on 3/4 with weakness and falls x2 weeks and associated fever dyspnea cough, chronic buttock pain since the fall along with limitation of range of motion, right arm pain. 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 Patient is on Ancef as per ID and planned for 6 weeks of IV antibiotic.  PT/OT recommended skilled nursing facility.   Awaiting Medicaid application, sister working with a financial counselor, awaiting placement.  Had prolonged hospital course. .  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  Seen by ID, orthopedics, neurosurgery.  TEE negative for endocarditis.  Status post I&D of ulcer on the left great toe.   Neurosurgery recommended 6 weeks to 3 months of antibiotic, repeat MRI C-spine with and without contrast in 6 weeks.  Repeat blood cultures have been negative.  ID was following.  Plan for continue Ancef for 6 weeks course.  He needs follow-up with ID as an outpatient Orthopedics following.  Plan is to remove the sutures early next week and then he can begin weightbearing as tolerated.  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  Case discussed with ENT, recommended antibiotic management, no surgical intervention.  AKI: Likely associated to ATN in the setting of sepsis.  No obstruction ultrasound.  Currently kidney function is stable.  Diabetes mellitus with hyperglycemia: A1c uncontrolled at 11.3.  Not on insulin at home.  Currently on 70/30 insulin.  Diabetic coordinator was following.  Left foot ulcer: Continue wound care.  Status post right TMA  Generalized weakness/frequent falls: PT/OT recommended skilled nursing facility.  Elevated liver enzymes: Likely associated with sepsis, much improved.  Ultrasound showed cholelithiasis.  Microcytic anemia: Currently hemoglobin stable.  Iron panel shows severe iron deficiency.  We will give him a dose of IV iron.start on iron supplement also  Delusional disorder/impaired memory: MRI showed mild cerebral atrophy, chronic small vessel ischemia.  Currently alert, awake and oriented, follows commands.     Nutrition Problem: Increased nutrient needs Etiology: wound healing      DVT prophylaxis:SCD Code Status: Full Family Communication: Called sister on phone for update on 02/25/20, call not received Disposition Plan: Patient is from home.  Physical therapy/Occupational Therapy recommended skilled nursing facility.  Patient is medically stable for discharge.  Awaiting Medicaid application, sister working with a financial counselor, awaiting placement.    Consultants: ID, orthopedics,  neurosurgery  Procedures: I&D, PICC line placement,TMA right  Antimicrobials:  Anti-infectives (From admission, onward)   Start     Dose/Rate Route Frequency Ordered Stop   02/03/20 1130  ceFAZolin (ANCEF) IVPB 1 g/50 mL premix  Status:  Discontinued     1 g 100 mL/hr over 30 Minutes Intravenous Every 6 hours 02/03/20 1124 02/03/20 1139   02/02/20 1100  vancomycin (VANCOREADY) IVPB 750 mg/150 mL  Status:  Discontinued     750 mg 150 mL/hr over 60 Minutes Intravenous Every 24 hours 02/01/20 1218 02/02/20 0221   02/02/20 0600  ceFAZolin (ANCEF) IVPB 2g/100 mL premix     2 g 200 mL/hr over 30 Minutes Intravenous Every 8 hours 02/02/20 0221 03/16/20 2359   02/01/20 2230  ceFEPIme (MAXIPIME) 2 g in sodium chloride 0.9 % 100 mL IVPB  Status:  Discontinued     2 g 200 mL/hr over 30 Minutes Intravenous Every 12 hours 02/01/20 1218 02/02/20 0221   02/01/20 1200  metroNIDAZOLE (FLAGYL) IVPB 500 mg  Status:  Discontinued     500 mg 100 mL/hr over 60 Minutes Intravenous Every 8 hours 02/01/20 1149 02/02/20 0221   02/01/20 1015  ceFEPIme (MAXIPIME) 2 g in sodium chloride 0.9 % 100 mL IVPB     2 g 200 mL/hr over 30 Minutes Intravenous  Once 02/01/20 1008 02/01/20 1056   02/01/20 1015  vancomycin (VANCOREADY) IVPB 1750 mg/350 mL     1,750 mg 175 mL/hr over 120 Minutes Intravenous  Once 02/01/20 1008 02/01/20 1336      Subjective: Patient seen and examined at the bedside this morning.  Sitting at the bedside.  Very comfortable.Denies any new complaints.  Objective: Vitals:   02/25/20 1642 02/25/20 2051 02/26/20 0522 02/26/20 1001  BP: (!) 146/79 128/75 (!) 155/86 119/74  Pulse: 92 98 90 92  Resp: '18 18 20 18  '$ Temp: 98.4 F (36.9 C) 98 F (36.7 C) 98 F (36.7 C) 98.4 F (36.9 C)  TempSrc: Oral Oral Oral Oral  SpO2: 96% 96% 96% 100%  Weight:      Height:        Intake/Output Summary (Last 24 hours) at 02/26/2020 1310 Last data filed at 02/26/2020 0900 Gross per 24 hour  Intake  1155.93 ml  Output 2600 ml  Net -1444.07 ml   Filed Weights   02/15/20 2113 02/19/20 2109 02/22/20 2107  Weight: 77.1 kg 73 kg 73.5 kg    Examination:    General exam: Comfortable Respiratory system:no wheezes or crackles  Cardiovascular system: S1 & S2 heard, RRR. Gastrointestinal system: Abdomen is nondistended, soft and nontender.  Central nervous system: Alert and oriented. No focal neurological deficits. Extremities: No edema, no clubbing ,no cyanosis, PICC line on the right arm, status post TMA, right foot covered with dressing     Data Reviewed: I have personally reviewed following labs and imaging studies  CBC: Recent Labs  Lab 02/24/20 0416  WBC 7.3  NEUTROABS 4.3  HGB 9.0*  HCT 29.8*  MCV 73.2*  PLT 771   Basic Metabolic Panel: Recent Labs  Lab 02/24/20 0416  NA 138  K 3.9  CL 104  CO2 24  GLUCOSE 165*  BUN 19  CREATININE 0.92  CALCIUM 9.0   GFR: Estimated Creatinine Clearance: 85.7 mL/min (by C-G formula based on SCr of 0.92 mg/dL). Liver Function Tests: No results for input(s): AST, ALT, ALKPHOS, BILITOT, PROT, ALBUMIN in the last 168 hours. No results for input(s):  LIPASE, AMYLASE in the last 168 hours. No results for input(s): AMMONIA in the last 168 hours. Coagulation Profile: No results for input(s): INR, PROTIME in the last 168 hours. Cardiac Enzymes: No results for input(s): CKTOTAL, CKMB, CKMBINDEX, TROPONINI in the last 168 hours. BNP (last 3 results) No results for input(s): PROBNP in the last 8760 hours. HbA1C: No results for input(s): HGBA1C in the last 72 hours. CBG: Recent Labs  Lab 02/25/20 1055 02/25/20 1640 02/25/20 2114 02/26/20 0651 02/26/20 1146  GLUCAP 225* 168* 187* 164* 138*   Lipid Profile: No results for input(s): CHOL, HDL, LDLCALC, TRIG, CHOLHDL, LDLDIRECT in the last 72 hours. Thyroid Function Tests: No results for input(s): TSH, T4TOTAL, FREET4, T3FREE, THYROIDAB in the last 72 hours. Anemia Panel: No  results for input(s): VITAMINB12, FOLATE, FERRITIN, TIBC, IRON, RETICCTPCT in the last 72 hours. Sepsis Labs: No results for input(s): PROCALCITON, LATICACIDVEN in the last 168 hours.  No results found for this or any previous visit (from the past 240 hour(s)).       Radiology Studies: No results found.      Scheduled Meds: . Chlorhexidine Gluconate Cloth  6 each Topical Daily  . feeding supplement (GLUCERNA SHAKE)  237 mL Oral TID BM  . feeding supplement (PRO-STAT SUGAR FREE 64)  30 mL Oral BID  . ferrous sulfate  325 mg Oral Q breakfast  . insulin aspart  0-15 Units Subcutaneous TID WC  . insulin aspart  0-5 Units Subcutaneous QHS  . insulin aspart protamine- aspart  8 Units Subcutaneous BID WC  . insulin starter kit- pen needles  1 kit Other Once  . multivitamin with minerals  1 tablet Oral Daily  . pantoprazole  20 mg Oral Daily  . tamsulosin  0.4 mg Oral Daily  . vitamin B-12  1,000 mcg Oral Daily   Continuous Infusions: . sodium chloride Stopped (02/24/20 2156)  .  ceFAZolin (ANCEF) IV 2 g (02/26/20 0600)  . methocarbamol (ROBAXIN) IV       LOS: 25 days    Time spent: 15 mins.More than 50% of that time was spent in counseling and/or coordination of care.      Shelly Coss, MD [facility]/29/2021, 1:10 PM

## 2020-02-27 LAB — GLUCOSE, CAPILLARY
Glucose-Capillary: 164 mg/dL — ABNORMAL HIGH (ref 70–99)
Glucose-Capillary: 149 mg/dL — ABNORMAL HIGH (ref 70–99)
Glucose-Capillary: 115 mg/dL — ABNORMAL HIGH (ref 70–99)
Glucose-Capillary: 147 mg/dL — ABNORMAL HIGH (ref 70–99)

## 2020-02-27 NOTE — Progress Notes (Signed)
Physical Therapy Treatment Patient Details Name: Chris Bowman MRN: [id number] DOB: July 18, 1961 Today's Date: 02/27/2020    History of Present Illness 59 y.o. male was admitted with bacteremia and received TEE to rule out endocarditis causing sepsis.  His R foot received transmet amp on 02/03/20 and L great toe wound I and D. PMHx:  uncontrolled diabetes, essential HTN, delusional disorders. bilateral feet wounds with early indication of osteomyelitis per MRI results from 3/4    PT Comments    Pt motivated to participate in PT this session, and ambulated with use of RW and good knowledge and application of RLE WB precautions. Pt fatigues quickly, and requires rest breaks during gait in order to recover. Plan is for removal of RLE sutures today, and pt will be WBAT tomorrow. PT to continue to follow acutely.    Follow Up Recommendations  SNF     Equipment Recommendations  Wheelchair cushion (measurements PT);Wheelchair (measurements PT);3in1 (PT)    Recommendations for Other Services       Precautions / Restrictions Precautions Precautions: Fall Precaution Comments: R foot ray amputation - NWB Other Brace: R post-op shoe Restrictions Weight Bearing Restrictions: Yes RLE Weight Bearing: Non weight bearing LLE Weight Bearing: Weight bearing as tolerated    Mobility  Bed Mobility               General bed mobility comments: pt up in chair upon PT arrival to room, requests stay in chair upon PT exit.  Transfers Overall transfer level: Needs assistance Equipment used: Rolling walker (2 wheeled) Transfers: Sit to/from Stand Sit to Stand: Min guard         General transfer comment: min gaurd for safety, verbal cuing for hand placement when rising and sitting. Pt with tendency for TDWB through RLE during transfer but physically shifts weight to L to avoid WB  Ambulation/Gait Ambulation/Gait assistance: Min guard Gait Distance (Feet): [phone]) Assistive device:  Rolling walker (2 wheeled) Gait Pattern/deviations: Trunk flexed;Step-to pattern;Narrow base of support Gait velocity: decr   General Gait Details: min guard for safety, verbal cuing for placement in RW, room and hallway navigation. Pt with seated rest break x1 for ~2 minutes to recover fatigue, ambulated 15+20 ft with significant fatigue in UEs and LLE at end of gait.   Stairs             Wheelchair Mobility    Modified Rankin (Stroke Patients Only)       Balance Overall balance assessment: Needs assistance   Sitting balance-Leahy Scale: Good Sitting balance - Comments: no LOB during LB dressing, requires min assist for L shoe and placement of R post-op shoe   Standing balance support: During functional activity;Single extremity supported Standing balance-Leahy Scale: Poor Standing balance comment: reliant on external support                            Cognition Arousal/Alertness: Awake/alert Behavior During Therapy: WFL for tasks assessed/performed Overall Cognitive Status: Within Functional Limits for tasks assessed                                        Exercises General Exercises - Lower Extremity Ankle Circles/Pumps: AROM;10 reps;Left(DF to neutral without significant gastroc tightness)    General Comments        Pertinent Vitals/Pain Pain Assessment: No/denies pain Pain Intervention(s): Limited activity within  patient's tolerance;Monitored during session    Home Living                      Prior Function            PT Goals (current goals can now be found in the care plan section) Acute Rehab PT Goals Patient Stated Goal: to get home PT Goal Formulation: With patient Time For Goal Achievement: 03/01/20 Potential to Achieve Goals: Good Progress towards PT goals: Progressing toward goals    Frequency    Min 2X/week      PT Plan Current plan remains appropriate    Co-evaluation               AM-PAC PT "6 Clicks" Mobility   Outcome Measure  Help needed turning from your back to your side while in a flat bed without using bedrails?: None Help needed moving from lying on your back to sitting on the side of a flat bed without using bedrails?: None Help needed moving to and from a bed to a chair (including a wheelchair)?: A Little Help needed standing up from a chair using your arms (e.g., wheelchair or bedside chair)?: A Little Help needed to walk in hospital room?: A Little Help needed climbing 3-5 steps with a railing? : Total 6 Click Score: 18    End of Session Equipment Utilized During Treatment: Gait belt Activity Tolerance: Patient tolerated treatment well Patient left: with call bell/phone within reach;in bed;with bed alarm set Nurse Communication: Mobility status PT Visit Diagnosis: Unsteadiness on feet (R26.81);Difficulty in walking, not elsewhere classified (R26.2);Repeated falls (R29.6)     Time: 1610-9604 PT Time Calculation (min) (ACUTE ONLY): 24 min  Charges:  $Gait Training: 8-22 mins $Therapeutic Activity: 8-22 mins                     Taimi Towe E, PT Acute Rehabilitation Services Pager [phone]  Office [place] 02/27/2020, 1:55 PM

## 2020-02-27 NOTE — Progress Notes (Signed)
Doing well TMA foot wound  healed well approximated wound edges , no necrosis or cellulitis.No drainage. Minimal to no swelling. Will have sutures removed today

## 2020-02-27 NOTE — Progress Notes (Signed)
PROGRESS NOTE    Chris Bowman  OTL:[id number] DOB: December 25, 1960 DOA: 02/01/2020 PCP: Patient, No Pcp Per   Brief Narrative: 59 year old male with Unknown medical history, who has not seen a doctor [id number] presented to the ER on 3/4 with weakness and falls x2 weeks and associated fever dyspnea cough, chronic buttock pain since the fall along with limitation of range of motion, right arm pain. 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 Patient is on Ancef as per ID and planned for 6 weeks of IV antibiotic.  PT/OT recommended skilled nursing facility.   Awaiting Medicaid application, sister working with a Development worker, community, awaiting placement.  Had prolonged hospital course. .  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  Seen by ID, orthopedics, neurosurgery.  TEE negative for endocarditis.  Status post I&D of ulcer on the left great toe.   Neurosurgery recommended 6 weeks to 3 months of antibiotic, repeat MRI C-spine with and without contrast in 6 weeks.  Repeat blood cultures have been negative.  ID was following.  Plan for continue Ancef for 6 weeks course.  He needs follow-up with ID as an outpatient Orthopedics following.  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  Case discussed with ENT, recommended antibiotic management, no surgical intervention.  AKI: Likely associated to ATN in the setting of sepsis.  No obstruction ultrasound.  Currently kidney function is stable.  Diabetes mellitus with hyperglycemia: A1c uncontrolled at 11.3.  Not on insulin at home.  Currently on 70/30 insulin.  Diabetic coordinator was following.  Left foot ulcer: Continue wound care.  Status post right TMA  Generalized weakness/frequent falls: PT/OT recommended skilled nursing facility.  Elevated liver enzymes: Likely associated with sepsis, much improved.  Ultrasound showed cholelithiasis.  Microcytic anemia: Currently hemoglobin stable.  Iron panel shows severe iron deficiency.  We will give him a dose of IV iron.start on iron supplement also  Delusional disorder/impaired memory: MRI showed mild cerebral atrophy, chronic small vessel ischemia.  Currently alert, awake and oriented, follows commands.     Nutrition Problem: Increased nutrient needs Etiology: wound healing      DVT prophylaxis:SCD Code Status: Full Family Communication: Called sister on phone for update on 02/25/20, call not received Disposition Plan: Patient is from home.  Physical therapy/Occupational Therapy recommended skilled nursing facility.  Patient is medically stable for discharge.  Awaiting Medicaid application, sister working with a Development worker, community, awaiting placement.  Consultants: ID, orthopedics, neurosurgery  Procedures: I&D, PICC line placement,TMA  right  Antimicrobials:  Anti-infectives (From admission, onward)   Start     Dose/Rate Route Frequency Ordered Stop   02/03/20 1130  ceFAZolin (ANCEF) IVPB 1 g/50 mL premix  Status:  Discontinued     1 g 100 mL/hr over 30 Minutes Intravenous Every 6 hours 02/03/20 1124 02/03/20 1139   02/02/20 1100  vancomycin (VANCOREADY) IVPB 750 mg/150 mL  Status:  Discontinued     750 mg 150 mL/hr over 60 Minutes Intravenous Every 24 hours 02/01/20 1218 02/02/20 0221   02/02/20 0600  ceFAZolin (ANCEF) IVPB 2g/100 mL premix     2 g 200 mL/hr over 30 Minutes Intravenous Every 8 hours 02/02/20 0221 03/16/20 2359   02/01/20 2230  ceFEPIme (MAXIPIME) 2 g in sodium chloride 0.9 % 100 mL IVPB  Status:  Discontinued     2 g 200 mL/hr over 30 Minutes Intravenous Every 12 hours 02/01/20 1218 02/02/20 0221   02/01/20 1200  metroNIDAZOLE (FLAGYL) IVPB 500 mg  Status:  Discontinued     500 mg 100 mL/hr over 60 Minutes Intravenous Every 8 hours 02/01/20 1149 02/02/20 0221   02/01/20 1015  ceFEPIme (MAXIPIME) 2 g in sodium chloride 0.9 % 100 mL IVPB     2 g 200 mL/hr over 30 Minutes Intravenous  Once 02/01/20 1008 02/01/20 1056   02/01/20 1015  vancomycin (VANCOREADY) IVPB 1750 mg/350 mL     1,750 mg 175 mL/hr over 120 Minutes Intravenous  Once 02/01/20 1008 02/01/20 1336      Subjective: Patient seen and examined the bedside this morning.  Hemodynamically stable.  Denies any complaints today.  Comfortable  Objective: Vitals:   02/26/20 1001 02/26/20 1655 02/26/20 2128 02/27/20 0402  BP: 119/74 (!) 141/78 125/71 (!) 152/90  Pulse: 92 91 87 95  Resp: 18 16 16 18   Temp: 98.4 F (36.9 C) 98.6 F (37 C) 98.3 F (36.8 C) 97.6 F (36.4 C)  TempSrc: Oral Oral Oral Oral  SpO2: 100% 100% 96% 98%  Weight:   73.5 kg   Height:        Intake/Output Summary (Last 24 hours) at 02/27/2020 0754 Last data filed at 02/27/2020 9373 Gross per 24 hour  Intake 1326 ml  Output 1650 ml  Net -324 ml   Filed Weights    02/19/20 2109 02/22/20 2107 02/26/20 2128  Weight: 73 kg 73.5 kg 73.5 kg    Examination:  General exam: Comfortable Respiratory system: no wheezes or crackles  Cardiovascular system: S1 & S2 heard, RRR.  Gastrointestinal system: Abdomen is nondistended, soft and nontender. No organomegaly or masses felt. Normal bowel sounds heard. Central nervous system: Alert and oriented.  Extremities: No edema, no clubbing ,no cyanosis, PICC line on the right arm, status post TMA, right foot covered with dressing    Data Reviewed: I have personally reviewed following labs and imaging studies  CBC: Recent Labs  Lab 02/24/20 0416  WBC 7.3  NEUTROABS 4.3  HGB 9.0*  HCT 29.8*  MCV 73.2*  PLT 428   Basic Metabolic Panel: Recent Labs  Lab 02/24/20 0416  NA 138  K 3.9  CL 104  CO2 24  GLUCOSE 165*  BUN 19  CREATININE 0.92  CALCIUM 9.0   GFR: Estimated Creatinine Clearance: 85.7 mL/min (by C-G formula based on SCr of 0.92 mg/dL). Liver Function Tests: No results for input(s): AST, ALT, ALKPHOS, BILITOT, PROT, ALBUMIN in the last 168 hours. No results for input(s): LIPASE, AMYLASE in the last 168 hours. No  results for input(s): AMMONIA in the last 168 hours. Coagulation Profile: No results for input(s): INR, PROTIME in the last 168 hours. Cardiac Enzymes: No results for input(s): CKTOTAL, CKMB, CKMBINDEX, TROPONINI in the last 168 hours. BNP (last 3 results) No results for input(s): PROBNP in the last 8760 hours. HbA1C: No results for input(s): HGBA1C in the last 72 hours. CBG: Recent Labs  Lab 02/26/20 0651 02/26/20 1146 02/26/20 1656 02/26/20 2130 02/27/20 0643  GLUCAP 164* 138* 142* 111* 164*   Lipid Profile: No results for input(s): CHOL, HDL, LDLCALC, TRIG, CHOLHDL, LDLDIRECT in the last 72 hours. Thyroid Function Tests: No results for input(s): TSH, T4TOTAL, FREET4, T3FREE, THYROIDAB in the last 72 hours. Anemia Panel: No results for input(s): VITAMINB12,  FOLATE, FERRITIN, TIBC, IRON, RETICCTPCT in the last 72 hours. Sepsis Labs: No results for input(s): PROCALCITON, LATICACIDVEN in the last 168 hours.  No results found for this or any previous visit (from the past 240 hour(s)).       Radiology Studies: No results found.      Scheduled Meds: . Chlorhexidine Gluconate Cloth  6 each Topical Daily  . feeding supplement (GLUCERNA SHAKE)  237 mL Oral TID BM  . feeding supplement (PRO-STAT SUGAR FREE 64)  30 mL Oral BID  . ferrous sulfate  325 mg Oral Q breakfast  . insulin aspart  0-15 Units Subcutaneous TID WC  . insulin aspart  0-5 Units Subcutaneous QHS  . insulin aspart protamine- aspart  8 Units Subcutaneous BID WC  . insulin starter kit- pen needles  1 kit Other Once  . multivitamin with minerals  1 tablet Oral Daily  . pantoprazole  20 mg Oral Daily  . tamsulosin  0.4 mg Oral Daily  . vitamin B-12  1,000 mcg Oral Daily   Continuous Infusions: . sodium chloride Stopped (02/24/20 2156)  .  ceFAZolin (ANCEF) IV 2 g (02/27/20 0549)  . methocarbamol (ROBAXIN) IV       LOS: 26 days    Time spent: 15 mins.More than 50% of that time was spent in counseling and/or coordination of care.      Shelly Coss, MD [facility]/30/2021, 7:54 AM

## 2020-02-27 NOTE — Progress Notes (Signed)
Occupational Therapy Treatment Patient Details Name: Chris Bowman MRN: [id number] DOB: 11-18-1961 Today's Date: 02/27/2020    History of present illness 59 y.o. male was admitted with bacteremia and received TEE to rule out endocarditis causing sepsis.  His R foot received transmet amp on 02/03/20 and L great toe wound I and D. PMHx:  uncontrolled diabetes, essential HTN, delusional disorders. bilateral feet wounds with early indication of osteomyelitis per MRI results from 3/4   OT comments  Patient agreeable to OT, reports he is getting stitches out sometime today and will be able to bear weight through R LE starting tomorrow. Patient min guard to min A with functional transfers and mobility for safety and increased fatigue in standing at sink for grooming/hygiene. After brushing his teeth patient request to return to recliner due to fatigue in R UE and LEs. Patient require continued acute OT to maximize activity tolerance and safety during self care. Will continue to follow.   Follow Up Recommendations  SNF;Supervision/Assistance - 24 hour    Equipment Recommendations  3 in 1 bedside commode       Precautions / Restrictions Precautions Precautions: Fall Precaution Comments: R foot ray amputation - NWB Required Braces or Orthoses: Other Brace Other Brace: R post-op shoe Restrictions Weight Bearing Restrictions: Yes RLE Weight Bearing: Non weight bearing LLE Weight Bearing: Weight bearing as tolerated Other Position/Activity Restrictions: ortho shoe R foot       Mobility Bed Mobility               General bed mobility comments: seated in chair upon arrival  Transfers Overall transfer level: Needs assistance Equipment used: Rolling walker (2 wheeled) Transfers: Sit to/from Stand Sit to Stand: Min guard         General transfer comment: min guard for safety, verbal cues for body mechanics    Balance Overall balance assessment: Needs assistance Sitting-balance  support: No upper extremity supported;Feet supported Sitting balance-Leahy Scale: Good    Standing balance support: Bilateral upper extremity supported;During functional activity Standing balance-Leahy Scale: Poor Standing balance comment: reliant on external support                           ADL either performed or assessed with clinical judgement   ADL Overall ADL's : Needs assistance/impaired     Grooming: Oral care;Wash/dry hands;Min guard;Minimal assistance;Standing Grooming Details (indicate cue type and reason): min G to min A for safety due to fatigue with standing             Lower Body Dressing: Set up;Sitting/lateral leans Lower Body Dressing Details (indicate cue type and reason): to don post op shoe and sneaker seated in Forensic psychologist Transfer: Min guard;Ambulation;RW Toilet Transfer Details (indicate cue type and reason): simulated with functional mobility, min guard for safety due to fatigue         Functional mobility during ADLs: Min guard;Minimal assistance;Rolling walker                 Cognition Arousal/Alertness: Awake/alert Behavior During Therapy: WFL for tasks assessed/performed Overall Cognitive Status: Within Functional Limits for tasks assessed                                 General Comments: tangential                   Pertinent Vitals/ Pain  Pain Assessment: Faces Faces Pain Scale: No hurt Pain Intervention(s): Limited activity within patient's tolerance;Monitored during session         Frequency  Min 2X/week        Progress Toward Goals  OT Goals(current goals can now be found in the care plan section)  Progress towards OT goals: Progressing toward goals  Acute Rehab OT Goals Patient Stated Goal: to get home OT Goal Formulation: With patient Time For Goal Achievement: 03/12/20 Potential to Achieve Goals: Good ADL Goals Pt Will Perform Grooming: standing;with supervision Pt  Will Perform Upper Body Bathing: with modified independence;sitting Pt Will Perform Lower Body Bathing: with supervision;sit to/from stand Pt Will Transfer to Toilet: with supervision;ambulating;bedside commode Pt Will Perform Toileting - Clothing Manipulation and hygiene: with supervision  Plan Discharge plan remains appropriate       AM-PAC OT "6 Clicks" Daily Activity     Outcome Measure   Help from another person eating meals?: None Help from another person taking care of personal grooming?: A Little Help from another person toileting, which includes using toliet, bedpan, or urinal?: A Little Help from another person bathing (including washing, rinsing, drying)?: A Little Help from another person to put on and taking off regular upper body clothing?: A Little Help from another person to put on and taking off regular lower body clothing?: A Little 6 Click Score: 19    End of Session Equipment Utilized During Treatment: Rolling walker  OT Visit Diagnosis: Unsteadiness on feet (R26.81);Other abnormalities of gait and mobility (R26.89);History of falling (Z91.81);Muscle weakness (generalized) (M62.81) Pain - Right/Left: Right Pain - part of body: Ankle and joints of foot   Activity Tolerance Patient tolerated treatment well   Patient Left in chair;with call bell/phone within reach;with nursing/sitter in room   Nurse Communication Mobility status        Time: 8101-7510 OT Time Calculation (min): 12 min  Charges: OT General Charges $OT Visit: 1 Visit OT Treatments $Self Care/Home Management : 8-22 mins  Shon Millet OT OT office: [place] 02/27/2020, 2:05 PM

## 2020-02-27 NOTE — Plan of Care (Signed)
?  Problem: Clinical Measurements: ?Goal: Will remain free from infection ?Outcome: Progressing ?  ?

## 2020-02-27 NOTE — Progress Notes (Signed)
Sutures x7 removed from RLE, skin C/D/I, incision w/o redness or drainage, DSD applied w/o difficulty, pt tolerated well.

## 2020-02-28 LAB — GLUCOSE, CAPILLARY
Glucose-Capillary: 197 mg/dL — ABNORMAL HIGH (ref 70–99)
Glucose-Capillary: 166 mg/dL — ABNORMAL HIGH (ref 70–99)
Glucose-Capillary: 139 mg/dL — ABNORMAL HIGH (ref 70–99)
Glucose-Capillary: 172 mg/dL — ABNORMAL HIGH (ref 70–99)

## 2020-02-28 NOTE — Plan of Care (Signed)
?  Problem: Clinical Measurements: ?Goal: Will remain free from infection ?Outcome: Progressing ?  ?

## 2020-02-28 NOTE — TOC Progression Note (Signed)
Transition of Care (TOC) - Progression Note *[facility] contracted SNF search    Patient Details  Name: Malak Orantes MRN: [id number] Date of Birth: 1961/08/19  [facility]) CM/SW Contact  Okey Dupre Lazaro Arms, LCSW Phone Number: 02/28/2020, 1:44 PM  Clinical Narrative:  CSW continuing search for [facility] contracted SNF placement. 1. Call made to [facility] [place] [phone]) and message left for Shanda Bumps; following up to determine if they have any bed availability.  2. Call made to Despina Hidden, Transitional Rehab SW at [place] and message left, following up on 3/26 contact. Received text from [facility] indicating that right now they don't have any beds, but may have a d/c tomorrow (Thursday). She requested and patient's clinicals sent via HUB.      Expected Discharge Plan: Skilled Nursing Facility Barriers to Discharge: Inadequate or no insurance, SNF Pending bed offer, Barriers Unresolved (comment)(02/20/20 - Patient and sister working on [place] paperwork)  Expected Discharge Plan and Services Expected Discharge Plan: Skilled Nursing Facility In-house Referral: Clinical Social Work     Living arrangements for the past 2 months: Apartment                                       Social Determinants of Health (SDOH) Interventions    Readmission Risk Interventions No flowsheet data found.

## 2020-02-28 NOTE — Progress Notes (Signed)
PROGRESS NOTE    Chris Bowman  GNO:[id number] DOB: 1961/03/23 DOA: 02/01/2020 PCP: Patient, No Pcp Per   Brief Narrative: 59 year old male with Unknown medical history, who has not seen a doctor [id number] presented to the ER on 3/4 with weakness and falls x2 weeks and associated fever dyspnea cough, chronic buttock pain since the fall along with limitation of range of motion, right arm pain. 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 Patient is on Ancef as per ID and planned for 6 weeks of IV antibiotic.  PT/OT recommended skilled nursing facility.   Awaiting Medicaid application, sister working with a Development worker, community, awaiting placement.  Had prolonged hospital course. .  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  Seen by ID, orthopedics, neurosurgery.  TEE negative for endocarditis.  Status post I&D of ulcer on the left great toe.   Neurosurgery recommended 6 weeks to 3 months of antibiotic, repeat MRI C-spine with and without contrast in 6 weeks.  Repeat blood cultures have been negative.  ID was following.  Plan for continue Ancef for 6 weeks course .Abx will be continued through 03/16/20.  He needs follow-up with ID as an outpatient Orthopedics following.  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  Case discussed with ENT, recommended antibiotic management, no surgical intervention.  AKI: Likely associated to ATN in the setting of sepsis.  No obstruction ultrasound.  Currently kidney function is stable.  Diabetes mellitus with hyperglycemia: A1c uncontrolled at 11.3.  Not on insulin at home.  Currently on 70/30 insulin.  Diabetic coordinator was following.  Left foot ulcer: Continue wound care.  Status post right TMA.Sutures removed  Generalized weakness/frequent falls: PT/OT recommended skilled nursing facility.  Elevated liver enzymes: No finding of cholecystitis, much improved.  Ultrasound showed cholelithiasis.  Microcytic anemia: Currently hemoglobin stable.  Iron panel shows severe iron deficiency.S/P a dose of IV iron.Started  on iron supplement also  Delusional disorder/impaired memory: MRI showed mild cerebral atrophy, chronic small vessel ischemia.  Currently alert, awake and oriented.    Nutrition Problem: Increased nutrient needs Etiology: wound healing      DVT prophylaxis:SCD Code Status: Full Family Communication: Called sister on phone for update on 02/28/20 Disposition Plan: Patient is from home.  Physical therapy/Occupational Therapy recommended skilled nursing facility.  Patient is medically stable for discharge.  Awaiting Medicaid application, sister working with a Development worker, community, awaiting placement.  Consultants: ID, orthopedics, neurosurgery  Procedures: I&D, PICC line placement,TMA  right  Antimicrobials:  Anti-infectives (From admission, onward)   Start     Dose/Rate Route Frequency Ordered Stop   02/03/20 1130  ceFAZolin (ANCEF) IVPB 1 g/50 mL premix  Status:  Discontinued     1 g 100 mL/hr over 30 Minutes Intravenous Every 6 hours 02/03/20 1124 02/03/20 1139   02/02/20 1100  vancomycin (VANCOREADY) IVPB 750 mg/150 mL  Status:  Discontinued     750 mg 150 mL/hr over 60 Minutes Intravenous Every 24 hours 02/01/20 1218 02/02/20 0221   02/02/20 0600  ceFAZolin (ANCEF) IVPB 2g/100 mL premix     2 g 200 mL/hr over 30 Minutes Intravenous Every 8 hours 02/02/20 0221 03/16/20 2359   02/01/20 2230  ceFEPIme (MAXIPIME) 2 g in sodium chloride 0.9 % 100 mL IVPB  Status:  Discontinued     2 g 200 mL/hr over 30 Minutes Intravenous Every 12 hours 02/01/20 1218 02/02/20 0221   02/01/20 1200  metroNIDAZOLE (FLAGYL) IVPB 500 mg  Status:  Discontinued     500 mg 100 mL/hr over 60 Minutes Intravenous Every 8 hours 02/01/20 1149 02/02/20 0221   02/01/20 1015  ceFEPIme (MAXIPIME) 2 g in sodium chloride 0.9 % 100 mL IVPB     2 g 200 mL/hr over 30 Minutes Intravenous  Once 02/01/20 1008 02/01/20 1056   02/01/20 1015  vancomycin (VANCOREADY) IVPB 1750 mg/350 mL     1,750 mg 175 mL/hr over 120 Minutes Intravenous  Once 02/01/20 1008 02/01/20 1336      Subjective: Patient seen and examined the bedside this morning.  Hemodynamically stable.  Comfortable.  Denies any complaints today. Objective: Vitals:   02/27/20 0900 02/27/20 1307 02/27/20 2116 02/28/20 0538  BP: 129/73 113/67 134/75 134/75  Pulse: 93 89 90 88  Resp: 18 17 18 18   Temp: 98 F (36.7 C) 97.6 F (36.4 C) 97.8 F (36.6 C) 98.4 F (36.9 C)  TempSrc: Oral Oral Oral Oral  SpO2: 100% 100% 94% 98%  Weight:   73.5 kg   Height:        Intake/Output Summary (Last 24 hours) at 02/28/2020 0820 Last data filed at 02/28/2020 5956 Gross per 24 hour  Intake 1560.59 ml  Output 2300 ml  Net -739.41 ml   Filed Weights    02/22/20 2107 02/26/20 2128 02/27/20 2116  Weight: 73.5 kg 73.5 kg 73.5 kg    Examination:  General exam: Comfortable, sitting in the chair  Respiratory system:  no wheezes or crackles  Cardiovascular system: S1 & S2 heard, RRR. No JVD, murmurs, rubs, gallops or clicks. Gastrointestinal system: Abdomen is nondistended, soft and nontender. No organomegaly or masses felt. Normal bowel sounds heard. Central nervous system: Alert and oriented. No focal neurological deficits. Extremities: No edema, no clubbing ,no cyanosis, PICC line on the right arm, status post TMA, right foot covered with dressing  Skin: No rashes, lesions or ulcers  Data Reviewed: I have personally reviewed following labs and imaging studies  CBC: Recent Labs  Lab 02/24/20 0416  WBC 7.3  NEUTROABS 4.3  HGB 9.0*  HCT 29.8*  MCV 73.2*  PLT 387   Basic Metabolic Panel: Recent Labs  Lab 02/24/20 0416  NA 138  K 3.9  CL 104  CO2 24  GLUCOSE 165*  BUN 19  CREATININE 0.92  CALCIUM 9.0   GFR: Estimated Creatinine Clearance: 85.7 mL/min (by C-G formula based on SCr of 0.92 mg/dL). Liver Function Tests: No results for input(s): AST, ALT, ALKPHOS, BILITOT, PROT, ALBUMIN in the  last 168 hours. No results for input(s): LIPASE, AMYLASE in the last 168 hours. No results for input(s): AMMONIA in the last 168 hours. Coagulation Profile: No results for input(s): INR, PROTIME in the last 168 hours. Cardiac Enzymes: No results for input(s): CKTOTAL, CKMB, CKMBINDEX, TROPONINI in the last 168 hours. BNP (last 3 results) No results for input(s): PROBNP in the last 8760 hours. HbA1C: No results for input(s): HGBA1C in the last 72 hours. CBG: Recent Labs  Lab 02/27/20 0643 02/27/20 1121 02/27/20 1624 02/27/20 2116 02/28/20 0638  GLUCAP 164* 149* 115* 147* 197*   Lipid Profile: No results for input(s): CHOL, HDL, LDLCALC, TRIG, CHOLHDL, LDLDIRECT in the last 72 hours. Thyroid Function Tests: No results for  input(s): TSH, T4TOTAL, FREET4, T3FREE, THYROIDAB in the last 72 hours. Anemia Panel: No results for input(s): VITAMINB12, FOLATE, FERRITIN, TIBC, IRON, RETICCTPCT in the last 72 hours. Sepsis Labs: No results for input(s): PROCALCITON, LATICACIDVEN in the last 168 hours.  No results found for this or any previous visit (from the past 240 hour(s)).       Radiology Studies: No results found.      Scheduled Meds: . Chlorhexidine Gluconate Cloth  6 each Topical Daily  . feeding supplement (GLUCERNA SHAKE)  237 mL Oral TID BM  . feeding supplement (PRO-STAT SUGAR FREE 64)  30 mL Oral BID  . ferrous sulfate  325 mg Oral Q breakfast  . insulin aspart  0-15 Units Subcutaneous TID WC  . insulin aspart  0-5 Units Subcutaneous QHS  . insulin aspart protamine- aspart  8 Units Subcutaneous BID WC  . insulin starter kit- pen needles  1 kit Other Once  . multivitamin with minerals  1 tablet Oral Daily  . pantoprazole  20 mg Oral Daily  . tamsulosin  0.4 mg Oral Daily  . vitamin B-12  1,000 mcg Oral Daily   Continuous Infusions: . sodium chloride Stopped (02/24/20 2156)  .  ceFAZolin (ANCEF) IV 2 g (02/28/20 0550)  . methocarbamol (ROBAXIN) IV       LOS: 27 days    Time spent: 15 mins.More than 50% of that time was spent in counseling and/or coordination of care.      Shelly Coss, MD [facility]/31/2021, 8:20 AM

## 2020-02-29 LAB — BASIC METABOLIC PANEL
Anion gap: 10 (ref 5–15)
BUN: 26 mg/dL — ABNORMAL HIGH (ref 6–20)
CO2: 26 mmol/L (ref 22–32)
Calcium: 9 mg/dL (ref 8.9–10.3)
Chloride: 104 mmol/L (ref 98–111)
Creatinine, Ser: 1.08 mg/dL (ref 0.61–1.24)
GFR calc Af Amer: >60 mL/min (ref >60)
GFR calc non Af Amer: >60 mL/min (ref >60)
Glucose, Bld: 161 mg/dL — ABNORMAL HIGH (ref 70–99)
Potassium: 4.1 mmol/L (ref 3.5–5.1)
Sodium: 140 mmol/L (ref 135–145)

## 2020-02-29 LAB — CBC WITH DIFFERENTIAL/PLATELET
Abs Immature Granulocytes: 0.02 10*3/uL (ref 0.00–0.07)
Basophils Absolute: 0.1 10*3/uL (ref 0.0–0.1)
Basophils Relative: 1 %
Eosinophils Absolute: 0.2 10*3/uL (ref 0.0–0.5)
Eosinophils Relative: 2 %
HCT: 29.9 % — ABNORMAL LOW (ref 39.0–52.0)
Hemoglobin: 9.2 g/dL — ABNORMAL LOW (ref 13.0–17.0)
Immature Granulocytes: 0 %
Lymphocytes Relative: 34 %
Lymphs Abs: 2.4 10*3/uL (ref 0.7–4.0)
MCH: 22.8 pg — ABNORMAL LOW (ref 26.0–34.0)
MCHC: 30.8 g/dL (ref 30.0–36.0)
MCV: 74.2 fL — ABNORMAL LOW (ref 80.0–100.0)
Monocytes Absolute: 0.6 10*3/uL (ref 0.1–1.0)
Monocytes Relative: 8 %
Neutro Abs: 3.9 10*3/uL (ref 1.7–7.7)
Neutrophils Relative %: 55 %
Platelets: 274 10*3/uL (ref 150–400)
RBC: 4.03 MIL/uL — ABNORMAL LOW (ref 4.22–5.81)
RDW: 17.4 % — ABNORMAL HIGH (ref 11.5–15.5)
WBC: 7.2 10*3/uL (ref 4.0–10.5)
nRBC: 0 % (ref 0.0–0.2)

## 2020-02-29 LAB — GLUCOSE, CAPILLARY
Glucose-Capillary: 183 mg/dL — ABNORMAL HIGH (ref 70–99)
Glucose-Capillary: 184 mg/dL — ABNORMAL HIGH (ref 70–99)
Glucose-Capillary: 180 mg/dL — ABNORMAL HIGH (ref 70–99)
Glucose-Capillary: 171 mg/dL — ABNORMAL HIGH (ref 70–99)

## 2020-02-29 LAB — SARS CORONAVIRUS 2 (TAT 6-24 HRS): SARS Coronavirus 2: NEGATIVE

## 2020-02-29 NOTE — Plan of Care (Signed)
  Problem: Clinical Measurements: Goal: Will remain free from infection Outcome: Adequate for Discharge   

## 2020-02-29 NOTE — TOC Progression Note (Addendum)
Transition of Care [facility]) - Progression Note    Patient Details  Name: Chris Bowman MRN: [id number] Date of Birth: July 16, 1961  Transition of Care [facility]) CM/SW Contact  Okey Dupre Lazaro Arms, LCSW Phone Number: 02/29/2020, 2:47 PM  Clinical Narrative: CSW followed up with Whitney,transitional Rehab SW at [facility] regarding patient. Per Alphonzo Lemmings, patient's clinicals are being reviewed and she will get back with CSW. Follow-up will be done with facilities that did not have a bed at the time of initial contact.   Talked with Despina Hidden, Transitional Rehab SW at [facility] and they can take patient. She asked if patient has been vaccinated previously for COVID and requested a COVID test. Patient can d/c to their facility on Friday.  CSW talked with patient and learned that he has not had the COVID vaccinations and Whitney updated. MD advised and COVID test requested.    Expected Discharge Plan: Skilled Nursing Facility Barriers to Discharge: No SNF bed(02/28/20: [facility]. None available as of 3/31.)  Expected Discharge Plan and Services Expected Discharge Plan: Skilled Nursing Facility - [facility] on Friday, 03/01/20 In-house Referral: Clinical Social Work     Living arrangements for the past 2 months: Apartment                                       Social Determinants of Health (SDOH) Interventions  No SDOH interventions requested or needed at this time.  Readmission Risk Interventions No flowsheet data found.

## 2020-02-29 NOTE — Progress Notes (Signed)
Initial Nutrition Assessment  DOCUMENTATION CODES:   Not applicable  INTERVENTION:  Glucerna Shake po TID, each supplement provides 220 kcal and 10 grams of protein  30 ml Prostat BID, each supplement provides 100 kcals and 15 grams protein.   Continue MVI with Minerals  NUTRITION DIAGNOSIS:   Increased nutrient needs related to wound healing as evidenced by estimated needs.  Being addressed via supplements  GOAL:   Patient will meet greater than or equal to 90% of their needs  Met via supplements and improve po   MONITOR:   PO intake, Supplement acceptance, Weight trends, Labs, I & O's, Skin  REASON FOR ASSESSMENT:   Consult Assessment of nutrition requirement/status  ASSESSMENT:   Patient with PMH significant for delusional disorder, essential HTN, and uncontrolled DM. Presents this admission with sepsis likely 2/2 to bilateral foot ulcers.   3/06 Transmetatarsal amputation of right foot with wound vac placement, I&D of left great toe  Medically stable for discharge; SNF placement pending  Recorded po intake 100% of most meals. Eating well   Labs: reviewed Meds: MVI with Minerals, reglan prn, ss novolog, novolog 70/30, ferrous sulfate, B-12    Diet Order:   Diet Order            Diet Carb Modified Fluid consistency: Thin; Room service appropriate? Yes  Diet effective now              EDUCATION NEEDS:   Not appropriate for education at this time  Skin:  Skin Assessment: Skin Integrity Issues: Skin Integrity Issues:: Other (Comment), Wound VAC Wound Vac: transmetatarsal amputation on right foot on 3/06 Diabetic Ulcer: bilateral feet Other: I&D of L. great toe on 3/06  Last BM:  3/25  Height:   Ht Readings from Last 1 Encounters:  02/01/20 5' 8"  (1.727 m)    Weight:   Wt Readings from Last 1 Encounters:  02/29/20 78 kg    Ideal Body Weight:  70 kg  BMI:  Body mass index is 26.15 kg/m.  Estimated Nutritional Needs:   Kcal:   2100-2300 kcal  Protein:  105-120 grams  Fluid:  >/= 2.1 L/day   Kerman Passey MS, RDN, LDN, CNSC RD Pager Number and Weekend/On-Call After Hours Pager Located in [place]

## 2020-02-29 NOTE — Plan of Care (Signed)
  Problem: Health Behavior/Discharge Planning: Goal: Ability to manage health-related needs will improve Outcome: Progressing   

## 2020-02-29 NOTE — Progress Notes (Signed)
PROGRESS NOTE    Orange Hilligoss  IOM:[id number] DOB: 25-Apr-1961 DOA: 02/01/2020 PCP: Patient, No Pcp Per   Brief Narrative: 59 year old male with Unknown medical history, who has not seen a doctor [id number] presented to the ER on 3/4 with weakness and falls x2 weeks and associated fever dyspnea cough, chronic buttock pain since the fall along with limitation of range of motion, right arm pain. 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 Patient is on Ancef as per ID and planned for 6 weeks of IV antibiotic.  PT/OT recommended skilled nursing facility.   Awaiting Medicaid application, sister working with a Development worker, community, awaiting placement.  Had prolonged hospital course.  Subjective On bedside chair, resting, no neck pain , non focal, some Rt arm weakness not new. 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  Seen by ID, orthopedics, neurosurgery.  TEE negative for endocarditis.  Status post I&D of ulcer on the left great toe.  Neurosurgery recommended 6 weeks to 3 months of antibiotic, repeat MRI C-spine with and without contrast in 6 weeks.  Repeat blood cultures have been negative.  Seen by ID and plan is to continue Ancef for 6 weeks through 03/16/2020. He needs follow-up with ID as an outpatient Orthopedics following  Rt foot sutures removed  3/31.  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  Case discussed with ENT, recommended antibiotic management, no surgical intervention.  AKI: Likely associated to ATN in the setting of sepsis.  Resolved.  No obstruction on ultrasound.  Diabetes mellitus with hyperglycemia: A1c uncontrolled at 11.3.  Not on insulin at home.  Currently on 70/30 insulin 8 units twice daily and sliding scale insulin..  Diabetic coordinator was following.  Patient and sister has been educated on insulin injection-please confirm prior to discharge. Recent Labs  Lab 02/28/20 1119 02/28/20 1650 02/28/20 2145 02/29/20 0646 02/29/20 1127  GLUCAP 166* 139* 172* 183* 184*   Left foot ulcer: Continue wound care.  Status post right TMA.Sutures removed 3/31  Generalized weakness/frequent falls: PT/OT recommended skilled nursing facility.  Elevated liver enzymes: No finding of cholecystitis, much improved.  Ultrasound showed cholelithiasis.  Microcytic anemia: Currently hemoglobin stable.  Iron panel shows severe iron deficiency.S/P a dose of IV iron.Started  on iron supplement also  Delusional disorder/impaired memory: MRI showed mild cerebral atrophy, chronic small vessel ischemia.  Currently alert, awake and oriented.  Intermittent tangential thoughts previously.  Nutrition Problem: Increased nutrient needs Etiology: wound healing  DVT prophylaxis:SCD Code Status: Full Family Communication: Called sister on phone for update on 02/28/20 Disposition Plan: Patient is from home.  Physical therapy/Occupational Therapy recommended skilled nursing facility.  Patient is medically stable for discharge.  Awaiting Medicaid application, sister working with a Development worker, community, awaiting placement.  Consultants: ID, orthopedics, neurosurgery  Procedures: I&D, PICC line placement,TMA right  Antimicrobials:  Anti-infectives (From admission, onward)   Start     Dose/Rate Route Frequency Ordered Stop   02/03/20 1130   ceFAZolin (ANCEF) IVPB 1 g/50 mL premix  Status:  Discontinued     1 g 100 mL/hr over 30 Minutes Intravenous Every 6 hours 02/03/20 1124 02/03/20 1139   02/02/20 1100  vancomycin (VANCOREADY) IVPB 750 mg/150 mL  Status:  Discontinued     750 mg 150 mL/hr over 60 Minutes Intravenous Every 24 hours 02/01/20 1218 02/02/20 0221   02/02/20 0600  ceFAZolin (ANCEF) IVPB 2g/100 mL premix     2 g 200 mL/hr over 30 Minutes Intravenous Every 8 hours 02/02/20 0221 03/16/20 2359   02/01/20 2230  ceFEPIme (MAXIPIME) 2 g in sodium chloride 0.9 % 100 mL IVPB  Status:  Discontinued     2 g 200 mL/hr over 30 Minutes Intravenous Every 12 hours 02/01/20 1218 02/02/20 0221   02/01/20 1200  metroNIDAZOLE (FLAGYL) IVPB 500 mg  Status:  Discontinued     500 mg 100 mL/hr over 60 Minutes Intravenous Every 8 hours 02/01/20 1149 02/02/20 0221   02/01/20 1015  ceFEPIme (MAXIPIME) 2 g in sodium chloride 0.9 % 100 mL IVPB     2 g 200 mL/hr over 30 Minutes Intravenous  Once 02/01/20 1008 02/01/20 1056   02/01/20 1015  vancomycin (VANCOREADY) IVPB 1750 mg/350 mL     1,750 mg 175 mL/hr over 120 Minutes Intravenous  Once 02/01/20 1008 02/01/20 1336      Subjective: Patient seen and examined the bedside this morning.  Hemodynamically stable.  Comfortable.  Denies any complaints today. Objective: Vitals:   02/28/20 1653 02/28/20 2143 02/29/20 0534 02/29/20 0905  BP: (!) 151/87 124/78 139/82 130/74  Pulse: 90 87 85 95  Resp: 18   18  Temp: 97.8 F (36.6 C) 97.7 F (36.5 C) 98.2 F (36.8 C) 97.8 F (36.6 C)  TempSrc: Oral Oral Oral Oral  SpO2: 98% 96% 98% 100%  Weight:   78 kg   Height:        Intake/Output Summary (Last 24 hours) at 02/29/2020 1049 Last data filed at 02/29/2020 0807 Gross per 24 hour  Intake 1444.33 ml  Output 2325 ml  Net -880.67 ml   Filed Weights   02/26/20 2128 02/27/20 2116 02/29/20 0534  Weight: 73.5 kg 73.5 kg 78 kg    Examination: General exam: AAOx3 , NAD, weak  appearing. HEENT:Oral mucosa moist, Ear/Nose WNL grossly, dentition normal. Respiratory system: bilaterally clear,no wheezing or crackles,no use of accessory muscle Cardiovascular system: S1 & S2 +, No JVD,. Gastrointestinal system: Abdomen soft, NT,ND, BS+ Nervous System:Alert, awake, moving extremities and grossly nonfocal Extremities: Rt TMA- dressing +,No edema, distal peripheral pulses palpable.  Skin: No rashes,no icterus. MSK: Normal muscle bulk,tone, power PICC +  Data Reviewed: I have personally reviewed following labs and imaging studies  CBC: Recent Labs  Lab 02/24/20 0416 02/29/20 0500  WBC 7.3 7.2  NEUTROABS 4.3 3.9  HGB 9.0* 9.2*  HCT 29.8* 29.9*  MCV 73.2* 74.2*  PLT 307 937   Basic Metabolic Panel: Recent Labs  Lab 02/24/20 0416 02/29/20 0500  NA 138 140  K 3.9 4.1  CL 104 104  CO2 24 26  GLUCOSE 165* 161*  BUN 19 26*  CREATININE 0.92 1.08  CALCIUM 9.0 9.0   GFR: Estimated Creatinine Clearance: 73 mL/min (by C-G formula based on SCr of 1.08 mg/dL). Liver Function Tests: No results for input(s): AST, ALT, ALKPHOS, BILITOT, PROT, ALBUMIN in the last 168 hours. No results for input(s): LIPASE, AMYLASE in the last 168 hours. No results for input(s): AMMONIA in the last 168 hours. Coagulation Profile: No results  for input(s): INR, PROTIME in the last 168 hours. Cardiac Enzymes: No results for input(s): CKTOTAL, CKMB, CKMBINDEX, TROPONINI in the last 168 hours. BNP (last 3 results) No results for input(s): PROBNP in the last 8760 hours. HbA1C: No results for input(s): HGBA1C in the last 72 hours. CBG: Recent Labs  Lab 02/28/20 0638 02/28/20 1119 02/28/20 1650 02/28/20 2145 02/29/20 0646  GLUCAP 197* 166* 139* 172* 183*   Lipid Profile: No results for input(s): CHOL, HDL, LDLCALC, TRIG, CHOLHDL, LDLDIRECT in the last 72 hours. Thyroid Function Tests: No results for input(s): TSH, T4TOTAL, FREET4, T3FREE, THYROIDAB in the last 72  hours. Anemia Panel: No results for input(s): VITAMINB12, FOLATE, FERRITIN, TIBC, IRON, RETICCTPCT in the last 72 hours. Sepsis Labs: No results for input(s): PROCALCITON, LATICACIDVEN in the last 168 hours.  No results found for this or any previous visit (from the past 240 hour(s)).       Radiology Studies: No results found.      Scheduled Meds: . Chlorhexidine Gluconate Cloth  6 each Topical Daily  . feeding supplement (GLUCERNA SHAKE)  237 mL Oral TID BM  . feeding supplement (PRO-STAT SUGAR FREE 64)  30 mL Oral BID  . ferrous sulfate  325 mg Oral Q breakfast  . insulin aspart  0-15 Units Subcutaneous TID WC  . insulin aspart  0-5 Units Subcutaneous QHS  . insulin aspart protamine- aspart  8 Units Subcutaneous BID WC  . insulin starter kit- pen needles  1 kit Other Once  . multivitamin with minerals  1 tablet Oral Daily  . pantoprazole  20 mg Oral Daily  . tamsulosin  0.4 mg Oral Daily  . vitamin B-12  1,000 mcg Oral Daily   Continuous Infusions: . sodium chloride Stopped (02/24/20 2156)  .  ceFAZolin (ANCEF) IV 2 g (02/29/20 0521)  . methocarbamol (ROBAXIN) IV       LOS: 28 days    Time spent: 15 mins.More than 50% of that time was spent in counseling and/or coordination of care.      Antonieta Pert, MD [facility]/12/2019, 10:49 AM

## 2020-03-01 LAB — GLUCOSE, CAPILLARY
Glucose-Capillary: 270 mg/dL — ABNORMAL HIGH (ref 70–99)
Glucose-Capillary: 113 mg/dL — ABNORMAL HIGH (ref 70–99)

## 2020-03-01 MED ORDER — TAMSULOSIN HCL 0.4 MG PO CAPS: 0.4000 mg | ORAL_CAPSULE | Freq: Every day | ORAL | Status: AC

## 2020-03-01 MED ORDER — FERROUS SULFATE 325 (65 FE) MG PO TABS: 325.0000 mg | ORAL_TABLET | Freq: Every day | ORAL | 3 refills | Status: AC

## 2020-03-01 MED ORDER — POLYETHYLENE GLYCOL 3350 17 G PO PACK: 17.0000 g | PACK | Freq: Every day | ORAL | 0 refills | Status: AC | PRN

## 2020-03-01 MED ORDER — CEFAZOLIN IV (FOR PTA / DISCHARGE USE ONLY): 2.0000 g | Freq: Three times a day (TID) | INTRAVENOUS | 0 refills | Status: AC

## 2020-03-01 MED ORDER — GLUCERNA SHAKE PO LIQD: 237.0000 mL | Freq: Three times a day (TID) | ORAL | 0 refills | Status: AC

## 2020-03-01 MED ORDER — PRO-STAT SUGAR FREE PO LIQD: 30.0000 mL | Freq: Two times a day (BID) | ORAL | 0 refills | Status: AC

## 2020-03-01 MED ORDER — CYANOCOBALAMIN 1000 MCG PO TABS: 1000.0000 ug | ORAL_TABLET | Freq: Every day | ORAL | Status: AC

## 2020-03-01 MED ORDER — INSULIN ASPART PROT & ASPART (70-30 MIX) 100 UNIT/ML ~~LOC~~ SUSP: 8.0000 [IU] | Freq: Two times a day (BID) | SUBCUTANEOUS | 11 refills | Status: AC

## 2020-03-01 MED ORDER — BISACODYL 10 MG RE SUPP: 10.0000 mg | Freq: Every day | RECTAL | 0 refills | Status: AC | PRN

## 2020-03-01 MED ORDER — HEPARIN SOD (PORK) LOCK FLUSH 100 UNIT/ML IV SOLN
250.0000 [IU] | INTRAVENOUS | Status: AC | PRN
  Administered 2020-03-01: 250 [IU]
  Filled 2020-03-01: qty 2.5

## 2020-03-01 NOTE — Discharge Summary (Signed)
Physician Discharge Summary  Chris Bowman ATF:[id number] DOB: 1961/03/01 DOA: 02/01/2020  PCP: Patient, No Pcp Per  Admit date: 02/01/2020 Discharge date: 03/01/2020  Admitted From: home Disposition:  SNF  Recommendations for Outpatient Follow-up:  1. Follow up with infectious disease Dr. Baxter Flattery on 4/6  2. PCP in 1-2 weeks 3. Please check cMP/CBC and other labs as instructed while on IV antibiotics  4. Please follow up on the following pending results:  Home Health:NO  Equipment/Devices: PICC LINE  Discharge Condition: Stable Code Status: FULL Diet recommendation: Heart Healthy, diabetic  Brief/Interim Summary:  59 year old male with Unknown medical history, who has not seen a doctor [id number] presented to the ER on 3/4 with weakness and falls x2 weeks and associated fever dyspnea cough, chronic buttock pain since the fall along with limitation of range of motion, right arm pain. 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 Patient is on Ancef as per ID and planned for 6 weeks of IV antibiotic.  PT/OT recommended skilled nursing facility.   He was waiting for placement pending his insurance approval for few weks. At this time social was able to find skilled nursing facility anyone who can accept the patient.   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  Seen by ID, orthopedics, neurosurgery.  TEE negative  for endocarditis.  Status post I&D of ulcer on the left great toe.  Neurosurgery recommended 6 weeks to 3 months of antibiotic, repeat MRI C-spine with and without contrast in 6 weeks.  Blood cultures on 02/03/2019 has been negative previously was positive until 3/4.21.Seen by ID and plan is to continue Ancef for 6 weeks through 03/16/2020 and may need a follow-up MRI and extension of IV antibiotics versus oral continuation at the end of the 6 weeks.  I have asked Dr. Megan Salon and he has arranged to follow-up with Dr. Baxter Flattery on 4/6, instruction provided to the patient. Seen by ortho- he will f./u with Dr Sharol Given.  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  Case discussed with ENT, recommended antibiotic management, no surgical intervention.  AKI: Likely associated to ATN in the setting of sepsis.  Resolved.  No obstruction on ultrasound.  Diabetes mellitus with hyperglycemia: A1c uncontrolled at 11.3.  Not on insulin at home.  Currently on 70/30 insulin 8 units twice daily, cont same.Patient and sister has been educated on insulin injection Last Labs          Recent Labs  Lab 02/28/20 1119 02/28/20 1650 02/28/20 2145 02/29/20 0646 02/29/20 1127  GLUCAP 166* 139* 172* 183* 184*     Left foot ulcer: Continue wound care.  Status post right TMA.Sutures removed 3/31  Generalized weakness/frequent falls: PT/OT recommended skilled nursing facility.  Elevated liver enzymes: No finding of cholecystitis, much improved.  Ultrasound showed cholelithiasis.  Microcytic anemia: Currently hemoglobin stable.  Iron panel shows severe iron deficiency.S/P a dose of IV iron.Started  on iron supplement and b12  Delusional disorder/impaired memory: MRI showed mild cerebral atrophy, chronic small vessel ischemia.  Currently alert, awake and  oriented.  Intermittent tangential thoughts previously.  Nutrition Problem: Increased nutrient needs.  Continue to  augment nutrition. Etiology: wound healing  Consultants: ID, orthopedics, neurosurgery  Procedures: I&D, PICC line placement,TMA right   Subjective: Doing well no fever.  No nausea vomiting.  No neck pain, no focal weakness  Discharge Exam: Vitals:   03/01/20 0422 03/01/20 0901  BP: (!) 150/84 99/61  Pulse: 95 96  Resp: 16 18  Temp: 98.3 F (36.8 C) 97.7 F (36.5 C)  SpO2: 100% 100%   General: Pt is alert, awake, not in acute distress Cardiovascular: RRR, S1/S2 +, no rubs, no gallops Respiratory: CTA bilaterally, no wheezing, no rhonchi Abdominal: Soft, NT, ND, bowel sounds +. Rt foot in dressing. Extremities: no edema, no cyanosis  Discharge Instructions  Discharge Instructions    Call MD for:  severe uncontrolled pain   Complete by: As directed    If uncontrolled pain underneath current back numbness tingling or any weakness   Diet Carb Modified   Complete by: As directed    Discharge instructions   Complete by: As directed    Please follow-up with infectious disease doctor on 4/6 for possible further management of you infection  Please call call MD or return to ER for similar or worsening recurring problem that brought you to hospital or if any fever,nausea/vomiting,abdominal pain, uncontrolled pain, chest pain,  shortness of breath or any other alarming symptoms.  Please follow-up your doctor as instructed in a week time and call the office for appointment.  Please avoid alcohol, smoking, or any other illicit substance and maintain healthy habits including taking your regular medications as prescribed.  You were cared for by a hospitalist during your hospital stay. If you have any questions about your discharge medications or the care you received while you were in the hospital after you are discharged, you can call the unit and ask to speak with the hospitalist on call if the hospitalist that took care of you is not available.  Once you are discharged, your  primary care physician will handle any further medical issues. Please note that NO REFILLS for any discharge medications will be authorized once you are discharged, as it is imperative that you return to your primary care physician (or establish a relationship with a primary care physician if you do not have one) for your aftercare needs so that they can reassess your need for medications and monitor your lab values  Check blood sugar 3 times a day and bedtime at home. If blood sugar running above 200 less than 70 please call your MD to adjust insulin. If blood sugars running less 100 do not use insulin and call MD. If you noticed signs and symptoms of hypoglycemia or low blood sugar like jitteriness, confusion, thirst, tremor, sweating- Check blood sugar, drink sugary drink/biscuits/sweets to increase sugar level and call MD or return to ER.   Home infusion instructions   Complete by: As directed    Instructions: Flushing of vascular access device: 0.9% NaCl pre/post medication administration and prn patency; Heparin 100 u/ml, 29m for implanted ports and Heparin 10u/ml, 530mfor all other central venous catheters.   Increase activity slowly   Complete by: As directed      Allergies as of 03/01/2020      Reactions   Codeine    Kept sedated       Medication List    TAKE these medications   acetaminophen 325 MG tablet Commonly known as: TYLENOL Take  650 mg by mouth every 6 (six) hours as needed for mild pain.   bisacodyl 10 MG suppository Commonly known as: DULCOLAX Place 1 suppository (10 mg total) rectally daily as needed for moderate constipation.   ceFAZolin  IVPB Commonly known as: ANCEF Inject 2 g into the vein every 8 (eight) hours. Indication:  MSSA bacteremia and diabetic foot osteomyelitis Last Day of Therapy:  03/16/2020 Labs - Once weekly:  CBC/D and BMP, Labs - Every other week:  ESR and CRP   cyanocobalamin 1000 MCG tablet Take 1 tablet (1,000 mcg total) by mouth  daily. Start taking on: March 02, 2020   feeding supplement (GLUCERNA SHAKE) Liqd Take 237 mLs by mouth 3 (three) times daily between meals.   feeding supplement (PRO-STAT SUGAR FREE 64) Liqd Take 30 mLs by mouth 2 (two) times daily.   ferrous sulfate 325 (65 FE) MG tablet Take 1 tablet (325 mg total) by mouth daily with breakfast. Start taking on: March 02, 2020   insulin aspart protamine- aspart (70-30) 100 UNIT/ML injection Commonly known as: NOVOLOG MIX 70/30 Inject 0.08 mLs (8 Units total) into the skin 2 (two) times daily with a meal.   polyethylene glycol 17 g packet Commonly known as: MIRALAX / GLYCOLAX Take 17 g by mouth daily as needed for mild constipation.   tamsulosin 0.4 MG Caps capsule Commonly known as: FLOMAX Take 1 capsule (0.4 mg total) by mouth daily. Start taking on: March 02, 2020            Home Infusion Instuctions  (From admission, onward)         Start     Ordered   03/01/20 0000  Home infusion instructions    Question:  Instructions  Answer:  Flushing of vascular access device: 0.9% NaCl pre/post medication administration and prn patency; Heparin 100 u/ml, 34m for implanted ports and Heparin 10u/ml, 551mfor all other central venous catheters.   03/01/20 1126         Follow-up Information    DuNewt MinionMD In 1 week.   Specialty: Orthopedic Surgery Contact information: [address] [phone]        SnCarlyle BasquesMD Follow up.   Specialty: Infectious Diseases Why: 03/05/20 at 2:30 pm.  Contact information: [address] [place] [phone]        Allergies  Allergen Reactions  . Codeine     Kept sedated     The results of significant diagnostics from this hospitalization (including imaging, microbiology, ancillary and laboratory) are listed below for reference.    Microbiology: Recent Results (from the past 240 hour(s))  SARS CORONAVIRUS 2 (TAT 6-24 HRS)  Nasopharyngeal Nasopharyngeal Swab     Status: None   Collection Time: 02/29/20  3:44 PM   Specimen: Nasopharyngeal Swab  Result Value Ref Range Status   SARS Coronavirus 2 NEGATIVE NEGATIVE Final    Comment: (NOTE) SARS-CoV-2 target nucleic acids are NOT DETECTED. The SARS-CoV-2 RNA is generally detectable in upper and lower respiratory specimens during the acute phase of infection. Negative results do not preclude SARS-CoV-2 infection, do not rule out co-infections with other pathogens, and should not be used as the sole basis for treatment or other patient management decisions. Negative results must be combined with clinical observations, patient history, and epidemiological information. The expected result is Negative. Fact Sheet for Patients: htSugarRoll.beact Sheet for Healthcare Providers: hthttps://www.woods-mathews.com/his test is not yet approved  or cleared by the [place] and  has been authorized for detection and/or diagnosis of SARS-CoV-2 by FDA under an Emergency Use Authorization (EUA). This EUA will remain  in effect (meaning this test can be used) for the duration of the COVID-19 declaration under Section 56 4(b)(1) of the Act, 21 U.S.C. section 360bbb-3(b)(1), unless the authorization is terminated or revoked sooner. Performed at [facility], [place] [address], [place], [place] [address]     Procedures/Studies: DG Shoulder Right  Result Date: 02/01/2020 CLINICAL DATA:  59 year old male with fall and right shoulder pain. EXAM: RIGHT SHOULDER - 2+ VIEW COMPARISON:  Chest radiograph dated 02/01/2020. FINDINGS: There is no acute fracture or dislocation. No significant arthritic changes. Mild elevation of the right humeral head may represent chronic rotator cuff injury. There is degenerative changes of the right AC joint. The soft tissues are unremarkable. IMPRESSION: No acute fracture or dislocation. Electronically  Signed   By: Anner Crete M.D.   On: 02/01/2020 19:17   MR BRAIN WO CONTRAST  Result Date: 02/01/2020 CLINICAL DATA:  Generalized weakness, gait instability, and multiple falls. EXAM: MRI HEAD WITHOUT CONTRAST MRI CERVICAL SPINE WITHOUT CONTRAST TECHNIQUE: Multiplanar, multiecho pulse sequences of the brain and surrounding structures, and cervical spine, to include the craniocervical junction and cervicothoracic junction, were obtained without intravenous contrast. COMPARISON:  None. FINDINGS: MRI HEAD FINDINGS The study is mildly motion degraded. Brain: There is no evidence of acute infarct, intracranial hemorrhage, mass, midline shift, or extra-axial fluid collection. There is mild cerebral atrophy. Periventricular white matter T2 hyperintensities are nonspecific but compatible with minimal chronic small vessel ischemic disease. Vascular: Major intracranial vascular flow voids are preserved. Skull and upper cervical spine: No suspicious marrow lesion. Sinuses/Orbits: Unremarkable orbits. Minimal left ethmoid air cell mucosal thickening. At most trace right mastoid fluid. Other: None. MRI CERVICAL SPINE FINDINGS The study is motion degraded throughout including severe motion on the axial sequences. Alignment: No significant listhesis. Vertebrae: No fracture or suspicious marrow lesion. Chronic degenerative endplate changes at O6-7 associated with severe disc space narrowing. Moderate disc space narrowing at C4-5. No discal fluid signal or frank vertebral marrow edema to indicate discitis or osteomyelitis. Small right facet joint effusion at C5-6 without substantial facet marrow edema. Cord: No cord signal abnormality identified within limitations of motion artifact. Posterior Fossa, vertebral arteries, paraspinal tissues: There is prevertebral edema, and there are apparent fluid collections along the right anterolateral aspects of the C3-C5 vertebral bodies measuring up to approximately 4 cm in craniocaudal  length and with a maximal transverse diameter of approximately 1.3 cm. There is a diffusely edematous appearance of the right scalene musculature more inferiorly in the neck. The vertebral artery flow voids are grossly preserved bilaterally. Disc levels: A small ventral epidural fluid collection is suspected asymmetrically to the right of midline extending from the C3 inferior to C5 superior endplate levels (for example series 5, image 7 and series 10, image 22). This measures 2-2.5 cm in craniocaudal length and less than 1 cm in transverse dimension and contributes to mild spinal stenosis at these levels without frank cord compression. Detailed assessment of degenerative changes is limited by motion, however there is no significant spinal stenosis elsewhere. Disc bulging and uncovertebral spurring result in neural foraminal stenosis which is likely moderate bilaterally at C3-4, severe on the right and moderate to severe on the left at C4-5, mild on the right at C5-6, and moderate on the right at C6-7. IMPRESSION: MRI HEAD: 1. No  acute intracranial abnormality. 2. Mild cerebral atrophy and chronic small vessel ischemia. MRI CERVICAL SPINE: 1. Severely motion degraded examination. 2. Prevertebral edema with right-sided paravertebral fluid collections in the mid to lower cervical spine concerning for infection and abscesses with suspected myositis involving the right-sided scalene musculature. 3. Suspected small ventral epidural abscess or phlegmon from C3-C5 with mild spinal stenosis. No cord compression. 4. No definite evidence of discitis or osteomyelitis. 5. Small volume facet joint fluid on the right at C5-6 is favored to be degenerative although septic arthritis is not excluded. These results will be called to the ordering clinician or representative by the Radiologist Assistant, and communication documented in the PACS or zVision Dashboard. Electronically Signed   By: Logan Bores M.D.   On: 02/01/2020 19:38    MR CERVICAL SPINE WO CONTRAST  Result Date: 02/01/2020 CLINICAL DATA:  Generalized weakness, gait instability, and multiple falls. EXAM: MRI HEAD WITHOUT CONTRAST MRI CERVICAL SPINE WITHOUT CONTRAST TECHNIQUE: Multiplanar, multiecho pulse sequences of the brain and surrounding structures, and cervical spine, to include the craniocervical junction and cervicothoracic junction, were obtained without intravenous contrast. COMPARISON:  None. FINDINGS: MRI HEAD FINDINGS The study is mildly motion degraded. Brain: There is no evidence of acute infarct, intracranial hemorrhage, mass, midline shift, or extra-axial fluid collection. There is mild cerebral atrophy. Periventricular white matter T2 hyperintensities are nonspecific but compatible with minimal chronic small vessel ischemic disease. Vascular: Major intracranial vascular flow voids are preserved. Skull and upper cervical spine: No suspicious marrow lesion. Sinuses/Orbits: Unremarkable orbits. Minimal left ethmoid air cell mucosal thickening. At most trace right mastoid fluid. Other: None. MRI CERVICAL SPINE FINDINGS The study is motion degraded throughout including severe motion on the axial sequences. Alignment: No significant listhesis. Vertebrae: No fracture or suspicious marrow lesion. Chronic degenerative endplate changes at L5-4 associated with severe disc space narrowing. Moderate disc space narrowing at C4-5. No discal fluid signal or frank vertebral marrow edema to indicate discitis or osteomyelitis. Small right facet joint effusion at C5-6 without substantial facet marrow edema. Cord: No cord signal abnormality identified within limitations of motion artifact. Posterior Fossa, vertebral arteries, paraspinal tissues: There is prevertebral edema, and there are apparent fluid collections along the right anterolateral aspects of the C3-C5 vertebral bodies measuring up to approximately 4 cm in craniocaudal length and with a maximal transverse diameter of  approximately 1.3 cm. There is a diffusely edematous appearance of the right scalene musculature more inferiorly in the neck. The vertebral artery flow voids are grossly preserved bilaterally. Disc levels: A small ventral epidural fluid collection is suspected asymmetrically to the right of midline extending from the C3 inferior to C5 superior endplate levels (for example series 5, image 7 and series 10, image 22). This measures 2-2.5 cm in craniocaudal length and less than 1 cm in transverse dimension and contributes to mild spinal stenosis at these levels without frank cord compression. Detailed assessment of degenerative changes is limited by motion, however there is no significant spinal stenosis elsewhere. Disc bulging and uncovertebral spurring result in neural foraminal stenosis which is likely moderate bilaterally at C3-4, severe on the right and moderate to severe on the left at C4-5, mild on the right at C5-6, and moderate on the right at C6-7. IMPRESSION: MRI HEAD: 1. No acute intracranial abnormality. 2. Mild cerebral atrophy and chronic small vessel ischemia. MRI CERVICAL SPINE: 1. Severely motion degraded examination. 2. Prevertebral edema with right-sided paravertebral fluid collections in the mid to lower cervical spine concerning for infection  and abscesses with suspected myositis involving the right-sided scalene musculature. 3. Suspected small ventral epidural abscess or phlegmon from C3-C5 with mild spinal stenosis. No cord compression. 4. No definite evidence of discitis or osteomyelitis. 5. Small volume facet joint fluid on the right at C5-6 is favored to be degenerative although septic arthritis is not excluded. These results will be called to the ordering clinician or representative by the Radiologist Assistant, and communication documented in the PACS or zVision Dashboard. Electronically Signed   By: Logan Bores M.D.   On: 02/01/2020 19:38   MR CERVICAL SPINE W WO CONTRAST  Result Date:  02/11/2020 CLINICAL DATA:  Question epidural abscess. Recent falls and weakness. Hyperglycemia. MSSA bacteremia. Osteomyelitis of the foot. Previous abnormal MRI of the cervical spine with possible epidural abscess. EXAM: MRI CERVICAL AND LUMBAR SPINE WITHOUT AND WITH CONTRAST TECHNIQUE: Multiplanar and multiecho pulse sequences of the cervical spine, to include the craniocervical junction and cervicothoracic junction, and lumbar spine, were obtained without and with intravenous contrast. CONTRAST:  65m GADAVIST GADOBUTROL 1 MMOL/ML IV SOLN COMPARISON:  MRI of the cervical spine without contrast 02/01/2020. FINDINGS: MRI CERVICAL SPINE FINDINGS Alignment: Alignment is anatomic Vertebrae: Edema and enhancement is present at C4-5, most prominent on the right and posteriorly. Vertebral body heights are maintained. More subtle changes are present on the right at C6-7. Cord: Normal signal is present in the cervical and upper thoracic spinal cord. Cord morphology is preserved. Posterior Fossa, vertebral arteries, paraspinal tissues: Craniocervical junction is normal. Flow is present in the vertebral arteries bilaterally. Visualized intracranial contents are normal. Disc levels: Previously noted ventral epidural collection on the right has expanded. There is diffuse enhancement associated with the collection. Collection is centered at C4-5 but extends superiorly to the C2 level. The collection extends inferiorly to C5-6. Abnormal enhancement extends to the C1-2 level on the right into C7 inferiorly. Abnormal signal and enhancement is present within the right neural foramina at C3-4, C4-5, and C5-6. Abnormal signal and enhancement is present about the C5-6 facet joint on the right. Dorsal epidural enhancement is present at C3-4 and C4-5. MRI THORACIC SPINE FINDINGS Alignment:  No significant listhesis is present. Vertebrae: Marrow signal is diffusely depressed. No discrete lesions are present. No pathologic enhancement is  present. Conus medullaris and cauda equina: Conus extends to the L1 level. Conus and cauda equina appear normal. Paraspinal and other soft tissues: Paraspinous soft tissues are within normal limits. No enhancing soft tissue is present. Visualized lung fields are clear. Disc levels: No focal disc protrusion or stenosis is present. The central canal and foramina are patent throughout the thoracic spine. IMPRESSION: 1. Progressive enhancing epidural collection in the cervical spine centered at C4-5. Abnormal disc signal, endplate signal, and enhancement suggest disc osteomyelitis at this level. 2. Abnormal epidural fluid enhancement extending superiorly to C2-3 and inferiorly to C5. 3. Additional dural enhancement extends superiorly to the C1-2 level. 4. Subtle signal changes may represent early discitis at C6-7. 5. Cervical paraspinal disease on the right with enhancement of paraspinous soft tissues including paraspinous musculature consistent with diffuse infection. 6. Abnormal signal and enhancement within the right cervical foramina at C3-4, C4-5, and C5-6. Electronically Signed   By: CSan MorelleM.D.   On: 02/11/2020 17:16   MR THORACIC SPINE W WO CONTRAST  Result Date: 02/11/2020 CLINICAL DATA:  Question epidural abscess. Recent falls and weakness. Hyperglycemia. MSSA bacteremia. Osteomyelitis of the foot. Previous abnormal MRI of the cervical spine with possible epidural abscess. EXAM:  MRI CERVICAL AND LUMBAR SPINE WITHOUT AND WITH CONTRAST TECHNIQUE: Multiplanar and multiecho pulse sequences of the cervical spine, to include the craniocervical junction and cervicothoracic junction, and lumbar spine, were obtained without and with intravenous contrast. CONTRAST:  71m GADAVIST GADOBUTROL 1 MMOL/ML IV SOLN COMPARISON:  MRI of the cervical spine without contrast 02/01/2020. FINDINGS: MRI CERVICAL SPINE FINDINGS Alignment: Alignment is anatomic Vertebrae: Edema and enhancement is present at C4-5, most  prominent on the right and posteriorly. Vertebral body heights are maintained. More subtle changes are present on the right at C6-7. Cord: Normal signal is present in the cervical and upper thoracic spinal cord. Cord morphology is preserved. Posterior Fossa, vertebral arteries, paraspinal tissues: Craniocervical junction is normal. Flow is present in the vertebral arteries bilaterally. Visualized intracranial contents are normal. Disc levels: Previously noted ventral epidural collection on the right has expanded. There is diffuse enhancement associated with the collection. Collection is centered at C4-5 but extends superiorly to the C2 level. The collection extends inferiorly to C5-6. Abnormal enhancement extends to the C1-2 level on the right into C7 inferiorly. Abnormal signal and enhancement is present within the right neural foramina at C3-4, C4-5, and C5-6. Abnormal signal and enhancement is present about the C5-6 facet joint on the right. Dorsal epidural enhancement is present at C3-4 and C4-5. MRI THORACIC SPINE FINDINGS Alignment:  No significant listhesis is present. Vertebrae: Marrow signal is diffusely depressed. No discrete lesions are present. No pathologic enhancement is present. Conus medullaris and cauda equina: Conus extends to the L1 level. Conus and cauda equina appear normal. Paraspinal and other soft tissues: Paraspinous soft tissues are within normal limits. No enhancing soft tissue is present. Visualized lung fields are clear. Disc levels: No focal disc protrusion or stenosis is present. The central canal and foramina are patent throughout the thoracic spine. IMPRESSION: 1. Progressive enhancing epidural collection in the cervical spine centered at C4-5. Abnormal disc signal, endplate signal, and enhancement suggest disc osteomyelitis at this level. 2. Abnormal epidural fluid enhancement extending superiorly to C2-3 and inferiorly to C5. 3. Additional dural enhancement extends superiorly to  the C1-2 level. 4. Subtle signal changes may represent early discitis at C6-7. 5. Cervical paraspinal disease on the right with enhancement of paraspinous soft tissues including paraspinous musculature consistent with diffuse infection. 6. Abnormal signal and enhancement within the right cervical foramina at C3-4, C4-5, and C5-6. Electronically Signed   By: CSan MorelleM.D.   On: 02/11/2020 17:16   [place] Complete  Result Date: 02/02/2020 CLINICAL DATA:  Elevated liver function tests. Renal insufficiency with elevated creatinine. EXAM: ABDOMEN ULTRASOUND COMPLETE COMPARISON:  None. FINDINGS: Gallbladder: Several gallstones are seen, largest measuring 1.2 cm. Gallbladder is incompletely distended. No abnormal gallbladder wall thickening or pericholecystic fluid noted. No sonographic Murphy sign noted by sonographer. Common bile duct: Diameter: 3 mm, within normal limits. Liver: No focal lesion identified. Within normal limits in parenchymal echogenicity. Portal vein is patent on color Doppler imaging with normal direction of blood flow towards the liver. IVC: No abnormality visualized. Pancreas: Pancreas is not visualized due to overlying bowel gas. Spleen: Size and appearance within normal limits. Right Kidney: Length: 11.3 cm. Mildly increased parenchymal echogenicity noted. No mass or hydronephrosis visualized. Left Kidney: Length: 12.3 cm. Mildly increased parenchymal echogenicity noted. No mass or hydronephrosis visualized. Abdominal aorta: No aneurysm visualized, although distal abdominal aorta obscured by overlying bowel gas. Other findings: None. IMPRESSION: Cholelithiasis. No sonographic evidence of cholecystitis or biliary ductal dilatation. Mildly increased renal  parenchymal echogenicity, consistent with medical renal disease. No evidence of hydronephrosis. Electronically Signed   By: Marlaine Hind M.D.   On: 02/02/2020 16:48   DG Hand 2 View Right  Result Date: 02/04/2020 CLINICAL DATA:   Pain EXAM: RIGHT HAND - 2 VIEW COMPARISON:  None. FINDINGS: There are degenerative changes at the proximal interphalangeal joints of the second, fourth, and fifth digits. The changes appear to be both erosive and productive. There are degenerative changes at the radiocarpal joint with subchondral cystic changes involving the scaphoid and lunate. Degenerative changes are noted at the first [facility]. Nonspecific flexion deformities are noted at the proximal interphalangeal joints of the second, fourth, and fifth digits. There is no acute displaced fracture or dislocation. IMPRESSION: 1. No acute displaced fracture or dislocation. 2. 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 3. Degenerative changes at the radiocarpal joint. Electronically Signed   By: Constance Holster M.D.   On: 02/04/2020 20:23   DG Hand 2 View Left  Result Date: 02/04/2020 CLINICAL DATA:  Pain EXAM: LEFT HAND - 2 VIEW COMPARISON:  None. FINDINGS: There are mixed erosive and productive changes of the proximal interphalangeal joints of the third through fifth digits. There are degenerative changes at the fifth metacarpophalangeal joint. Degenerative changes are noted at the radiocarpal joint. There is no acute displaced fracture or dislocation. IMPRESSION: 1. No acute displaced fracture or dislocation. 2. 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 Electronically Signed   By: Constance Holster M.D.   On: 02/04/2020 20:24   MR FOOT RIGHT WO CONTRAST  Result Date: 02/01/2020 CLINICAL DATA:  Osteomyelitis EXAM: MRI OF THE RIGHT FOREFOOT WITHOUT CONTRAST TECHNIQUE: Multiplanar, multisequence MR imaging of the right was performed. No intravenous contrast was administered. COMPARISON:  Radiograph November 29, 2007 FINDINGS: Bones/Joint/Cartilage There is increased marrow signal seen through the fifth metatarsal head, proximal fifth phalanx, fourth metatarsal head, and proximal fourth  phalanx with associated T1 hypointensity. This could be due to early osteomyelitis. Increased marrow signal seen within the distal fourth and fifth phalanges, mid fifth metatarsal shaft, second and third proximal phalanges, likely due to reactive marrow. There is large cystic type lucency seen within the navicular as on prior exam. 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 There is also osseous coalition between the second metatarsal and intermediate cuneiform as well as the medial and lateral cuneiform is. 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 Ligaments The Lisfranc ligaments and collateral ligaments are intact. Muscles and Tendons Diffusely increased signal seen throughout the muscles, likely due to microvascular disease and denervation atrophy. The flexor and extensor tendons appear to be intact. The plantar fascia is intact. Soft tissues There is focal area of superficial ulceration seen on the plantar surface of the midfoot between the fourth and fifth metatarsal heads measuring 1.7 cm in transverse dimension, series 5, image 28. No definite loculated fluid collection or sinus tract is seen. There is mild dorsal soft tissue swelling. IMPRESSION: 1. Focal area of superficial ulceration on the plantar surface beneath the fourth and fifth metatarsal heads. For no sinus tract or abscess. Findings which could be suggestive of osteomyelitis involving the fourth and fifth metatarsal heads and proximal phalanges. 2. Probable reactive marrow within the distal fourth and fifth phalanges, mid fifth metatarsal shaft, and second and third proximal phalanges.  3. Osseous coalition as described above. Electronically Signed   By: Prudencio Pair M.D.   On: 02/01/2020 22:12   MR FOOT LEFT WO CONTRAST  Result Date: 02/01/2020 CLINICAL DATA:   Osteomyelitis of the foot EXAM: MRI OF THE LEFT FOOT WITHOUT CONTRAST TECHNIQUE: Multiplanar, multisequence MR imaging of the left was performed. No intravenous contrast was administered. COMPARISON:  None. FINDINGS: Bones/Joint/Cartilage There is question of a tiny focal area of early erosive type change seen on the plantar surface of the distal phalanx series 4, image 44. Increased marrow signal seen throughout the distal phalanx of the first digit and the proximal first phalanx at the interphalangeal joint. No definite area of cortical destruction is seen. There is interphalangeal joint and MTP joint osteoarthritis at the first digit with joint space loss and subchondral cystic changes. There is osseous coalition seen at the second metatarsal cuneiform joint and as well with the medial and intermediate cuneiform. No large joint effusion is noted. There is cystic changes seen at the talonavicular joint with joint space loss as well as at the naviculocuneiform joint. Ligaments The Lisfranc ligaments and collateral ligaments appear to be intact. Muscles and Tendons There is diffusely increased signal seen throughout the muscles, likely due to microvascular disease and denervation atrophy. The flexor and extensor tendons are intact. The plantar fascia is intact. Soft tissues There is focal area of skin irregularity/superficial ulceration seen on the plantar surface of the distal first digit with skin thickening. There is diffuse subcutaneous edema. No loculated fluid collection or sinus tract however is seen. There is mild dorsal soft tissue swelling. IMPRESSION: Subtle skin ulceration with probable findings of cellulitis involving the distal digit of the first toe. No sinus tract or abscess is seen. There is question of early osteomyelitis involving the plantar surface of the distal first phalanx with a focal area of erosive type change. Probable reactive marrow seen throughout the distal and proximal first phalanx.  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 Osteoarthritis as described above. Electronically Signed   By: Prudencio Pair M.D.   On: 02/01/2020 22:06   DG Chest Portable 1 View  Result Date: 02/01/2020 CLINICAL DATA:  Generalized weakness, shortness of breath EXAM: PORTABLE CHEST 1 VIEW COMPARISON:  None FINDINGS: Heart is normal size. Lingular scarring. Right lung clear. No effusions or acute bony abnormality. IMPRESSION: No active disease. Electronically Signed   By: Rolm Baptise M.D.   On: 02/01/2020 09:49   VAS [place] ABI WITH/WO TBI  Result Date: 02/02/2020 LOWER EXTREMITY DOPPLER STUDY Indications: Ulceration, and bilateral foot ulcers. High Risk Factors: Hypertension, Diabetes.  Comparison Study: no prior Performing Technologist: Abram Sander RVS  Examination Guidelines: A complete evaluation includes at minimum, Doppler waveform signals and systolic blood pressure reading at the level of bilateral brachial, anterior tibial, and posterior tibial arteries, when vessel segments are accessible. Bilateral testing is considered an integral part of a complete examination. Photoelectric Plethysmograph (PPG) waveforms and toe systolic pressure readings are included as required and additional duplex testing as needed. Limited examinations for reoccurring indications may be performed as noted.  ABI Findings: +---------+------------------+-----+---------+--------+ Right    Rt Pressure (mmHg)IndexWaveform Comment  +---------+------------------+-----+---------+--------+ Brachial 1434                   triphasic         +---------+------------------+-----+---------+--------+ PTA      117  0.82 triphasic         +---------+------------------+-----+---------+--------+ DP       144               1.01 triphasic         +---------+------------------+-----+---------+--------+ Great Toe68                0.05                    +---------+------------------+-----+---------+--------+ +---------+------------------+-----+---------+-------+ Left     Lt Pressure (mmHg)IndexWaveform Comment +---------+------------------+-----+---------+-------+ Brachial 134                    triphasic        +---------+------------------+-----+---------+-------+ PTA      123               0.86 triphasic        +---------+------------------+-----+---------+-------+ DP       127               0.89 triphasic        +---------+------------------+-----+---------+-------+ Great Toe68                0.05                  +---------+------------------+-----+---------+-------+ +-------+-----------+-----------+------------+------------+ ABI/TBIToday's ABIToday's TBIPrevious ABIPrevious TBI +-------+-----------+-----------+------------+------------+ Right  1.10                                           +-------+-----------+-----------+------------+------------+ Left   0.89                                           +-------+-----------+-----------+------------+------------+  Summary: Right: Resting right ankle-brachial index is within normal range. No evidence of significant right lower extremity arterial disease. The right toe-brachial index is abnormal. Left: Resting left ankle-brachial index indicates mild left lower extremity arterial disease. The left toe-brachial index is abnormal.  *See table(s) above for measurements and observations.  Electronically signed by Monica Martinez MD on 02/02/2020 at 4:35:41 PM.    Final    ECHOCARDIOGRAM COMPLETE  Result Date: 02/02/2020    ECHOCARDIOGRAM REPORT   Patient Name:   Chris Bowman Date of Exam: 02/02/2020 Medical Rec #:  [id number]     Height: Accession #:    [id number]    Weight: Date of Birth:  10-12-1961    BSA: Patient Age:    70 years      BP:           122/63 mmHg Patient Gender: M             HR:           97 bpm. Exam Location:  Inpatient Procedure: 2D Echo  Indications:    bacteremia 790.7  History:        Patient has no prior history of Echocardiogram examinations.                 Risk Factors:Diabetes and Hypertension.  Sonographer:    Jannett Celestine RDCS (AE) Referring Phys: [id number] [place]  1. Left ventricular ejection fraction, by estimation, is 65 to 70%. The left ventricle has normal function. The left ventricle has no regional wall motion abnormalities. There is mild left ventricular hypertrophy. Left ventricular diastolic  parameters are consistent with Grade II diastolic dysfunction (pseudonormalization).  2. Right ventricular systolic function is normal. The right ventricular size is normal.  3. Left atrial size was moderately dilated.  4. The mitral valve is normal in structure and function. Trivial mitral valve regurgitation.  5. The aortic valve is tricuspid. Aortic valve regurgitation is not visualized.  6. The inferior vena cava is dilated in size with >50% respiratory variability, suggesting right atrial pressure of 8 mmHg.  7. Technically limited sutdy due to poor acoustic windows. If suspicion is high for endocarditis would consider TEE to further evalaute. FINDINGS  Left Ventricle: Left ventricular ejection fraction, by estimation, is 65 to 70%. The left ventricle has normal function. The left ventricle has no regional wall motion abnormalities. The left ventricular internal cavity size was normal in size. There is  mild left ventricular hypertrophy. Left ventricular diastolic parameters are consistent with Grade II diastolic dysfunction (pseudonormalization). Right Ventricle: The right ventricular size is normal. No increase in right ventricular wall thickness. Right ventricular systolic function is normal. Left Atrium: Left atrial size was moderately dilated. Right Atrium: Right atrial size was normal in size. Pericardium: There is no evidence of pericardial effusion. Mitral Valve: The mitral valve is normal in structure and function.  Trivial mitral valve regurgitation. Tricuspid Valve: The tricuspid valve is normal in structure. Tricuspid valve regurgitation is trivial. Aortic Valve: The aortic valve is tricuspid. Aortic valve regurgitation is not visualized. There is mild calcification of the aortic valve. Pulmonic Valve: The pulmonic valve was grossly normal. Pulmonic valve regurgitation is trivial. Aorta: The aortic root and ascending aorta are structurally normal, with no evidence of dilitation. Venous: The inferior vena cava is dilated in size with greater than 50% respiratory variability, suggesting right atrial pressure of 8 mmHg. IAS/Shunts: No atrial level shunt detected by color flow Doppler.  LEFT VENTRICLE PLAX 2D LVIDd:         4.00 cm Diastology LV PW:         1.50 cm LV e' lateral:   6.85 cm/s LV IVS:        1.30 cm LV E/e' lateral: 15.5                        LV e' medial:    7.51 cm/s                        LV E/e' medial:  14.1  RIGHT VENTRICLE RV Basal diam:  4.19 cm RV S prime:     150.00 cm/s TAPSE (M-mode): 1.9 cm LEFT ATRIUM LA diam:      4.70 cm LA Vol (A2C): 11.5 ml LA Vol (A4C): 23.7 ml  AORTIC VALVE LVOT Vmax:   104.00 cm/s LVOT VTI:    19.100 m MITRAL VALVE MV PHT:      2.78 msec MV E velocity: 106.00 cm/s MV A velocity: 8900.00 cm/s MV E/A ratio:  0.1 Glori Bickers MD Electronically signed by Glori Bickers MD Signature Date/Time: 02/02/2020/7:00:59 PM    Final    ECHO TEE  Result Date: 02/05/2020    TRANSESOPHOGEAL ECHO REPORT   Patient Name:   Chris Bowman Date of Exam: 02/05/2020 Medical Rec #:  [id number]     Height:       68.0 in Accession #:    [id number]    Weight:       203.5 lb Date of Birth:  August 15, 1961  BSA:          2.059 m Patient Age:    49 years      BP:           92/47 mmHg Patient Gender: M             HR:           81 bpm. Exam Location:  Inpatient Procedure: 2D Echo, Color Doppler and Cardiac Doppler Indications:     bacteremia 790.7  History:         Patient has prior history of  Echocardiogram examinations, most                  recent 02/02/2020.  Sonographer:     Philipp Deputy Referring Phys:  [id number] [address]olphin WEAVER Diagnosing Phys: Cherlynn Kaiser MD PROCEDURE: After discussion of the risks and benefits of a TEE, an informed consent was obtained from the patient. The transesophogeal probe was passed without difficulty through the esophogus of the patient. Local oropharyngeal anesthetic was provided with Cetacaine. Sedation performed by different physician. The patient was monitored while under deep sedation. Image quality was adequate. The patient's vital signs; including heart rate, blood pressure, and oxygen saturation; remained stable throughout  the procedure. The patient developed no complications during the procedure. IMPRESSIONS  1. Left ventricular ejection fraction, by estimation, is 60 to 65%. The left ventricle has normal function. The left ventricle has no regional wall motion abnormalities.  2. Right ventricular systolic function is normal. The right ventricular size is normal.  3. Left atrial size was mildly dilated. No left atrial/left atrial appendage thrombus was detected.  4. 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 Mild mitral valve regurgitation.  5. The aortic valve is normal in structure. Aortic valve regurgitation is not visualized.  6. There is mild (Grade II) atheroma plaque involving the transverse and descending aorta.  7. Evidence of atrial level shunting detected by color flow Doppler. Agitated saline contrast bubble study was positive with shunting observed within 3-6 cardiac cycles suggestive of interatrial shunt. There is a tiny patent foramen ovale with predominantly right to left shunting across the atrial septum. Conclusion(s)/Recommendation(s): No evidence of vegetation/infective endocarditis on this transesophageal echocardiogram. FINDINGS  Left Ventricle: Left ventricular ejection fraction, by estimation, is 60 to 65%.  The left ventricle has normal function. The left ventricle has no regional wall motion abnormalities. The left ventricular internal cavity size was normal in size. There is  no left ventricular hypertrophy. Right Ventricle: The right ventricular size is normal. No increase in right ventricular wall thickness. Right ventricular systolic function is normal. Left Atrium: Left atrial size was mildly dilated. No left atrial/left atrial appendage thrombus was detected. Right Atrium: Right atrial size was normal in size. Pericardium: Trivial pericardial effusion is present. 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 Normal mobility of the mitral valve leaflets. Mild mitral valve regurgitation. Tricuspid Valve: The tricuspid valve is normal in structure. Tricuspid valve regurgitation is trivial. No evidence of tricuspid stenosis. Aortic Valve: The aortic valve is normal in structure. Aortic valve regurgitation is not visualized. Pulmonic Valve: The pulmonic valve was normal in structure. Pulmonic valve regurgitation is trivial. Aorta: The aortic root and ascending aorta are structurally normal, with no evidence of dilitation. There is mild (Grade II) atheroma plaque involving the transverse and descending aorta. IAS/Shunts: The interatrial septum appears to be lipomatous. Evidence of atrial level shunting detected by color flow Doppler. Agitated  saline contrast was given intravenously to evaluate for intracardiac shunting. Agitated saline contrast bubble study was positive with shunting observed within 3-6 cardiac cycles suggestive of interatrial shunt. A tiny patent foramen ovale is detected with predominantly right to left shunting across the atrial septum.   AORTA Ao Asc diam: 3.10 cm Cherlynn Kaiser MD Electronically signed by Cherlynn Kaiser MD Signature Date/Time: 02/05/2020/8:13:15 PM    Final    [place] EKG SITE RITE  Result Date: 02/06/2020 If Site Rite image not attached, placement could not be confirmed due to  current cardiac rhythm.   Labs: BNP (last 3 results) No results for input(s): BNP in the last 8760 hours. Basic Metabolic Panel: Recent Labs  Lab 02/24/20 0416 02/29/20 0500  NA 138 140  K 3.9 4.1  CL 104 104  CO2 24 26  GLUCOSE 165* 161*  BUN 19 26*  CREATININE 0.92 1.08  CALCIUM 9.0 9.0   Liver Function Tests: No results for input(s): AST, ALT, ALKPHOS, BILITOT, PROT, ALBUMIN in the last 168 hours. No results for input(s): LIPASE, AMYLASE in the last 168 hours. No results for input(s): AMMONIA in the last 168 hours. CBC: Recent Labs  Lab 02/24/20 0416 02/29/20 0500  WBC 7.3 7.2  NEUTROABS 4.3 3.9  HGB 9.0* 9.2*  HCT 29.8* 29.9*  MCV 73.2* 74.2*  PLT 307 274   Cardiac Enzymes: No results for input(s): CKTOTAL, CKMB, CKMBINDEX, TROPONINI in the last 168 hours. BNP: Invalid input(s): POCBNP CBG: Recent Labs  Lab 02/29/20 0646 02/29/20 1127 02/29/20 1701 02/29/20 2118 03/01/20 0640  GLUCAP 183* 184* 180* 171* 270*   D-Dimer No results for input(s): DDIMER in the last 72 hours. Hgb A1c No results for input(s): HGBA1C in the last 72 hours. Lipid Profile No results for input(s): CHOL, HDL, LDLCALC, TRIG, CHOLHDL, LDLDIRECT in the last 72 hours. Thyroid function studies No results for input(s): TSH, T4TOTAL, T3FREE, THYROIDAB in the last 72 hours.  Invalid input(s): FREET3 Anemia work up No results for input(s): VITAMINB12, FOLATE, FERRITIN, TIBC, IRON, RETICCTPCT in the last 72 hours. Urinalysis    Component Value Date/Time   COLORURINE YELLOW 02/01/2020 1551   APPEARANCEUR HAZY (A) 02/01/2020 1551   LABSPEC 1.014 02/01/2020 1551   PHURINE 5.0 02/01/2020 1551   GLUCOSEU 150 (A) 02/01/2020 1551   HGBUR LARGE (A) 02/01/2020 1551   BILIRUBINUR NEGATIVE 02/01/2020 1551   KETONESUR NEGATIVE 02/01/2020 1551   PROTEINUR 100 (A) 02/01/2020 1551   NITRITE NEGATIVE 02/01/2020 1551   LEUKOCYTESUR TRACE (A) 02/01/2020 1551   Sepsis Labs Invalid input(s):  PROCALCITONIN,  WBC,  LACTICIDVEN Microbiology Recent Results (from the past 240 hour(s))  SARS CORONAVIRUS 2 (TAT 6-24 HRS) Nasopharyngeal Nasopharyngeal Swab     Status: None   Collection Time: 02/29/20  3:44 PM   Specimen: Nasopharyngeal Swab  Result Value Ref Range Status   SARS Coronavirus 2 NEGATIVE NEGATIVE Final    Comment: (NOTE) SARS-CoV-2 target nucleic acids are NOT DETECTED. The SARS-CoV-2 RNA is generally detectable in upper and lower respiratory specimens during the acute phase of infection. Negative results do not preclude SARS-CoV-2 infection, do not rule out co-infections with other pathogens, and should not be used as the sole basis for treatment or other patient management decisions. Negative results must be combined with clinical observations, patient history, and epidemiological information. The expected result is Negative. Fact Sheet for Patients: SugarRoll.be Fact Sheet for Healthcare Providers: https://www.woods-mathews.com/ This test is not yet approved or cleared by the [place] FDA and  has been authorized for detection and/or diagnosis of SARS-CoV-2 by FDA under an Emergency Use Authorization (EUA). This EUA will remain  in effect (meaning this test can be used) for the duration of the COVID-19 declaration under Section 56 4(b)(1) of the Act, 21 U.S.C. section 360bbb-3(b)(1), unless the authorization is terminated or revoked sooner. Performed at [facility], [place] [address], [place], [place] [address]      Time coordinating discharge: 35  minutes  SIGNED: Antonieta Pert, MD  [facility] 03/01/2020, 11:26 AM  If 7PM-7AM, please contact night-coverage [email or web address]

## 2020-03-01 NOTE — Progress Notes (Signed)
Call [facility],to give report.Receptionist said called back at [phone],presently they don't have a nurse at this time.

## 2020-03-01 NOTE — TOC Transition Note (Signed)
Transition of Care (TOC) - CM/SW Discharge Note *D/C to [facility] SNF, transported by ambulance *Room 7006; number to call report - [phone]   Patient Details  Name: Artem Bunte MRN: [id number] Date of Birth: 03/16/1961  Transition of Care [facility]) CM/SW Contact:  Cristobal Goldmann, LCSW Phone Number: 03/01/2020, 11:49 AM   Clinical Narrative: Patient has been medically stable for discharge and a [facility] contacted facility was located that could accept patient - [facility]. Discharge clinicals transmitted to facility and ambulance transport arranged. Sister made aware of facility acceptance on 4/1 and informed of ambulance transport today.       Final next level of care: Skilled Nursing Facility([facility]) Barriers to Discharge: No Barriers Identified   Patient Goals and CMS Choice Patient states their goals for this hospitalization and ongoing recovery are:: Patient agreeable to ST rehab before returning home CMS Medicare.gov Compare Post Acute Care list provided to:: Other (Comment Required)(List not provided as was approved by VA to d/c to a [facility] contracted facility) Choice offered to / list presented to : NA  Discharge Placement PASRR number recieved: 02/23/20([phone] E; 3/26 - 03/24/20)            Patient chooses bed at: [facility] Patient to be transferred to facility by: Ambulance Name of family member notified: [name] - sister; [phone] Patient and family notified of of transfer: 03/01/20  Discharge Plan and Services In-house Referral: Clinical Social Work                                   Social Determinants of Health (SDOH) Interventions  No SDOH interventions requested or needed prior to discharge.   Readmission Risk Interventions No flowsheet data found.

## 2020-03-01 NOTE — Discharge Instructions (Signed)
Hypoglycemia Hypoglycemia is when the sugar (glucose) level in your blood is too low. Signs of low blood sugar may include:  Feeling: ? Hungry. ? Worried or nervous (anxious). ? Sweaty and clammy. ? Confused. ? Dizzy. ? Sleepy. ? Sick to your stomach (nauseous).  Having: ? A fast heartbeat. ? A headache. ? A change in your vision. ? Tingling or no feeling (numbness) around your mouth, lips, or tongue. ? Jerky movements that you cannot control (seizure).  Having trouble with: ? Moving (coordination). ? Sleeping. ? Passing out (fainting). ? Getting upset easily (irritability). Low blood sugar can happen to people who have diabetes and people who do not have diabetes. Low blood sugar can happen quickly, and it can be an emergency. Treating low blood sugar Low blood sugar is often treated by eating or drinking something sugary right away, such as:  Fruit juice, 4-6 oz (120-150 mL).  Regular soda (not diet soda), 4-6 oz (120-150 mL).  Low-fat milk, 4 oz (120 mL).  Several pieces of hard candy.  Sugar or honey, 1 Tbsp (15 mL). Treating low blood sugar if you have diabetes If you can think clearly and swallow safely, follow the 15:15 rule:  Take 15 grams of a fast-acting carb (carbohydrate). Talk with your doctor about how much you should take.  Always keep a source of fast-acting carb with you, such as: ? Sugar tablets (glucose pills). Take 3-4 pills. ? 6-8 pieces of hard candy. ? 4-6 oz (120-150 mL) of fruit juice. ? 4-6 oz (120-150 mL) of regular (not diet) soda. ? 1 Tbsp (15 mL) honey or sugar.  Check your blood sugar 15 minutes after you take the carb.  If your blood sugar is still at or below 70 mg/dL (3.9 mmol/L), take 15 grams of a carb again.  If your blood sugar does not go above 70 mg/dL (3.9 mmol/L) after 3 tries, get help right away.  After your blood sugar goes back to normal, eat a meal or a snack within 1 hour.  Treating very low blood sugar If  your blood sugar is at or below 54 mg/dL (3 mmol/L), you have very low blood sugar (severe hypoglycemia). This may also cause:  Passing out.  Jerky movements you cannot control (seizure).  Losing consciousness (coma). This is an emergency. Do not wait to see if the symptoms will go away. Get medical help right away. Call your local emergency services (911 in the [place] Do not drive yourself to the hospital. If you have very low blood sugar and you cannot eat or drink, you may need a glucagon shot (injection). A family member or friend should learn how to check your blood sugar and how to give you a glucagon shot. Ask your doctor if you need to have a glucagon shot kit at home. Follow these instructions at home: General instructions  Take over-the-counter and prescription medicines only as told by your doctor.  Stay aware of your blood sugar as told by your doctor.  Limit alcohol intake to no more than 1 drink a day for nonpregnant women and 2 drinks a day for men. One drink equals 12 oz of beer (355 mL), 5 oz of wine (148 mL), or 1 oz of hard liquor (44 mL).  Keep all follow-up visits as told by your doctor. This is important. If you have diabetes:   Follow your diabetes care plan as told by your doctor. Make sure you: ? Know the signs of low blood  sugar. ? Take your medicines as told. ? Follow your exercise and meal plan. ? Eat on time. Do not skip meals. ? Check your blood sugar as often as told by your doctor. Always check it before and after exercise. ? Follow your sick day plan when you cannot eat or drink normally. Make this plan ahead of time with your doctor.  Share your diabetes care plan with: ? Your work or school. ? People you live with.  Check your pee (urine) for ketones: ? When you are sick. ? As told by your doctor.  Carry a card or wear jewelry that says you have diabetes. Contact a doctor if:  You have trouble keeping your blood sugar in your target  range.  You have low blood sugar often. Get help right away if:  You still have symptoms after you eat or drink something sugary.  Your blood sugar is at or below 54 mg/dL (3 mmol/L).  You have jerky movements that you cannot control.  You pass out. These symptoms may be an emergency. Do not wait to see if the symptoms will go away. Get medical help right away. Call your local emergency services (911 in the [place] Do not drive yourself to the hospital. Summary  Hypoglycemia happens when the level of sugar (glucose) in your blood is too low.  Low blood sugar can happen to people who have diabetes and people who do not have diabetes. Low blood sugar can happen quickly, and it can be an emergency.  Make sure you know the signs of low blood sugar and know how to treat it.  Always keep a source of sugar (fast-acting carb) with you to treat low blood sugar. This information is not intended to replace advice given to you by your health care provider. Make sure you discuss any questions you have with your health care provider. Document Revised: 03/09/2019 Document Reviewed: 12/20/2015 Elsevier Patient Education  [place].  Hyperglycemia Hyperglycemia occurs when the level of sugar (glucose) in the blood is too high. Glucose is a type of sugar that provides the body's main source of energy. Certain hormones (insulin and glucagon) control the level of glucose in the blood. Insulin lowers blood glucose, and glucagon increases blood glucose. Hyperglycemia can result from having too little insulin in the bloodstream, or from the body not responding normally to insulin. Hyperglycemia occurs most often in people who have diabetes (diabetes mellitus), but it can happen in people who do not have diabetes. It can develop quickly, and it can be life-threatening if it causes you to become severely dehydrated (diabetic ketoacidosis or hyperglycemic hyperosmolar state). Severe hyperglycemia is a  medical emergency. What are the causes? If you have diabetes, hyperglycemia may be caused by:  Diabetes medicine.  Medicines that increase blood glucose or affect your diabetes control.  Not eating enough, or not eating often enough.  Changes in physical activity level.  Being sick or having an infection. If you have prediabetes or undiagnosed diabetes:  Hyperglycemia may be caused by those conditions. If you do not have diabetes, hyperglycemia may be caused by:  Certain medicines, including steroid medicines, beta-blockers, epinephrine, and thiazide diuretics.  Stress.  Serious illness.  Surgery.  Diseases of the pancreas.  Infection. What increases the risk? Hyperglycemia is more likely to develop in people who have risk factors for diabetes, such as:  Having a family member with diabetes.  Having a gene for type 1 diabetes that is passed from parent to  child (inherited).  Living in an area with cold weather conditions.  Exposure to certain viruses.  Certain conditions in which the body's disease-fighting (immune) system attacks itself (autoimmune disorders).  Being overweight or obese.  Having an inactive (sedentary) lifestyle.  Having been diagnosed with insulin resistance.  Having a history of prediabetes, gestational diabetes, or polycystic ovarian syndrome (PCOS).  Being of American-Indian, African-American, Hispanic/Latino, or Asian/Pacific Islander descent. What are the signs or symptoms? Hyperglycemia may not cause any symptoms. If you do have symptoms, they may include early warning signs, such as:  Increased thirst.  Hunger.  Feeling very tired.  Needing to urinate more often than usual.  Blurry vision. Other symptoms may develop if hyperglycemia gets worse, such as:  Dry mouth.  Loss of appetite.  Fruity-smelling breath.  Weakness.  Unexpected or rapid weight gain or weight loss.  Tingling or numbness in the hands or  feet.  Headache.  Skin that does not quickly return to normal after being lightly pinched and released (poor skin turgor).  Abdominal pain.  Cuts or bruises that are slow to heal. How is this diagnosed? Hyperglycemia is diagnosed with a blood test to measure your blood glucose level. This blood test is usually done while you are having symptoms. Your health care provider may also do a physical exam and review your medical history. You may have more tests to determine the cause of your hyperglycemia, such as:  A fasting blood glucose (FBG) test. You will not be allowed to eat (you will fast) for at least 8 hours before a blood sample is taken.  An A1c (hemoglobin A1c) blood test. This provides information about blood glucose control over the previous 2-3 months.  An oral glucose tolerance test (OGTT). This measures your blood glucose at two times: ? After fasting. This is your baseline blood glucose level. ? Two hours after drinking a beverage that contains glucose. How is this treated? Treatment depends on the cause of your hyperglycemia. Treatment may include:  Taking medicine to regulate your blood glucose levels. If you take insulin or other diabetes medicines, your medicine or dosage may be adjusted.  Lifestyle changes, such as exercising more, eating healthier foods, or losing weight.  Treating an illness or infection, if this caused your hyperglycemia.  Checking your blood glucose more often.  Stopping or reducing steroid medicines, if these caused your hyperglycemia. If your hyperglycemia becomes severe and it results in hyperglycemic hyperosmolar state, you must be hospitalized and given IV fluids. Follow these instructions at home:  General instructions  Take over-the-counter and prescription medicines only as told by your health care provider.  Do not use any products that contain nicotine or tobacco, such as cigarettes and e-cigarettes. If you need help quitting, ask  your health care provider.  Limit alcohol intake to no more than 1 drink per day for nonpregnant women and 2 drinks per day for men. One drink equals 12 oz of beer, 5 oz of wine, or 1 oz of hard liquor.  Learn to manage stress. If you need help with this, ask your health care provider.  Keep all follow-up visits as told by your health care provider. This is important. Eating and drinking   Maintain a healthy weight.  Exercise regularly, as directed by your health care provider.  Stay hydrated, especially when you exercise, get sick, or spend time in hot temperatures.  Eat healthy foods, such as: ? Lean proteins. ? Complex carbohydrates. ? Fresh fruits and vegetables. ?  Low-fat dairy products. ? Healthy fats.  Drink enough fluid to keep your urine clear or pale yellow. If you have diabetes:  Make sure you know the symptoms of hyperglycemia.  Follow your diabetes management plan, as told by your health care provider. Make sure you: ? Take your insulin and medicines as directed. ? Follow your exercise plan. ? Follow your meal plan. Eat on time, and do not skip meals. ? Check your blood glucose as often as directed. Make sure to check your blood glucose before and after exercise. If you exercise longer or in a different way than usual, check your blood glucose more often. ? Follow your sick day plan whenever you cannot eat or drink normally. Make this plan in advance with your health care provider.  Share your diabetes management plan with people in your workplace, school, and household.  Check your urine for ketones when you are ill and as told by your health care provider.  Carry a medical alert card or wear medical alert jewelry. Contact a health care provider if:  Your blood glucose is at or above 240 mg/dL (13.3 mmol/L) for 2 days in a row.  You have problems keeping your blood glucose in your target range.  You have frequent episodes of hyperglycemia. Get help right  away if:  You have difficulty breathing.  You have a change in how you think, feel, or act (mental status).  You have nausea or vomiting that does not go away. These symptoms may represent a serious problem that is an emergency. Do not wait to see if the symptoms will go away. Get medical help right away. Call your local emergency services (911 in the [place] Do not drive yourself to the hospital. Summary  Hyperglycemia occurs when the level of sugar (glucose) in the blood is too high.  Hyperglycemia is diagnosed with a blood test to measure your blood glucose level. This blood test is usually done while you are having symptoms. Your health care provider may also do a physical exam and review your medical history.  If you have diabetes, follow your diabetes management plan as told by your health care provider.  Contact your health care provider if you have problems keeping your blood glucose in your target range. This information is not intended to replace advice given to you by your health care provider. Make sure you discuss any questions you have with your health care provider. Document Revised: 08/03/2016 Document Reviewed: 08/03/2016 Elsevier Patient Education  [place].  Hemoglobin A1c Test Why am I having this test? You may have the hemoglobin A1c test (HbA1c test) done to:  Evaluate your risk for developing diabetes (diabetes mellitus).  Diagnose diabetes.  Monitor long-term control of blood sugar (glucose) in people who have diabetes and help make treatment decisions. This test may be done with other blood glucose tests, such as fasting blood glucose and oral glucose tolerance tests. What is being tested? Hemoglobin is a type of protein in the blood that carries oxygen. Glucose attaches to hemoglobin to form glycated hemoglobin. This test checks the amount of glycated hemoglobin in your blood, which is a good indicator of the average amount of glucose in your blood  during the past 2-3 months. What kind of sample is taken?  A blood sample is required for this test. It is usually collected by inserting a needle into a blood vessel. Tell a health care provider about:  All medicines you are taking, including vitamins, herbs, eye  drops, creams, and over-the-counter medicines.  Any blood disorders you have.  Any surgeries you have had.  Any medical conditions you have.  Whether you are pregnant or may be pregnant. How are the results reported? Your results will be reported as a percentage that indicates how much of your hemoglobin has glucose attached to it (is glycated). Your health care provider will compare your results to normal ranges that were established after testing a large group of people (reference ranges). Reference ranges may vary among labs and hospitals. For this test, common reference ranges are:  Adult or child without diabetes: 4-5.6%.  Adult or child with diabetes and good blood glucose control: less than 7%. What do the results mean? If you have diabetes:  A result of less than 7% is considered normal, meaning that your blood glucose is well controlled.  A result higher than 7% means that your blood glucose is not well controlled, and your treatment plan may need to be adjusted. If you do not have diabetes:  A result within the reference range is considered normal, meaning that you are not at high risk for diabetes.  A result of 5.7-6.4% means that you have a high risk of developing diabetes, and you may have prediabetes. Prediabetes is the condition of having a blood glucose level that is higher than it should be, but not high enough for you to be diagnosed with diabetes. Having prediabetes puts you at risk for developing type 2 diabetes (type 2 diabetes mellitus). You may have more tests, including a repeat HbA1c test.  Results of 6.5% or higher on two separate HbA1c tests mean that you have diabetes. You may have more tests to  confirm the diagnosis. Abnormally low HbA1c values may be caused by:  Pregnancy.  Severe blood loss.  Receiving donated blood (transfusions).  Low red blood cell count (anemia).  Long-term kidney failure.  Some unusual forms (variants) of hemoglobin. Talk with your health care provider about what your results mean. Questions to ask your health care provider Ask your health care provider, or the department that is doing the test:  When will my results be ready?  How will I get my results?  What are my treatment options?  What other tests do I need?  What are my next steps? Summary  The hemoglobin A1c test (HbA1c test) may be done to evaluate your risk for developing diabetes, to diagnose diabetes, and to monitor long-term control of blood sugar (glucose) in people who have diabetes and help make treatment decisions.  Hemoglobin is a type of protein in the blood that carries oxygen. Glucose attaches to hemoglobin to form glycated hemoglobin. This test checks the amount of glycated hemoglobin in your blood, which is a good indicator of the average amount of glucose in your blood during the past 2-3 months.  Talk with your health care provider about what your results mean. This information is not intended to replace advice given to you by your health care provider. Make sure you discuss any questions you have with your health care provider. Document Revised: 10/29/2017 Document Reviewed: 06/29/2017 Elsevier Patient Education  [place].     Right foot wound dressing Instruction:       Place  Dry 4 x 4 dry gauze.Wrap with Kerlix and ace bandage daily.

## 2020-03-01 NOTE — Progress Notes (Addendum)
Report Given to [place] especial emphasis given on daily dressing on his right foot.

## 2020-03-01 NOTE — Progress Notes (Signed)
DISCHARGE NOTE SNF [facility] to be discharged [facility] per MD order. Patient verbalized understanding.  Skin clean, dry and intact without evidence of skin break down, no evidence of skin tears noted. IV catheter discontinued intact. Site without signs and symptoms of complications. Dressing and pressure applied. Pt denies pain at the site currently. No complaints noted.  Patient free of lines, drains, and wounds.Dressing done at the right foot .Bandage applied on the left great toe.  Discharge packet assembled. An After Visit Summary (AVS) was printed and given to the EMS personnel. Patient escorted via stretcher and discharged to [facility] via ambulance. Report called to accepting facility; all questions and concerns addressed.   June Rode, [facility], RN  Discharge packet assembled. An After Visit Summary (AVS) was printed and given to the EMS personnel. Patient escorted via stretcher and discharged to [facility] via ambulance. Report called to accepting facility; all questions and concerns addressed.   [place], [facility], RN

## 2020-03-05 ENCOUNTER — Other Ambulatory Visit: Payer: Self-pay

## 2020-03-05 ENCOUNTER — Encounter: Payer: Self-pay | Admitting: Internal Medicine

## 2020-03-05 ENCOUNTER — Ambulatory Visit (INDEPENDENT_AMBULATORY_CARE_PROVIDER_SITE_OTHER): Payer: No Typology Code available for payment source | Admitting: Internal Medicine

## 2020-03-05 VITALS — BP 95/64 | HR 97 | Temp 98.2°F

## 2020-03-05 DIAGNOSIS — B9561 Methicillin susceptible Staphylococcus aureus infection as the cause of diseases classified elsewhere: Secondary | ICD-10-CM

## 2020-03-05 DIAGNOSIS — R7881 Bacteremia: Secondary | ICD-10-CM | POA: Diagnosis not present

## 2020-03-05 DIAGNOSIS — D508 Other iron deficiency anemias: Secondary | ICD-10-CM

## 2020-03-05 DIAGNOSIS — M86171 Other acute osteomyelitis, right ankle and foot: Secondary | ICD-10-CM

## 2020-03-05 NOTE — Progress Notes (Signed)
RFV: follow up for osteomyelitis  Patient ID: Chris Bowman, male   DOB: 13-Jun-1961, 59 y.o.   MRN: [id number]  HPI Chris Bowman is a 59yo M with poorly controlled IDDM, complicated by diabetic foot osteomyelitis s/p right TM and left diabetic foot wound, with MSSA bacteremia. He was discharged on 6 wk of IV abtx with 4/17 end date for cefazolin  Decreased sensation to feet unchanged. Right TM is well healed.  Outpatient Encounter Medications as of 03/05/2020  Medication Sig  . acetaminophen (TYLENOL) 325 MG tablet Take 650 mg by mouth every 6 (six) hours as needed for mild pain.  . Amino Acids-Protein Hydrolys (FEEDING SUPPLEMENT, PRO-STAT SUGAR FREE 64,) LIQD Take 30 mLs by mouth 2 (two) times daily.  . bisacodyl (DULCOLAX) 10 MG suppository Place 1 suppository (10 mg total) rectally daily as needed for moderate constipation.  Marland Kitchen ceFAZolin (ANCEF) IVPB Inject 2 g into the vein every 8 (eight) hours. Indication:  MSSA bacteremia and diabetic foot osteomyelitis Last Day of Therapy:  03/16/2020 Labs - Once weekly:  CBC/D and BMP, Labs - Every other week:  ESR and CRP  . feeding supplement, GLUCERNA SHAKE, (GLUCERNA SHAKE) LIQD Take 237 mLs by mouth 3 (three) times daily between meals.  . ferrous sulfate 325 (65 FE) MG tablet Take 1 tablet (325 mg total) by mouth daily with breakfast.  . insulin aspart protamine- aspart (NOVOLOG MIX 70/30) (70-30) 100 UNIT/ML injection Inject 0.08 mLs (8 Units total) into the skin 2 (two) times daily with a meal.  . polyethylene glycol (MIRALAX / GLYCOLAX) 17 g packet Take 17 g by mouth daily as needed for mild constipation.  . tamsulosin (FLOMAX) 0.4 MG CAPS capsule Take 1 capsule (0.4 mg total) by mouth daily.  . vitamin B-12 1000 MCG tablet Take 1 tablet (1,000 mcg total) by mouth daily.   No facility-administered encounter medications on file as of 03/05/2020.     Patient Active Problem List   Diagnosis Date Noted  . Bacteremia due to methicillin susceptible  Staphylococcus aureus (MSSA) 02/10/2020  . Abscess in epidural space of cervical spine 02/10/2020  . AKI (acute kidney injury) ([place])   . Fever   . Osteomyelitis of foot, right, acute ([place])   . Type 2 diabetes mellitus with hyperglycemia, without long-term current use of insulin ([place]) 02/01/2020  . Ulcer of left foot due to type 2 diabetes mellitus ([place]) 02/01/2020  . Hyponatremia 02/01/2020  . Renal insufficiency 02/01/2020  . Weakness 02/01/2020  . Falls frequently 02/01/2020     Health Maintenance Due  Topic Date Due  . PNEUMOCOCCAL POLYSACCHARIDE VACCINE AGE 58-64 HIGH RISK  Never done  . FOOT EXAM  Never done  . OPHTHALMOLOGY EXAM  Never done  . URINE MICROALBUMIN  Never done  . TETANUS/TDAP  Never done  . COLONOSCOPY  Never done    Soc hx: no smoking or drinking  Review of Systems  Constitutional: Negative for fever, chills, diaphoresis, activity change, appetite change, fatigue and unexpected weight change.  HENT: Negative for congestion, sore throat, rhinorrhea, sneezing, trouble swallowing and sinus pressure.  Eyes: Negative for photophobia and visual disturbance.  Respiratory: Negative for cough, chest tightness, shortness of breath, wheezing and stridor.  Cardiovascular: Negative for chest pain, palpitations and leg swelling.  Gastrointestinal: Negative for nausea, vomiting, abdominal pain, diarrhea, constipation, blood in stool, abdominal distention and anal bleeding.  Genitourinary: Negative for dysuria, hematuria, flank pain and difficulty urinating.  Musculoskeletal: Negative for myalgias, back pain, joint swelling, arthralgias and  gait problem.  Skin: Negative for color change, pallor, rash and wound.  Neurological: Negative for dizziness, tremors, weakness and light-headedness.  Hematological: Negative for adenopathy. Does not bruise/bleed easily.  Psychiatric/Behavioral: Negative for behavioral problems, confusion, sleep disturbance, dysphoric mood, decreased  concentration and agitation.    Physical Exam   BP 95/64   Pulse 97   Temp 98.2 F (36.8 C) (Oral)   Physical Exam  Constitutional: He is oriented to person, place, and time. He appears well-developed and well-nourished. No distress.  HENT:  Mouth/Throat: Oropharynx is clear and moist. No oropharyngeal exudate.  Cardiovascular: Normal rate, regular rhythm and normal heart sounds. Exam reveals no gallop and no friction rub.  No murmur heard.  Pulmonary/Chest: Effort normal and breath sounds normal. No respiratory distress. He has no wheezes.  Abdominal: Soft. Bowel sounds are normal. He exhibits no distension. There is no tenderness.  Ext: right TM clean Neurological: He is alert and oriented to person, place, and time.  Skin: Skin is warm and dry. No rash noted. No erythema.  Psychiatric: He has a normal mood and affect. His behavior is normal.    CBC Lab Results  Component Value Date   WBC 7.2 02/29/2020   RBC 4.03 (L) 02/29/2020   HGB 9.2 (L) 02/29/2020   HCT 29.9 (L) 02/29/2020   PLT 274 02/29/2020   MCV 74.2 (L) 02/29/2020   MCH 22.8 (L) 02/29/2020   MCHC 30.8 02/29/2020   RDW 17.4 (H) 02/29/2020   LYMPHSABS 2.4 02/29/2020   MONOABS 0.6 02/29/2020   EOSABS 0.2 02/29/2020    BMET Lab Results  Component Value Date   NA 140 02/29/2020   K 4.1 02/29/2020   CL 104 02/29/2020   CO2 26 02/29/2020   GLUCOSE 161 (H) 02/29/2020   BUN 26 (H) 02/29/2020   CREATININE 1.08 02/29/2020   CALCIUM 9.0 02/29/2020   GFRNONAA >60 02/29/2020   GFRAA >60 02/29/2020    Lab Results  Component Value Date   ESRSEDRATE 55 (H) 03/05/2020     Assessment and Plan  mssa osteo = healing well. Will have him finish til 4/17 to finish out his treatment course. Since sed rate still elevated. We will give doxy 170m BID for 4 wk starting 4/18.   Iron def anemai = separate iron from [facility]  Will check sed rate and crp to see if need to continue on oral abtx Staying at [facility]  Will check sed rate and crp  See back in 4 wk

## 2020-03-06 LAB — SEDIMENTATION RATE: Sed Rate: 55 mm/h — ABNORMAL HIGH (ref 0–20)

## 2020-03-20 ENCOUNTER — Encounter: Payer: Self-pay | Admitting: Internal Medicine

## 2020-03-20 ENCOUNTER — Ambulatory Visit (INDEPENDENT_AMBULATORY_CARE_PROVIDER_SITE_OTHER): Payer: No Typology Code available for payment source | Admitting: Internal Medicine

## 2020-03-20 ENCOUNTER — Other Ambulatory Visit: Payer: Self-pay

## 2020-03-20 VITALS — BP 92/57 | HR 99 | Wt 174.0 lb

## 2020-03-20 DIAGNOSIS — M86171 Other acute osteomyelitis, right ankle and foot: Secondary | ICD-10-CM

## 2020-03-20 NOTE — Progress Notes (Signed)
RFV: follow up on right foot dFU/osteo s/p TM and left dFU Patient ID: Chris Bowman, male   DOB: 12/24/1960, 59 y.o.   MRN: [id number]  HPI Chris Bowman is a 59yo M with poorly controlled IDDM, complicated by diabetic foot osteomyelitis s/p right TM and left diabetic foot wound, with MSSA bacteremia. He was discharged on 6 wk of IV abtx with 4/17 end date for cefazolin  Decreased sensation to feet unchanged. Right TM is well healed.  Outpatient Encounter Medications as of 03/20/2020  Medication Sig  . acetaminophen (TYLENOL) 325 MG tablet Take 650 mg by mouth every 6 (six) hours as needed for mild pain.  . Amino Acids-Protein Hydrolys (FEEDING SUPPLEMENT, PRO-STAT SUGAR FREE 64,) LIQD Take 30 mLs by mouth 2 (two) times daily.  . bisacodyl (DULCOLAX) 10 MG suppository Place 1 suppository (10 mg total) rectally daily as needed for moderate constipation.  Marland Kitchen ceFAZolin (ANCEF) IVPB Inject 2 g into the vein every 8 (eight) hours. Indication:  MSSA bacteremia and diabetic foot osteomyelitis Last Day of Therapy:  03/16/2020 Labs - Once weekly:  CBC/D and BMP, Labs - Every other week:  ESR and CRP  . feeding supplement, GLUCERNA SHAKE, (GLUCERNA SHAKE) LIQD Take 237 mLs by mouth 3 (three) times daily between meals.  . ferrous sulfate 325 (65 FE) MG tablet Take 1 tablet (325 mg total) by mouth daily with breakfast.  . insulin aspart protamine- aspart (NOVOLOG MIX 70/30) (70-30) 100 UNIT/ML injection Inject 0.08 mLs (8 Units total) into the skin 2 (two) times daily with a meal.  . tamsulosin (FLOMAX) 0.4 MG CAPS capsule Take 1 capsule (0.4 mg total) by mouth daily.  . vitamin B-12 1000 MCG tablet Take 1 tablet (1,000 mcg total) by mouth daily.  . polyethylene glycol (MIRALAX / GLYCOLAX) 17 g packet Take 17 g by mouth daily as needed for mild constipation. (Patient not taking: Reported on 03/20/2020)   No facility-administered encounter medications on file as of 03/20/2020.     Patient Active Problem  List   Diagnosis Date Noted  . Bacteremia due to methicillin susceptible Staphylococcus aureus (MSSA) 02/10/2020  . Abscess in epidural space of cervical spine 02/10/2020  . AKI (acute kidney injury) ([place])   . Fever   . Osteomyelitis of foot, right, acute ([place])   . Type 2 diabetes mellitus with hyperglycemia, without long-term current use of insulin ([place]) 02/01/2020  . Ulcer of left foot due to type 2 diabetes mellitus ([place]) 02/01/2020  . Hyponatremia 02/01/2020  . Renal insufficiency 02/01/2020  . Weakness 02/01/2020  . Falls frequently 02/01/2020     Health Maintenance Due  Topic Date Due  . PNEUMOCOCCAL POLYSACCHARIDE VACCINE AGE 24-64 HIGH RISK  Never done  . FOOT EXAM  Never done  . OPHTHALMOLOGY EXAM  Never done  . URINE MICROALBUMIN  Never done  . COVID-19 Vaccine (1) Never done  . TETANUS/TDAP  Never done  . COLONOSCOPY  Never done     Review of Systems Review of Systems  Constitutional: Negative for fever, chills, diaphoresis, activity change, appetite change, fatigue and unexpected weight change.  HENT: Negative for congestion, sore throat, rhinorrhea, sneezing, trouble swallowing and sinus pressure.  Eyes: Negative for photophobia and visual disturbance.  Respiratory: Negative for cough, chest tightness, shortness of breath, wheezing and stridor.  Cardiovascular: Negative for chest pain, palpitations and leg swelling.  Gastrointestinal: Negative for nausea, vomiting, abdominal pain, diarrhea, constipation, blood in stool, abdominal distention and anal bleeding.  Genitourinary: Negative for dysuria,  hematuria, flank pain and difficulty urinating.  Musculoskeletal: Negative for myalgias, back pain, joint swelling, arthralgias and gait problem.  Skin: Negative for color change, pallor, rash and wound.  Neurological: Negative for dizziness, tremors, weakness and light-headedness.  Hematological: Negative for adenopathy. Does not bruise/bleed easily.   Psychiatric/Behavioral: Negative for behavioral problems, confusion, sleep disturbance, dysphoric mood, decreased concentration and agitation.    Physical Exam   BP (!) 92/57 (BP Location: Left Arm)   Pulse 99   Wt 174 lb (78.9 kg)   SpO2 99%   BMI 26.46 kg/m   Physical Exam  Constitutional: He is oriented to person, place, and time. He appears well-developed and well-nourished. No distress.  HENT:  Mouth/Throat: Oropharynx is clear and moist. No oropharyngeal exudate.  Cardiovascular: Normal rate, regular rhythm and normal heart sounds. Exam reveals no gallop and no friction rub.  No murmur heard.  Pulmonary/Chest: Effort normal and breath sounds normal. No respiratory distress. He has no wheezes.  Abdominal: Soft. Bowel sounds are normal. He exhibits no distension. There is no tenderness.  Lymphadenopathy:  He has no cervical adenopathy.  Neurological: He is alert and oriented to person, place, and time.  Skin: healed incisions to his feet.  Psychiatric: He has a normal mood and affect. His behavior is normal.    CBC Lab Results  Component Value Date   WBC 7.2 02/29/2020   RBC 4.03 (L) 02/29/2020   HGB 9.2 (L) 02/29/2020   HCT 29.9 (L) 02/29/2020   PLT 274 02/29/2020   MCV 74.2 (L) 02/29/2020   MCH 22.8 (L) 02/29/2020   MCHC 30.8 02/29/2020   RDW 17.4 (H) 02/29/2020   LYMPHSABS 2.4 02/29/2020   MONOABS 0.6 02/29/2020   EOSABS 0.2 02/29/2020    BMET Lab Results  Component Value Date   NA 140 02/29/2020   K 4.1 02/29/2020   CL 104 02/29/2020   CO2 26 02/29/2020   GLUCOSE 161 (H) 02/29/2020   BUN 26 (H) 02/29/2020   CREATININE 1.08 02/29/2020   CALCIUM 9.0 02/29/2020   GFRNONAA >60 02/29/2020   GFRAA >60 02/29/2020    . Lab Results  Component Value Date   ESRSEDRATE 35 (H) 03/20/2020   Lab Results  Component Value Date   CRP 12.0 (H) 03/20/2020     Assessment and Plan  diabetic foot osteomyelitis =  Finish out the 4 wk of oral cephalexin for  chronic osteo/dfu Will check sed rate and crp today Can use lotion to dry cracked skin areas, incision looks well healed

## 2020-03-21 LAB — SEDIMENTATION RATE: Sed Rate: 53 mm/h — ABNORMAL HIGH (ref 0–20)

## 2020-03-21 LAB — C-REACTIVE PROTEIN: CRP: 12 mg/L — ABNORMAL HIGH (ref <8.0)

## 2020-05-05 ENCOUNTER — Other Ambulatory Visit: Payer: Self-pay

## 2020-05-05 ENCOUNTER — Emergency Department (HOSPITAL_COMMUNITY)
Admission: EM | Admit: 2020-05-05 | Discharge: 2020-05-05 | Disposition: A | Discharge status: Home / Self Care | Payer: No Typology Code available for payment source | Attending: Emergency Medicine | Admitting: Emergency Medicine

## 2020-05-05 ENCOUNTER — Encounter (HOSPITAL_COMMUNITY): Payer: Self-pay | Admitting: Emergency Medicine

## 2020-05-05 ENCOUNTER — Emergency Department (HOSPITAL_COMMUNITY): Payer: No Typology Code available for payment source

## 2020-05-05 DIAGNOSIS — K5900 Constipation, unspecified: Secondary | ICD-10-CM | POA: Diagnosis present

## 2020-05-05 DIAGNOSIS — Z794 Long term (current) use of insulin: Secondary | ICD-10-CM | POA: Insufficient documentation

## 2020-05-05 DIAGNOSIS — Z79899 Other long term (current) drug therapy: Secondary | ICD-10-CM | POA: Insufficient documentation

## 2020-05-05 DIAGNOSIS — E119 Type 2 diabetes mellitus without complications: Secondary | ICD-10-CM | POA: Diagnosis not present

## 2020-05-05 DIAGNOSIS — E860 Dehydration: Secondary | ICD-10-CM | POA: Diagnosis not present

## 2020-05-05 DIAGNOSIS — R110 Nausea: Secondary | ICD-10-CM | POA: Insufficient documentation

## 2020-05-05 DIAGNOSIS — R7989 Other specified abnormal findings of blood chemistry: Secondary | ICD-10-CM | POA: Diagnosis not present

## 2020-05-05 LAB — URINALYSIS, ROUTINE W REFLEX MICROSCOPIC
Bilirubin Urine: NEGATIVE
Glucose, UA: NEGATIVE mg/dL
Hgb urine dipstick: NEGATIVE
Ketones, ur: NEGATIVE mg/dL
Leukocytes,Ua: NEGATIVE
Nitrite: NEGATIVE
Protein, ur: NEGATIVE mg/dL
Specific Gravity, Urine: 1.008 (ref 1.005–1.030)
pH: 5 (ref 5.0–8.0)

## 2020-05-05 LAB — COMPREHENSIVE METABOLIC PANEL
ALT: 23 U/L (ref 0–44)
AST: 23 U/L (ref 15–41)
Albumin: 3.5 g/dL (ref 3.5–5.0)
Alkaline Phosphatase: 71 U/L (ref 38–126)
Anion gap: 15 (ref 5–15)
BUN: 59 mg/dL — ABNORMAL HIGH (ref 6–20)
CO2: 17 mmol/L — ABNORMAL LOW (ref 22–32)
Calcium: 9.2 mg/dL (ref 8.9–10.3)
Chloride: 100 mmol/L (ref 98–111)
Creatinine, Ser: 1.42 mg/dL — ABNORMAL HIGH (ref 0.61–1.24)
GFR calc Af Amer: >60 mL/min (ref >60)
GFR calc non Af Amer: 54 mL/min — ABNORMAL LOW (ref >60)
Glucose, Bld: 170 mg/dL — ABNORMAL HIGH (ref 70–99)
Potassium: 5.1 mmol/L (ref 3.5–5.1)
Sodium: 132 mmol/L — ABNORMAL LOW (ref 135–145)
Total Bilirubin: 0.8 mg/dL (ref 0.3–1.2)
Total Protein: 7.4 g/dL (ref 6.5–8.1)

## 2020-05-05 LAB — CBC
HCT: 38.7 % — ABNORMAL LOW (ref 39.0–52.0)
Hemoglobin: 12.7 g/dL — ABNORMAL LOW (ref 13.0–17.0)
MCH: 26 pg (ref 26.0–34.0)
MCHC: 32.8 g/dL (ref 30.0–36.0)
MCV: 79.3 fL — ABNORMAL LOW (ref 80.0–100.0)
Platelets: 238 10*3/uL (ref 150–400)
RBC: 4.88 MIL/uL (ref 4.22–5.81)
RDW: 17.2 % — ABNORMAL HIGH (ref 11.5–15.5)
WBC: 11.5 10*3/uL — ABNORMAL HIGH (ref 4.0–10.5)
nRBC: 0 % (ref 0.0–0.2)

## 2020-05-05 LAB — CBG MONITORING, ED: Glucose-Capillary: 153 mg/dL — ABNORMAL HIGH (ref 70–99)

## 2020-05-05 LAB — LIPASE, BLOOD: Lipase: 28 U/L (ref 11–51)

## 2020-05-05 IMAGING — CR DG ABDOMEN ACUTE W/ 1V CHEST
3 series · 3 of 3 positions shown · non-contrast
Comparison: Non chest radiograph [DATE] e

CLINICAL DATA: Constipation for 1 week, last bowel movement 2 days
ago

EXAM:
DG ABDOMEN ACUTE W/ 1V CHEST

[chest pa]
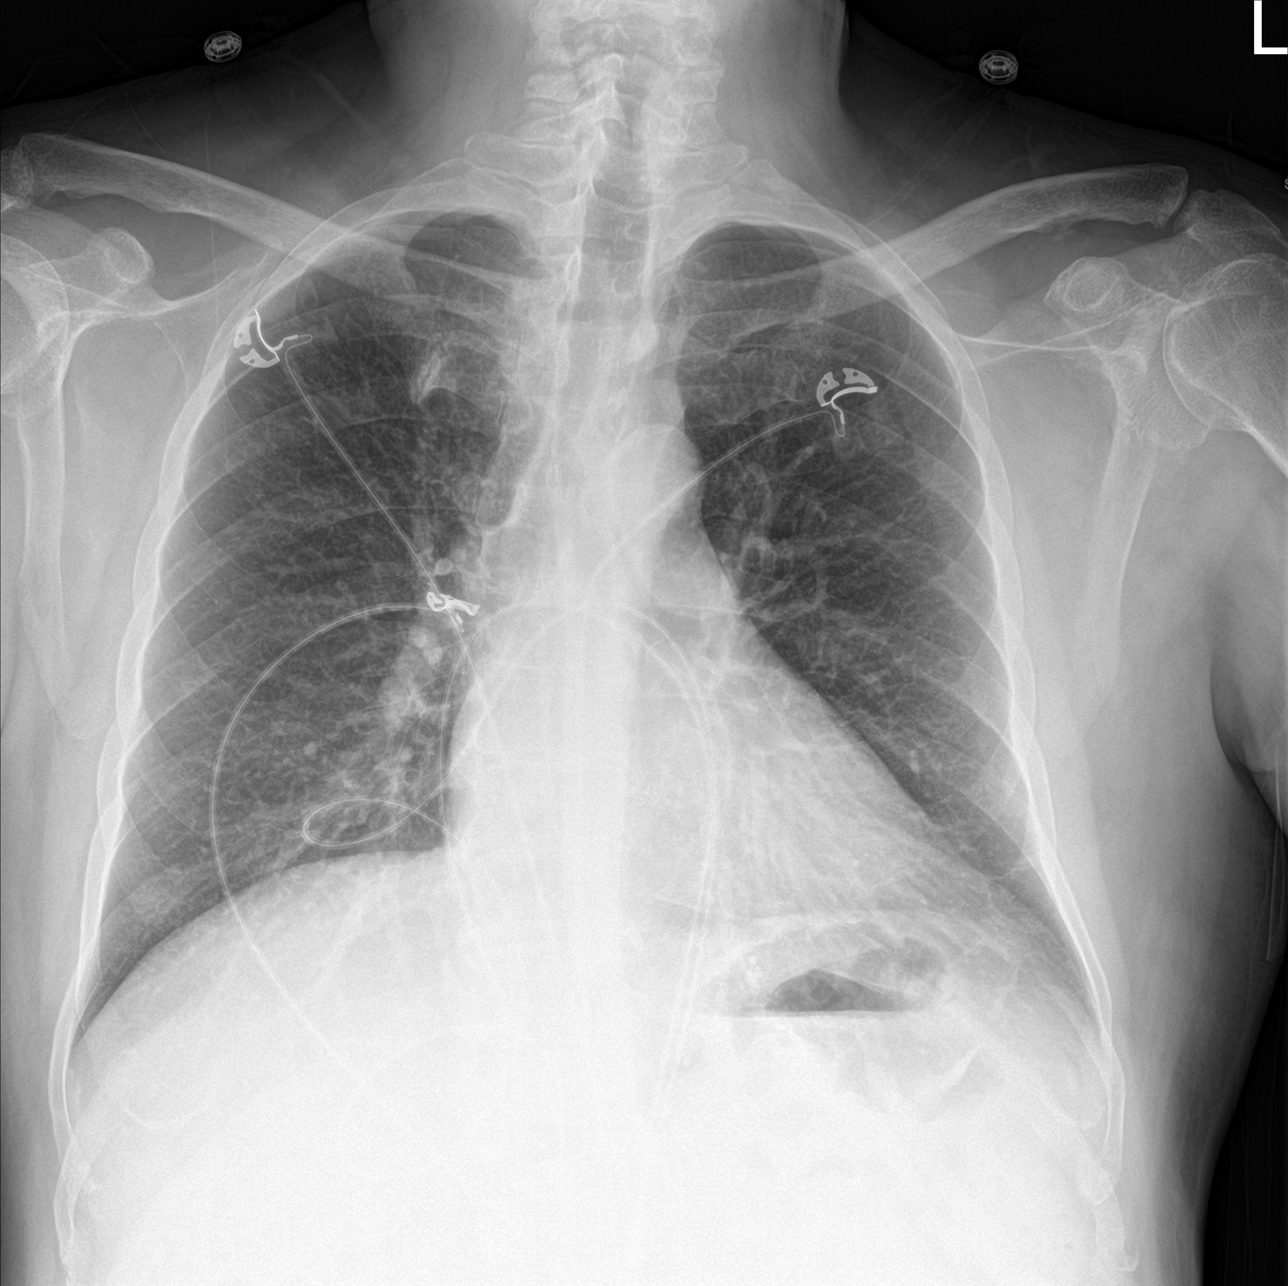

[abdomen erect]
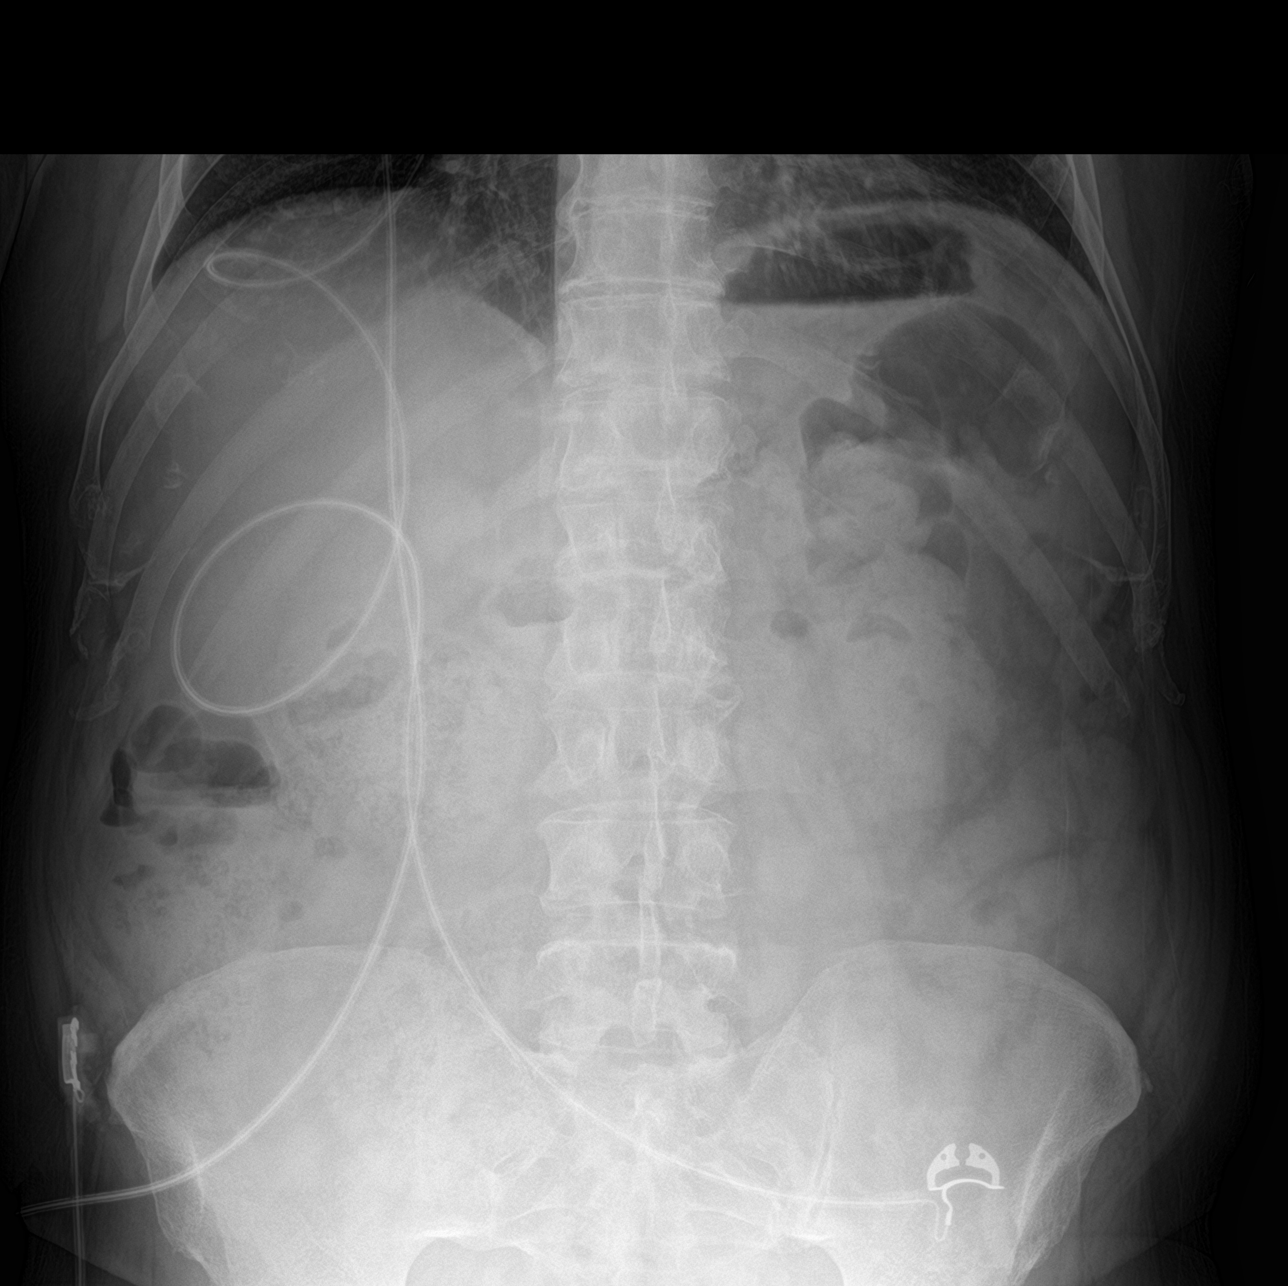

[abdomen supine]
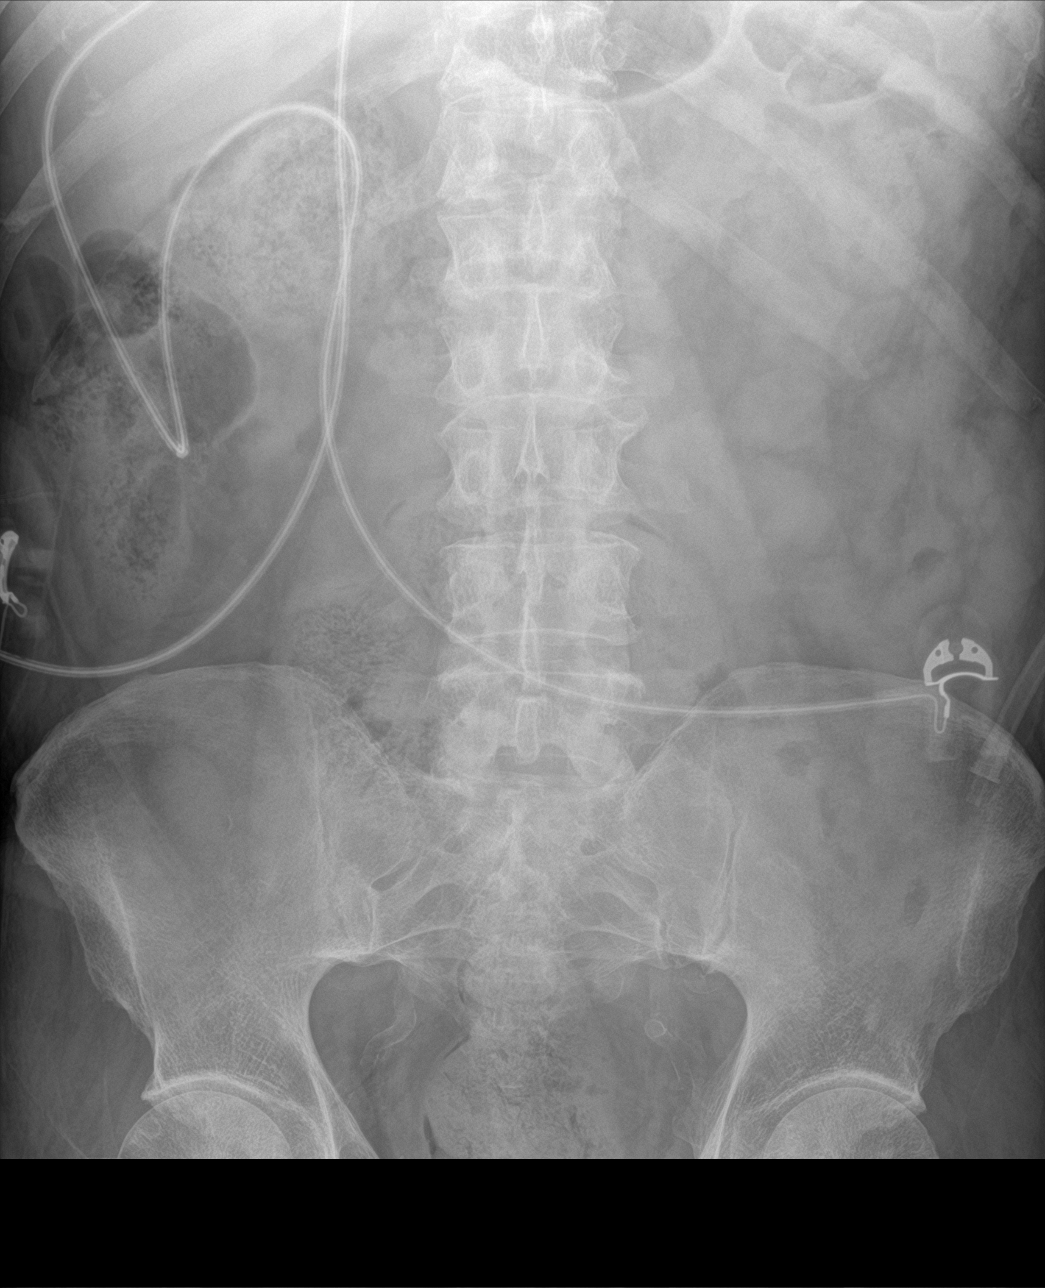

[3 of 3 positions shown; findings below may reference images not displayed]

FINDINGS: Normal heart size, mediastinal contours, and pulmonary vascularity.

Lungs clear.

No infiltrate, pleural effusion or pneumothorax.

Scattered upper normal stool burden in colon with stool present to
rectum.

No bowel dilatation or bowel wall thickening or free air.

At osseous structures unremarkable.

No urinary tract calcification.

Atherosclerotic calcifications in pelvis.
IMPRESSION: No acute abnormalities.

## 2020-05-05 MED ORDER — SODIUM CHLORIDE 0.9% FLUSH: 3.0000 mL | Freq: Once | INTRAVENOUS | Status: DC

## 2020-05-05 MED ORDER — SODIUM CHLORIDE 0.9 % IV BOLUS
1000.0000 mL | Freq: Once | INTRAVENOUS | Status: AC
  Administered 2020-05-05: 1000 mL via INTRAVENOUS

## 2020-05-05 MED ORDER — MILK AND MOLASSES ENEMA
1.0000 | Freq: Once | RECTAL | Status: DC
  Filled 2020-05-05: qty 240

## 2020-05-05 NOTE — ED Notes (Signed)
Pt family member had questions. Answers provided by this RN. [place] time given for any more questions comments or concerns. Family verbalized they did not have any at this time.

## 2020-05-05 NOTE — ED Triage Notes (Signed)
C/o constipation x 1 week.  Denies abd pain.  Last BM 2 days ago.

## 2020-05-05 NOTE — ED Notes (Signed)
Pt transported to Xray. 

## 2020-05-05 NOTE — ED Provider Notes (Signed)
[name] EMERGENCY DEPARTMENT Provider Note   CSN: [id number] Arrival date & time: 05/05/20  2355     History Chief Complaint  Patient presents with  . Constipation    Chris Bowman is a 59 y.o. male with PMHx HTN and diabetes who presents to the ED today with complaint of intermittent constipation x 2-3 days. Pt reports his last BM was earlier today around 6 AM however it was very small pelleted stools. He attempt multiple doses of miralax and warm water into his rectum without relief. He called his home health nursing line and was advised to come to the ED. Pt is unsure if he is passing gas however states he is still burping. He has felt nauseated but no vomiting. He is not having any abdominal pain. Denies fevers or chills. No previous abdominal surgeries.   The history is provided by the patient and medical records.       Past Medical History:  Diagnosis Date  . Delusional disorder (HCC)   . Essential hypertension   . Uncontrolled type 2 diabetes mellitus [facility])     Patient Active Problem List   Diagnosis Date Noted  . Bacteremia due to methicillin susceptible Staphylococcus aureus (MSSA) 02/10/2020  . Abscess in epidural space of cervical spine 02/10/2020  . AKI (acute kidney injury) (H[facility])   . Fever   . Osteomyelitis of foot, right, acute (HCC)   . Type 2 diabetes mellitus with hyperglycemia, without long-term current use of insulin (HCC) 02/01/2020  . Ulcer of left foot due to type 2 diabetes mellitus (HCC) 02/01/2020  . Hyponatremia 02/01/2020  . Renal insufficiency 02/01/2020  . Weakness 02/01/2020  . Falls frequently 02/01/2020    Past Surgical History:  Procedure Laterality Date  . AMPUTATION Right 02/03/2020   Procedure: AMPUTATION RAY, transmetatarsal of right foot;  Surgeon: Nadara Mustard, MD;  Location: [facility] OR;  Service: Orthopedics;  Laterality: Right;  . I & D EXTREMITY Left 02/03/2020   Procedure: IRRIGATION AND DEBRIDEMENT EXTREMITY,  left great toe;  Surgeon: Nadara Mustard, MD;  Location: [facility] OR;  Service: Orthopedics;  Laterality: Left;  . TEE WITHOUT CARDIOVERSION N/A 02/05/2020   Procedure: TRANSESOPHAGEAL ECHOCARDIOGRAM (TEE);  Surgeon: Parke Poisson, MD;  Location: [facility] ENDOSCOPY;  Service: Cardiology;  Laterality: N/A;  . TONSILLECTOMY         Family History  Problem Relation Age of Onset  . Diabetes Mother     Social History   Tobacco Use  . Smoking status: Never Smoker  . Smokeless tobacco: Never Used  Substance Use Topics  . Alcohol use: Never  . Drug use: Never    Home Medications Prior to Admission medications   Medication Sig Start Date End Date Taking? Authorizing Provider  acetaminophen (TYLENOL) 325 MG tablet Take 650 mg by mouth every 6 (six) hours as needed for mild pain.   Yes [provider]  Amino Acids-Protein Hydrolys (FEEDING SUPPLEMENT, PRO-STAT SUGAR FREE 64,) LIQD Take 30 mLs by mouth 2 (two) times daily. 03/01/20  Yes Lanae Boast, MD  feeding supplement, GLUCERNA SHAKE, (GLUCERNA SHAKE) LIQD Take 237 mLs by mouth 3 (three) times daily between meals. 03/01/20  Yes Lanae Boast, MD  ferrous sulfate 325 (65 FE) MG tablet Take 1 tablet (325 mg total) by mouth daily with breakfast. 03/02/20  Yes Kc, Ramesh, MD  insulin aspart protamine- aspart (NOVOLOG MIX 70/30) (70-30) 100 UNIT/ML injection Inject 0.08 mLs (8 Units total) into the skin 2 (two) times  daily with a meal. 03/01/20  Yes Kc, Ramesh, MD  polyethylene glycol (MIRALAX / GLYCOLAX) 17 g packet Take 17 g by mouth daily as needed for mild constipation. 03/01/20  Yes Lanae Boast, MD  tamsulosin (FLOMAX) 0.4 MG CAPS capsule Take 1 capsule (0.4 mg total) by mouth daily. 03/02/20  Yes Lanae Boast, MD  vitamin B-12 1000 MCG tablet Take 1 tablet (1,000 mcg total) by mouth daily. 03/02/20  Yes Lanae Boast, MD  bisacodyl (DULCOLAX) 10 MG suppository Place 1 suppository (10 mg total) rectally daily as needed for moderate constipation. Patient not  taking: Reported on 05/05/2020 03/01/20   Lanae Boast, MD    Allergies    Codeine  Review of Systems   Review of Systems  Constitutional: Negative for chills and fever.  Gastrointestinal: Positive for constipation and nausea. Negative for abdominal pain, diarrhea and vomiting.  All other systems reviewed and are negative.   Physical Exam Updated Vital Signs BP 137/73 (BP Location: Left Arm)   Pulse (!) 108   Temp 98.2 F (36.8 C) (Oral)   Resp 20   Ht 5\' 7"  (1.702 m)   Wt 79.8 kg   SpO2 93%   BMI 27.57 kg/m   Physical Exam Vitals and nursing note reviewed.  Constitutional:      Appearance: He is not ill-appearing or diaphoretic.  HENT:     Head: Normocephalic and atraumatic.  Eyes:     Conjunctiva/sclera: Conjunctivae normal.  Cardiovascular:     Rate and Rhythm: Normal rate and regular rhythm.  Pulmonary:     Effort: Pulmonary effort is normal.     Breath sounds: Normal breath sounds. No wheezing, rhonchi or rales.  Abdominal:     Palpations: Abdomen is soft.     Tenderness: There is no abdominal tenderness. There is no guarding or rebound.  Musculoskeletal:     Cervical back: Neck supple.  Skin:    General: Skin is warm and dry.  Neurological:     Mental Status: He is alert.     ED Results / Procedures / Treatments   Labs (all labs ordered are listed, but only abnormal results are displayed) Labs Reviewed  COMPREHENSIVE METABOLIC PANEL - Abnormal; Notable for the following components:      Result Value   Sodium 132 (*)    CO2 17 (*)    Glucose, Bld 170 (*)    BUN 59 (*)    Creatinine, Ser 1.42 (*)    GFR calc non Af Amer 54 (*)    All other components within normal limits  CBC - Abnormal; Notable for the following components:   WBC 11.5 (*)    Hemoglobin 12.7 (*)    HCT 38.7 (*)    MCV 79.3 (*)    RDW 17.2 (*)    All other components within normal limits  URINALYSIS, ROUTINE W REFLEX MICROSCOPIC - Abnormal; Notable for the following components:    Color, Urine STRAW (*)    All other components within normal limits  CBG MONITORING, ED - Abnormal; Notable for the following components:   Glucose-Capillary 153 (*)    All other components within normal limits  LIPASE, BLOOD    EKG None  Radiology DG Abd Acute W/Chest  Result Date: 05/05/2020 CLINICAL DATA:  Constipation for 1 week, last bowel movement 2 days ago EXAM: DG ABDOMEN ACUTE W/ 1V CHEST COMPARISON:  Non chest radiograph 02/01/2020 e FINDINGS: Normal heart size, mediastinal contours, and pulmonary vascularity. Lungs clear. No infiltrate,  pleural effusion or pneumothorax. Scattered upper normal stool burden in colon with stool present to rectum. No bowel dilatation or bowel wall thickening or free air. At osseous structures unremarkable. No urinary tract calcification. Atherosclerotic calcifications in pelvis. IMPRESSION: No acute abnormalities. Electronically Signed   By: Ulyses Southward M.D.   On: 05/05/2020 10:47    Procedures Procedures (including critical care time)  Medications Ordered in ED Medications  sodium chloride flush (NS) 0.9 % injection 3 mL (has no administration in time range)  sodium chloride 0.9 % bolus 1,000 mL (0 mLs Intravenous Stopped 05/05/20 1220)    ED Course  I have reviewed the triage vital signs and the nursing notes.  Pertinent labs & imaging results that were available during my care of the patient were reviewed by me and considered in my medical decision making (see chart for details).    MDM Rules/Calculators/A&P                      59 year old male who presents to the ED today complaining of constipation for the past 2 days however he does state that he had a bowel movement earlier this morning.  Said it was smaller than normal.  Having some nausea.  Initially states he is not sure if he is passing gas however then states he is "dry heaving" from his rectum and having gas from above.  X-ray was obtained without any findings of SBO.  Previous  abdominal surgeries and I am very low suspicion for this today.  No abdominal tenderness on exam.  Will work-up with labs at this time and provide enema as needed.   CBC with mild elevation in leukocytosis 11.5, infectious etiology.  His hemoglobin is improved from baseline, he is currently on iron supplementation which I suspect is attributing to his constipation.\ CMP with sodium 132, glucose 170, bicarb of 70, elevated creatinine 1.42 and BUN 59, consistent with dehydration.  Fluids provided.  Lipase within normal limits.  UA without signs of infection.  An enema was ordered however prior to patient receiving this he had a large bowel movement in the ED.  He did states he took MiraLAX earlier today without any improvement, suspect it may have taken a couple hours to work properly.  Patient provided fluids and states he feels more comfortable.  He continues to have a soft abdomen.  Will discharge at this time with PCP follow-up.  He is encouraged to increase his water intake have his creatinine level rechecked in 1 week.  Strict return precautions have been discussed.  Patient is in agreement with plan and stable for discharge home.   This note was prepared using Dragon voice recognition software and may include unintentional dictation errors due to the inherent limitations of voice recognition software.  Final Clinical Impression(s) / ED Diagnoses Final diagnoses:  Constipation, unspecified constipation type  Elevated serum creatinine  Dehydration    Rx / DC Orders ED Discharge Orders    None       Discharge Instructions     Please follow up with your PCP regarding your ED visit today - drink plenty of fluids to stay hydrated. You did appear dehydrated today which was shown on your kidney function (creatinine). You will need to have this lab rechecked by your PCP in 1-2 weeks Continue taking Miralax daily to help with your constipation symptoms Return to the ED for any worsening  symptoms        Tanda Rockers, PA-C  05/05/20 [place]a Skye, DO 05/05/20 1540

## 2020-05-05 NOTE — Discharge Instructions (Addendum)
Please follow up with your PCP regarding your ED visit today - drink plenty of fluids to stay hydrated. You did appear dehydrated today which was shown on your kidney function (creatinine). You will need to have this lab rechecked by your PCP in 1-2 weeks Continue taking Miralax daily to help with your constipation symptoms Return to the ED for any worsening symptoms

## 2020-05-06 ENCOUNTER — Ambulatory Visit: Payer: No Typology Code available for payment source | Admitting: Internal Medicine

## 2020-05-22 ENCOUNTER — Encounter: Payer: Self-pay | Admitting: Internal Medicine

## 2020-05-22 ENCOUNTER — Other Ambulatory Visit: Payer: Self-pay

## 2020-05-22 ENCOUNTER — Ambulatory Visit (INDEPENDENT_AMBULATORY_CARE_PROVIDER_SITE_OTHER): Payer: No Typology Code available for payment source | Admitting: Internal Medicine

## 2020-05-22 VITALS — BP 116/68 | HR 86 | Temp 97.8°F | Wt 175.0 lb

## 2020-05-22 DIAGNOSIS — M86171 Other acute osteomyelitis, right ankle and foot: Secondary | ICD-10-CM | POA: Diagnosis not present

## 2020-05-22 LAB — SEDIMENTATION RATE: Sed Rate: 36 mm/h — ABNORMAL HIGH (ref 0–20)

## 2020-05-22 NOTE — Progress Notes (Signed)
RFV: follow up for hospitalization for DFU and bacteremia  Patient ID: Chris Bowman, male   DOB: 1961/06/04, 59 y.o.   MRN: [id number]  HPI Chris Bowman is a 59yo M with poorly controlled IDDM, complicated by diabetic foot osteomyelitis s/p right TM and left diabetic foot wound, with MSSA bacteremia. He was discharged on 6 wk of IV abtx with4/17 end date for cefazolin.  [facility]- for management of DM  Has a home healthcare RN from [place] checking in on him as well.  Outpatient Encounter Medications as of 05/22/2020  Medication Sig  . acetaminophen (TYLENOL) 325 MG tablet Take 650 mg by mouth every 6 (six) hours as needed for mild pain.  . Amino Acids-Protein Hydrolys (FEEDING SUPPLEMENT, PRO-STAT SUGAR FREE 64,) LIQD Take 30 mLs by mouth 2 (two) times daily.  . bisacodyl (DULCOLAX) 10 MG suppository Place 1 suppository (10 mg total) rectally daily as needed for moderate constipation.  . feeding supplement, GLUCERNA SHAKE, (GLUCERNA SHAKE) LIQD Take 237 mLs by mouth 3 (three) times daily between meals.  . ferrous sulfate 325 (65 FE) MG tablet Take 1 tablet (325 mg total) by mouth daily with breakfast.  . insulin aspart protamine- aspart (NOVOLOG MIX 70/30) (70-30) 100 UNIT/ML injection Inject 0.08 mLs (8 Units total) into the skin 2 (two) times daily with a meal.  . polyethylene glycol (MIRALAX / GLYCOLAX) 17 g packet Take 17 g by mouth daily as needed for mild constipation.  . tamsulosin (FLOMAX) 0.4 MG CAPS capsule Take 1 capsule (0.4 mg total) by mouth daily.  . vitamin B-12 1000 MCG tablet Take 1 tablet (1,000 mcg total) by mouth daily.   No facility-administered encounter medications on file as of 05/22/2020.     Patient Active Problem List   Diagnosis Date Noted  . Bacteremia due to methicillin susceptible Staphylococcus aureus (MSSA) 02/10/2020  . Abscess in epidural space of cervical spine 02/10/2020  . AKI (acute kidney injury) ([place])   .  Fever   . Osteomyelitis of foot, right, acute ([place])   . Type 2 diabetes mellitus with hyperglycemia, without long-term current use of insulin ([place]) 02/01/2020  . Ulcer of left foot due to type 2 diabetes mellitus ([place]) 02/01/2020  . Hyponatremia 02/01/2020  . Renal insufficiency 02/01/2020  . Weakness 02/01/2020  . Falls frequently 02/01/2020     Health Maintenance Due  Topic Date Due  . PNEUMOCOCCAL POLYSACCHARIDE VACCINE AGE 34-64 HIGH RISK  Never done  . FOOT EXAM  Never done  . OPHTHALMOLOGY EXAM  Never done  . URINE MICROALBUMIN  Never done  . COVID-19 Vaccine (1) Never done  . TETANUS/TDAP  Never done  . COLONOSCOPY  Never done    Social History   Tobacco Use  . Smoking status: Never Smoker  . Smokeless tobacco: Never Used  Vaping Use  . Vaping Use: Never used  Substance Use Topics  . Alcohol use: Never  . Drug use: Never    Review of Systems Review of Systems  Constitutional: Negative for fever, chills, diaphoresis, activity change, appetite change, fatigue and unexpected weight change.  HENT: Negative for congestion, sore throat, rhinorrhea, sneezing, trouble swallowing and sinus pressure.  Eyes: Negative for photophobia and visual disturbance.  Respiratory: Negative for cough, chest tightness, shortness of breath, wheezing and stridor.  Cardiovascular: Negative for chest pain, palpitations and leg swelling.  Gastrointestinal: Negative for nausea, vomiting, abdominal pain, diarrhea, constipation, blood in stool, abdominal distention and anal bleeding.  Genitourinary: Negative for dysuria, hematuria, flank pain and difficulty urinating.  Musculoskeletal: Negative for myalgias, back pain, joint swelling, arthralgias and gait problem.  Skin: Negative for color change, pallor, rash and wound.  Neurological: Negative for dizziness, tremors, weakness and light-headedness.  Hematological: Negative for adenopathy. Does not bruise/bleed easily.  Psychiatric/Behavioral:  Negative for behavioral problems, confusion, sleep disturbance, dysphoric mood, decreased concentration and agitation.    Physical Exam   Wt 175 lb (79.4 kg)   BMI 27.41 kg/m    Right foot TM = well healed. Slight hyperkeratosis on lateral aspect of incision. No ulcers Left foot DM = well healed as well. Has blood tinged 2nd nail bed (injured yesterday) CBC Lab Results  Component Value Date   WBC 11.5 (H) 05/05/2020   RBC 4.88 05/05/2020   HGB 12.7 (L) 05/05/2020   HCT 38.7 (L) 05/05/2020   PLT 238 05/05/2020   MCV 79.3 (L) 05/05/2020   MCH 26.0 05/05/2020   MCHC 32.8 05/05/2020   RDW 17.2 (H) 05/05/2020   LYMPHSABS 2.4 02/29/2020   MONOABS 0.6 02/29/2020   EOSABS 0.2 02/29/2020    BMET Lab Results  Component Value Date   NA 132 (L) 05/05/2020   K 5.1 05/05/2020   CL 100 05/05/2020   CO2 17 (L) 05/05/2020   GLUCOSE 170 (H) 05/05/2020   BUN 59 (H) 05/05/2020   CREATININE 1.42 (H) 05/05/2020   CALCIUM 9.2 05/05/2020   GFRNONAA 54 (L) 05/05/2020   GFRAA >60 05/05/2020   Lab Results  Component Value Date   ESRSEDRATE 53 (H) 03/20/2020   Lab Results  Component Value Date   ESRSEDRATE 36 (H) 05/22/2020     Assessment and Plan Appears clinically improved on physical exam. Will check sed rate to see that it is improved.  Inflammatory markers trending down.recommend 1 more month of oral abtx  rtc as needed

## 2020-10-23 ENCOUNTER — Telehealth: Payer: Self-pay | Admitting: Physician Assistant

## 2020-10-23 NOTE — Telephone Encounter (Signed)
Called to discuss the homebound Covid-19 vaccination initiative with the patient and/or caregiver.   Pt is not truly homebound and will try to get his vaccine set up with the [place].  Cline Crock PA-C  [facility]
